# Patient Record
Sex: Female | Born: 1935 | Race: Black or African American | Hispanic: No | Smoking: Former smoker
Health system: Southern US, Community
[De-identification: ages and names within clinical notes are randomized; demographics above are authoritative.]

## PROBLEM LIST (undated history)

## (undated) DIAGNOSIS — I739 Peripheral vascular disease, unspecified: Secondary | ICD-10-CM

## (undated) DIAGNOSIS — E785 Hyperlipidemia, unspecified: Secondary | ICD-10-CM

## (undated) DIAGNOSIS — O288 Other abnormal findings on antenatal screening of mother: Secondary | ICD-10-CM

## (undated) DIAGNOSIS — C50911 Malignant neoplasm of unspecified site of right female breast: Secondary | ICD-10-CM

## (undated) DIAGNOSIS — E119 Type 2 diabetes mellitus without complications: Secondary | ICD-10-CM

## (undated) DIAGNOSIS — K219 Gastro-esophageal reflux disease without esophagitis: Secondary | ICD-10-CM

## (undated) DIAGNOSIS — H9190 Unspecified hearing loss, unspecified ear: Secondary | ICD-10-CM

## (undated) DIAGNOSIS — Z9989 Dependence on other enabling machines and devices: Secondary | ICD-10-CM

## (undated) DIAGNOSIS — M1990 Unspecified osteoarthritis, unspecified site: Secondary | ICD-10-CM

## (undated) DIAGNOSIS — I10 Essential (primary) hypertension: Secondary | ICD-10-CM

## (undated) DIAGNOSIS — Z720 Tobacco use: Secondary | ICD-10-CM

## (undated) DIAGNOSIS — G4733 Obstructive sleep apnea (adult) (pediatric): Secondary | ICD-10-CM

## (undated) DIAGNOSIS — R413 Other amnesia: Secondary | ICD-10-CM

## (undated) HISTORY — DX: Hyperlipidemia, unspecified: E78.5

## (undated) HISTORY — DX: Peripheral vascular disease, unspecified: I73.9

## (undated) HISTORY — DX: Other amnesia: R41.3

## (undated) HISTORY — PX: JOINT REPLACEMENT: SHX530

## (undated) HISTORY — PX: CATARACT EXTRACTION, BILATERAL: SHX1313

## (undated) HISTORY — DX: Tobacco use: Z72.0

## (undated) HISTORY — PX: APPENDECTOMY: SHX54

## (undated) HISTORY — DX: Other abnormal findings on antenatal screening of mother: O28.8

## (undated) HISTORY — DX: Unspecified hearing loss, unspecified ear: H91.90

---

## 1989-03-07 DIAGNOSIS — C50911 Malignant neoplasm of unspecified site of right female breast: Secondary | ICD-10-CM

## 1989-03-07 HISTORY — PX: BREAST BIOPSY: SHX20

## 1989-03-07 HISTORY — PX: MASTECTOMY PARTIAL / LUMPECTOMY W/ AXILLARY LYMPHADENECTOMY: SUR852

## 1989-03-07 HISTORY — DX: Malignant neoplasm of unspecified site of right female breast: C50.911

## 1997-09-10 ENCOUNTER — Encounter: Admission: RE | Admit: 1997-09-10 | Discharge: 1997-12-09 | Payer: Self-pay | Admitting: General Surgery

## 1997-09-22 ENCOUNTER — Emergency Department (HOSPITAL_COMMUNITY): Admission: EM | Admit: 1997-09-22 | Discharge: 1997-09-22 | Payer: Self-pay | Admitting: Emergency Medicine

## 1997-10-24 ENCOUNTER — Ambulatory Visit (HOSPITAL_COMMUNITY): Admission: RE | Admit: 1997-10-24 | Discharge: 1997-10-24 | Payer: Self-pay | Admitting: *Deleted

## 1997-12-15 ENCOUNTER — Ambulatory Visit (HOSPITAL_COMMUNITY): Admission: RE | Admit: 1997-12-15 | Discharge: 1997-12-15 | Payer: Self-pay | Admitting: General Surgery

## 1997-12-17 ENCOUNTER — Encounter: Admission: RE | Admit: 1997-12-17 | Discharge: 1998-03-17 | Payer: Self-pay

## 1998-12-30 ENCOUNTER — Ambulatory Visit (HOSPITAL_COMMUNITY): Admission: RE | Admit: 1998-12-30 | Discharge: 1998-12-30 | Payer: Self-pay | Admitting: *Deleted

## 1998-12-30 ENCOUNTER — Encounter: Payer: Self-pay | Admitting: *Deleted

## 1999-03-04 ENCOUNTER — Encounter: Payer: Self-pay | Admitting: Urology

## 1999-03-04 ENCOUNTER — Encounter: Admission: RE | Admit: 1999-03-04 | Discharge: 1999-03-04 | Payer: Self-pay | Admitting: Urology

## 1999-06-24 ENCOUNTER — Ambulatory Visit (HOSPITAL_COMMUNITY): Admission: RE | Admit: 1999-06-24 | Discharge: 1999-06-24 | Payer: Self-pay | Admitting: *Deleted

## 1999-06-24 ENCOUNTER — Encounter: Payer: Self-pay | Admitting: *Deleted

## 1999-11-15 ENCOUNTER — Encounter: Admission: RE | Admit: 1999-11-15 | Discharge: 2000-02-13 | Payer: Self-pay | Admitting: Internal Medicine

## 2000-04-17 ENCOUNTER — Encounter: Admission: RE | Admit: 2000-04-17 | Discharge: 2000-07-16 | Payer: Self-pay | Admitting: Internal Medicine

## 2000-07-14 ENCOUNTER — Encounter (INDEPENDENT_AMBULATORY_CARE_PROVIDER_SITE_OTHER): Payer: Self-pay | Admitting: *Deleted

## 2000-07-14 ENCOUNTER — Ambulatory Visit (HOSPITAL_COMMUNITY): Admission: RE | Admit: 2000-07-14 | Discharge: 2000-07-14 | Payer: Self-pay | Admitting: Gastroenterology

## 2001-04-02 ENCOUNTER — Encounter: Admission: RE | Admit: 2001-04-02 | Discharge: 2001-04-02 | Payer: Self-pay | Admitting: Internal Medicine

## 2001-04-02 ENCOUNTER — Encounter: Payer: Self-pay | Admitting: Internal Medicine

## 2001-12-13 ENCOUNTER — Ambulatory Visit (HOSPITAL_COMMUNITY): Admission: RE | Admit: 2001-12-13 | Discharge: 2001-12-13 | Payer: Self-pay | Admitting: Internal Medicine

## 2001-12-13 ENCOUNTER — Encounter: Payer: Self-pay | Admitting: Internal Medicine

## 2002-11-15 ENCOUNTER — Encounter: Payer: Self-pay | Admitting: Emergency Medicine

## 2002-11-16 ENCOUNTER — Observation Stay (HOSPITAL_COMMUNITY): Admission: EM | Admit: 2002-11-16 | Discharge: 2002-11-16 | Payer: Self-pay | Admitting: Emergency Medicine

## 2003-05-12 ENCOUNTER — Encounter: Admission: RE | Admit: 2003-05-12 | Discharge: 2003-05-12 | Payer: Self-pay | Admitting: General Surgery

## 2004-05-22 ENCOUNTER — Emergency Department (HOSPITAL_COMMUNITY): Admission: EM | Admit: 2004-05-22 | Discharge: 2004-05-22 | Payer: Self-pay | Admitting: Emergency Medicine

## 2004-05-25 ENCOUNTER — Emergency Department (HOSPITAL_COMMUNITY): Admission: EM | Admit: 2004-05-25 | Discharge: 2004-05-25 | Payer: Self-pay | Admitting: Emergency Medicine

## 2005-03-11 ENCOUNTER — Encounter: Admission: RE | Admit: 2005-03-11 | Discharge: 2005-03-11 | Payer: Self-pay | Admitting: Internal Medicine

## 2005-06-21 ENCOUNTER — Encounter: Admission: RE | Admit: 2005-06-21 | Discharge: 2005-06-21 | Payer: Self-pay | Admitting: General Surgery

## 2005-11-29 ENCOUNTER — Encounter: Admission: RE | Admit: 2005-11-29 | Discharge: 2005-11-29 | Payer: Self-pay

## 2006-07-25 ENCOUNTER — Ambulatory Visit: Payer: Self-pay | Admitting: Vascular Surgery

## 2007-01-02 ENCOUNTER — Encounter: Admission: RE | Admit: 2007-01-02 | Discharge: 2007-01-02 | Payer: Self-pay | Admitting: Internal Medicine

## 2007-02-10 ENCOUNTER — Emergency Department (HOSPITAL_COMMUNITY): Admission: EM | Admit: 2007-02-10 | Discharge: 2007-02-10 | Payer: Self-pay | Admitting: Family Medicine

## 2007-09-01 ENCOUNTER — Emergency Department (HOSPITAL_COMMUNITY): Admission: EM | Admit: 2007-09-01 | Discharge: 2007-09-01 | Payer: Self-pay | Admitting: Emergency Medicine

## 2007-09-15 ENCOUNTER — Emergency Department (HOSPITAL_COMMUNITY): Admission: EM | Admit: 2007-09-15 | Discharge: 2007-09-15 | Payer: Self-pay | Admitting: Family Medicine

## 2007-12-25 ENCOUNTER — Encounter: Admission: RE | Admit: 2007-12-25 | Discharge: 2008-02-21 | Payer: Self-pay | Admitting: Internal Medicine

## 2009-01-28 ENCOUNTER — Ambulatory Visit (HOSPITAL_COMMUNITY): Admission: RE | Admit: 2009-01-28 | Discharge: 2009-01-28 | Payer: Self-pay | Admitting: Internal Medicine

## 2009-01-28 ENCOUNTER — Encounter (INDEPENDENT_AMBULATORY_CARE_PROVIDER_SITE_OTHER): Payer: Self-pay | Admitting: Internal Medicine

## 2009-01-28 ENCOUNTER — Ambulatory Visit: Payer: Self-pay | Admitting: Surgery

## 2009-03-07 HISTORY — PX: TOTAL HIP ARTHROPLASTY: SHX124

## 2009-07-24 ENCOUNTER — Encounter: Admission: RE | Admit: 2009-07-24 | Discharge: 2009-07-24 | Payer: Self-pay | Admitting: Internal Medicine

## 2009-11-02 ENCOUNTER — Inpatient Hospital Stay (HOSPITAL_COMMUNITY): Admission: RE | Admit: 2009-11-02 | Discharge: 2009-11-05 | Payer: Self-pay | Admitting: Orthopedic Surgery

## 2009-11-22 ENCOUNTER — Inpatient Hospital Stay (HOSPITAL_COMMUNITY): Admission: EM | Admit: 2009-11-22 | Discharge: 2009-11-25 | Payer: Self-pay | Admitting: Emergency Medicine

## 2009-11-24 ENCOUNTER — Ambulatory Visit: Payer: Self-pay | Admitting: Infectious Disease

## 2009-12-07 ENCOUNTER — Encounter: Payer: Self-pay | Admitting: Infectious Disease

## 2009-12-14 ENCOUNTER — Encounter: Payer: Self-pay | Admitting: Infectious Disease

## 2009-12-17 ENCOUNTER — Encounter: Payer: Self-pay | Admitting: Infectious Disease

## 2009-12-22 ENCOUNTER — Inpatient Hospital Stay (HOSPITAL_COMMUNITY): Admission: RE | Admit: 2009-12-22 | Discharge: 2009-12-24 | Payer: Self-pay | Admitting: Orthopedic Surgery

## 2009-12-29 ENCOUNTER — Encounter: Payer: Self-pay | Admitting: Infectious Disease

## 2009-12-31 ENCOUNTER — Emergency Department (HOSPITAL_COMMUNITY): Admission: EM | Admit: 2009-12-31 | Discharge: 2009-12-31 | Payer: Self-pay | Admitting: Emergency Medicine

## 2010-01-04 ENCOUNTER — Encounter: Payer: Self-pay | Admitting: Infectious Disease

## 2010-01-13 ENCOUNTER — Ambulatory Visit: Payer: Self-pay | Admitting: Infectious Disease

## 2010-01-13 DIAGNOSIS — IMO0002 Reserved for concepts with insufficient information to code with codable children: Secondary | ICD-10-CM | POA: Insufficient documentation

## 2010-01-13 DIAGNOSIS — M009 Pyogenic arthritis, unspecified: Secondary | ICD-10-CM | POA: Insufficient documentation

## 2010-01-13 DIAGNOSIS — J15211 Pneumonia due to Methicillin susceptible Staphylococcus aureus: Secondary | ICD-10-CM | POA: Insufficient documentation

## 2010-01-13 LAB — CONVERTED CEMR LAB
ALT: 14 units/L (ref 0–35)
AST: 15 units/L (ref 0–37)
Albumin: 4 g/dL (ref 3.5–5.2)
Alkaline Phosphatase: 86 units/L (ref 39–117)
BUN: 14 mg/dL (ref 6–23)
Basophils Absolute: 0 10*3/uL (ref 0.0–0.1)
Basophils Relative: 0 % (ref 0–1)
CO2: 23 meq/L (ref 19–32)
CRP: 1.2 mg/dL — ABNORMAL HIGH (ref <0.6)
Calcium: 9 mg/dL (ref 8.4–10.5)
Chloride: 108 meq/L (ref 96–112)
Creatinine, Ser: 0.73 mg/dL (ref 0.40–1.20)
Eosinophils Absolute: 0.1 10*3/uL (ref 0.0–0.7)
Eosinophils Relative: 2 % (ref 0–5)
Glucose, Bld: 84 mg/dL (ref 70–99)
HCT: 36.9 % (ref 36.0–46.0)
Hemoglobin: 11.4 g/dL — ABNORMAL LOW (ref 12.0–15.0)
Lymphocytes Relative: 35 % (ref 12–46)
Lymphs Abs: 1.4 10*3/uL (ref 0.7–4.0)
MCHC: 30.9 g/dL (ref 30.0–36.0)
MCV: 77.5 fL — ABNORMAL LOW (ref 78.0–100.0)
Monocytes Absolute: 0.3 10*3/uL (ref 0.1–1.0)
Monocytes Relative: 8 % (ref 3–12)
Neutro Abs: 2.3 10*3/uL (ref 1.7–7.7)
Neutrophils Relative %: 56 % (ref 43–77)
Platelets: 208 10*3/uL (ref 150–400)
Potassium: 4.6 meq/L (ref 3.5–5.3)
RBC: 4.76 M/uL (ref 3.87–5.11)
RDW: 18.7 % — ABNORMAL HIGH (ref 11.5–15.5)
Sed Rate: 41 mm/hr — ABNORMAL HIGH (ref 0–22)
Sodium: 141 meq/L (ref 135–145)
Total Bilirubin: 0.3 mg/dL (ref 0.3–1.2)
Total Protein: 7 g/dL (ref 6.0–8.3)
WBC: 4.1 10*3/uL (ref 4.0–10.5)

## 2010-02-16 ENCOUNTER — Ambulatory Visit: Payer: Self-pay | Admitting: Infectious Disease

## 2010-02-16 ENCOUNTER — Encounter: Payer: Self-pay | Admitting: Infectious Disease

## 2010-02-16 DIAGNOSIS — G2581 Restless legs syndrome: Secondary | ICD-10-CM | POA: Insufficient documentation

## 2010-02-16 LAB — CONVERTED CEMR LAB
BUN: 24 mg/dL — ABNORMAL HIGH (ref 6–23)
Basophils Absolute: 0 10*3/uL (ref 0.0–0.1)
Basophils Relative: 1 % (ref 0–1)
CO2: 20 meq/L (ref 19–32)
CRP: 0.1 mg/dL (ref <0.6)
Calcium: 9.5 mg/dL (ref 8.4–10.5)
Chloride: 110 meq/L (ref 96–112)
Creatinine, Ser: 0.97 mg/dL (ref 0.40–1.20)
Eosinophils Absolute: 0.1 10*3/uL (ref 0.0–0.7)
Eosinophils Relative: 2 % (ref 0–5)
Glucose, Bld: 65 mg/dL — ABNORMAL LOW (ref 70–99)
HCT: 38.7 % (ref 36.0–46.0)
Hemoglobin: 12.6 g/dL (ref 12.0–15.0)
Lymphocytes Relative: 49 % — ABNORMAL HIGH (ref 12–46)
Lymphs Abs: 2 10*3/uL (ref 0.7–4.0)
MCHC: 32.6 g/dL (ref 30.0–36.0)
MCV: 72.9 fL — ABNORMAL LOW (ref 78.0–100.0)
Monocytes Absolute: 0.2 10*3/uL (ref 0.1–1.0)
Monocytes Relative: 4 % (ref 3–12)
Neutro Abs: 1.8 10*3/uL (ref 1.7–7.7)
Neutrophils Relative %: 45 % (ref 43–77)
Platelets: 117 10*3/uL — ABNORMAL LOW (ref 150–400)
Potassium: 4.9 meq/L (ref 3.5–5.3)
RBC: 5.31 M/uL — ABNORMAL HIGH (ref 3.87–5.11)
RDW: 19 % — ABNORMAL HIGH (ref 11.5–15.5)
Sed Rate: 17 mm/hr (ref 0–22)
Sodium: 141 meq/L (ref 135–145)
WBC: 4.1 10*3/uL (ref 4.0–10.5)

## 2010-04-06 NOTE — Assessment & Plan Note (Signed)
Summary: [facility] NEED CHART/INF RT HIP   Vital Signs:  Patient profile:   77 year old female Height:      63 inches (160.02 cm) Weight:      162.13 pounds (73.70 kg) BMI:     28.82 Temp:     97.7 degrees F (36.50 degrees C) oral Pulse rate:   79 / minute BP sitting:   144 / 77  (left arm)  Vitals Entered By: Starleen Arms CMA (January 13, 2010 10:04 AM) CC: hsfu hip infection Is Patient Diabetic? Yes Did you bring your meter with you today? No Pain Assessment Patient in pain? no      Nutritional Status BMI of 25 - 29 = overweight Nutritional Status Detail nl  Does patient need assistance? Functional Status Self care Ambulation Normal   Visit Type:  Follow-up Referring Provider:  Lajoyce Corners Primary Provider:  Dorothyann Peng  CC:  hsfu hip infection.  History of Present Illness:  77 year old African  American female with past medical history significant for diabetes,   hypertension, osteoarthritis.  Underwent right total knee hip  replacement by Dr. Barry Dienes on August 30.  She had been doing well and was  discharged on September 1.  However, approximately a week ago, she  developed swelling at the incision site with pain and fever.  She was   having difficulty walking and it became so severe that she came to the  emergency room for evaluation.  She was found in the ER to be febrile  and had swelling of the hip.  She was seen by orthopedic surgery who did  aspiration of the hip which revealed murky fluid with multiple white   blood cells.  She was brought to the operating room and underwent  incision and debridement of abscess where copious murky material was  found as well as necrotic material.  According to Dr. Dub Mikes operative  note this fluid was largely above the fascia, does not appear that   debridement was carried out into the actual hip joint.  Anaerobic  cultures have grown methicillin-sensitive staph aureus.  The patient is  allergic to penicillin which causes welts.   She has not been on  cephalosporins or other  penicillins. She was treated with IV vancomycin and oral rifampin but apparently had GI upset with rifampin and went to vancomycin alone. Her PICC line malfunctioned and my partner Dr. Maurice March had it removed. Dr. Charlann Boxer saw her and promptly put her back on oral antibiotics TMP/SMX on which she continues at present. She is doing well and denies fevers, chills, nausea or malaise. She has no pain at hip site with weight bearing or otherwise.Dr. Charlann Boxer, Dr Dorothyann Peng on [place]  Current Medications (verified): 1)  Bactrim Ds 800-160 Mg Tabs (Sulfamethoxazole-Trimethoprim) .... Take 1 Tablet By Mouth Two Times A Day  Allergies (verified): 1)  ! Penicillin  Past History:  Past Medical History:    1. Osteoarthritis.   2. Hypertension.   3. Diabetes mellitus.  4/ septic prosthetic hip with MSSA    She did not bring meds but DC summary listed the following absent the vanco and rifampin   . Acetaminophen 325 mg 2 every 4 hours as needed.   2. Colace 100 mg twice a day.   3. Ferrous sulfate 325 mg 3 times a day.   4. Hydrocodone 7.5/325 mg 1-2 p.o. q.4-6 hours p.r.n. pain.   5. Robaxin 500 mg every 6 hours as needed.   6. MiraLax  17 g a day as needed.  . Albuterol as needed 4 times day.   9. Alendronate 70 mg weekly.   10.Artificial tears as needed.   11.Aspirin 81 mg will be resumed after her 325 mg for 6 weeks.   12.Crestor 5 mg daily.   13.Diovan 160 mg every morning.   14.Iron 3 times a day for 3 weeks.   15.Metformin XR 500 mg twice a day.  Spiriva 18 mcg every morning.   18.Victoza 18 units every evening.      Past Surgical History:  1. History of hip replacement as described above   2. I and D as described above.   3. History of appendectomy.   4. History of the tubal ligation.   Family History: noncontributory  Social History: smokes 5 cigarettes per day no etoh  Physical Exam  General:  alert and well-hydrated.   Head:   normocephalic and atraumatic.   Eyes:  vision grossly intact, pupils equal, and pupils round.   Ears:  no external deformities and ear piercing(s) noted.   Nose:  no external deformity and no external erythema.   Mouth:  pharynx pink and moist, no erythema, and no exudates.   Neck:  supple and full ROM.   Lungs:  normal respiratory effort, no crackles, and no wheezes.   Heart:  normal rate, regular rhythm, no murmur, no gallop, and no rub.   Abdomen:  soft, non-tender, normal bowel sounds, and no distention.   Msk:  see skin Extremities:  1+ left pedal edema and 1+ right pedal edema.   Neurologic:  alert & oriented X3 and gait normal.   Skin:  pts hip site well healed, warm with some thickening induration of skin but no fluctuance Psych:  Oriented X3 and normally interactive.     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 She did  not tolerate the rifampin well apparently Her updated medication list for this problem includes:    Bactrim Ds 800-160 Mg Tabs (Sulfamethoxazole-trimethoprim) .Marland Kitchen... Take 1 tablet by mouth two times a day  Orders: T-CMP with estimated GFR ([address]) T-CBC w/Diff (40981-19147) T-C-Reactive Protein ([id number]) T-Sed Rate (Automated) (65784-69629) Est. Patient Level IV (52841)  Problem # 2:  METHICILLIN SUSECPTIBLE PNEUMONIA STAPH AUREUS (ICD-482.41)  see above Her updated medication list for this problem includes:    Bactrim Ds 800-160 Mg Tabs (Sulfamethoxazole-trimethoprim) .Marland Kitchen... Take 1 tablet by mouth two times a day  Orders: Est. Patient Level IV (32440)  Problem # 3:  HIP THI LEG&ANK ABRASION/FRICION BURN W/O INF (ICD-916.0)  see above  Orders: Est. Patient Level IV (10272)  Medications Added to Medication List This Visit: 1)  Bactrim Ds 800-160 Mg Tabs (Sulfamethoxazole-trimethoprim) .... Take 1 tablet by mouth two times a  day  Patient Instructions: 1)  rtc to see Dr. Daiva Eves in one month Prescriptions: BACTRIM DS 800-160 MG TABS (SULFAMETHOXAZOLE-TRIMETHOPRIM) Take 1 tablet by mouth two times a day  #60 x 11   Entered and Authorized by:   Acey Lav MD   Signed by:   Paulette Blanch Dam MD on 01/13/2010   Method used:   Electronically to        [facility] (retail)       [address]       [place], [place]  [id number]       Ph: [phone]  Fax: [phone]   RxID:   [id number]    Orders Added: 1)  T-CMP with estimated GFR [[address]] 2)  T-CBC w/Diff [84696-29528] 3)  T-C-Reactive Protein [41324-40102] 4)  T-Sed Rate (Automated) [72536-64403] 5)  Est. Patient Level IV [47425]

## 2010-04-06 NOTE — Miscellaneous (Signed)
Summary: HIPAA Restrictions  HIPAA Restrictions   Imported By: Florinda Marker 01/18/2010 14:46:33  _____________________________________________________________________  External Attachment:    Type:   Image     Comment:   External Document

## 2010-04-08 NOTE — Assessment & Plan Note (Signed)
Summary: 62month f/u [mkj]   Visit Type:  Follow-up Referring Provider:  Lajoyce Corners Primary Provider:  Dorothyann Peng  CC:  f/u .  History of Present Illness:  77 year old African  American female with past medical history significant for diabetes,   hypertension, osteoarthritis.  Underwent right total knee hip  replacement by Dr. Barry Dienes on November 03, 2009 She had been doing well and was  discharged on September 1.  However, approximately a week ago, she  developed swelling at the incision site with pain and fever.  She was   having difficulty walking and it became so severe that she came to the  emergency room for evaluation.  She was found in the ER to be febrile  and had swelling of the hip.  She was seen by orthopedic surgery who did  aspiration of the hip which revealed murky fluid with multiple white   blood cells.  She was brought to the operating room and underwent  incision and debridement of abscess where copious murky material was  found as well as necrotic material.  According to Dr. Dub Mikes operative  note this fluid was largely above the fascia, does not appear that   debridement was carried out into the actual hip joint.  cultures have grown methicillin-sensitive staph aureus.  The patient is  allergic to penicillin which causes welts.  She has not been on  cephalosporins or other  penicillins. She was treated with IV vancomycin and oral rifampin but apparently had GI upset with rifampin and went to vancomycin alone. Her PICC line malfunctioned and my partner Dr. Maurice March had it removed. Dr. Charlann Boxer saw her and promptly put her back on oral antibiotics TMP/SMX on which she continues at present. I saw her last month and she was doing well and esr was 43. She continues to do well and is without hip pain, fevers or malaise. She doe shave bilateral lower leg pain at night.  Problems Prior to Update: 1)  Septic Arthritis  (ICD-711.00) 2)  Hip Thi Leg&ank Abrasion/fricion Burn w/o Inf   (ICD-916.0) 3)  Methicillin Susecptible Pneumonia Staph Aureus  (ICD-482.41)  Medications Prior to Update: 1)  Bactrim Ds 800-160 Mg Tabs (Sulfamethoxazole-Trimethoprim) .... Take 1 Tablet By Mouth Two Times A Day  Current Medications (verified): 1)  Bactrim Ds 800-160 Mg Tabs (Sulfamethoxazole-Trimethoprim) .... Take 1 Tablet By Mouth Two Times A Day  Allergies: 1)  ! Penicillin    Current Allergies (reviewed today): ! PENICILLIN Past History:  Past Medical History: Last updated: 01/13/2010    1. Osteoarthritis.   2. Hypertension.   3. Diabetes mellitus.  4/ septic prosthetic hip with MSSA    She did not bring meds but DC summary listed the following absent the vanco and rifampin   . Acetaminophen 325 mg 2 every 4 hours as needed.   2. Colace 100 mg twice a day.   3. Ferrous sulfate 325 mg 3 times a day.   4. Hydrocodone 7.5/325 mg 1-2 p.o. q.4-6 hours p.r.n. pain.   5. Robaxin 500 mg every 6 hours as needed.   6. MiraLax 17 g a day as needed.  . Albuterol as needed 4 times day.   9. Alendronate 70 mg weekly.   10.Artificial tears as needed.   11.Aspirin 81 mg will be resumed after her 325 mg for 6 weeks.   12.Crestor 5 mg daily.   13.Diovan 160 mg every morning.   14.Iron 3 times a day for 3 weeks.  15.Metformin XR 500 mg twice a day.  Spiriva 18 mcg every morning.   18.Victoza 18 units every evening.      Past Surgical History: Last updated: 01/13/2010  1. History of hip replacement as described above   2. I and D as described above.   3. History of appendectomy.   4. History of the tubal ligation.   Family History: Last updated: 01/13/2010 noncontributory  Social History: Last updated: 01/13/2010 smokes 5 cigarettes per day no etoh  Family History: Reviewed history from 01/13/2010 and no changes required. noncontributory  Social History: Reviewed history from 01/13/2010 and no changes required. smokes 5 cigarettes per day no etoh  Review of  Systems  The patient denies anorexia, fever, weight loss, weight gain, vision loss, decreased hearing, hoarseness, chest pain, syncope, dyspnea on exertion, peripheral edema, prolonged cough, headaches, hemoptysis, abdominal pain, melena, hematochezia, severe indigestion/heartburn, hematuria, incontinence, genital sores, muscle weakness, suspicious skin lesions, transient blindness, difficulty walking, depression, unusual weight change, abnormal bleeding, enlarged lymph nodes, and angioedema.    Vital Signs:  Patient profile:   77 year old female Height:      63 inches (160.02 cm) Weight:      155 pounds (70.45 kg) BMI:     27.56 Temp:     98.1 degrees F (36.72 degrees C) oral Pulse rate:   71 / minute BP sitting:   129 / 60  (left arm)  Vitals Entered By: Starleen Arms CMA (February 16, 2010 9:41 AM) CC: f/u  Is Patient Diabetic? Yes Did you bring your meter with you today? No Pain Assessment Patient in pain? no      Nutritional Status BMI of 25 - 29 = overweight  Does patient need assistance? Functional Status Self care Ambulation Normal   Physical Exam  General:  alert and well-hydrated.   Head:  normocephalic and atraumatic.   Eyes:  vision grossly intact, pupils equal, and pupils round.   Ears:  no external deformities and ear piercing(s) noted.   Nose:  no external deformity and no external erythema.   Mouth:  pharynx pink and moist, no erythema, and no exudates.   Neck:  supple and full ROM.   Lungs:  normal respiratory effort, no crackles, and no wheezes.   Heart:  normal rate, regular rhythm, no murmur, no gallop, and no rub.   Abdomen:  soft, non-tender, normal bowel sounds, and no distention.   Msk:  see skin Extremities:  1+ left pedal edema and 1+ right pedal edema.   Neurologic:  alert & oriented X3 and gait normal.  strength normal in all extremities.     Impression & Recommendations:  Problem # 1:  SEPTIC ARTHRITIS (ICD-711.00) recheck her labs,  continue her tmp/smx Her updated medication list for this problem includes:    Bactrim Ds 800-160 Mg Tabs (Sulfamethoxazole-trimethoprim) .Marland Kitchen... Take 1 tablet by mouth two times a day  Orders: T-Basic Metabolic Panel [phone]) T-CBC w/Diff [phone]) T-C-Reactive Protein [phone]) T-Sed Rate (Automated) [phone]) Est. Patient Level IV (01093)  Problem # 2:  seeMETHICILLIN SUSECPTIBLE PNEUMONIA STAPH AUREUS (ICD-482.41)  see above Her updated medication list for this problem includes:    Bactrim Ds 800-160 Mg Tabs (Sulfamethoxazole-trimethoprim) .Marland Kitchen... Take 1 tablet by mouth two times a day  Orders: Est. Patient Level IV (23557)  Problem # 3:  RESTLESS LEG SYNDROME (ICD-333.94)  coudl she have this, she had abis to look for pVD --I do not have access to them at this time  Orders:  Est. Patient Level IV (16109)  Other Orders: Influenza Vaccine MCR (60454)  Patient Instructions: 1)  we will give you flu shot 2)  continue your bactrim 3)  rtc in 4 months    Immunizations Administered:  Influenza Vaccine # 1:    Vaccine Type: Fluvax MCR    Site: left deltoid    Mfr: novartis    Dose: 0.5 ml    Route: IM    Given by: Starleen Arms CMA    Exp. Date: 06/06/2010    Lot #: [id number]    VIS given: 09/29/09 version given February 16, 2010.  Flu Vaccine Consent Questions:    Do you have a history of severe allergic reactions to this vaccine? no    Any prior history of allergic reactions to egg and/or gelatin? no    Do you have a sensitivity to the preservative Thimersol? no    Do you have a past history of Guillan-Barre Syndrome? no    Do you currently have an acute febrile illness? no    Have you ever had a severe reaction to latex? no    Vaccine information given and explained to patient? yes    Are you currently pregnant? no

## 2010-05-13 ENCOUNTER — Encounter: Payer: Self-pay | Admitting: Licensed Clinical Social Worker

## 2010-05-19 LAB — BASIC METABOLIC PANEL
BUN: 11 mg/dL (ref 6–23)
BUN: 9 mg/dL (ref 6–23)
CO2: 26 mEq/L (ref 19–32)
CO2: 28 mEq/L (ref 19–32)
Calcium: 8 mg/dL — ABNORMAL LOW (ref 8.4–10.5)
Calcium: 8.4 mg/dL (ref 8.4–10.5)
Chloride: 109 mEq/L (ref 96–112)
Chloride: 111 mEq/L (ref 96–112)
Creatinine, Ser: 0.71 mg/dL (ref 0.4–1.2)
Creatinine, Ser: 0.8 mg/dL (ref 0.4–1.2)
GFR calc Af Amer: >60 mL/min (ref >60)
GFR calc Af Amer: >60 mL/min (ref >60)
GFR calc non Af Amer: >60 mL/min (ref >60)
GFR calc non Af Amer: >60 mL/min (ref >60)
Glucose, Bld: 148 mg/dL — ABNORMAL HIGH (ref 70–99)
Glucose, Bld: 81 mg/dL (ref 70–99)
Potassium: 3.3 mEq/L — ABNORMAL LOW (ref 3.5–5.1)
Potassium: 3.4 mEq/L — ABNORMAL LOW (ref 3.5–5.1)
Sodium: 141 mEq/L (ref 135–145)
Sodium: 142 mEq/L (ref 135–145)

## 2010-05-19 LAB — GLUCOSE, CAPILLARY
Glucose-Capillary: 105 mg/dL — ABNORMAL HIGH (ref 70–99)
Glucose-Capillary: 112 mg/dL — ABNORMAL HIGH (ref 70–99)
Glucose-Capillary: 135 mg/dL — ABNORMAL HIGH (ref 70–99)
Glucose-Capillary: 74 mg/dL (ref 70–99)
Glucose-Capillary: 79 mg/dL (ref 70–99)
Glucose-Capillary: 80 mg/dL (ref 70–99)
Glucose-Capillary: 80 mg/dL (ref 70–99)
Glucose-Capillary: 85 mg/dL (ref 70–99)
Glucose-Capillary: 85 mg/dL (ref 70–99)
Glucose-Capillary: 90 mg/dL (ref 70–99)
Glucose-Capillary: 92 mg/dL (ref 70–99)

## 2010-05-19 LAB — CBC
HCT: 27.3 % — ABNORMAL LOW (ref 36.0–46.0)
HCT: 27.7 % — ABNORMAL LOW (ref 36.0–46.0)
Hemoglobin: 9 g/dL — ABNORMAL LOW (ref 12.0–15.0)
Hemoglobin: 9 g/dL — ABNORMAL LOW (ref 12.0–15.0)
MCH: 25.6 pg — ABNORMAL LOW (ref 26.0–34.0)
MCH: 25.7 pg — ABNORMAL LOW (ref 26.0–34.0)
MCHC: 32.6 g/dL (ref 30.0–36.0)
MCHC: 33 g/dL (ref 30.0–36.0)
MCV: 77.8 fL — ABNORMAL LOW (ref 78.0–100.0)
MCV: 78.6 fL (ref 78.0–100.0)
Platelets: 111 10*3/uL — ABNORMAL LOW (ref 150–400)
Platelets: 132 10*3/uL — ABNORMAL LOW (ref 150–400)
RBC: 3.51 MIL/uL — ABNORMAL LOW (ref 3.87–5.11)
RBC: 3.53 MIL/uL — ABNORMAL LOW (ref 3.87–5.11)
RDW: 19.3 % — ABNORMAL HIGH (ref 11.5–15.5)
RDW: 19.8 % — ABNORMAL HIGH (ref 11.5–15.5)
WBC: 5.3 10*3/uL (ref 4.0–10.5)
WBC: 5.9 10*3/uL (ref 4.0–10.5)

## 2010-05-19 LAB — ANAEROBIC CULTURE

## 2010-05-19 LAB — WOUND CULTURE: Culture: NO GROWTH

## 2010-05-19 LAB — GRAM STAIN

## 2010-05-20 LAB — CBC
HCT: 32.3 % — ABNORMAL LOW (ref 36.0–46.0)
HCT: 24.8 % — ABNORMAL LOW (ref 36.0–46.0)
HCT: 25.8 % — ABNORMAL LOW (ref 36.0–46.0)
HCT: 26.7 % — ABNORMAL LOW (ref 36.0–46.0)
HCT: 33 % — ABNORMAL LOW (ref 36.0–46.0)
HCT: 33.2 % — ABNORMAL LOW (ref 36.0–46.0)
Hemoglobin: 10.5 g/dL — ABNORMAL LOW (ref 12.0–15.0)
Hemoglobin: 10.8 g/dL — ABNORMAL LOW (ref 12.0–15.0)
Hemoglobin: 11 g/dL — ABNORMAL LOW (ref 12.0–15.0)
Hemoglobin: 8 g/dL — ABNORMAL LOW (ref 12.0–15.0)
Hemoglobin: 8.2 g/dL — ABNORMAL LOW (ref 12.0–15.0)
Hemoglobin: 8.7 g/dL — ABNORMAL LOW (ref 12.0–15.0)
MCH: 25.5 pg — ABNORMAL LOW (ref 26.0–34.0)
MCH: 24.6 pg — ABNORMAL LOW (ref 26.0–34.0)
MCH: 25 pg — ABNORMAL LOW (ref 26.0–34.0)
MCH: 25.1 pg — ABNORMAL LOW (ref 26.0–34.0)
MCH: 25.3 pg — ABNORMAL LOW (ref 26.0–34.0)
MCH: 25.6 pg — ABNORMAL LOW (ref 26.0–34.0)
MCHC: 32.3 g/dL (ref 30.0–36.0)
MCHC: 31.8 g/dL (ref 30.0–36.0)
MCHC: 32.3 g/dL (ref 30.0–36.0)
MCHC: 32.6 g/dL (ref 30.0–36.0)
MCHC: 32.7 g/dL (ref 30.0–36.0)
MCHC: 33.1 g/dL (ref 30.0–36.0)
MCV: 78.9 fL (ref 78.0–100.0)
MCV: 76.6 fL — ABNORMAL LOW (ref 78.0–100.0)
MCV: 77.2 fL — ABNORMAL LOW (ref 78.0–100.0)
MCV: 77.4 fL — ABNORMAL LOW (ref 78.0–100.0)
MCV: 77.5 fL — ABNORMAL LOW (ref 78.0–100.0)
MCV: 77.6 fL — ABNORMAL LOW (ref 78.0–100.0)
Platelets: 194 10*3/uL (ref 150–400)
Platelets: 175 10*3/uL (ref 150–400)
Platelets: 205 10*3/uL (ref 150–400)
Platelets: 211 10*3/uL (ref 150–400)
Platelets: 215 10*3/uL (ref 150–400)
Platelets: 297 10*3/uL (ref 150–400)
RBC: 4.1 MIL/uL (ref 3.87–5.11)
RBC: 3.2 MIL/uL — ABNORMAL LOW (ref 3.87–5.11)
RBC: 3.34 MIL/uL — ABNORMAL LOW (ref 3.87–5.11)
RBC: 3.44 MIL/uL — ABNORMAL LOW (ref 3.87–5.11)
RBC: 4.29 MIL/uL (ref 3.87–5.11)
RBC: 4.31 MIL/uL (ref 3.87–5.11)
RDW: 19.5 % — ABNORMAL HIGH (ref 11.5–15.5)
RDW: 16.6 % — ABNORMAL HIGH (ref 11.5–15.5)
RDW: 16.8 % — ABNORMAL HIGH (ref 11.5–15.5)
RDW: 16.8 % — ABNORMAL HIGH (ref 11.5–15.5)
RDW: 16.9 % — ABNORMAL HIGH (ref 11.5–15.5)
RDW: 17 % — ABNORMAL HIGH (ref 11.5–15.5)
WBC: 5.4 10*3/uL (ref 4.0–10.5)
WBC: 13.7 10*3/uL — ABNORMAL HIGH (ref 4.0–10.5)
WBC: 15.9 10*3/uL — ABNORMAL HIGH (ref 4.0–10.5)
WBC: 6 10*3/uL (ref 4.0–10.5)
WBC: 6.8 10*3/uL (ref 4.0–10.5)
WBC: 9.6 10*3/uL (ref 4.0–10.5)

## 2010-05-20 LAB — GLUCOSE, CAPILLARY
Glucose-Capillary: 105 mg/dL — ABNORMAL HIGH (ref 70–99)
Glucose-Capillary: 107 mg/dL — ABNORMAL HIGH (ref 70–99)
Glucose-Capillary: 110 mg/dL — ABNORMAL HIGH (ref 70–99)
Glucose-Capillary: 113 mg/dL — ABNORMAL HIGH (ref 70–99)
Glucose-Capillary: 118 mg/dL — ABNORMAL HIGH (ref 70–99)
Glucose-Capillary: 124 mg/dL — ABNORMAL HIGH (ref 70–99)
Glucose-Capillary: 126 mg/dL — ABNORMAL HIGH (ref 70–99)
Glucose-Capillary: 139 mg/dL — ABNORMAL HIGH (ref 70–99)
Glucose-Capillary: 140 mg/dL — ABNORMAL HIGH (ref 70–99)
Glucose-Capillary: 151 mg/dL — ABNORMAL HIGH (ref 70–99)
Glucose-Capillary: 153 mg/dL — ABNORMAL HIGH (ref 70–99)
Glucose-Capillary: 159 mg/dL — ABNORMAL HIGH (ref 70–99)
Glucose-Capillary: 159 mg/dL — ABNORMAL HIGH (ref 70–99)
Glucose-Capillary: 176 mg/dL — ABNORMAL HIGH (ref 70–99)
Glucose-Capillary: 224 mg/dL — ABNORMAL HIGH (ref 70–99)
Glucose-Capillary: 90 mg/dL (ref 70–99)
Glucose-Capillary: 113 mg/dL — ABNORMAL HIGH (ref 70–99)
Glucose-Capillary: 126 mg/dL — ABNORMAL HIGH (ref 70–99)

## 2010-05-20 LAB — PROTIME-INR
INR: 1.07 (ref 0.00–1.49)
Prothrombin Time: 14.1 seconds (ref 11.6–15.2)

## 2010-05-20 LAB — URINALYSIS, ROUTINE W REFLEX MICROSCOPIC
Bilirubin Urine: NEGATIVE
Bilirubin Urine: NEGATIVE
Glucose, UA: NEGATIVE mg/dL
Glucose, UA: NEGATIVE mg/dL
Hgb urine dipstick: NEGATIVE
Ketones, ur: 15 mg/dL — AB
Nitrite: NEGATIVE
Nitrite: NEGATIVE
Protein, ur: NEGATIVE mg/dL
Protein, ur: NEGATIVE mg/dL
Specific Gravity, Urine: 1.028 (ref 1.005–1.030)
Specific Gravity, Urine: 1.019 (ref 1.005–1.030)
Urobilinogen, UA: 0.2 mg/dL (ref 0.0–1.0)
Urobilinogen, UA: 1 mg/dL (ref 0.0–1.0)
pH: 5.5 (ref 5.0–8.0)
pH: 6 (ref 5.0–8.0)

## 2010-05-20 LAB — BASIC METABOLIC PANEL
BUN: 11 mg/dL (ref 6–23)
BUN: 10 mg/dL (ref 6–23)
BUN: 11 mg/dL (ref 6–23)
BUN: 11 mg/dL (ref 6–23)
BUN: 8 mg/dL (ref 6–23)
BUN: 9 mg/dL (ref 6–23)
CO2: 27 mEq/L (ref 19–32)
CO2: 24 mEq/L (ref 19–32)
CO2: 25 mEq/L (ref 19–32)
CO2: 26 mEq/L (ref 19–32)
CO2: 26 mEq/L (ref 19–32)
CO2: 26 mEq/L (ref 19–32)
Calcium: 8.7 mg/dL (ref 8.4–10.5)
Calcium: 7.7 mg/dL — ABNORMAL LOW (ref 8.4–10.5)
Calcium: 8.1 mg/dL — ABNORMAL LOW (ref 8.4–10.5)
Calcium: 8.2 mg/dL — ABNORMAL LOW (ref 8.4–10.5)
Calcium: 8.8 mg/dL (ref 8.4–10.5)
Calcium: 9 mg/dL (ref 8.4–10.5)
Chloride: 110 mEq/L (ref 96–112)
Chloride: 102 mEq/L (ref 96–112)
Chloride: 105 mEq/L (ref 96–112)
Chloride: 107 mEq/L (ref 96–112)
Chloride: 111 mEq/L (ref 96–112)
Chloride: 112 mEq/L (ref 96–112)
Creatinine, Ser: 0.72 mg/dL (ref 0.4–1.2)
Creatinine, Ser: 0.55 mg/dL (ref 0.4–1.2)
Creatinine, Ser: 0.64 mg/dL (ref 0.4–1.2)
Creatinine, Ser: 0.71 mg/dL (ref 0.4–1.2)
Creatinine, Ser: 0.71 mg/dL (ref 0.4–1.2)
Creatinine, Ser: 0.71 mg/dL (ref 0.4–1.2)
GFR calc Af Amer: >60 mL/min (ref >60)
GFR calc Af Amer: >60 mL/min (ref >60)
GFR calc Af Amer: >60 mL/min (ref >60)
GFR calc Af Amer: >60 mL/min (ref >60)
GFR calc Af Amer: >60 mL/min (ref >60)
GFR calc Af Amer: >60 mL/min (ref >60)
GFR calc non Af Amer: >60 mL/min (ref >60)
GFR calc non Af Amer: >60 mL/min (ref >60)
GFR calc non Af Amer: >60 mL/min (ref >60)
GFR calc non Af Amer: >60 mL/min (ref >60)
GFR calc non Af Amer: >60 mL/min (ref >60)
GFR calc non Af Amer: >60 mL/min (ref >60)
Glucose, Bld: 85 mg/dL (ref 70–99)
Glucose, Bld: 102 mg/dL — ABNORMAL HIGH (ref 70–99)
Glucose, Bld: 111 mg/dL — ABNORMAL HIGH (ref 70–99)
Glucose, Bld: 122 mg/dL — ABNORMAL HIGH (ref 70–99)
Glucose, Bld: 146 mg/dL — ABNORMAL HIGH (ref 70–99)
Glucose, Bld: 93 mg/dL (ref 70–99)
Potassium: 3.7 mEq/L (ref 3.5–5.1)
Potassium: 3.6 mEq/L (ref 3.5–5.1)
Potassium: 3.7 mEq/L (ref 3.5–5.1)
Potassium: 3.8 mEq/L (ref 3.5–5.1)
Potassium: 3.9 mEq/L (ref 3.5–5.1)
Potassium: 3.9 mEq/L (ref 3.5–5.1)
Sodium: 144 mEq/L (ref 135–145)
Sodium: 136 mEq/L (ref 135–145)
Sodium: 138 mEq/L (ref 135–145)
Sodium: 141 mEq/L (ref 135–145)
Sodium: 141 mEq/L (ref 135–145)
Sodium: 142 mEq/L (ref 135–145)

## 2010-05-20 LAB — URINE MICROSCOPIC-ADD ON

## 2010-05-20 LAB — DIFFERENTIAL
Basophils Absolute: 0 10*3/uL (ref 0.0–0.1)
Basophils Absolute: 0 10*3/uL (ref 0.0–0.1)
Basophils Relative: 0 % (ref 0–1)
Basophils Relative: 0 % (ref 0–1)
Eosinophils Absolute: 0.2 10*3/uL (ref 0.0–0.7)
Eosinophils Absolute: 0 10*3/uL (ref 0.0–0.7)
Eosinophils Relative: 3 % (ref 0–5)
Eosinophils Relative: 0 % (ref 0–5)
Lymphocytes Relative: 21 % (ref 12–46)
Lymphocytes Relative: 9 % — ABNORMAL LOW (ref 12–46)
Lymphs Abs: 1.1 10*3/uL (ref 0.7–4.0)
Lymphs Abs: 1.2 10*3/uL (ref 0.7–4.0)
Monocytes Absolute: 0.5 10*3/uL (ref 0.1–1.0)
Monocytes Absolute: 0.9 10*3/uL (ref 0.1–1.0)
Monocytes Relative: 9 % (ref 3–12)
Monocytes Relative: 7 % (ref 3–12)
Neutro Abs: 3.5 10*3/uL (ref 1.7–7.7)
Neutro Abs: 11.6 10*3/uL — ABNORMAL HIGH (ref 1.7–7.7)
Neutrophils Relative %: 66 % (ref 43–77)
Neutrophils Relative %: 85 % — ABNORMAL HIGH (ref 43–77)

## 2010-05-20 LAB — GRAM STAIN

## 2010-05-20 LAB — APTT: aPTT: 31 seconds (ref 24–37)

## 2010-05-20 LAB — URINE CULTURE
Colony Count: NO GROWTH
Culture  Setup Time: 201109181843
Culture: NO GROWTH

## 2010-05-20 LAB — ANAEROBIC CULTURE

## 2010-05-20 LAB — HEMOGLOBIN A1C
Hgb A1c MFr Bld: 5.3 % (ref <5.7)
Mean Plasma Glucose: 105 mg/dL (ref <117)

## 2010-05-20 LAB — SURGICAL PCR SCREEN
MRSA, PCR: NEGATIVE
Staphylococcus aureus: POSITIVE — AB

## 2010-05-20 LAB — MAGNESIUM: Magnesium: 2 mg/dL (ref 1.5–2.5)

## 2010-05-20 LAB — CULTURE, ROUTINE-ABSCESS

## 2010-05-20 LAB — LACTIC ACID, PLASMA: Lactic Acid, Venous: 1.5 mmol/L (ref 0.5–2.2)

## 2010-05-20 LAB — SEDIMENTATION RATE: Sed Rate: 66 mm/hr — ABNORMAL HIGH (ref 0–22)

## 2010-05-21 LAB — URINALYSIS, ROUTINE W REFLEX MICROSCOPIC
Bilirubin Urine: NEGATIVE
Glucose, UA: NEGATIVE mg/dL
Ketones, ur: NEGATIVE mg/dL
Leukocytes, UA: NEGATIVE
Nitrite: NEGATIVE
Protein, ur: NEGATIVE mg/dL
Specific Gravity, Urine: 1.02 (ref 1.005–1.030)
Urobilinogen, UA: 0.2 mg/dL (ref 0.0–1.0)
pH: 6 (ref 5.0–8.0)

## 2010-05-21 LAB — CBC
HCT: 28.5 % — ABNORMAL LOW (ref 36.0–46.0)
HCT: 28.5 % — ABNORMAL LOW (ref 36.0–46.0)
HCT: 39.6 % (ref 36.0–46.0)
Hemoglobin: 13.1 g/dL (ref 12.0–15.0)
Hemoglobin: 9.4 g/dL — ABNORMAL LOW (ref 12.0–15.0)
Hemoglobin: 9.5 g/dL — ABNORMAL LOW (ref 12.0–15.0)
MCH: 26.6 pg (ref 26.0–34.0)
MCH: 26.8 pg (ref 26.0–34.0)
MCH: 26.9 pg (ref 26.0–34.0)
MCHC: 32.9 g/dL (ref 30.0–36.0)
MCHC: 33 g/dL (ref 30.0–36.0)
MCHC: 33.3 g/dL (ref 30.0–36.0)
MCV: 80.8 fL (ref 78.0–100.0)
MCV: 80.9 fL (ref 78.0–100.0)
MCV: 81.1 fL (ref 78.0–100.0)
Platelets: 114 10*3/uL — ABNORMAL LOW (ref 150–400)
Platelets: 117 10*3/uL — ABNORMAL LOW (ref 150–400)
Platelets: 151 10*3/uL (ref 150–400)
RBC: 3.52 MIL/uL — ABNORMAL LOW (ref 3.87–5.11)
RBC: 3.52 MIL/uL — ABNORMAL LOW (ref 3.87–5.11)
RBC: 4.89 MIL/uL (ref 3.87–5.11)
RDW: 16.6 % — ABNORMAL HIGH (ref 11.5–15.5)
RDW: 16.9 % — ABNORMAL HIGH (ref 11.5–15.5)
RDW: 17 % — ABNORMAL HIGH (ref 11.5–15.5)
WBC: 5.4 10*3/uL (ref 4.0–10.5)
WBC: 7.3 10*3/uL (ref 4.0–10.5)
WBC: 9.4 10*3/uL (ref 4.0–10.5)

## 2010-05-21 LAB — BASIC METABOLIC PANEL
BUN: 11 mg/dL (ref 6–23)
BUN: 7 mg/dL (ref 6–23)
BUN: 8 mg/dL (ref 6–23)
CO2: 25 mEq/L (ref 19–32)
CO2: 28 mEq/L (ref 19–32)
CO2: 29 mEq/L (ref 19–32)
Calcium: 8 mg/dL — ABNORMAL LOW (ref 8.4–10.5)
Calcium: 8.7 mg/dL (ref 8.4–10.5)
Calcium: 9.2 mg/dL (ref 8.4–10.5)
Chloride: 107 mEq/L (ref 96–112)
Chloride: 108 mEq/L (ref 96–112)
Chloride: 108 mEq/L (ref 96–112)
Creatinine, Ser: 0.66 mg/dL (ref 0.4–1.2)
Creatinine, Ser: 0.7 mg/dL (ref 0.4–1.2)
Creatinine, Ser: 0.74 mg/dL (ref 0.4–1.2)
GFR calc Af Amer: >60 mL/min (ref >60)
GFR calc Af Amer: >60 mL/min (ref >60)
GFR calc Af Amer: >60 mL/min (ref >60)
GFR calc non Af Amer: >60 mL/min (ref >60)
GFR calc non Af Amer: >60 mL/min (ref >60)
GFR calc non Af Amer: >60 mL/min (ref >60)
Glucose, Bld: 111 mg/dL — ABNORMAL HIGH (ref 70–99)
Glucose, Bld: 131 mg/dL — ABNORMAL HIGH (ref 70–99)
Glucose, Bld: 89 mg/dL (ref 70–99)
Potassium: 3.7 mEq/L (ref 3.5–5.1)
Potassium: 3.8 mEq/L (ref 3.5–5.1)
Potassium: 3.9 mEq/L (ref 3.5–5.1)
Sodium: 140 mEq/L (ref 135–145)
Sodium: 141 mEq/L (ref 135–145)
Sodium: 143 mEq/L (ref 135–145)

## 2010-05-21 LAB — DIFFERENTIAL
Basophils Absolute: 0 10*3/uL (ref 0.0–0.1)
Basophils Relative: 0 % (ref 0–1)
Eosinophils Absolute: 0 10*3/uL (ref 0.0–0.7)
Eosinophils Relative: 1 % (ref 0–5)
Lymphocytes Relative: 23 % (ref 12–46)
Lymphs Abs: 1.3 10*3/uL (ref 0.7–4.0)
Monocytes Absolute: 0.4 10*3/uL (ref 0.1–1.0)
Monocytes Relative: 7 % (ref 3–12)
Neutro Abs: 3.7 10*3/uL (ref 1.7–7.7)
Neutrophils Relative %: 69 % (ref 43–77)

## 2010-05-21 LAB — GLUCOSE, CAPILLARY
Glucose-Capillary: 100 mg/dL — ABNORMAL HIGH (ref 70–99)
Glucose-Capillary: 108 mg/dL — ABNORMAL HIGH (ref 70–99)
Glucose-Capillary: 121 mg/dL — ABNORMAL HIGH (ref 70–99)
Glucose-Capillary: 123 mg/dL — ABNORMAL HIGH (ref 70–99)
Glucose-Capillary: 131 mg/dL — ABNORMAL HIGH (ref 70–99)
Glucose-Capillary: 139 mg/dL — ABNORMAL HIGH (ref 70–99)
Glucose-Capillary: 141 mg/dL — ABNORMAL HIGH (ref 70–99)
Glucose-Capillary: 141 mg/dL — ABNORMAL HIGH (ref 70–99)
Glucose-Capillary: 202 mg/dL — ABNORMAL HIGH (ref 70–99)
Glucose-Capillary: 58 mg/dL — ABNORMAL LOW (ref 70–99)
Glucose-Capillary: 74 mg/dL (ref 70–99)
Glucose-Capillary: 90 mg/dL (ref 70–99)
Glucose-Capillary: 91 mg/dL (ref 70–99)

## 2010-05-21 LAB — TYPE AND SCREEN
ABO/RH(D): O POS
Antibody Screen: NEGATIVE

## 2010-05-21 LAB — MRSA CULTURE

## 2010-05-21 LAB — ABO/RH: ABO/RH(D): O POS

## 2010-05-21 LAB — PROTIME-INR
INR: 1.07 (ref 0.00–1.49)
Prothrombin Time: 14.1 seconds (ref 11.6–15.2)

## 2010-05-21 LAB — URINE MICROSCOPIC-ADD ON

## 2010-05-21 LAB — APTT: aPTT: 29 seconds (ref 24–37)

## 2010-05-21 LAB — SURGICAL PCR SCREEN
MRSA, PCR: INVALID — AB
Staphylococcus aureus: INVALID — AB

## 2010-05-26 ENCOUNTER — Telehealth: Payer: Self-pay | Admitting: *Deleted

## 2010-05-26 NOTE — Telephone Encounter (Signed)
She had left a message that she had stopped her pills. I called her back [phone]. No answer. Will keep trying.Leah Wiggins

## 2010-05-26 NOTE — Telephone Encounter (Signed)
She has finished the bactrim. States she feels fine but someone in backround disputed that. Asked her to make a follow up appt. She agreed. Marland KitchenElijah Birk, Esperanza Richters

## 2010-06-28 ENCOUNTER — Ambulatory Visit: Payer: Self-pay | Admitting: Infectious Disease

## 2010-07-07 ENCOUNTER — Encounter: Payer: Self-pay | Admitting: Infectious Disease

## 2010-07-07 ENCOUNTER — Ambulatory Visit (INDEPENDENT_AMBULATORY_CARE_PROVIDER_SITE_OTHER): Payer: No Typology Code available for payment source | Admitting: Infectious Disease

## 2010-07-07 VITALS — BP 138/78 | HR 80 | Temp 98.5°F | Ht 76.0 in | Wt 163.0 lb

## 2010-07-07 DIAGNOSIS — M009 Pyogenic arthritis, unspecified: Secondary | ICD-10-CM

## 2010-07-07 DIAGNOSIS — J15211 Pneumonia due to Methicillin susceptible Staphylococcus aureus: Secondary | ICD-10-CM

## 2010-07-07 LAB — CBC WITH DIFFERENTIAL/PLATELET
Basophils Absolute: 0 10*3/uL (ref 0.0–0.1)
Basophils Relative: 1 % (ref 0–1)
Eosinophils Absolute: 0.2 10*3/uL (ref 0.0–0.7)
Eosinophils Relative: 4 % (ref 0–5)
HCT: 38.8 % (ref 36.0–46.0)
Hemoglobin: 13 g/dL (ref 12.0–15.0)
Lymphocytes Relative: 38 % (ref 12–46)
Lymphs Abs: 1.7 10*3/uL (ref 0.7–4.0)
MCH: 26.5 pg (ref 26.0–34.0)
MCHC: 33.5 g/dL (ref 30.0–36.0)
MCV: 79 fL (ref 78.0–100.0)
Monocytes Absolute: 0.3 10*3/uL (ref 0.1–1.0)
Monocytes Relative: 7 % (ref 3–12)
Neutro Abs: 2.2 10*3/uL (ref 1.7–7.7)
Neutrophils Relative %: 50 % (ref 43–77)
Platelets: 129 10*3/uL — ABNORMAL LOW (ref 150–400)
RBC: 4.91 MIL/uL (ref 3.87–5.11)
RDW: 16.5 % — ABNORMAL HIGH (ref 11.5–15.5)
WBC: 4.4 10*3/uL (ref 4.0–10.5)

## 2010-07-07 LAB — SEDIMENTATION RATE: Sed Rate: 14 mm/hr (ref 0–22)

## 2010-07-07 NOTE — Progress Notes (Signed)
Subjective:    Patient ID: Leah Wiggins, female    DOB: 10-12-1926, 77 y.o.   MRN: [id number]  HPI 77 year old African  American female with past medical history significant for diabetes,   hypertension, osteoarthritis.  Underwent right total knee hip  replacement by Leah Wiggins on November 03, 2009 She had been doing well and was  discharged on September 1.  However, approximately a week ago, she  developed swelling at the incision site with pain and fever.  She was   having difficulty walking and it became so severe that she came to the  emergency room for evaluation.  She was found in the ER to be febrile  and had swelling of the hip.  She was seen by orthopedic surgery who did  aspiration of the hip which revealed murky fluid with multiple white   blood cells.  She was brought to the operating room and underwent  incision and debridement of abscess where copious murky material was  found as well as necrotic material.  According to Leah Wiggins operative  note this fluid was largely above the fascia, does not appear that   debridement was carried out into the actual hip joint.  cultures have grown methicillin-sensitive staph aureus.  The patient is  allergic to penicillin which causes welts.  She has not been on  cephalosporins or other  penicillins. She was treated with IV vancomycin and oral rifampin but apparently had GI upset with rifampin and went to vancomycin alone. Her PICC line malfunctioned and my partner Leah Wiggins had it removed. Leah Wiggins saw her and promptly put her back on oral antibiotics TMP/SMX. Followup sedimentation rate C-reactive protein were encouraging. She took herself off of the Bactrim in Wiggins. Her daughter convinced her to restart this medicine in a few weeks ago but then she then stopped it again. She really does not like taking the medication. She denies any recurrence of hip pain she claims at night but she does feel warm and occasionally has sweats.   Review of  Systems As in history of present illness otherwise 12 point review of systems is negative.    Objective:   Physical Exam  Constitutional: She is oriented to person, place, and time. She appears well-developed and well-nourished. No distress.  HENT:  Head: Normocephalic and atraumatic.  Mouth/Throat: No oropharyngeal exudate.  Eyes: Conjunctivae and EOM are normal. Pupils are equal, round, and reactive to light. No scleral icterus.  Neck: No JVD present.  Cardiovascular: Normal rate, regular rhythm and intact distal pulses.  Exam reveals no gallop and no friction rub.   No murmur heard. Pulmonary/Chest: No respiratory distress. She has no wheezes. She has no rales. She exhibits no tenderness.  Abdominal: She exhibits no distension and no mass. There is no tenderness. There is no rebound and no guarding.  Musculoskeletal: She exhibits edema.  Lymphadenopathy:    She has no cervical adenopathy.  Neurological: She is alert and oriented to person, place, and time.  Skin: Skin is warm and dry. She is not diaphoretic.          No fluctuance, no erythema or warmth about surgical site  Psychiatric: She has a normal mood and affect. Her behavior is normal. Judgment and thought content normal.          Assessment & Plan:  SEPTIC ARTHRITIS The patient has completed several months of antibiotics. She seems to be doing well clinically without antibiotics B. we will check a sedimentation and  C-reactive protein. If these are normal we'll continue to observe her off antibiotics with followup in our clinic.  METHICILLIN SUSECPTIBLE PNEUMONIA STAPH AUREUS Leah Wiggins as above she appears to have done well and continues to do well off of antibiotics. She does have these vague symptoms of sweats at night but I do not know the significance of them again we'll check a sedimentation rate and C-reactive protein.

## 2010-07-07 NOTE — Patient Instructions (Signed)
We will check some labs today I will call you if they are abnormal or we need to do something I will call you Otherwise you may remain off hte antibiotics

## 2010-07-07 NOTE — Assessment & Plan Note (Signed)
Dan as above she appears to have done well and continues to do well off of antibiotics. She does have these vague symptoms of sweats at night but I do not know the significance of them again we'll check a sedimentation rate and C-reactive protein.

## 2010-07-07 NOTE — Assessment & Plan Note (Signed)
The patient has completed several months of antibiotics. She seems to be doing well clinically without antibiotics B. we will check a sedimentation and C-reactive protein. If these are normal we'll continue to observe her off antibiotics with followup in our clinic.

## 2010-07-08 LAB — BASIC METABOLIC PANEL
BUN: 23 mg/dL (ref 6–23)
CO2: 21 mEq/L (ref 19–32)
Calcium: 9.6 mg/dL (ref 8.4–10.5)
Chloride: 104 mEq/L (ref 96–112)
Creat: 0.89 mg/dL (ref 0.40–1.20)
Glucose, Bld: 87 mg/dL (ref 70–99)
Potassium: 4.5 mEq/L (ref 3.5–5.3)
Sodium: 139 mEq/L (ref 135–145)

## 2010-07-08 LAB — C-REACTIVE PROTEIN: CRP: 0.1 mg/dL (ref <0.6)

## 2010-07-23 NOTE — Procedures (Signed)
. [facility]  Patient:    Leah Wiggins, Leah Wiggins                  MRN: [id number] Proc. Date: 07/14/00 Adm. Date:  [id number] Attending:  Charna Elizabeth CC:         [name], M.D.   Procedure Report  DATE OF BIRTH:  1926/05/01.  REFERRING PHYSICIAN:  [name], M.D.  PROCEDURE PERFORMED:  Colonoscopy.  ENDOSCOPIST:  Anselmo Rod, M.D.  INSTRUMENT USED:  Olympus pediatric video colonoscope.  INDICATIONS FOR PROCEDURE:  Rectal bleeding in a 77 year old African-American female, rule out colonic polyps, masses, hemorrhoids, etc.  PREPROCEDURE PREPARATION:  Informed consent was procured from the patient. The patient was fasted for eight hours prior to the procedure and prepped with a bottle of magnesium citrate and a gallon of NuLytely the night prior to the procedure.  PREPROCEDURE PHYSICAL:  The patient had stable vital signs.  Neck supple. Chest clear to auscultation.  S1, S2 regular.  Abdomen soft with normal abdominal bowel sounds.  DESCRIPTION OF PROCEDURE:  The patient was placed in the left lateral decubitus position and sedated with 50 mg of Demerol and 7 mg of Versed intravenously.  Once the patient was adequately sedated and maintained on low-flow oxygen and continuous cardiac monitoring, the Olympus video colonoscope was advanced from the rectum to the cecum and terminal ileum without difficulty.  The patient had an excellent prep.  No masses, polyps, erosions, ulcerations or diverticula were seen.  The terminal ileum appeared normal.  There were large internal hemorrhoids seen on retroflexion.  These were not bleeding at the time of examination.  Two small sessile polyps biopsied from the anal verge.  IMPRESSION:  Healthy-appearing colon up to the terminal ileum except for large internal hemorrhoids and two sessile polyps biopsed from the anal verge.  RECOMMENDATIONS: 1. Surgical evaluation for hemorrhoidectomy  has been planned for the patient. 2. A CBC, PT, and PTT will be checked today. 3. Outpatient follow-up is advised in the next week. DD:  07/14/00 TD:  07/14/00 Job: [id number]

## 2010-07-23 NOTE — Discharge Summary (Signed)
NAME:  Leah Wiggins, Leah Wiggins                     ACCOUNT NO.:  [id number]   MEDICAL RECORD NO.:  [id number]                   PATIENT TYPE:  OBV   LOCATION:  4727                                 FACILITY:  [facility]   PHYSICIAN:  Sherin Quarry, MD                   DATE OF BIRTH:  Jan 27, 1927   DATE OF ADMISSION:  11/15/2002  DATE OF DISCHARGE:                                 DISCHARGE SUMMARY   Leah Wiggins is a 77 year old lady with a past history of diabetes who  presented to the emergency room on November 15, 2002, p.m. with a three-day  history of a sharp discomfort that she describes as being either at the base  of the left chest or possibly in the left upper quadrant portion of the  abdomen.  She says that the pain is exacerbated by lying down and she  describes it as a dull fluttering sensation.  There is no associated  shortness of breath or nausea.  There has been no recent history of lower  extremity edema, cough, orthopnea, or PND.  The patient was initially seen  in the emergency room by Dr. Soyla Dryer.  At that time her blood pressure was  141/68, pulse was 74, respirations 20, pulse oximeter showed an O2 sat of  98% on room air.  HEENT exam was within normal limits.  The chest was clear.  Cardiovascular exam revealed normal S1 and S2, there are no rubs, murmurs or  gallops.  The abdomen was obese and had normal bowel sounds without masses,  tenderness or organomegaly.  Examination of the extremities showed there was  no lower extremity edema.  The patient had multiple troponin levels, a total  of four, all of which were normal.  The CBC was within normal limits.  The  CMET was remarkable only for a glucose of 135.  EKG showed a normal sinus  rhythm without ST changes.  Almost immediately after being hospitalized, the  patient's symptoms seemed to entirely resolve.  She did receive one dose of  Protonix.  On the morning of November 16, 2002, she was feeling entirely  well,  her vital signs were stable, and she denied chest pain, breathing  difficulty, nausea or vomiting.  As the patient had ruled out for heart  attack and as I felt the patient's symptoms were unlikely to be of cardiac  origin, I felt it was reasonable to discharge her at that time with a plan  to follow up with her Primary Doctor, Dr. Dorothyann Peng.  Therefore, on  November 16, 2002, the patient was discharged.   DISCHARGE DIAGNOSES:  1. Pain probably not of cardiac origin, possibly secondary to gastritis.  2. Diabetes.  3. History of breast cancer.  4. Tinea pedis.   DISCHARGE MEDICATIONS:  1. On discharge the patient was advised to continue Actos which she has been     taking for her diabetes.  2. She was also empirically given Protonix 40 mg with instructions to take     one daily.  3. She was also given Lotrisone 1% cream to apply to her feet for her tinea     pedis.   She was encouraged to contact her Primary Doctor, Dr. Dorothyann Peng, on  Monday to arrange for a return appointment to further discuss her symptoms.                                                Sherin Quarry, MD    SY/MEDQ  D:  11/16/2002  T:  11/16/2002  Job:  [id number]   cc:   Candyce Churn. Allyne Gee, M.D.  [address]  [address]  [place]  [place] [address]  Fax: [phone]

## 2010-07-23 NOTE — H&P (Signed)
NAME:  Leah Wiggins, Leah Wiggins                     ACCOUNT NO.:  [id number]   MEDICAL RECORD NO.:  [id number]                   PATIENT TYPE:  OBV   LOCATION:  1829                                 FACILITY:  [facility]   PHYSICIAN:  Hortencia Pilar, M.D.                 DATE OF BIRTH:  04-14-1926   DATE OF ADMISSION:  11/15/2002  DATE OF DISCHARGE:                                HISTORY & PHYSICAL   CHIEF COMPLAINT:  Chest pain.   HISTORY OF PRESENT ILLNESS:  The patient is a 77 year old African American  female, with a past medical history for diabetes, who presents to the  hospital with a two-day history of chest pain which she describes as sharp  for a few seconds and then a dull pressure with no associated shortness of  breath or nausea.  The patient states that two nights ago when she laid down  she started having chest pain that was on her chest with the left side of  her chest that did not radiate.  She states she has never had this before.  It improved when she sat up.  This pain did not come on with any activity.  She denies any lower extremity edema, cough, orthopnea, PND.  She states  that the pain has been going for the last two days with some sharp,  intermittent pain that last a few seconds.   Other than her current symptoms, she has been in her normal state of health.  The rest of the review of systems si negative.   PAST MEDICAL HISTORY:  1. Significant for diabetes.  2. History of breast cancer.   PAST SURGICAL HISTORY:  Mastectomy in 1991.   SOCIAL HISTORY:  Tobacco but no alcohol or drug use.   MEDICATIONS:  Actos.  A medication she takes for Reflux which she has not  been taking.   ALLERGIES:  PENICILLIN.  She gets hives.   FAMILY HISTORY:  Not significant for heart disease.   PHYSICAL EXAMINATION:  VITAL SIGNS:  Her temperature is 97.3, blood pressure  141/68, pulse is 74, respiratory rate of 20.  Pulse oximeter is 98% on room  air.  GENERAL:  She is in no  apparent distress.  Alert and oriented x 3 with no  affect.  HEENT:  Extraocular movements intact.  Pupils are equal, round, and reactive  to light.  Oropharynx is clear.  LUNGS:  Clear to auscultation bilaterally.  HEART:  Normal S1 and S2.  Regular rate and rhythm without murmurs, rubs, or  gallops.  NECK:  She has no JVD.  ABDOMEN:  Soft and nontender.  Positive bowel sounds.  EXTREMITIES:  She has no lower extremity edema.  She has no cyanosis.  She  has some white scaly plaque on the plantar surfaces of both feet.   LABORATORY DATA:  CK-MB of 3.3, troponin of less than 0.05 x 3.  White cell  count  of 6.8, hemoglobin of 13.0, hematocrit is 39.3, platelet count of  118,000.  Sodium 144, potassium 4.0, chloride 109, CO2 29, glucose 135, BUN  13, creatinine 0.7, calcium 9.1.  Albumin 3.5, AST 23, ALT 20, alkaline  phosphate 103.  Total bilirubin 0.2.   EKG which is normal sinus rhythm.  She has no ST changes and she has a  normal axis and normal QT interval.  Her chest x-ray shows right lower lobe  atelectasis.   ASSESSMENT/PLAN:  This is a 77 year old African American female, with a  history of diabetes, presenting with atypical chest pain.   PROBLEM LIST:  1. Chest pain:  The patient has multiple risk factors for heart disease     including diabetes.  She may have a history of hypertension, smoking     history, and age.  She states that she has seen her primary care     physician and went through a stress test one year ago which was negative.     She does not remember the cardiologist who did the stress test at that     time.  Presently, her chest pain is atypical.  It is not brought on by     any exertion.  It worsens with lying down.  Seems more to be     gastrointestinal in origin.  Her electrocardiogram is normal.  She has     cardiac enzyme changes.  She is currently pain-free.  The plan is to get     another set of enzymes in the morning and if those are negative her      primary care physician can do a cardiac workup as an outpatient unless     she has persistent symptoms during her hospital stay.  2. Tinea pedis:  The patient with white plaque on the plantar surface of her     feet.  We will initiate fungal cream.                                                Hortencia Pilar, M.D.    SL/MEDQ  D:  11/16/2002  T:  11/16/2002  Job:  [id number]

## 2010-10-20 ENCOUNTER — Ambulatory Visit: Payer: No Typology Code available for payment source | Admitting: Infectious Disease

## 2010-11-09 ENCOUNTER — Ambulatory Visit (INDEPENDENT_AMBULATORY_CARE_PROVIDER_SITE_OTHER): Payer: No Typology Code available for payment source | Admitting: Infectious Disease

## 2010-11-09 ENCOUNTER — Encounter: Payer: Self-pay | Admitting: Infectious Disease

## 2010-11-09 DIAGNOSIS — M009 Pyogenic arthritis, unspecified: Secondary | ICD-10-CM

## 2010-11-09 DIAGNOSIS — J15211 Pneumonia due to Methicillin susceptible Staphylococcus aureus: Secondary | ICD-10-CM

## 2010-11-09 NOTE — Assessment & Plan Note (Signed)
See above discussion

## 2010-11-09 NOTE — Assessment & Plan Note (Signed)
She has not had evidence of clinical recurrence off of antibiotics we'll continue to observe her off of antibiotics.

## 2010-11-09 NOTE — Progress Notes (Signed)
Subjective:    Patient ID: Leah Wiggins, female    DOB: 1926-10-13, 77 y.o.   MRN: [id number]  HPI 77 year old African American female with past medical history significant for diabetes,  hypertension, osteoarthritis. Underwent right total knee hip replacement by Dr. Charlann Boxer on November 03, 2009 She had been doing well and was discharged on September 1. However, approximately a week ago, she developed swelling at the incision site with pain and fever. She was  having difficulty walking and it became so severe that she came to the emergency room for evaluation. She was found in the ER to be febrile and had swelling of the hip. She was seen by orthopedic surgery who did aspiration of the hip which revealed murky fluid with multiple white  blood cells. She was brought to the operating room and underwent incision and debridement of abscess where copious murky material was found as well as necrotic material. According to Dr. Dub Mikes operative note this fluid was largely above the fascia, does not appear that  debridement was carried out into the actual hip joint. cultures have grown methicillin-sensitive staph aureus. The patient is allergic to penicillin which causes welts. She has not been on cephalosporins or other penicillins. She was treated with IV vancomycin and oral rifampin but apparently had GI upset with rifampin and went to vancomycin alone. Her PICC line malfunctioned and my partner Dr. Maurice March had it removed. Dr. Charlann Boxer saw her and promptly put her back on oral antibiotics TMP/SMX. Followup sedimentation rate C-reactive protein were encouraging. She took herself off of the Bactrim in March 2012 Her daughter convinced her to restart this medicine in a few weeks afterwards but then she then stopped it again. She really does not like taking the medication. Saw her in May and at that time her sedimentation rate and C-reactive protein were completely normal. He has remained off of the Bactrim since then  without recurrence of pain or discomfort in the hip. She only rarely has bilateral pain radiating from her buttocks down to her her behind her knees on both sides with standing but this has not worsened and is stable. She has no fevers chills malaise nausea or other systemic symptoms.     Review of Systems  Constitutional: Negative for fever, chills, diaphoresis, activity change, appetite change, fatigue and unexpected weight change.  HENT: Negative for congestion, sore throat, rhinorrhea, sneezing, trouble swallowing and sinus pressure.   Eyes: Negative for photophobia and visual disturbance.  Respiratory: Negative for cough, chest tightness, shortness of breath, wheezing and stridor.   Cardiovascular: Negative for chest pain, palpitations and leg swelling.  Gastrointestinal: Negative for nausea, vomiting, abdominal pain, diarrhea, constipation, blood in stool, abdominal distention and anal bleeding.  Genitourinary: Negative for dysuria, hematuria, flank pain and difficulty urinating.  Musculoskeletal: Positive for myalgias and arthralgias. Negative for back pain, joint swelling and gait problem.  Skin: Negative for color change, pallor, rash and wound.  Neurological: Negative for dizziness, tremors, weakness and light-headedness.  Hematological: Negative for adenopathy. Does not bruise/bleed easily.  Psychiatric/Behavioral: Negative for behavioral problems, confusion, sleep disturbance, dysphoric mood, decreased concentration and agitation.       Objective:   Physical Exam  Constitutional: She is oriented to person, place, and time. She appears well-developed and well-nourished. No distress.  HENT:  Head: Normocephalic and atraumatic.  Mouth/Throat: Oropharynx is clear and moist. No oropharyngeal exudate.  Eyes: Conjunctivae and EOM are normal. Pupils are equal, round, and reactive to light. No scleral icterus.  Neck: Normal range of motion. Neck supple. No JVD present.    Cardiovascular: Normal rate, regular rhythm and normal heart sounds.  Exam reveals no gallop and no friction rub.   No murmur heard. Pulmonary/Chest: Effort normal and breath sounds normal. No respiratory distress. She has no wheezes. She has no rales. She exhibits no tenderness.  Abdominal: She exhibits no distension and no mass. There is no tenderness. There is no rebound and no guarding.  Musculoskeletal: She exhibits no edema and no tenderness.  Lymphadenopathy:    She has no cervical adenopathy.  Neurological: She is alert and oriented to person, place, and time. She has normal reflexes. She exhibits normal muscle tone. Coordination normal.  Skin: Skin is warm and dry. She is not diaphoretic. No erythema. No pallor.          Hip replacement site is clean dry and intact without tear edema fluctuance or pain.  Psychiatric: She has a normal mood and affect. Her behavior is normal. Judgment and thought content normal.          Assessment & Plan:

## 2010-12-13 LAB — CBC
HCT: 39.7
Hemoglobin: 12.9
MCHC: 32.4
MCV: 82.3
Platelets: 141 — ABNORMAL LOW
RBC: 4.82
RDW: 16.5 — ABNORMAL HIGH
WBC: 6.6

## 2010-12-13 LAB — URINE MICROSCOPIC-ADD ON

## 2010-12-13 LAB — DIFFERENTIAL
Basophils Absolute: 0
Basophils Relative: 0
Eosinophils Absolute: 0.1 — ABNORMAL LOW
Eosinophils Relative: 1
Lymphocytes Relative: 22
Lymphs Abs: 1.5
Monocytes Absolute: 0.6
Monocytes Relative: 9
Neutro Abs: 4.5
Neutrophils Relative %: 68

## 2010-12-13 LAB — URINE CULTURE
Colony Count: NO GROWTH
Culture: NO GROWTH

## 2010-12-13 LAB — URINALYSIS, ROUTINE W REFLEX MICROSCOPIC
Bilirubin Urine: NEGATIVE
Glucose, UA: NEGATIVE
Ketones, ur: NEGATIVE
Leukocytes, UA: NEGATIVE
Nitrite: NEGATIVE
Protein, ur: NEGATIVE
Specific Gravity, Urine: 1.014
Urobilinogen, UA: 0.2
pH: 6.5

## 2010-12-13 LAB — COMPREHENSIVE METABOLIC PANEL
ALT: 28
AST: 26
Albumin: 3.5
Alkaline Phosphatase: 55
BUN: 9
CO2: 28
Calcium: 8.7
Chloride: 106
Creatinine, Ser: 0.68
GFR calc Af Amer: >60
GFR calc non Af Amer: >60
Glucose, Bld: 104 — ABNORMAL HIGH
Potassium: 4.3
Sodium: 140
Total Bilirubin: 0.6
Total Protein: 6.6

## 2010-12-13 LAB — POCT CARDIAC MARKERS
CKMB, poc: 1.1
CKMB, poc: 1.6
Myoglobin, poc: 107
Myoglobin, poc: 88.8
Operator id: 270111
Operator id: 285491
Troponin i, poc: <0.05
Troponin i, poc: <0.05

## 2010-12-13 LAB — LIPASE, BLOOD: Lipase: 23

## 2010-12-22 ENCOUNTER — Other Ambulatory Visit: Payer: Self-pay | Admitting: Internal Medicine

## 2010-12-22 ENCOUNTER — Ambulatory Visit
Admission: RE | Admit: 2010-12-22 | Discharge: 2010-12-22 | Disposition: A | Discharge status: Home / Self Care | Payer: No Typology Code available for payment source | Source: Ambulatory Visit | Attending: Internal Medicine | Admitting: Internal Medicine

## 2010-12-22 DIAGNOSIS — R05 Cough: Secondary | ICD-10-CM

## 2010-12-22 DIAGNOSIS — R059 Cough, unspecified: Secondary | ICD-10-CM

## 2011-08-06 ENCOUNTER — Encounter (HOSPITAL_COMMUNITY): Payer: Self-pay | Admitting: *Deleted

## 2011-08-06 ENCOUNTER — Emergency Department (HOSPITAL_COMMUNITY)
Admission: EM | Admit: 2011-08-06 | Discharge: 2011-08-06 | Disposition: A | Discharge status: Home / Self Care | Payer: No Typology Code available for payment source | Attending: Emergency Medicine | Admitting: Emergency Medicine

## 2011-08-06 ENCOUNTER — Emergency Department (HOSPITAL_COMMUNITY): Payer: No Typology Code available for payment source

## 2011-08-06 DIAGNOSIS — R110 Nausea: Secondary | ICD-10-CM | POA: Insufficient documentation

## 2011-08-06 DIAGNOSIS — E119 Type 2 diabetes mellitus without complications: Secondary | ICD-10-CM | POA: Insufficient documentation

## 2011-08-06 DIAGNOSIS — G2581 Restless legs syndrome: Secondary | ICD-10-CM

## 2011-08-06 DIAGNOSIS — R21 Rash and other nonspecific skin eruption: Secondary | ICD-10-CM | POA: Insufficient documentation

## 2011-08-06 DIAGNOSIS — R61 Generalized hyperhidrosis: Secondary | ICD-10-CM | POA: Insufficient documentation

## 2011-08-06 DIAGNOSIS — R609 Edema, unspecified: Secondary | ICD-10-CM | POA: Insufficient documentation

## 2011-08-06 DIAGNOSIS — B351 Tinea unguium: Secondary | ICD-10-CM | POA: Insufficient documentation

## 2011-08-06 DIAGNOSIS — R079 Chest pain, unspecified: Secondary | ICD-10-CM | POA: Insufficient documentation

## 2011-08-06 LAB — URINALYSIS, ROUTINE W REFLEX MICROSCOPIC
Bilirubin Urine: NEGATIVE
Glucose, UA: NEGATIVE mg/dL
Ketones, ur: NEGATIVE mg/dL
Nitrite: NEGATIVE
Protein, ur: NEGATIVE mg/dL
Specific Gravity, Urine: 1.017 (ref 1.005–1.030)
Urobilinogen, UA: 0.2 mg/dL (ref 0.0–1.0)
pH: 6 (ref 5.0–8.0)

## 2011-08-06 LAB — TROPONIN I: Troponin I: <0.3 ng/mL (ref <0.30)

## 2011-08-06 LAB — URINE MICROSCOPIC-ADD ON

## 2011-08-06 LAB — BASIC METABOLIC PANEL
BUN: 16 mg/dL (ref 6–23)
CO2: 27 mEq/L (ref 19–32)
Calcium: 9.2 mg/dL (ref 8.4–10.5)
Chloride: 102 mEq/L (ref 96–112)
Creatinine, Ser: 0.74 mg/dL (ref 0.50–1.10)
GFR calc Af Amer: 89 mL/min — ABNORMAL LOW (ref >90)
GFR calc non Af Amer: 77 mL/min — ABNORMAL LOW (ref >90)
Glucose, Bld: 107 mg/dL — ABNORMAL HIGH (ref 70–99)
Potassium: 4.1 mEq/L (ref 3.5–5.1)
Sodium: 141 mEq/L (ref 135–145)

## 2011-08-06 LAB — GLUCOSE, CAPILLARY: Glucose-Capillary: 125 mg/dL — ABNORMAL HIGH (ref 70–99)

## 2011-08-06 MED ORDER — ALPRAZOLAM 0.25 MG PO TABS: 0.2500 mg | ORAL_TABLET | Freq: Every evening | ORAL | Status: DC | PRN

## 2011-08-06 MED ORDER — ALPRAZOLAM 0.25 MG PO TABS: 0.2500 mg | ORAL_TABLET | Freq: Every evening | ORAL | Status: AC | PRN

## 2011-08-06 MED ORDER — OMEPRAZOLE 20 MG PO CPDR: 20.0000 mg | DELAYED_RELEASE_CAPSULE | Freq: Every day | ORAL | Status: DC

## 2011-08-06 NOTE — ED Notes (Signed)
Pt came to the ED because she has been having generalized edema since yesterday.She also states that she has been having vertigo x few days when she stands. She is also having weakness in her legs since her right hip placement. Pt stated that she has not been having any SOB or CP. No nausea or vomiting. Will continue to monitor.

## 2011-08-06 NOTE — ED Notes (Signed)
Pt c/o bil LE, abd and "chest" swelling as well as pain underneath her breasts.  Presently denies pain.

## 2011-08-06 NOTE — Discharge Instructions (Signed)
Restless Legs Syndrome Restless legs syndrome is a movement disorder. It may also be called a sensori-motor disorder.  CAUSES  No one knows what specifically causes restless legs syndrome, but it tends to run in families. It is also more common in people with low iron, in pregnancy, in people who need dialysis, and those with nerve damage (neuropathy).Some medications may make restless legs syndrome worse.Those medications include drugs to treat high blood pressure, some heart conditions, nausea, colds, allergies, and depression. SYMPTOMS Symptoms include uncomfortable sensations in the legs. These leg sensations are worse during periods of inactivity or rest. They are also worse while sitting or lying down. Individuals that have the disorder describe sensations in the legs that feel like:  Pulling.   Drawing.   Crawling.   Worming.   Boring.   Tingling.   Pins and needles.   Prickling.   Pain.  The sensations are usually accompanied by an overwhelming urge to move the legs. Sudden muscle jerks may also occur. Movement provides temporary relief from the discomfort. In rare cases, the arms may also be affected. Symptoms may interfere with going to sleep (sleep onset insomnia). Restless legs syndrome may also be related to periodic limb movement disorder (PLMD). PLMD is another more common motor disorder. It also causes interrupted sleep. The symptoms from PLMD usually occur most often when you are awake. TREATMENT  Treatment for restless legs syndrome is symptomatic. This means that the symptoms are treated.   Massage and cold compresses may provide temporary relief.   Walk, stretch, or take a cold or hot bath.   Get regular exercise and a good night's sleep.   Avoid caffeine, alcohol, nicotine, and medications that can make it worse.   Do activities that provide mental stimulation like discussions, needlework, and video games. These may be helpful if you are not able to walk or  stretch.  Some medications are effective in relieving the symptoms. However, many of these medications have side effects. Ask your caregiver about medications that may help your symptoms. Correcting iron deficiency may improve symptoms for some patients. Document Released: 02/11/2002 Document Revised: 02/10/2011 Document Reviewed: 05/20/2010 [facility] Patient Information 2012 [place], [place].  Fungus Infection of the Skin An infection of your skin caused by a fungus is a very common problem. Treatment depends on which part of the body is affected. Types of fungal skin infection include:  Athlete's Foot(Tinea pedis). This infection starts between the toes and may involve the entire sole and sides of foot. It is the most common fungal disease. It is made worse by heat, moisture, and friction. To treat, wash your feet 2 to 3 times daily. Dry thoroughly between the toes. Use medicated foot powder or cream as directed on the package. Plain talc, cornstarch, or rice powder may be dusted into socks and shoes to keep the feet dry. Wearing footwear that allows ventilation is also helpful.   Ringworm (Tinea corporis and tinea capitis). This infection causes scaly red rings to form on the skin or scalp. For skin sores, apply medicated lotion or cream as directed on the package. For the scalp, medicated shampoo may be used with with other therapies. Ringworm of the scalp or fingernails usually requires using oral medicine for 2 to 4 months.   Tinea versicolor. This infection appears as painless, scaly, patchy areas of discolored skin (whitish to light brown). It is more common in the summer and favors oily areas of the skin such as those found at the chest,  abdomen, back, pubis, neck, and body folds. It can be treated with medicated shampoo or with medicated topical cream. Oral antifungals may be needed for more active infections. The light and/or dark spots may take time to get better and is not a sign of treatment  failure.  Fungal infections may need to be treated for several weeks to be cured. It is important not to treat fungal infections with steroids or combination medicine that contains an antifungal and steroid as these will make the fungal infection worse. SEEK MEDICAL CARE IF:   You have persistent itching or rawness.   You have an oral temperature above 102 F (38.9 C).  Document Released: 03/31/2004 Document Revised: 02/10/2011 Document Reviewed: 06/16/2009 [facility] Patient Information 2012 [place], [place].

## 2011-08-06 NOTE — ED Provider Notes (Signed)
History     CSN: [id number]  Arrival date & time 08/06/11  7829   First MD Initiated Contact with Patient 08/06/11 [phone]      Chief Complaint  Patient presents with  . Edema    abdominal and LE  . Chest Pain     HPI Patient called her daughter this morning with complaints of tightness in her upper abdomen chest area associated with some diaphoresis.  Patient denies shortness of breath.  The tightness seems to be worse when she sits down and does not bother her when she is up walking around.  She's also had some rash on her toenails and the soles of her feet.  She feels that her lower extremities may have some swelling.  Patient denies headache, fever, abdominal pain, but does complain of some occasional nausea. Past Medical History  Diagnosis Date  . Breast cancer 1991  . Diabetes mellitus     Past Surgical History  Procedure Date  . Mastectomy partial / lumpectomy w/ axillary lymphadenectomy 1991     Right  . Total hip arthroplasty 2011    R    No family history on file.  History  Substance Use Topics  . Smoking status: Current Everyday Smoker -- 5 years    Types: Cigarettes  . Smokeless tobacco: Not on file  . Alcohol Use: No    OB History    Grav Para Term Preterm Abortions TAB SAB Ect Mult Living                  Review of Systems  All other systems reviewed and are negative.    Allergies  Penicillins  Home Medications   Current Outpatient Rx  Name Route Sig Dispense Refill  . ASPIRIN EC 81 MG PO TBEC Oral Take 81 mg by mouth daily.    Marland Kitchen METFORMIN HCL 500 MG PO TABS Oral Take 500 mg by mouth 2 (two) times daily with a meal.    . VALSARTAN 160 MG PO TABS Oral Take 160 mg by mouth daily.      Marland Kitchen ALPRAZOLAM 0.25 MG PO TABS Oral Take 1 tablet (0.25 mg total) by mouth at bedtime as needed for sleep. 30 tablet 0  . BD PEN NEEDLE SHORT U/F 31G X 8 MM MISC        BP 141/57  Pulse 67  Temp(Src) 98.1 F (36.7 C) (Oral)  Resp 19  SpO2 96%  Physical Exam   Nursing note and vitals reviewed. Constitutional: She is oriented to person, place, and time. She appears well-developed and well-nourished. No distress.  HENT:  Head: Normocephalic and atraumatic.  Eyes: Pupils are equal, round, and reactive to light.  Neck: Normal range of motion.  Cardiovascular: Normal rate and intact distal pulses.        Sinus rhythm Rate = 82 QTC = 439 QRS = 80 Borderline T. abnormalities Impression no significant change from previous tracing dated September 2011  Pulmonary/Chest: No respiratory distress.  Abdominal: Normal appearance. She exhibits no distension.  Musculoskeletal: Normal range of motion.  Neurological: She is alert and oriented to person, place, and time. No cranial nerve deficit.  Skin: Skin is warm and dry. Rash noted.     Psychiatric: She has a normal mood and affect. Her behavior is normal.    ED Course  Procedures (including critical care time)  Labs Reviewed  GLUCOSE, CAPILLARY - Abnormal; Notable for the following:    Glucose-Capillary 125 (*)    All  other components within normal limits  BASIC METABOLIC PANEL - Abnormal; Notable for the following:    Glucose, Bld 107 (*)    GFR calc non Af Amer 77 (*)    GFR calc Af Amer 89 (*)    All other components within normal limits  URINALYSIS, ROUTINE W REFLEX MICROSCOPIC - Abnormal; Notable for the following:    Hgb urine dipstick TRACE (*)    Leukocytes, UA TRACE (*)    All other components within normal limits  TROPONIN I  URINE MICROSCOPIC-ADD ON   Dg Chest 2 View  08/06/2011  *RADIOLOGY REPORT*  Clinical Data: Short of breath, chest pain  CHEST - 2 VIEW  Comparison: Chest radiograph 10/24/2009  Findings: Normal mediastinum and heart silhouette.  No effusion, infiltrate, pneumothorax.  Chronic degenerative changes of the left shoulder.  IMPRESSION: No acute cardiopulmonary process.  Original Report Authenticated By: Genevive Bi, M.D.     1. RESTLESS LEG SYNDROME   2.  Tinea unguium       MDM         Nelia Shi, MD 08/06/11 [phone]

## 2011-10-06 ENCOUNTER — Ambulatory Visit (INDEPENDENT_AMBULATORY_CARE_PROVIDER_SITE_OTHER): Payer: No Typology Code available for payment source | Admitting: Internal Medicine

## 2011-10-06 ENCOUNTER — Encounter: Payer: Self-pay | Admitting: Internal Medicine

## 2011-10-06 VITALS — BP 118/68 | HR 74 | Temp 98.6°F | Wt 172.0 lb

## 2011-10-06 DIAGNOSIS — R5383 Other fatigue: Secondary | ICD-10-CM

## 2011-10-06 DIAGNOSIS — R5381 Other malaise: Secondary | ICD-10-CM

## 2011-10-07 LAB — CBC WITH DIFFERENTIAL/PLATELET
Basophils Absolute: 0 10*3/uL (ref 0.0–0.1)
Basophils Relative: 1 % (ref 0–1)
Eosinophils Absolute: 0.1 10*3/uL (ref 0.0–0.7)
Eosinophils Relative: 2 % (ref 0–5)
HCT: 38.8 % (ref 36.0–46.0)
Hemoglobin: 12.8 g/dL (ref 12.0–15.0)
Lymphocytes Relative: 37 % (ref 12–46)
Lymphs Abs: 2 10*3/uL (ref 0.7–4.0)
MCH: 25 pg — ABNORMAL LOW (ref 26.0–34.0)
MCHC: 33 g/dL (ref 30.0–36.0)
MCV: 75.6 fL — ABNORMAL LOW (ref 78.0–100.0)
Monocytes Absolute: 0.4 10*3/uL (ref 0.1–1.0)
Monocytes Relative: 7 % (ref 3–12)
Neutro Abs: 2.9 10*3/uL (ref 1.7–7.7)
Neutrophils Relative %: 53 % (ref 43–77)
Platelets: 115 10*3/uL — ABNORMAL LOW (ref 150–400)
RBC: 5.13 MIL/uL — ABNORMAL HIGH (ref 3.87–5.11)
RDW: 17.6 % — ABNORMAL HIGH (ref 11.5–15.5)
WBC: 5.4 10*3/uL (ref 4.0–10.5)

## 2011-10-07 LAB — C-REACTIVE PROTEIN: CRP: <0.5 mg/dL (ref <0.60)

## 2011-10-07 LAB — SEDIMENTATION RATE: Sed Rate: 12 mm/hr (ref 0–22)

## 2011-10-12 ENCOUNTER — Encounter: Payer: Self-pay | Admitting: *Deleted

## 2011-10-12 DIAGNOSIS — M009 Pyogenic arthritis, unspecified: Secondary | ICD-10-CM

## 2011-10-12 NOTE — Progress Notes (Signed)
Patient ID: Leah Wiggins, female   DOB: Sep 28, 1926, 78 y.o.   MRN: [id number] Pt walked-in to Center to find out lab work results.  RN spoke with Dr. Drue Second.  Results within normal limits.  Pt does not need to f/u at [facility].  Pt to f/u with PCP as scheduled.  Dr. Drue Second has communicated with PCP.  Pt verbalized understanding.

## 2011-10-12 NOTE — Patient Instructions (Signed)
Pt verbalized understanding to f/u with her PCP as scheduled.

## 2011-12-04 NOTE — Progress Notes (Signed)
INFECTIOUS DISEASES CLINIC NOTE   RFV: annual follow up to septic arthritis Subjective:    Patient ID: Leah Wiggins, female    DOB: Jul 23, 1926, 78 y.o.   MRN: [id number]  HPI Leah Wiggins is an 78 year old African American female with PMHX of diabetes,  hypertension, osteoarthritis s/p right total knee hip replacement in 2011 and MSSA left hip septic arthritis IV vancomycin and chronic suppression with oral antibiotics TMP/SMX which stopped when her inflammatory markers normalized. She denies any recurrence of hip pain she claims at night but she does feel warm and occasionally has sweats. Due to her symptoms of nightsweats and feeling warm, she was concerned that maybe her hip is infected again. She states that she has no pain on ambulation. No swelling of hip or legs, no rash in the area.   Current Outpatient Prescriptions on File Prior to Visit  Medication Sig Dispense Refill  . alendronate (FOSAMAX) 70 MG tablet Take 70 mg by mouth every 7 (seven) days. Take with a full glass of water on an empty stomach.      Marland Kitchen aspirin EC 81 MG tablet Take 81 mg by mouth daily.      . metFORMIN (GLUCOPHAGE-XR) 500 MG 24 hr tablet Take 500 mg by mouth 2 (two) times daily.      . valsartan (DIOVAN) 160 MG tablet Take 160 mg by mouth daily.        Marland Kitchen omeprazole (PRILOSEC) 20 MG capsule Take 1 capsule (20 mg total) by mouth daily.  30 capsule  0   Active Ambulatory Problems    Diagnosis Date Noted  . METHICILLIN SUSECPTIBLE PNEUMONIA STAPH AUREUS 01/13/2010  . SEPTIC ARTHRITIS 01/13/2010  . HIP THI LEG&ANK ABRASION/FRICION BURN W/O INF 01/13/2010  . RESTLESS LEG SYNDROME 02/16/2010   Resolved Ambulatory Problems    Diagnosis Date Noted  . No Resolved Ambulatory Problems   Past Medical History  Diagnosis Date  . Breast cancer 1991  . Diabetes mellitus     Review of Systems  Constitutional: occ. Nightsweats. Negative for fever, chills, diaphoresis, activity change, appetite change, fatigue and  unexpected weight change.  HENT: Negative for congestion, sore throat, rhinorrhea, sneezing, trouble swallowing and sinus pressure.  Eyes: Negative for photophobia and visual disturbance.  Respiratory: Negative for cough, chest tightness, shortness of breath, wheezing and stridor.  Cardiovascular: Negative for chest pain, palpitations and leg swelling.  Gastrointestinal: Negative for nausea, vomiting, abdominal pain, diarrhea, constipation, blood in stool, abdominal distention and anal bleeding.  Genitourinary: Negative for dysuria, hematuria, flank pain and difficulty urinating.  Musculoskeletal: Negative for myalgias, back pain, joint swelling, arthralgias and gait problem.  Skin: Negative for color change, pallor, rash and wound.  Neurological: Negative for dizziness, tremors, weakness and light-headedness.  Hematological: Negative for adenopathy. Does not bruise/bleed easily.  Psychiatric/Behavioral: Negative for behavioral problems, confusion, sleep disturbance, dysphoric mood, decreased concentration and agitation.      Objective:   Physical Exam BP 118/68  Pulse 74  Temp 98.6 F (37 C) (Oral)  Wt 172 lb (78.019 kg) Physical Exam  Constitutional:  oriented to person, place, and time. appears well-developed and well-nourished. No distress.  HENT:  Mouth/Throat: Oropharynx is clear and moist. No oropharyngeal exudate.  Cardiovascular: Normal rate, regular rhythm and normal heart sounds. Exam reveals no gallop and no friction rub. No murmur heard.  Pulmonary/Chest: Effort normal and breath sounds normal. No respiratory distress. He has no wheezes.  Abdominal: Soft. Bowel sounds are normal. exhibits no distension. There is  no tenderness.  Lymphadenopathy: no cervical adenopathy.  Ext: no warmth noted on hips bilaterally Skin: Skin is warm and dry. No rash noted. No erythema.    Labs: Lab Results  Component Value Date   ESRSEDRATE 12 10/06/2011   Lab Results  Component Value  Date   CRP <0.5 10/06/2011      Assessment & Plan:   - will check inflammatory markers to see if any signs of inflammation that could explain her occ. Nightsweats. Physical exam does not suggests any acute problem with hip joint. If pain arises, or fevers, or persistent nightsweats,or abnormal inflammatory markers, will do further work-up.  rtc prn

## 2012-01-04 ENCOUNTER — Telehealth: Payer: Self-pay | Admitting: Hematology and Oncology

## 2012-01-04 NOTE — Telephone Encounter (Signed)
C/D 01/04/12 for appt. 01/17/12 °

## 2012-01-04 NOTE — Telephone Encounter (Signed)
S/W pt in re NP appt 11/12 @ 9:30 w/Dr. Chilton Greathouse, NP Dx-Abn Blood Ct Welcome packet maild.

## 2012-01-17 ENCOUNTER — Ambulatory Visit (HOSPITAL_BASED_OUTPATIENT_CLINIC_OR_DEPARTMENT_OTHER): Payer: No Typology Code available for payment source | Admitting: Lab

## 2012-01-17 ENCOUNTER — Encounter: Payer: Self-pay | Admitting: Hematology and Oncology

## 2012-01-17 ENCOUNTER — Ambulatory Visit: Payer: No Typology Code available for payment source

## 2012-01-17 ENCOUNTER — Ambulatory Visit (HOSPITAL_BASED_OUTPATIENT_CLINIC_OR_DEPARTMENT_OTHER): Payer: No Typology Code available for payment source | Admitting: Hematology and Oncology

## 2012-01-17 ENCOUNTER — Telehealth: Payer: Self-pay | Admitting: Hematology and Oncology

## 2012-01-17 VITALS — BP 141/57 | HR 68 | Temp 98.6°F | Resp 20 | Ht 64.0 in | Wt 171.4 lb

## 2012-01-17 DIAGNOSIS — D696 Thrombocytopenia, unspecified: Secondary | ICD-10-CM

## 2012-01-17 DIAGNOSIS — M810 Age-related osteoporosis without current pathological fracture: Secondary | ICD-10-CM

## 2012-01-17 DIAGNOSIS — E119 Type 2 diabetes mellitus without complications: Secondary | ICD-10-CM

## 2012-01-17 LAB — MANUAL DIFFERENTIAL
ALC: 2 10*3/uL (ref 0.9–3.3)
ANC (CHCC manual diff): 3 10*3/uL (ref 1.5–6.5)
Band Neutrophils: 0 % (ref 0–10)
Basophil: 1 % (ref 0–2)
Blasts: 0 % (ref 0–0)
EOS: 0 % (ref 0–7)
LYMPH: 34 % (ref 14–49)
MONO: 13 % (ref 0–14)
Metamyelocytes: 0 % (ref 0–0)
Myelocytes: 0 % (ref 0–0)
Other Cell: 0 % (ref 0–0)
PLT EST: DECREASED
PROMYELO: 0 % (ref 0–0)
SEG: 52 % (ref 38–77)
Variant Lymph: 0 % (ref 0–0)
White Cell Comments: NONE SEEN
nRBC: 0 % (ref 0–0)

## 2012-01-17 LAB — CBC WITH DIFFERENTIAL/PLATELET
HCT: 39.4 % (ref 34.8–46.6)
HGB: 13 g/dL (ref 11.6–15.9)
MCH: 25.3 pg (ref 25.1–34.0)
MCHC: 33 g/dL (ref 31.5–36.0)
MCV: 76.8 fL — ABNORMAL LOW (ref 79.5–101.0)
Platelets: 137 10*3/uL — ABNORMAL LOW (ref 145–400)
RBC: 5.13 10*6/uL (ref 3.70–5.45)
RDW: 17.1 % — ABNORMAL HIGH (ref 11.2–14.5)
WBC: 5.8 10*3/uL (ref 3.9–10.3)

## 2012-01-17 NOTE — Patient Instructions (Addendum)
Leah Wiggins  [id number]   [facility] - AFTER VISIT SUMMARY   **RECOMMENDATIONS MADE BY THE CONSULTANT AND ANY TEST    RESULTS WILL BE SENT TO YOUR REFERRING DOCTORS.   YOUR EXAM FINDINGS, LABS AND RESULTS WERE DISCUSSED BY YOUR MD TODAY.  YOU CAN GO TO THE [facility] WEB SITE FOR INSTRUCTIONS ON HOW TO ASSESS MY CHART FOR ADDITIONAL INFORMATION AS NEEDED.  Your Updated drug allergies are: Allergies as of 01/17/2012 - Review Complete 10/06/2011  Allergen Reaction Noted  . Penicillins Other (See Comments) 01/13/2010    Your current list of medications are: Current Outpatient Prescriptions  Medication Sig Dispense Refill  . alendronate (FOSAMAX) 70 MG tablet Take 70 mg by mouth every 7 (seven) days. Take with a full glass of water on an empty stomach.      Marland Kitchen aspirin EC 81 MG tablet Take 81 mg by mouth daily.      . metFORMIN (GLUCOPHAGE-XR) 500 MG 24 hr tablet Take 500 mg by mouth 2 (two) times daily.      Marland Kitchen omeprazole (PRILOSEC) 20 MG capsule Take 1 capsule (20 mg total) by mouth daily.  30 capsule  0  . valsartan (DIOVAN) 160 MG tablet Take 160 mg by mouth daily.           INSTRUCTIONS GIVEN AND DISCUSSED:  See attached schedule   SPECIAL INSTRUCTIONS/FOLLOW-UP:  See above.  I acknowledge that I have been informed and understand all the instructions given to me and received a copy.I know to contact the clinic, my physician, or go to the emergency Department if any problems should occur.   I do not have any more questions at this time, but understand that I may call the [facility] at [phone] during business hours should I have any further questions or need assistance in obtaining follow-up care.

## 2012-01-17 NOTE — Telephone Encounter (Signed)
gv and printed appt schedule for pt for NOV 19th for est..the patient aware

## 2012-01-17 NOTE — Progress Notes (Signed)
This office note has been dictated.

## 2012-01-17 NOTE — Progress Notes (Signed)
Checked in new patient. No financial issues. °

## 2012-01-18 ENCOUNTER — Ambulatory Visit (HOSPITAL_COMMUNITY)
Admission: RE | Admit: 2012-01-18 | Discharge: 2012-01-18 | Disposition: A | Discharge status: Home / Self Care | Payer: No Typology Code available for payment source | Source: Ambulatory Visit | Attending: Hematology and Oncology | Admitting: Hematology and Oncology

## 2012-01-18 DIAGNOSIS — D696 Thrombocytopenia, unspecified: Secondary | ICD-10-CM | POA: Insufficient documentation

## 2012-01-18 DIAGNOSIS — K7689 Other specified diseases of liver: Secondary | ICD-10-CM | POA: Insufficient documentation

## 2012-01-18 NOTE — Progress Notes (Signed)
CC:   Robyn N. Allyne Gee, M.D. Bebe Liter, FNP Nanetta Batty, M.D.  IDENTIFYING STATEMENT:  The patient is an 78 year old woman seen at the request of Dr. Allyne Gee with thrombocytopenia.  HISTORY OF PRESENT ILLNESS:  The patient is here with her daughter.  She reports no current concerns.  I had reviewed both office and hospital blood work dating back a few years.  It appears that she has had low platelet counts ranging in the low 100s.  The patient reports no history of bruising or bleeding.  She states she eats a well-balanced diet.  She has no fever or chills.  She does not take any herbal remedies.  She is on no medications.  She has not had any recent infections.  Denies a family history for thrombocytopenia.  She has a remote history of breast cancer, diagnosed in 1991 for which she underwent a mastectomy.  She did not require adjuvant treatment.  Most recent blood data notes the following:  On 09/27/2011, white cell count 5.6, hemoglobin 13.3, hematocrit 41, platelets 127; 10/06/2011, white cell count 5.4, hemoglobin 12.8, hematocrit 38.8, platelets 115; on 02/06/2010, white cell count 4.1, hemoglobin 12.6, hematocrit 38.7, platelets 117.  PAST MEDICAL HISTORY: 1. Diabetes. 2. Hyperlipidemia. 3. Hypertension. 4. Asthma. 5. History of septic arthritis. 6. Restless legs syndrome. 7. Status post right hip replacement. 8. Methicillin-susceptible staphylococcus aureus. 9. Osteoporosis. 10.Status post right mastectomy for breast cancer in 1991.  ALLERGIES:  Penicillin.  MEDICATIONS:  Fosamax 70 mg weekly, aspirin 81 mg daily, vitamin D daily, Vicodin q.6h. as needed, Glucophage XR 500 mg 2 times daily, Spiriva 18 mcg daily, and Diovan 160 mg daily.  SOCIAL HISTORY:  The patient is widowed with 8 children.  She is here with her youngest daughter, Harrel Lemon.  She is a chronic smoker, smoking half a pack per day for 60 years.  Denies alcohol use.  She is retired  as a Advertising copywriter.  FAMILY HISTORY:  The patient's sister had stomach cancer.  Negative for hematological disorders.  REVIEW OF SYSTEMS:  A 12-point review of systems negative. Specifically, no history for bruising or bleeding.  PHYSICAL EXAMINATION:  Patient is alert and oriented x3.  Vitals:  Pulse 68, blood pressure 141/62, temperature 98.6, respirations 20.  Weight 131.4 pounds.  HEENT:  Head is atraumatic, normocephalic.  Sclerae anicteric.  Pupils are equal, round, and reactive to light.  Mouth moist without ulcerations, thrush, or lesions.  Neck:  Supple without adenopathy.  Chest:  Demonstrates good entry except for scattered wheeze bilaterally.  CVS:  First and second heart sounds present.  No added sounds or murmurs.  Notes a right chest wall mastectomy scar with no chest wall lesions.  Left breast:  No dominant masses or nipple discharge.  Abdomen:  Soft.  No hepatomegaly.  Bowel sounds are present. Extremities:  No calf tenderness.  Pulses present and symmetrical. Lymph nodes:  No palpable cervical, axillary, or inguinal adenopathy. CNS:  Nonfocal.  IMPRESSION/PLAN:  Ms. Huntoon is a pleasant 78 year old woman who appears to have chronic thrombocytopenia.  Her platelets have not fallen below 100,000.  They seem to fluctuate between 115 and 300,000 over the last several years.  She is asymptomatic and appears to have a normal white blood cell and hemoglobin at this time.  However with this said, I will have her return to the lab to repeat a CBC with a differential.  We will review morphology.  We need to rule out plasma cell dyscrasia with a  serum protein electrophoresis with reflex IFE.  We will also obtain a vitamin B12 level.  I spent more than half the time in discussion about the common causes for thrombocytopenia and likely treatment.  Depending on results, the patient was told that a bone marrow biopsy with aspiration may be performed if labs indicate so.  She does  return next week to discuss results.  All questions were answered.    ______________________________ Laurice Record, M.D. LIO/MEDQ  D:  01/17/2012  T:  01/17/2012  Job:  [id number]

## 2012-01-20 LAB — SPEP & IFE WITH QIG
Albumin ELP: 55.5 % — ABNORMAL LOW (ref 55.8–66.1)
Alpha-1-Globulin: 5.1 % — ABNORMAL HIGH (ref 2.9–4.9)
Alpha-2-Globulin: 14.9 % — ABNORMAL HIGH (ref 7.1–11.8)
Beta 2: 6.1 % (ref 3.2–6.5)
Beta Globulin: 5.9 % (ref 4.7–7.2)
Gamma Globulin: 12.5 % (ref 11.1–18.8)
IgA: 261 mg/dL (ref 69–380)
IgG (Immunoglobin G), Serum: 926 mg/dL (ref 690–1700)
IgM, Serum: 26 mg/dL — ABNORMAL LOW (ref 52–322)
Total Protein, Serum Electrophoresis: 7 g/dL (ref 6.0–8.3)

## 2012-01-20 LAB — VITAMIN B12: Vitamin B-12: 860 pg/mL (ref 211–911)

## 2012-01-24 ENCOUNTER — Ambulatory Visit (HOSPITAL_BASED_OUTPATIENT_CLINIC_OR_DEPARTMENT_OTHER): Payer: No Typology Code available for payment source | Admitting: Hematology and Oncology

## 2012-01-24 ENCOUNTER — Other Ambulatory Visit: Payer: No Typology Code available for payment source | Admitting: Lab

## 2012-01-24 ENCOUNTER — Encounter: Payer: Self-pay | Admitting: Hematology and Oncology

## 2012-01-24 ENCOUNTER — Telehealth: Payer: Self-pay | Admitting: Hematology and Oncology

## 2012-01-24 VITALS — BP 146/59 | HR 74 | Temp 98.2°F | Resp 20 | Ht 64.0 in | Wt 174.3 lb

## 2012-01-24 DIAGNOSIS — D696 Thrombocytopenia, unspecified: Secondary | ICD-10-CM | POA: Insufficient documentation

## 2012-01-24 NOTE — Progress Notes (Signed)
CC:   Leah Wiggins, M.D. Leah Liter, FNP Leah Wiggins, M.D.  IDENTIFYING STATEMENT:  The patient is an 78 year old woman who presents to discuss results.  INTERVAL HISTORY:  To summarize, the patient is here to evaluate thrombocytopenia.  At her last visit she reported no current concerns. She was here with her daughter.  I had reviewed her platelet counts going back in time which showed a range in the low 100s to as high as 300.  Presently has no issues with bruising or bleeding.  Results note the following:  On 01/17/2012 white cell count 5.8, hemoglobin 13, hematocrit 39.4, platelets 137; peripheral smear, a few giant platelets otherwise unremarkable; B12 860; serum protein electrophoresis did not reveal a monoclonal gammopathy; an ultrasound of the abdomen on 01/18/2012 showed fatty infiltrate of liver with a normal sized spleen.  MEDICATIONS:  Reviewed and updated.  ALLERGIES:  Penicillin.  PAST MEDICAL HISTORY/FAMILY HISTORY/SOCIAL HISTORY:  Unchanged.  REVIEW OF SYSTEMS:  Ten point review of systems negative.  PHYSICAL EXAM:  General:  The patient is a well-appearing, well- nourished woman in no distress.  Vitals:  Pulse 74, blood pressure 146/59, temperature 98.2, respirations 20, weight 134 pounds.  HEENT: Head is atraumatic, normocephalic.  Sclerae anicteric.  Mouth moist. Chest:  Clear.  CVS:  First and second heart sounds present.  No added sounds or murmurs.  Abdomen:  Soft, nontender.  No masses.  Bowel sounds present.  Extremities:  No edema.  Skin:  No bruising or petechia. Lymph nodes:  No adenopathy.  LABORATORY DATA:  As above.  In addition, IgG 926, IgA 261, IgM 26.  IMPRESSION AND PLAN:  Ms. Gallaher is an 78 year old woman with a mild thrombocytopenia which does not require a bone marrow biopsy at this time.  I recommend observation.  She is asymptomatic.  Blood work indicates her platelets have been fluctuating in the high 100s to  normal over several years.  She returns in 6 months' time with a CBC.    ______________________________ Laurice Record, M.D. LIO/MEDQ  D:  01/24/2012  T:  01/24/2012  Job:  [id number]

## 2012-01-24 NOTE — Patient Instructions (Signed)
Leah Wiggins  [id number]   [facility] - AFTER VISIT SUMMARY   **RECOMMENDATIONS MADE BY THE CONSULTANT AND ANY TEST    RESULTS WILL BE SENT TO YOUR REFERRING DOCTORS.   YOUR EXAM FINDINGS, LABS AND RESULTS WERE DISCUSSED BY YOUR MD TODAY.  YOU CAN GO TO THE [facility] WEB SITE FOR INSTRUCTIONS ON HOW TO ASSESS MY CHART FOR ADDITIONAL INFORMATION AS NEEDED.  Your Updated drug allergies are: Allergies as of 01/24/2012 - Review Complete 01/24/2012  Allergen Reaction Noted  . Penicillins Other (See Comments) 01/13/2010    Your current list of medications are: Current Outpatient Prescriptions  Medication Sig Dispense Refill  . ADVOCATE REDI-CODE test strip       . alendronate (FOSAMAX) 70 MG tablet Take 70 mg by mouth every 7 (seven) days. Take with a full glass of water on an empty stomach.      Marland Kitchen aspirin EC 81 MG tablet Take 81 mg by mouth daily.      . Ergocalciferol (VITAMIN D2 PO) Take 123 mg by mouth daily.      Marland Kitchen HYDROcodone-homatropine (HYCODAN) 5-1.5 MG/5ML syrup Take 2.5 mLs by mouth every 6 (six) hours as needed.      . Lite Touch Lancets MISC       . metFORMIN (GLUCOPHAGE-XR) 500 MG 24 hr tablet Take 500 mg by mouth 2 (two) times daily.      Marland Kitchen SPIRIVA HANDIHALER 18 MCG inhalation capsule       . valsartan (DIOVAN) 160 MG tablet Take 160 mg by mouth daily.           INSTRUCTIONS GIVEN AND DISCUSSED:  See attached schedule   SPECIAL INSTRUCTIONS/FOLLOW-UP:  See above.  I acknowledge that I have been informed and understand all the instructions given to me and received a copy.I know to contact the clinic, my physician, or go to the emergency Department if any problems should occur.   I do not have any more questions at this time, but understand that I may call the [facility] at [phone] during business hours should I have any further questions or need assistance in obtaining follow-up care.

## 2012-01-24 NOTE — Progress Notes (Signed)
This office note has been dictated.

## 2012-01-24 NOTE — Telephone Encounter (Signed)
Gave pt appt for lab and MD for May 2014 °

## 2012-02-03 ENCOUNTER — Ambulatory Visit: Payer: No Typology Code available for payment source | Admitting: Hematology and Oncology

## 2012-02-03 ENCOUNTER — Other Ambulatory Visit: Payer: No Typology Code available for payment source | Admitting: Lab

## 2012-04-15 IMAGING — CR DG HIP COMPLETE 2+V*R*
3 series · 3 of 3 positions shown · non-contrast
Comparison: Portable AP pelvis x-ray 11/02/2009.

CLINICAL DATA: Right hip pain with decreased mobility, fever, and
erythema.

RIGHT HIP - COMPLETE 2+ VIEW 11/21/2009:

[t pelvis a.p.]
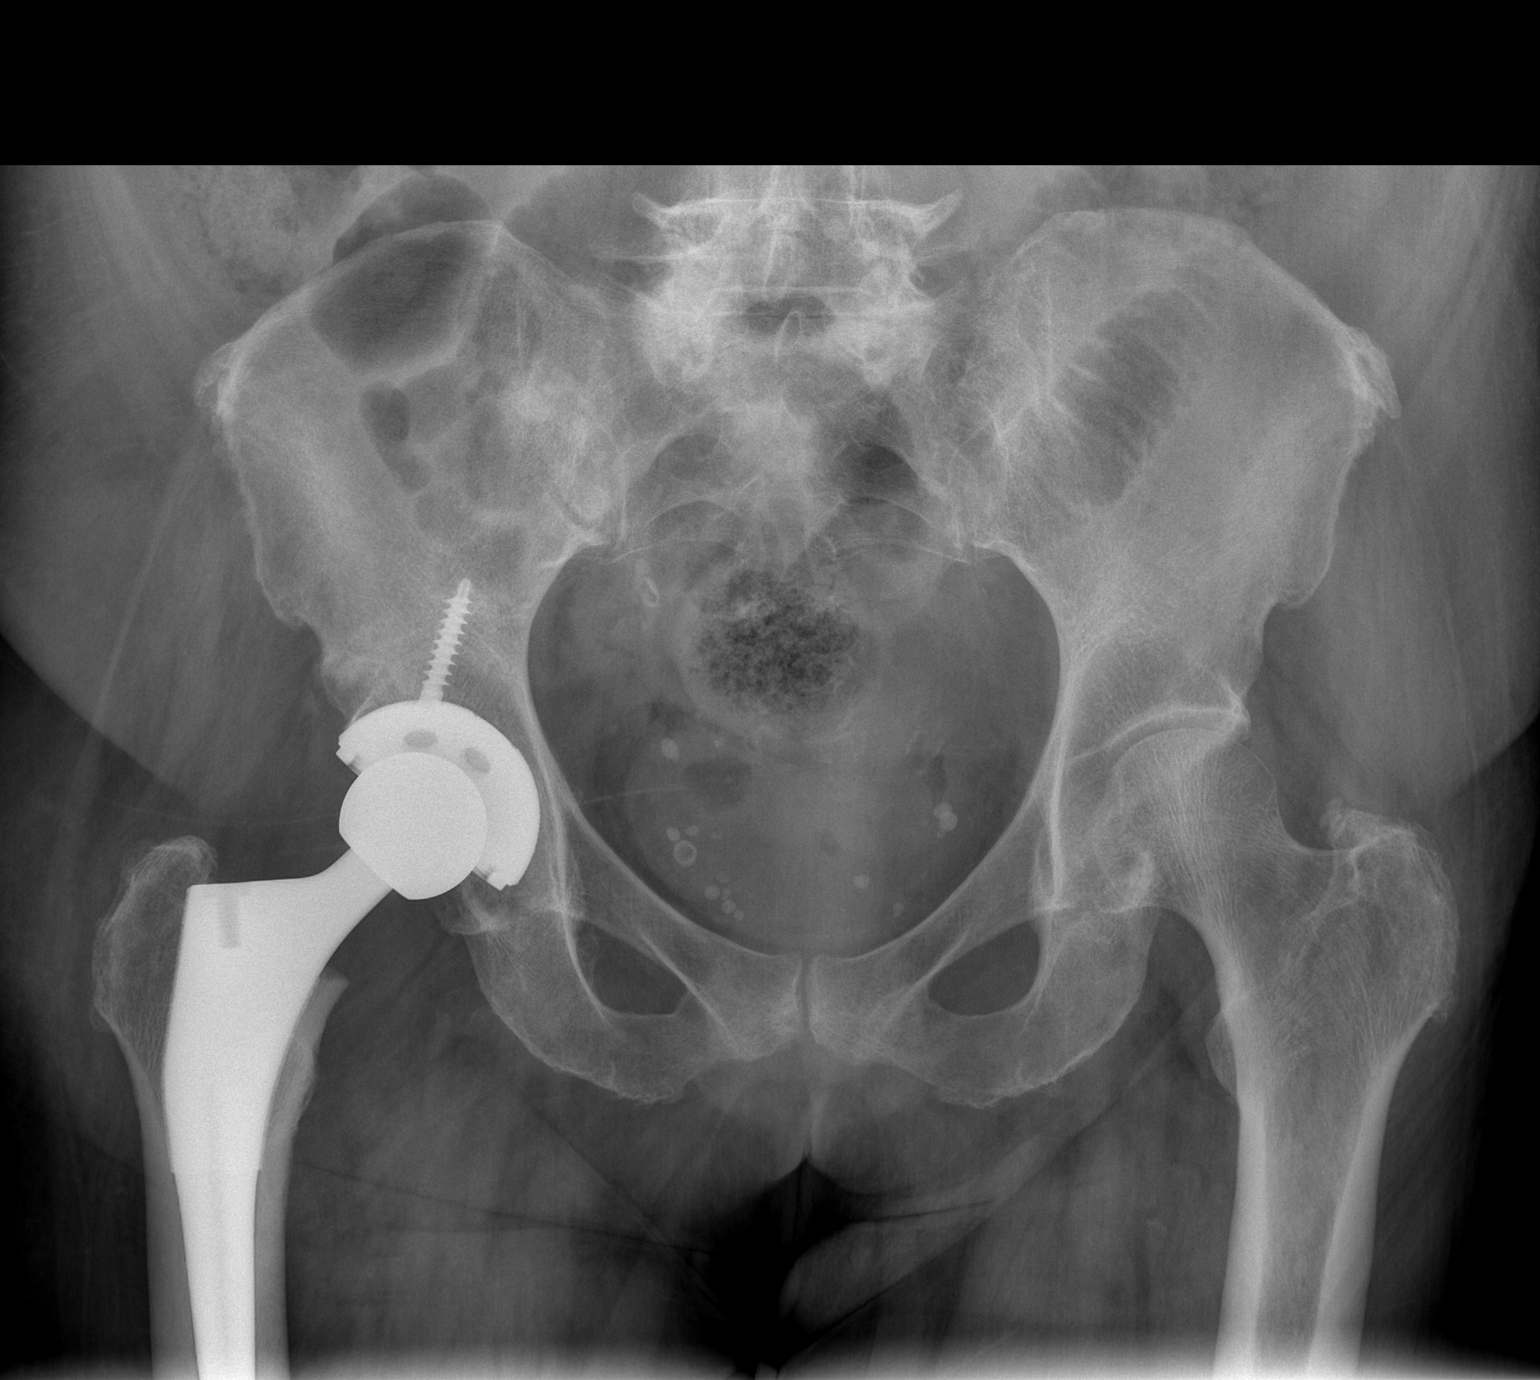

[t hip ap right]
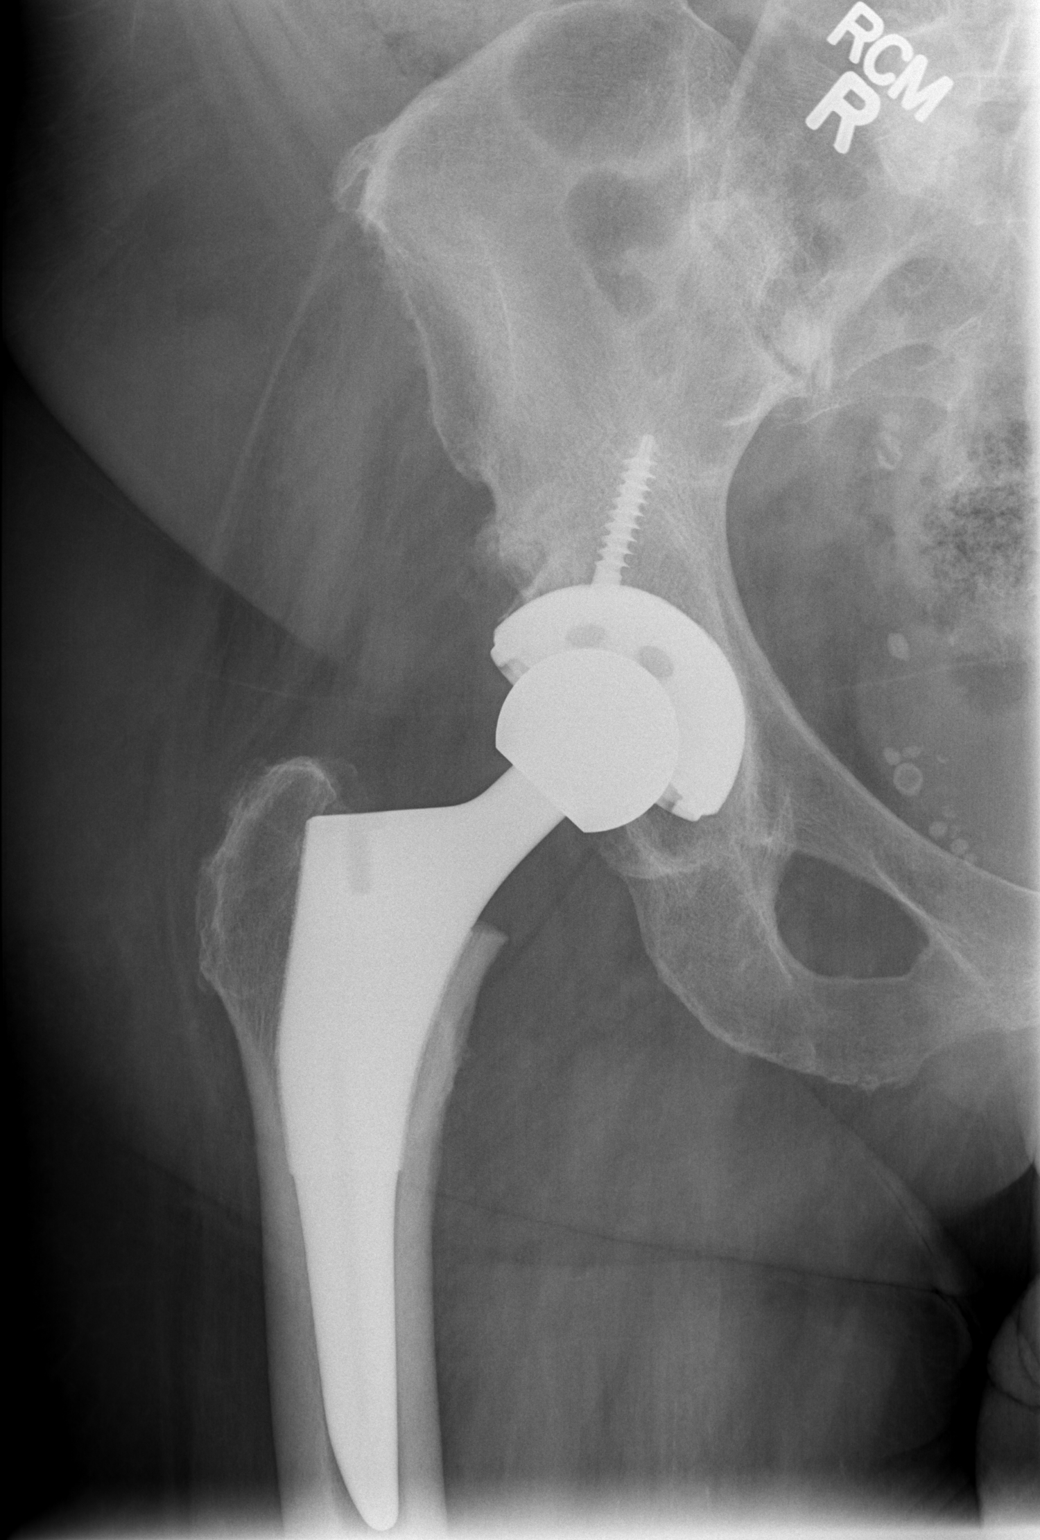

[t hip frog leg right]
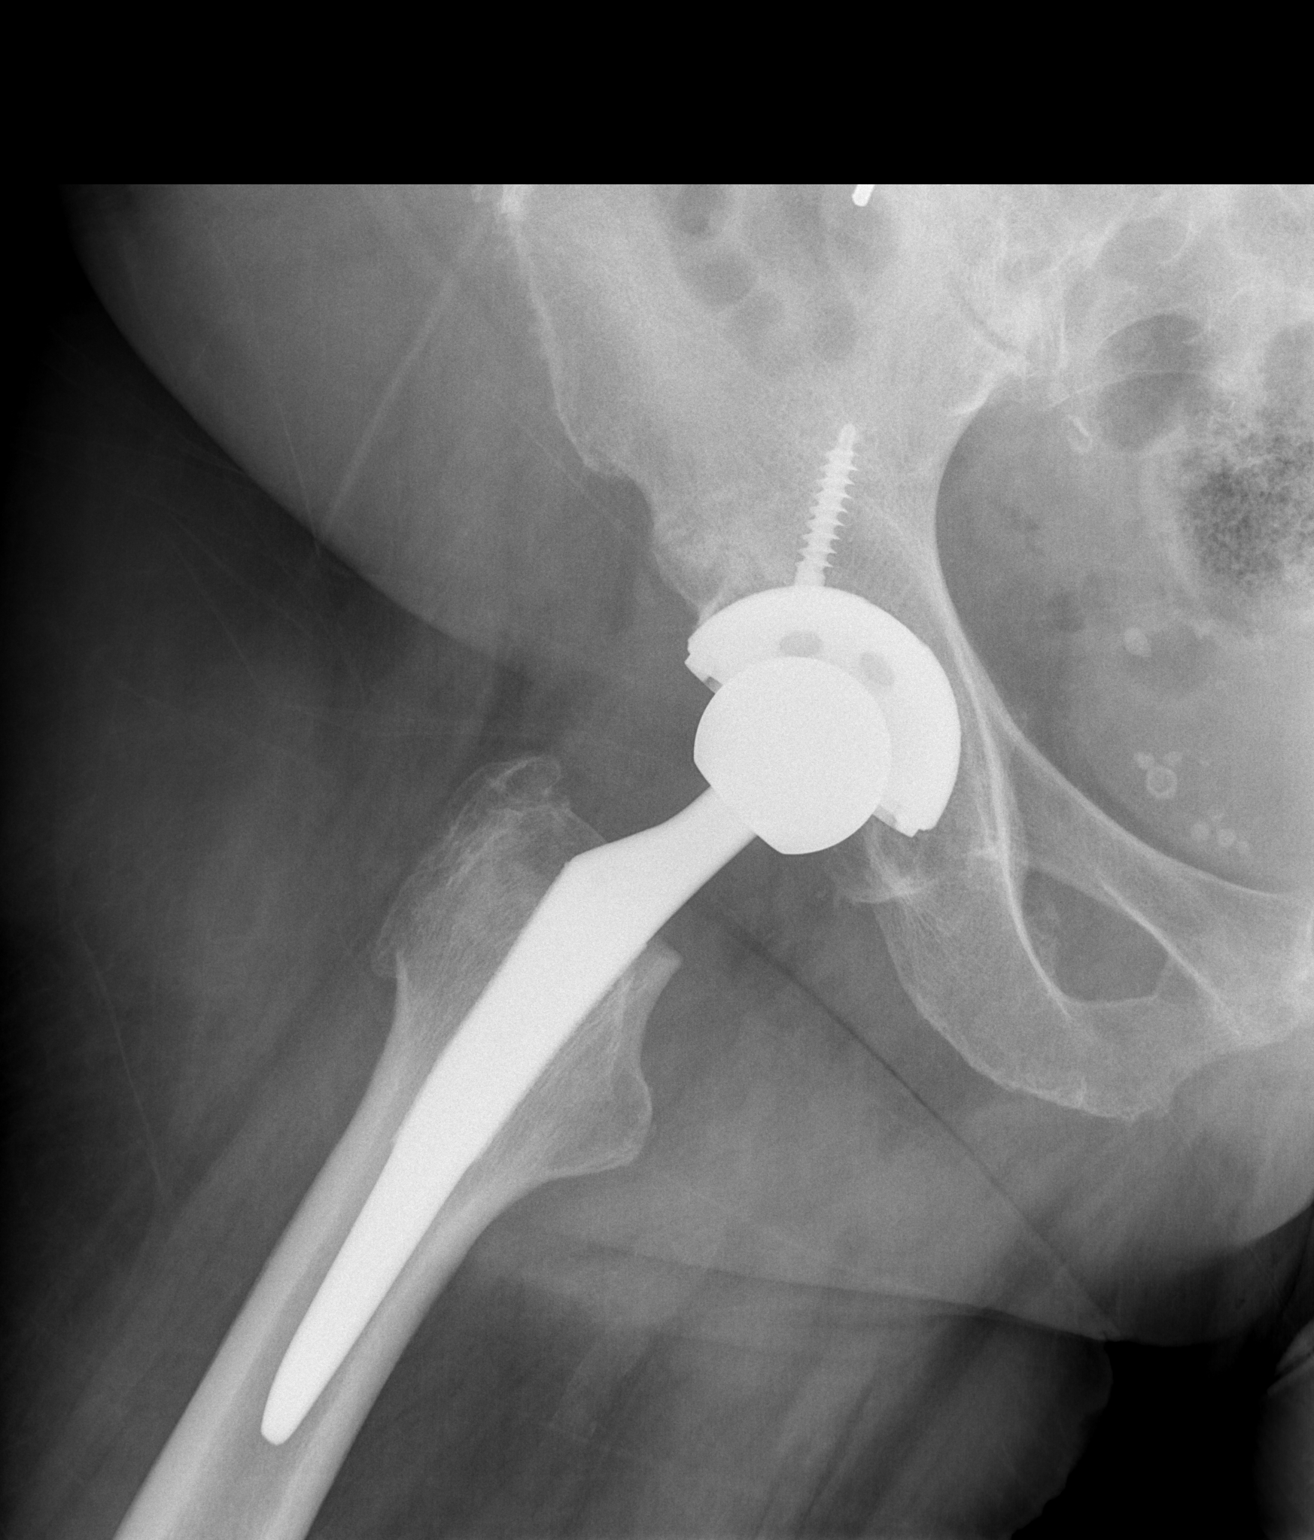

[3 of 3 positions shown; findings below may reference images not displayed]

FINDINGS: Right total hip arthroplasty with anatomic alignment.  No
periprosthetic lucency to suggest loosening.  No acute fractures.
No evidence of prosthesis dislocation.

Included AP pelvis shows an intact left hip joint with well-
preserved joint space.  Sacroiliac joints symphysis pubis intact.
Degenerative changes involving the visualized lower lumbar spine.
IMPRESSION: Right total hip arthroplasty with anatomic alignment and no
complicating features.  No acute osseous abnormalities.

## 2012-04-16 IMAGING — US US EXTREM LOW*R* COMPLETE
1 series · 14 of 16 positions shown · non-contrast
Comparison: None.

CLINICAL DATA: Recent right total hip arthroplasty.  Postoperative
fever.  Aspiration of the right hip by Dr. perhaps revealed
purulent fluid.  Ultrasound requested to evaluate for abscess.

ULTRASOUND RIGHT LOWER EXTREMITY LIMITED 11/22/2009:
TECHNIQUE: Ultrasound examination of the region of interest in the
right lower extremity was performed.

[Series 1: us extrem low*right* complete · 0.22mm/px · 14 of 16 slices shown]
[im 1/16]
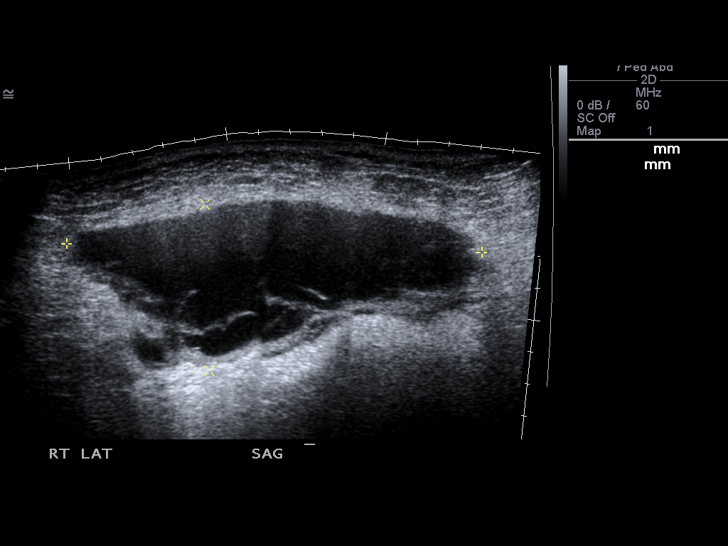
[im 2/16]
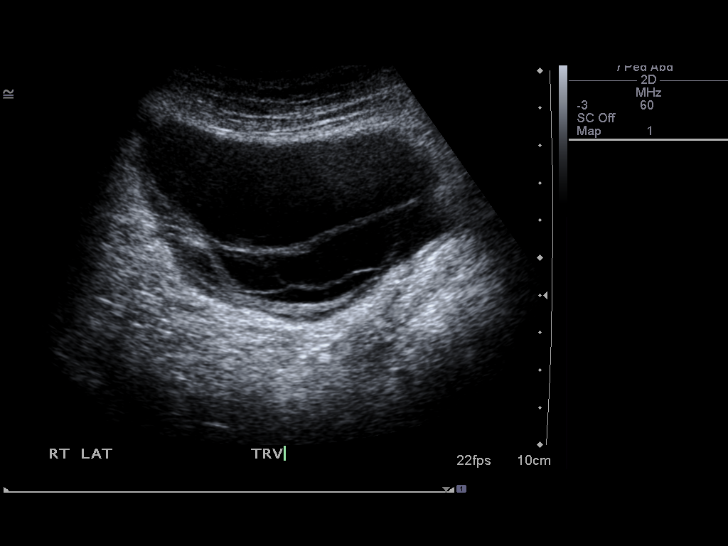
[im 3/16]
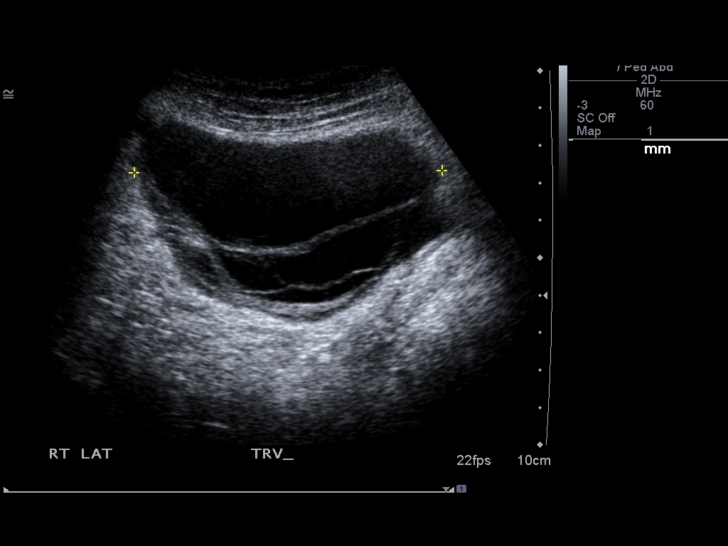
[im 5/16]
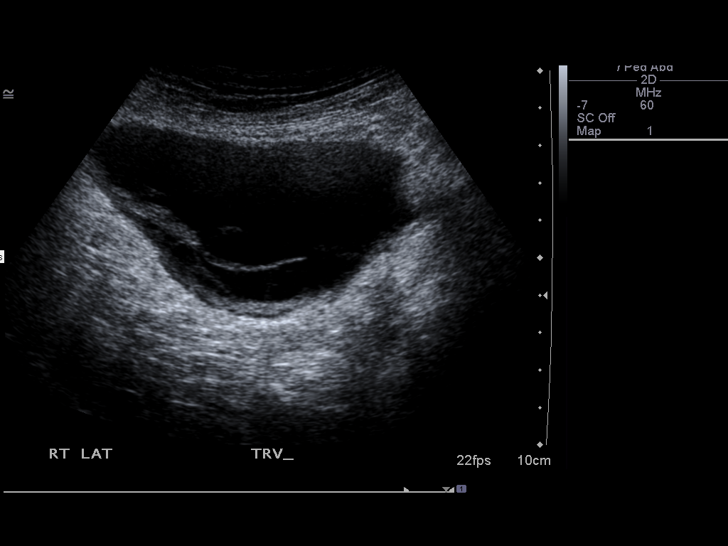
[im 6/16]
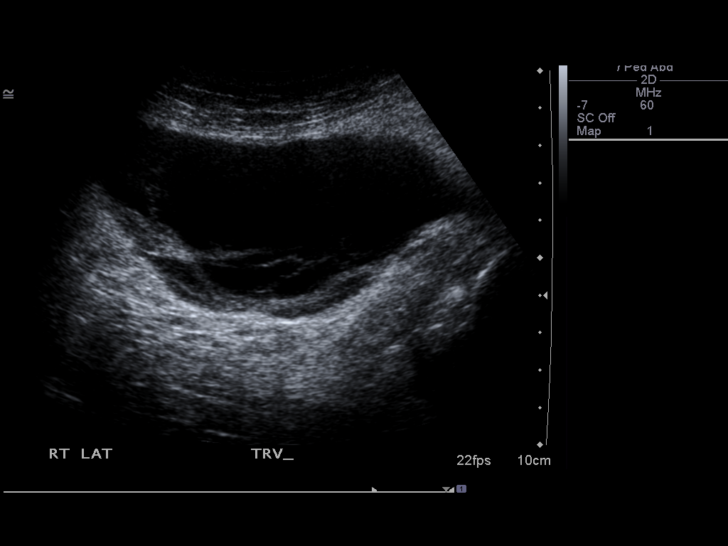
[im 7/16]
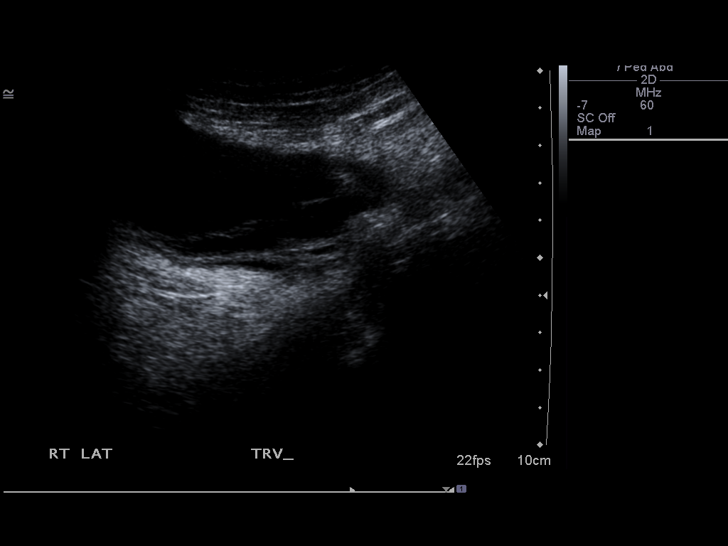
[im 8/16]
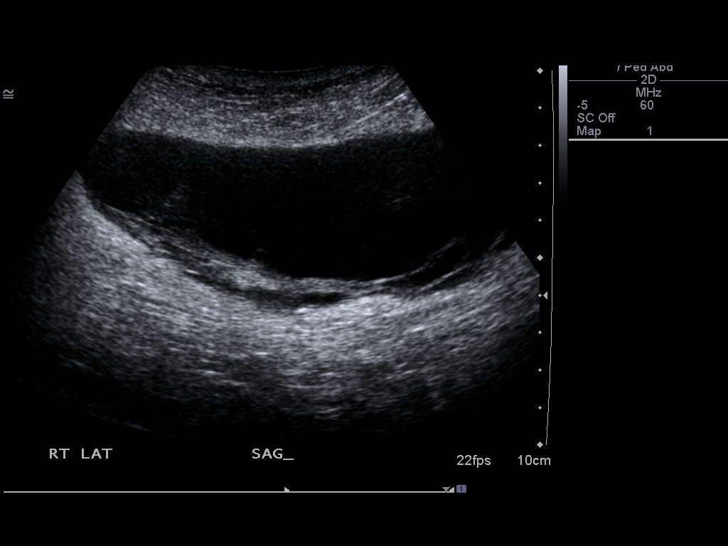
[im 9/16]
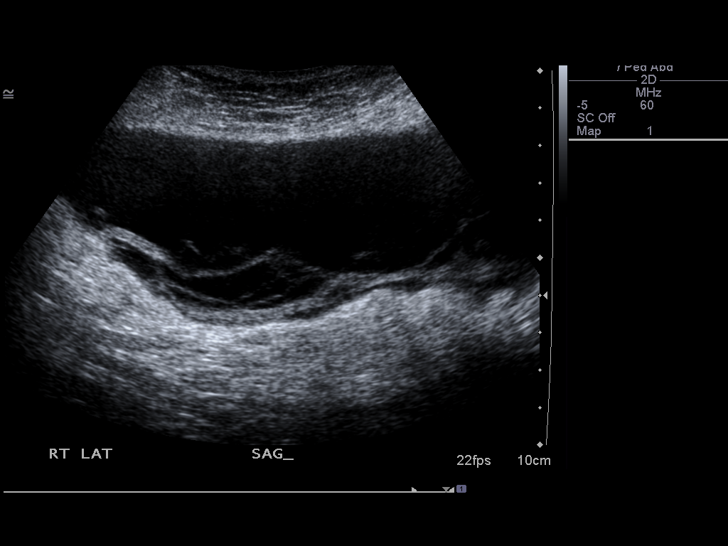
[im 10/16]
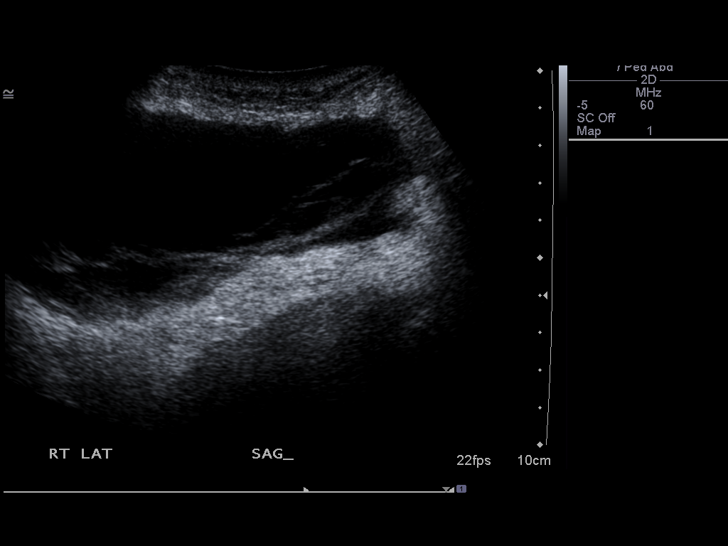
[im 11/16]
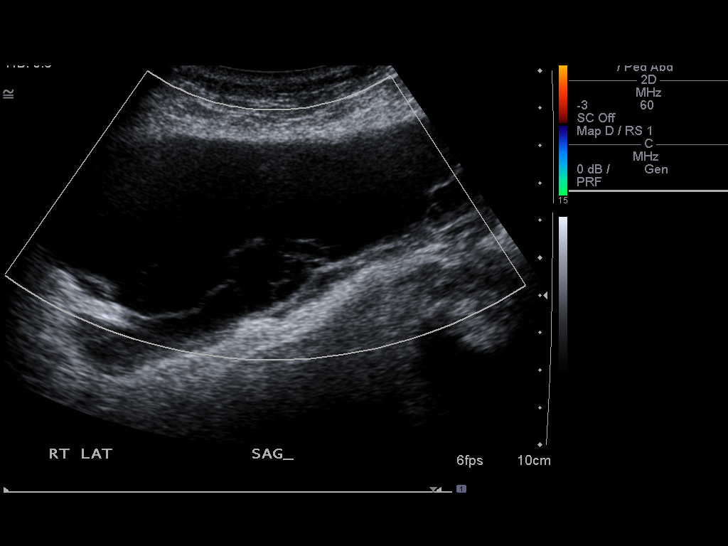
[im 13/16]
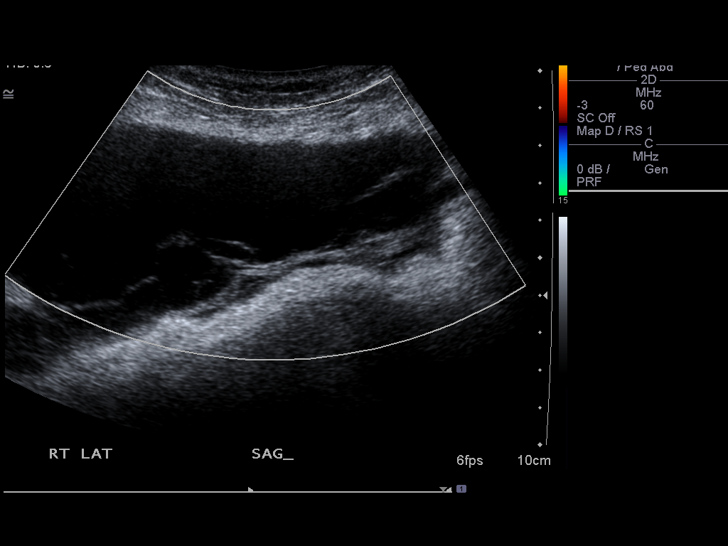
[im 14/16]
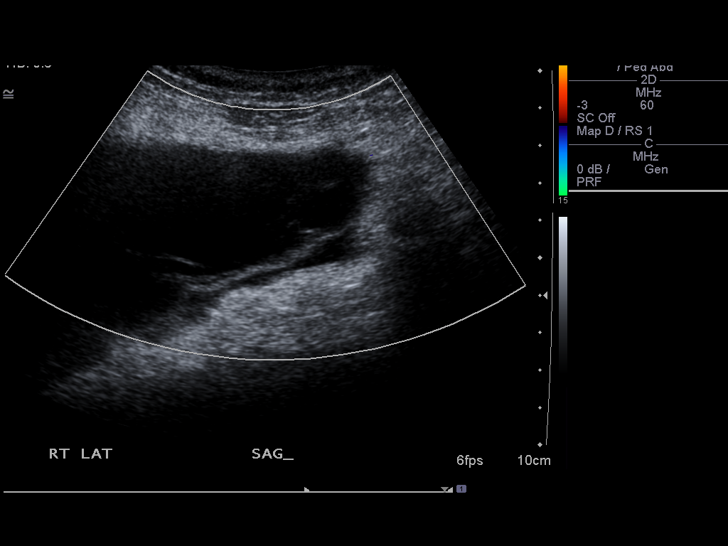
[im 15/16]
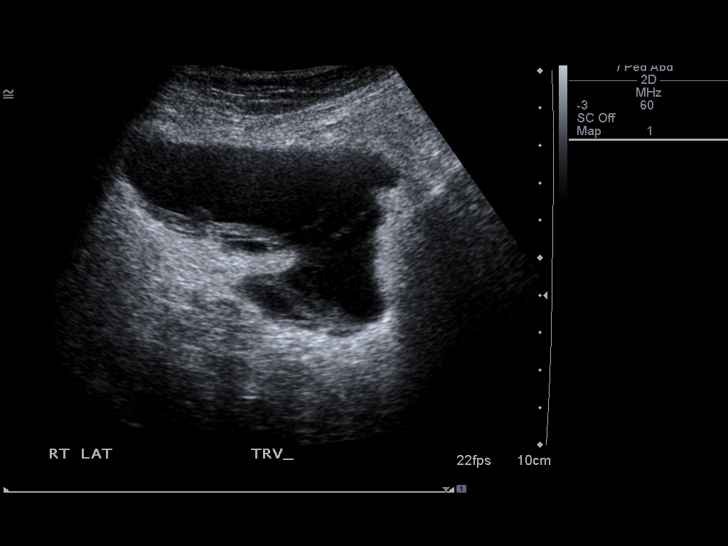
[im 16/16]
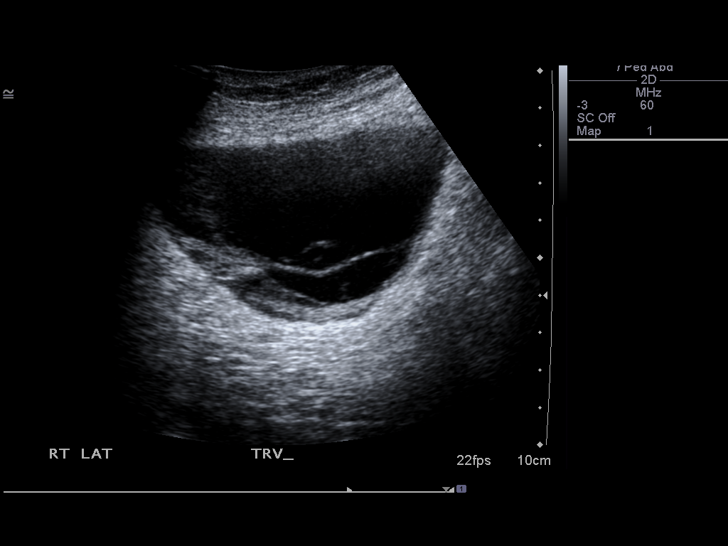

[14 of 16 positions shown; findings below may reference images not displayed]

FINDINGS: Imaging in the lateral aspect of the proximal right thigh
demonstrates a large complex fluid collection without internal
color Doppler signal.  The collection measures approximately 13.0 x
5.2 x 8.2 cm.
IMPRESSION: Large abscess versus resolving hematoma involving the lateral
aspect of the proximal right thigh, superficial to the right hip.

## 2012-04-17 ENCOUNTER — Other Ambulatory Visit (HOSPITAL_COMMUNITY): Payer: Self-pay | Admitting: Cardiology

## 2012-04-17 DIAGNOSIS — R079 Chest pain, unspecified: Secondary | ICD-10-CM

## 2012-04-17 DIAGNOSIS — I739 Peripheral vascular disease, unspecified: Secondary | ICD-10-CM

## 2012-04-24 ENCOUNTER — Ambulatory Visit (HOSPITAL_COMMUNITY)
Admission: RE | Admit: 2012-04-24 | Discharge: 2012-04-24 | Disposition: A | Discharge status: Home / Self Care | Payer: No Typology Code available for payment source | Source: Ambulatory Visit | Attending: Cardiovascular Disease | Admitting: Cardiovascular Disease

## 2012-04-24 DIAGNOSIS — R0789 Other chest pain: Secondary | ICD-10-CM | POA: Insufficient documentation

## 2012-04-24 DIAGNOSIS — R42 Dizziness and giddiness: Secondary | ICD-10-CM | POA: Insufficient documentation

## 2012-04-24 DIAGNOSIS — I10 Essential (primary) hypertension: Secondary | ICD-10-CM | POA: Insufficient documentation

## 2012-04-24 DIAGNOSIS — R0602 Shortness of breath: Secondary | ICD-10-CM | POA: Insufficient documentation

## 2012-04-24 DIAGNOSIS — I739 Peripheral vascular disease, unspecified: Secondary | ICD-10-CM | POA: Insufficient documentation

## 2012-04-24 DIAGNOSIS — F17200 Nicotine dependence, unspecified, uncomplicated: Secondary | ICD-10-CM | POA: Insufficient documentation

## 2012-04-24 DIAGNOSIS — E119 Type 2 diabetes mellitus without complications: Secondary | ICD-10-CM | POA: Insufficient documentation

## 2012-04-24 DIAGNOSIS — R079 Chest pain, unspecified: Secondary | ICD-10-CM

## 2012-04-24 HISTORY — PX: CARDIOVASCULAR STRESS TEST: SHX262

## 2012-04-24 MED ORDER — REGADENOSON 0.4 MG/5ML IV SOLN
0.4000 mg | Freq: Once | INTRAVENOUS | Status: AC
  Administered 2012-04-24: 0.4 mg via INTRAVENOUS

## 2012-04-24 MED ORDER — TECHNETIUM TC 99M SESTAMIBI GENERIC - CARDIOLITE
31.0000 | Freq: Once | INTRAVENOUS | Status: AC | PRN
  Administered 2012-04-24: 31 via INTRAVENOUS

## 2012-04-24 MED ORDER — TECHNETIUM TC 99M SESTAMIBI GENERIC - CARDIOLITE
11.0000 | Freq: Once | INTRAVENOUS | Status: AC | PRN
  Administered 2012-04-24: 11 via INTRAVENOUS

## 2012-04-24 MED ORDER — AMINOPHYLLINE 25 MG/ML IV SOLN
75.0000 mg | Freq: Once | INTRAVENOUS | Status: AC
  Administered 2012-04-24: 75 mg via INTRAVENOUS

## 2012-04-24 NOTE — Procedures (Addendum)
[place]  [facility] [address] [place] [address] [phone]  Cardiology Nuclear Med Study  Leah Wiggins is a 79 y.o. female     MRN : [id number]     DOB: [birth year]  Procedure Date: 04/24/2012  Nuclear Med Background Indication for Stress Test:  Evaluation for Ischemia History:  NO PRIOR CARDIAC HISTORY Cardiac Risk Factors: Family History - CAD, Hypertension, Lipids, NIDDM, Overweight, PVD and Smoker  Symptoms:  Light-Headedness, SOB and Chest Tightness   Nuclear Pre-Procedure Caffeine/Decaff Intake:  8:00pm NPO After: 6:00am   IV Site: L Antecubital  IV 0.9% NS with Angio Cath:  22g  Chest Size (in):  n/a IV Started by: Emmit Pomfret, RN  Height: 5\' 3"  (1.6 m)  Cup Size: b  BMI:  Body mass index is 30.83 kg/(m^2). Weight:  174 lb (78.926 kg)   Tech Comments:  n/a    Nuclear Med Study 1 or 2 day study: 1 day  Stress Test Type:  Lexiscan  Order Authorizing Provider:  Zoila Shutter, MD   Resting Radionuclide: Technetium 34m Sestamibi  Resting Radionuclide Dose: 11.0 mCi   Stress Radionuclide:  Technetium 58m Sestamibi  Stress Radionuclide Dose: 31.0 mCi           Stress Protocol Rest HR: 62 Stress HR: 93  Rest BP: 168/83 Stress BP: 191/80  Exercise Time (min): n/a METS: n/a   Predicted Max HR: 137 bpm % Max HR: 67.88 bpm Rate Pressure Product: 46962  Dose of Adenosine (mg):  n/a Dose of Lexiscan: 0.4 mg  Dose of Atropine (mg): n/a Dose of Dobutamine: n/a mcg/kg/min (at max HR)  Stress Test Technologist: Esperanza Sheets, CCT Nuclear Technologist: Gonzella Lex, CNMT   Rest Procedure:  Myocardial perfusion imaging was performed at rest 45 minutes following the intravenous administration of Technetium 27m Sestamibi. Stress Procedure:  The patient received IV Lexiscan 0.4 mg over 15-seconds.  Technetium 93m Sestamibi injected at 30-seconds.  The patient experienced SOB, Headache and Stomach Pains and was  given 75mg  IV Aminophylline with resolution of all symptoms.  There were no significant changes with Lexiscan.  Quantitative spect images were obtained after a 45 minute delay.  Transient Ischemic Dilatation (Normal <1.22):  1.05 Lung/Heart Ratio (Normal <0.45):  0.30 QGS EDV:  50 ml QGS ESV:  11 ml LV Ejection Fraction: 78%  Signed by .  Rest ECG: NSR with non-specific ST-T wave changes  Stress ECG: No significant change from baseline ECG  QPS Raw Data Images:  Normal; no motion artifact; normal heart/lung ratio. Stress Images:  Normal homogeneous uptake in all areas of the myocardium. Rest Images:  Normal homogeneous uptake in all areas of the myocardium. Subtraction (SDS):  Normal  Impression Exercise Capacity:  Lexiscan with no exercise. BP Response:  Normal blood pressure response. Clinical Symptoms:  No significant symptoms noted. ECG Impression:  No significant ST segment change suggestive of ischemia. Comparison with Prior Nuclear Study: No significant change from previous study in 2011  Overall Impression:  Normal stress nuclear study. No reversible ischemia.  LV Wall Motion:  NL LV Function; NL Wall Motion; LVEF 78%.  Chrystie Nose, MD, [facility] Attending Cardiologist [facility]   Chrystie Nose, MD  04/24/2012 12:50 PM

## 2012-04-28 ENCOUNTER — Telehealth: Payer: Self-pay | Admitting: Oncology

## 2012-04-28 ENCOUNTER — Encounter: Payer: Self-pay | Admitting: Oncology

## 2012-05-07 ENCOUNTER — Ambulatory Visit (HOSPITAL_COMMUNITY)
Admission: RE | Admit: 2012-05-07 | Discharge: 2012-05-07 | Disposition: A | Discharge status: Home / Self Care | Payer: No Typology Code available for payment source | Source: Ambulatory Visit | Attending: Cardiovascular Disease | Admitting: Cardiovascular Disease

## 2012-05-07 DIAGNOSIS — I739 Peripheral vascular disease, unspecified: Secondary | ICD-10-CM | POA: Insufficient documentation

## 2012-05-07 HISTORY — PX: OTHER SURGICAL HISTORY: SHX169

## 2012-05-07 NOTE — Progress Notes (Signed)
BLE Arterial Duplex Completed. Leah Wiggins D  

## 2012-07-25 ENCOUNTER — Ambulatory Visit: Payer: No Typology Code available for payment source | Admitting: Hematology and Oncology

## 2012-07-25 ENCOUNTER — Encounter: Payer: Self-pay | Admitting: Oncology

## 2012-07-25 ENCOUNTER — Other Ambulatory Visit (HOSPITAL_BASED_OUTPATIENT_CLINIC_OR_DEPARTMENT_OTHER): Payer: No Typology Code available for payment source | Admitting: Lab

## 2012-07-25 ENCOUNTER — Ambulatory Visit (HOSPITAL_BASED_OUTPATIENT_CLINIC_OR_DEPARTMENT_OTHER): Payer: No Typology Code available for payment source | Admitting: Oncology

## 2012-07-25 ENCOUNTER — Other Ambulatory Visit: Payer: No Typology Code available for payment source

## 2012-07-25 ENCOUNTER — Telehealth: Payer: Self-pay | Admitting: Oncology

## 2012-07-25 VITALS — BP 139/47 | HR 70 | Temp 98.4°F | Resp 18 | Ht 63.0 in | Wt 171.6 lb

## 2012-07-25 DIAGNOSIS — F17200 Nicotine dependence, unspecified, uncomplicated: Secondary | ICD-10-CM

## 2012-07-25 DIAGNOSIS — D696 Thrombocytopenia, unspecified: Secondary | ICD-10-CM

## 2012-07-25 DIAGNOSIS — Z853 Personal history of malignant neoplasm of breast: Secondary | ICD-10-CM

## 2012-07-25 LAB — CBC WITH DIFFERENTIAL/PLATELET
BASO%: 0.6 % (ref 0.0–2.0)
Basophils Absolute: 0 10*3/uL (ref 0.0–0.1)
EOS%: 1.5 % (ref 0.0–7.0)
Eosinophils Absolute: 0.1 10*3/uL (ref 0.0–0.5)
HCT: 38.4 % (ref 34.8–46.6)
HGB: 12.6 g/dL (ref 11.6–15.9)
LYMPH%: 26.5 % (ref 14.0–49.7)
MCH: 25.6 pg (ref 25.1–34.0)
MCHC: 32.8 g/dL (ref 31.5–36.0)
MCV: 78.1 fL — ABNORMAL LOW (ref 79.5–101.0)
MONO#: 0.5 10*3/uL (ref 0.1–0.9)
MONO%: 8 % (ref 0.0–14.0)
NEUT#: 3.6 10*3/uL (ref 1.5–6.5)
NEUT%: 63.4 % (ref 38.4–76.8)
Platelets: 129 10*3/uL — ABNORMAL LOW (ref 145–400)
RBC: 4.91 10*6/uL (ref 3.70–5.45)
RDW: 17.1 % — ABNORMAL HIGH (ref 11.2–14.5)
WBC: 5.7 10*3/uL (ref 3.9–10.3)
lymph#: 1.5 10*3/uL (ref 0.9–3.3)

## 2012-07-25 LAB — BASIC METABOLIC PANEL (CC13)
BUN: 14.2 mg/dL (ref 7.0–26.0)
CO2: 24 mEq/L (ref 22–29)
Calcium: 9 mg/dL (ref 8.4–10.4)
Chloride: 110 mEq/L — ABNORMAL HIGH (ref 98–107)
Creatinine: 1 mg/dL (ref 0.6–1.1)
Glucose: 99 mg/dl (ref 70–99)
Potassium: 4 mEq/L (ref 3.5–5.1)
Sodium: 143 mEq/L (ref 136–145)

## 2012-07-25 NOTE — Patient Instructions (Signed)
Mammogram at [facility] in Sept (last 11-29-11)  Stop smoking!  Call if bleeding or significant bruising before next appointment

## 2012-07-25 NOTE — Telephone Encounter (Signed)
gv and printed appt sched and avs for pt  °

## 2012-07-25 NOTE — Progress Notes (Signed)
OFFICE PROGRESS NOTE   07/25/2012   Physicians: Dorothyann Peng, Nanetta Batty, C.vanDam  INTERVAL HISTORY:   Patient is a very pleasant 79 yo lady whom I am seeing for the first time today, together with daughter, previously followed by Dr Dalene Carrow since 2013 for mild thrombocytopenia. Primary care is by Dr Allyne Gee, whom she sees every 3-6 months.  She also has history of right breast cancer in 1991, post mastectomy with axillary node evaluation by Dr Leonie Man after which she apparently was on tamoxifen x 5 years.  Patient was referred to Dr Dalene Carrow by Dr Allyne Gee with thrombocytopenia, platelet count in low 100s for at least several years and no bleeding problems. WBC and Hgb were not remarkable. She had normal size spleen by [place] 01-2012, SPEP normal and B12 normal.She did have a few giant platelets on smear then.   Review of Systems She has felt well overall since she was here last, with no bleeding or unusual bruising, no fever or symptoms of infection, good appetite and energy, no new or different pain. She checks blood sugars 2-3x daily, 70 but asymptomatic this am. She has fungal skin infection on feet and may want to see podiatrist. She has not noticed any change in left breast. Swelling in RUE since right mastectomy is stable and not bothersome to her. No problems with bowels. No SOB or cough and no chest pain. Good vision. Puritic rash upper chest, better with topical from Dr Allyne Gee yesterday. Remainder of 10 point Review of Systems negative.  PMH Allergy PCN Right breast cancer 1610 R6E4 (two sets of twins) Diabetes x years HTN, elevated lipids   Family History "leukemia" in sibling "stomach cancer" in sibling Children and grandchildren healthy  Social History Originally from [place]. Lives alone in [place] with 8 children and 25-30 grands/ great grands locally. 1/2 ppd smoker since age 63, quit once but resumed. No ETOH.   Objective:  Vital signs in last 24  hours:  BP 139/47  Pulse 70  Temp(Src) 98.4 F (36.9 C) (Oral)  Resp 18  Ht 5\' 3"  (1.6 m)  Wt 171 lb 9.6 oz (77.837 kg)  BMI 30.41 kg/m2 Alert, somewhat deaf, easily ambulatory, daughter very supportive.   HEENT:PERRLA, sclera clear, anicteric and oropharynx clear, no lesions Normal hair pattern. LymphaticsCervical, supraclavicular, and axillary nodes normal. No inguinal adenopathy Resp: somewhat diminished breath sounds bilaterally, clear to percussion Cardio: regular rate and rhythm GI: soft, non-tender; bowel sounds normal; no masses,  no organomegaly Extremities: extremities normal, atraumatic, no cyanosis or edema Neuro:mild deafness Breast: left with no mass and no skin or nipple findings of concern, left axilla benign and no swelling LUE. Right mastectomy scar well healed without evidence of local recurrence. Right axilla not remarkable. 1-2+ swelling RUE without erythema or cords. Skin without ecchymoses or petechiae. Scattered slightly erythematous rash anterior chest. Skin peeling on heels consistent with fungal infection. Patchy whitish changes right instep.  Lab Results:  Results for orders placed in visit on 07/25/12  CBC WITH DIFFERENTIAL      Result Value Range   WBC 5.7  3.9 - 10.3 10e3/uL   NEUT# 3.6  1.5 - 6.5 10e3/uL   HGB 12.6  11.6 - 15.9 g/dL   HCT 54.0  98.1 - 19.1 %   Platelets 129 (*) 145 - 400 10e3/uL   MCV 78.1 (*) 79.5 - 101.0 fL   MCH 25.6  25.1 - 34.0 pg   MCHC 32.8  31.5 - 36.0 g/dL  RBC 4.91  3.70 - 5.45 10e6/uL   RDW 17.1 (*) 11.2 - 14.5 %   lymph# 1.5  0.9 - 3.3 10e3/uL   MONO# 0.5  0.1 - 0.9 10e3/uL   Eosinophils Absolute 0.1  0.0 - 0.5 10e3/uL   Basophils Absolute 0.0  0.0 - 0.1 10e3/uL   NEUT% 63.4  38.4 - 76.8 %   LYMPH% 26.5  14.0 - 49.7 %   MONO% 8.0  0.0 - 14.0 %   EOS% 1.5  0.0 - 7.0 %   BASO% 0.6  0.0 - 2.0 %  BASIC METABOLIC PANEL (CC13)      Result Value Range   Sodium 143  136 - 145 mEq/L   Potassium 4.0  3.5 - 5.1 mEq/L    Chloride 110 (*) 98 - 107 mEq/L   CO2 24  22 - 29 mEq/L   Glucose 99  70 - 99 mg/dl   BUN 98.1  7.0 - 19.1 mg/dL   Creatinine 1.0  0.6 - 1.1 mg/dL   Calcium 9.0  8.4 - 47.8 mg/dL     Studies/Results: I have [facility], and learned that mammograms are at [facility]. Report of most recent mammogram 11-29-11 requested and will be scanned into this EMR when received.  Medications: I have reviewed the patient's current medications.  Assessment/Plan:  1. Mild thrombocytopenia: stable and no other findings of concern. I agree with Dr Dalene Carrow that bone marrow exam does not seem necessary now, which patient also does not want. We will ask Dr Allyne Gee to repeat CBC in ~ 6 months and will see her back at this office in a year, or sooner if needed 2.remote right breast cancer: no known active disease now out 23 years from diagnosis. Up to date on mammograms at [facility] as above 3.ongoing cigarette smoking: we have discussed cessation strategies, daughter very supportive. 4.Diabetes, seems well controlled 5.left hip septic arthritis with MRSA ~ 2011, followed by infectious disease   Patient and daughter are comfortable with discussion and plan.   LIVESAY,LENNIS P, MD   07/25/2012, 11:21 AM

## 2012-07-27 ENCOUNTER — Encounter: Payer: Self-pay | Admitting: Oncology

## 2012-07-27 NOTE — Progress Notes (Signed)
Medical Oncology  Report received from [facility] of left mammogram done 11-29-11, stable with no mammographic findings of concern and follow up recommended in one year. Report to be scanned into this EMR.  Ila Mcgill, MD

## 2012-09-19 ENCOUNTER — Encounter (HOSPITAL_COMMUNITY): Payer: Self-pay | Admitting: Emergency Medicine

## 2012-09-19 ENCOUNTER — Emergency Department (HOSPITAL_COMMUNITY)
Admission: EM | Admit: 2012-09-19 | Discharge: 2012-09-19 | Disposition: A | Discharge status: Home / Self Care | Payer: No Typology Code available for payment source | Attending: Emergency Medicine | Admitting: Emergency Medicine

## 2012-09-19 DIAGNOSIS — R109 Unspecified abdominal pain: Secondary | ICD-10-CM | POA: Insufficient documentation

## 2012-09-19 DIAGNOSIS — R300 Dysuria: Secondary | ICD-10-CM | POA: Insufficient documentation

## 2012-09-19 DIAGNOSIS — L299 Pruritus, unspecified: Secondary | ICD-10-CM

## 2012-09-19 DIAGNOSIS — Z79899 Other long term (current) drug therapy: Secondary | ICD-10-CM | POA: Insufficient documentation

## 2012-09-19 DIAGNOSIS — F17200 Nicotine dependence, unspecified, uncomplicated: Secondary | ICD-10-CM | POA: Insufficient documentation

## 2012-09-19 DIAGNOSIS — E119 Type 2 diabetes mellitus without complications: Secondary | ICD-10-CM | POA: Insufficient documentation

## 2012-09-19 DIAGNOSIS — Z7982 Long term (current) use of aspirin: Secondary | ICD-10-CM | POA: Insufficient documentation

## 2012-09-19 DIAGNOSIS — Z87440 Personal history of urinary (tract) infections: Secondary | ICD-10-CM | POA: Insufficient documentation

## 2012-09-19 DIAGNOSIS — Z853 Personal history of malignant neoplasm of breast: Secondary | ICD-10-CM | POA: Insufficient documentation

## 2012-09-19 DIAGNOSIS — Z880 Allergy status to penicillin: Secondary | ICD-10-CM | POA: Insufficient documentation

## 2012-09-19 LAB — URINALYSIS, ROUTINE W REFLEX MICROSCOPIC
Bilirubin Urine: NEGATIVE
Glucose, UA: NEGATIVE mg/dL
Ketones, ur: NEGATIVE mg/dL
Nitrite: NEGATIVE
Protein, ur: NEGATIVE mg/dL
Specific Gravity, Urine: 1.022 (ref 1.005–1.030)
Urobilinogen, UA: 0.2 mg/dL (ref 0.0–1.0)
pH: 5.5 (ref 5.0–8.0)

## 2012-09-19 LAB — URINE MICROSCOPIC-ADD ON

## 2012-09-19 MED ORDER — PHENAZOPYRIDINE HCL 200 MG PO TABS: 200.0000 mg | ORAL_TABLET | Freq: Three times a day (TID) | ORAL | Status: DC | PRN

## 2012-09-19 NOTE — ED Notes (Signed)
Family at bedside. 

## 2012-09-19 NOTE — ED Notes (Signed)
To ED via private vehicle with granddaughter-- with multiple complaints-- "itching with lumps under my skin-- you can't see them, but you can feel them-- none are here right now" states happens every day-- also c/o burning on urination for a week.

## 2012-09-19 NOTE — ED Provider Notes (Signed)
History    CSN: [id number] Arrival date & time 09/19/12  1000  First MD Initiated Contact with Patient 09/19/12 1016     Chief Complaint  Patient presents with  . Dysuria  . Pruritis   (Consider location/radiation/quality/duration/timing/severity/associated sxs/prior Treatment) HPI Pt is an 79yo female presenting today with intermittent bilateral arm pruritis without rash that started 1 week ago.  Pt also c/o intermittent burning with urination and mild lower abdominal pain.  Nothing seems to make symptoms better or worse.  Pt does report being tx for UTI 2-3 weeks ago.  Does not recall antibiotic but states bactrim sounds familiar.  Pt has no known allergies and has not started any new medication besides antibiotics 2-3 weeks ago.  Pt states she itches in her arms and back of her neck, worse at night but never sees a rash.  Denies any new bedding or clothing.  Denies recent travel or hotel stay.     Past Medical History  Diagnosis Date  . Breast cancer 1991  . Diabetes mellitus    Past Surgical History  Procedure Laterality Date  . Mastectomy partial / lumpectomy w/ axillary lymphadenectomy  1991     Right  . Total hip arthroplasty  2011    R   History reviewed. No pertinent family history. History  Substance Use Topics  . Smoking status: Current Every Day Smoker -- 0.50 packs/day for 60 years    Types: Cigarettes    Start date: 03/08/1947  . Smokeless tobacco: Not on file  . Alcohol Use: No   OB History   Grav Para Term Preterm Abortions TAB SAB Ect Mult Living                 Review of Systems  Constitutional: Negative for fever and chills.  Gastrointestinal: Positive for abdominal pain ( lower abdomen). Negative for nausea, vomiting and diarrhea.  Genitourinary: Positive for dysuria. Negative for urgency, frequency, hematuria and flank pain.  Skin: Negative for rash.  All other systems reviewed and are negative.    Allergies  Penicillins  Home Medications    Current Outpatient Rx  Name  Route  Sig  Dispense  Refill  . alendronate (FOSAMAX) 70 MG tablet   Oral   Take 70 mg by mouth every 7 (seven) days. Take with a full glass of water on an empty stomach. Wednesdays         . aspirin EC 81 MG tablet   Oral   Take 81 mg by mouth daily.         . metFORMIN (GLUCOPHAGE-XR) 500 MG 24 hr tablet   Oral   Take 500 mg by mouth 2 (two) times daily.         Marland Kitchen SPIRIVA HANDIHALER 18 MCG inhalation capsule   Inhalation   Place 18 mcg into inhaler and inhale daily as needed (shortness of breath).          . valsartan (DIOVAN) 160 MG tablet   Oral   Take 160 mg by mouth daily.           Marland Kitchen ADVOCATE REDI-CODE test strip      For Glucose test.         . Lite Touch Lancets MISC               . phenazopyridine (PYRIDIUM) 200 MG tablet   Oral   Take 1 tablet (200 mg total) by mouth 3 (three) times daily as needed for pain.  6 tablet   0    BP 133/55  Pulse 67  Temp(Src) 98.5 F (36.9 C) (Oral)  Resp 16  SpO2 97% Physical Exam  Nursing note and vitals reviewed. Constitutional: She appears well-developed and well-nourished.  Pt sitting comfortably in exam bed, NAD.     HENT:  Head: Normocephalic and atraumatic.  Eyes: Conjunctivae are normal. No scleral icterus.  Neck: Normal range of motion. Neck supple.  Cardiovascular: Normal rate, regular rhythm and normal heart sounds.   Pulmonary/Chest: Effort normal and breath sounds normal.  Abdominal: Soft. Bowel sounds are normal. She exhibits no distension and no mass. There is tenderness ( mild, suprpubic region). There is no rebound and no guarding.  Musculoskeletal: Normal range of motion.  Neurological: She is alert.  Skin: Skin is warm and dry. No rash noted. No erythema.  No erythema or rash seen on arms or neck     ED Course  Procedures (including critical care time) Labs Reviewed  URINALYSIS, ROUTINE W REFLEX MICROSCOPIC - Abnormal; Notable for the following:     Hgb urine dipstick SMALL (*)    Leukocytes, UA SMALL (*)    All other components within normal limits  URINE MICROSCOPIC-ADD ON - Abnormal; Notable for the following:    Squamous Epithelial / LPF FEW (*)    All other components within normal limits   No results found. 1. Dysuria   2. Itching     MDM  Pt likely having fx to antibiotics for UTI 2-3 weeks ago.  Pt may have recurrent UTI.  Will recheck urine.  UA: nl.  Due to pt still having symptoms of UTI will tx symptomatically.  Also advised pt to use OTC benadryl when pruritis recurs.   Rx: pyridium. Will discharge pt home and have her f/u with Dr. Allyne Gee. Pt verbalized understanding and agreement with tx plan. Vitals: unremarkable. Discharged in stable condition.    Discussed pt with attending during ED encounter.    Junius Finner, PA-C 09/19/12 1235

## 2012-09-24 NOTE — ED Provider Notes (Signed)
Medical screening examination/treatment/procedure(s) were conducted as a shared visit with non-physician practitioner(s) and myself.  I personally evaluated the patient during the encounter Pt with mild pruritus bil arm/forearm.  Also notes recent tx uti. Not currently taking any new meds. Ua.   Suzi Roots, MD 09/24/12 [phone]

## 2012-12-10 ENCOUNTER — Encounter: Payer: Self-pay | Admitting: Cardiology

## 2012-12-10 ENCOUNTER — Ambulatory Visit (INDEPENDENT_AMBULATORY_CARE_PROVIDER_SITE_OTHER): Payer: No Typology Code available for payment source | Admitting: Cardiology

## 2012-12-10 VITALS — BP 130/70 | HR 65 | Ht 64.0 in | Wt 173.0 lb

## 2012-12-10 DIAGNOSIS — E785 Hyperlipidemia, unspecified: Secondary | ICD-10-CM | POA: Insufficient documentation

## 2012-12-10 DIAGNOSIS — F17200 Nicotine dependence, unspecified, uncomplicated: Secondary | ICD-10-CM

## 2012-12-10 DIAGNOSIS — O288 Other abnormal findings on antenatal screening of mother: Secondary | ICD-10-CM | POA: Insufficient documentation

## 2012-12-10 DIAGNOSIS — Z720 Tobacco use: Secondary | ICD-10-CM

## 2012-12-10 DIAGNOSIS — I739 Peripheral vascular disease, unspecified: Secondary | ICD-10-CM

## 2012-12-10 HISTORY — DX: Other abnormal findings on antenatal screening of mother: O28.8

## 2012-12-10 HISTORY — DX: Tobacco use: Z72.0

## 2012-12-10 NOTE — Progress Notes (Signed)
12/10/2012   PCP: Gwynneth Aliment, MD   Chief Complaint  Patient presents with  . Follow-up    6 months return visit, states cp with exertion, last time was thursday    Primary Cardiologist: Dr. Allyson Sabal  HPI: 79 year old F with PAD followed medically by Dr. Allyson Sabal.  She continues to smoke, we discussed decreasing to 8 cigarettes a day.  She has no specific leg pain, occ episode of rt hip pain.  She complains of general aches, occ rt. Lateral chest pain that shoots around to her rt back.  Her last lipids were done by Dr Lelon Perla who follows them.  No chest pain, no SOB.  Allergies  Allergen Reactions  . Penicillins Other (See Comments)    unknown    Current Outpatient Prescriptions  Medication Sig Dispense Refill  . ADVOCATE REDI-CODE test strip For Glucose test.      . alendronate (FOSAMAX) 70 MG tablet Take 70 mg by mouth every 7 (seven) days. Take with a full glass of water on an empty stomach. Wednesdays      . aspirin EC 81 MG tablet Take 81 mg by mouth daily.      . Lite Touch Lancets MISC       . metFORMIN (GLUCOPHAGE-XR) 500 MG 24 hr tablet Take 500 mg by mouth 2 (two) times daily.      Marland Kitchen SPIRIVA HANDIHALER 18 MCG inhalation capsule Place 18 mcg into inhaler and inhale daily as needed (shortness of breath).       . valsartan (DIOVAN) 160 MG tablet Take 160 mg by mouth daily.        . phenazopyridine (PYRIDIUM) 200 MG tablet Take 1 tablet (200 mg total) by mouth 3 (three) times daily as needed for pain.  6 tablet  0   No current facility-administered medications for this visit.    Past Medical History  Diagnosis Date  . Breast cancer 1991  . Diabetes mellitus   . Non-stress test nonreactive, 04/24/12, normal 12/10/2012  . Tobacco use 12/10/2012    Past Surgical History  Procedure Laterality Date  . Mastectomy partial / lumpectomy w/ axillary lymphadenectomy  1991     Right  . Total hip arthroplasty  2011    R    ZOX:[id number]:UJ colds or fevers, no  weight changes Skin:no rashes or ulcers HEENT:no blurred vision, no congestion CV:see HPI PUL:see HPI GI:no diarrhea constipation or melena, no indigestion GU:no hematuria, no dysuria MS:no joint pain, no claudication Neuro:no syncope, no lightheadedness Endo:no diabetes, no thyroid disease  PHYSICAL EXAM BP 130/70  Pulse 65  Ht 5\' 4"  (1.626 m)  Wt 173 lb (78.472 kg)  BMI 29.68 kg/m2 General:Pleasant affect, NAD Skin:Warm and dry, brisk capillary refill HEENT:normocephalic, sclera clear, mucus membranes moist Neck:supple, no JVD, no bruits  Heart:S1S2 RRR without murmur, gallup, rub or click Lungs:clear without rales, rhonchi, or wheezes WJX:BJYNW, soft, non tender, + BS, do not palpate liver spleen or masses Ext:no lower ext edema,  Neuro:alert and oriented, MAE, follows commands, + facial symmetry  EKG:SR no acute changes.  ASSESSMENT AND PLAN PAD (peripheral artery disease), abnormal ABIs Minimal claudication.  No significant leg pain.   Dyslipidemia Last lipids 05/16/12, T. Chol 176, HDL 60, LDL 106, TG 49  Non-stress test nonreactive, 04/24/12, normal No further chest pain.  Tobacco use We discussed that she should cut back to best 8 cigarettes a day.  She will try.  Follow up with Dr. Berry in 6 months.  

## 2012-12-10 NOTE — Assessment & Plan Note (Signed)
No further chest pain

## 2012-12-10 NOTE — Assessment & Plan Note (Signed)
We discussed that she should cut back to best 8 cigarettes a day.  She will try.

## 2012-12-10 NOTE — Assessment & Plan Note (Signed)
Last lipids 05/16/12, T. Chol 176, HDL 60, LDL 106, TG 49

## 2012-12-10 NOTE — Assessment & Plan Note (Signed)
Minimal claudication.  No significant leg pain.

## 2012-12-10 NOTE — Patient Instructions (Signed)
Cut back to 8 cigarettes a day.  Try to stop if you can. Follow up with Dr. Allyson Sabal in 6 months.  Call if chest pain or increased leg pain.  Follow up with primary care as instructed.  We will obtain your cholesterol blood work from them.

## 2012-12-11 ENCOUNTER — Encounter: Payer: Self-pay | Admitting: Internal Medicine

## 2013-01-10 ENCOUNTER — Telehealth: Payer: Self-pay | Admitting: *Deleted

## 2013-01-10 ENCOUNTER — Encounter: Payer: Self-pay | Admitting: Cardiology

## 2013-01-10 NOTE — Telephone Encounter (Signed)
Message forwarded to L. Annie Paras, NP/JC Boaz, LPN who saw pt that day.

## 2013-01-10 NOTE — Telephone Encounter (Signed)
Pt's daughter called asking if she can have a note stating that she was here with her mother for her appointment. She forgot to get one when she was here.

## 2013-01-10 NOTE — Telephone Encounter (Signed)
Wrote note and gave to [facility].

## 2013-01-10 NOTE — Telephone Encounter (Signed)
Message forwarded to [facility]. Berlinda Last, LPN and note given to [facility].

## 2013-01-28 ENCOUNTER — Telehealth: Payer: Self-pay | Admitting: Cardiovascular Disease

## 2013-01-28 NOTE — Telephone Encounter (Signed)
Note was printed from the media tab and handed to the family member

## 2013-01-28 NOTE — Telephone Encounter (Signed)
Daughter is here to check on the status of a doctor's note. She stated that she called around November 8th, and no one did not return her call. Please advise daughter. She is here in the lobby. Thanks.

## 2013-04-15 ENCOUNTER — Ambulatory Visit (INDEPENDENT_AMBULATORY_CARE_PROVIDER_SITE_OTHER): Payer: Medicare Other | Admitting: Internal Medicine

## 2013-04-15 VITALS — BP 171/66 | HR 75 | Temp 98.3°F | Ht 63.0 in | Wt 177.0 lb

## 2013-04-15 DIAGNOSIS — M009 Pyogenic arthritis, unspecified: Secondary | ICD-10-CM

## 2013-04-16 NOTE — Progress Notes (Signed)
   Subjective:    Patient ID: Leah Wiggins, female    DOB: February 25, 1927, 80 y.o.   MRN: [id number]  HPI Here for a work in visit.  Initially complained of hip pain to her daughter but describes it now as bilateral thigh discomfort.  Has a history of left hip septic arthritis treated about 2 years ago and resolved.  No hip pain.  No fever, no chills.    Review of Systems  Constitutional: Negative for fever and chills.       Feels "warm"  Gastrointestinal: Negative for diarrhea.  Musculoskeletal: Negative for joint swelling.       Difficulty walking due to pain       Objective:   Physical Exam  Musculoskeletal:  No hip tenderness Slow gait           Assessment & Plan:

## 2013-04-16 NOTE — Assessment & Plan Note (Signed)
History of septic arthritis but no signs of infection now.  To discuss with orthopedics.  Return PRN.

## 2013-04-18 DIAGNOSIS — N182 Chronic kidney disease, stage 2 (mild): Secondary | ICD-10-CM | POA: Diagnosis not present

## 2013-04-18 DIAGNOSIS — E860 Dehydration: Secondary | ICD-10-CM | POA: Diagnosis not present

## 2013-04-18 DIAGNOSIS — R42 Dizziness and giddiness: Secondary | ICD-10-CM | POA: Diagnosis not present

## 2013-04-18 DIAGNOSIS — I129 Hypertensive chronic kidney disease with stage 1 through stage 4 chronic kidney disease, or unspecified chronic kidney disease: Secondary | ICD-10-CM | POA: Diagnosis not present

## 2013-04-22 DIAGNOSIS — Z96649 Presence of unspecified artificial hip joint: Secondary | ICD-10-CM | POA: Diagnosis not present

## 2013-04-22 DIAGNOSIS — M5137 Other intervertebral disc degeneration, lumbosacral region: Secondary | ICD-10-CM | POA: Diagnosis not present

## 2013-04-22 DIAGNOSIS — IMO0001 Reserved for inherently not codable concepts without codable children: Secondary | ICD-10-CM | POA: Diagnosis not present

## 2013-04-22 DIAGNOSIS — M19019 Primary osteoarthritis, unspecified shoulder: Secondary | ICD-10-CM | POA: Diagnosis not present

## 2013-05-06 DIAGNOSIS — M5137 Other intervertebral disc degeneration, lumbosacral region: Secondary | ICD-10-CM | POA: Diagnosis not present

## 2013-05-06 DIAGNOSIS — M25519 Pain in unspecified shoulder: Secondary | ICD-10-CM | POA: Diagnosis not present

## 2013-05-09 DIAGNOSIS — I129 Hypertensive chronic kidney disease with stage 1 through stage 4 chronic kidney disease, or unspecified chronic kidney disease: Secondary | ICD-10-CM | POA: Diagnosis not present

## 2013-05-09 DIAGNOSIS — Z23 Encounter for immunization: Secondary | ICD-10-CM | POA: Diagnosis not present

## 2013-05-09 DIAGNOSIS — N182 Chronic kidney disease, stage 2 (mild): Secondary | ICD-10-CM | POA: Diagnosis not present

## 2013-05-09 DIAGNOSIS — E669 Obesity, unspecified: Secondary | ICD-10-CM | POA: Diagnosis not present

## 2013-05-09 DIAGNOSIS — Z6833 Body mass index (BMI) 33.0-33.9, adult: Secondary | ICD-10-CM | POA: Diagnosis not present

## 2013-05-10 DIAGNOSIS — M5137 Other intervertebral disc degeneration, lumbosacral region: Secondary | ICD-10-CM | POA: Diagnosis not present

## 2013-05-10 DIAGNOSIS — M25519 Pain in unspecified shoulder: Secondary | ICD-10-CM | POA: Diagnosis not present

## 2013-05-13 DIAGNOSIS — M25519 Pain in unspecified shoulder: Secondary | ICD-10-CM | POA: Diagnosis not present

## 2013-05-14 ENCOUNTER — Encounter (HOSPITAL_COMMUNITY): Payer: Self-pay | Admitting: *Deleted

## 2013-05-14 ENCOUNTER — Other Ambulatory Visit (HOSPITAL_COMMUNITY): Payer: Self-pay | Admitting: Cardiovascular Disease

## 2013-05-14 DIAGNOSIS — I739 Peripheral vascular disease, unspecified: Secondary | ICD-10-CM

## 2013-05-17 DIAGNOSIS — M5137 Other intervertebral disc degeneration, lumbosacral region: Secondary | ICD-10-CM | POA: Diagnosis not present

## 2013-05-17 DIAGNOSIS — M25519 Pain in unspecified shoulder: Secondary | ICD-10-CM | POA: Diagnosis not present

## 2013-05-27 DIAGNOSIS — M5137 Other intervertebral disc degeneration, lumbosacral region: Secondary | ICD-10-CM | POA: Diagnosis not present

## 2013-05-27 DIAGNOSIS — M25519 Pain in unspecified shoulder: Secondary | ICD-10-CM | POA: Diagnosis not present

## 2013-05-31 DIAGNOSIS — M5137 Other intervertebral disc degeneration, lumbosacral region: Secondary | ICD-10-CM | POA: Diagnosis not present

## 2013-05-31 DIAGNOSIS — M25519 Pain in unspecified shoulder: Secondary | ICD-10-CM | POA: Diagnosis not present

## 2013-06-17 ENCOUNTER — Ambulatory Visit (HOSPITAL_COMMUNITY)
Admission: RE | Admit: 2013-06-17 | Discharge: 2013-06-17 | Disposition: A | Discharge status: Home / Self Care | Payer: Medicare Other | Source: Ambulatory Visit | Attending: Cardiology | Admitting: Cardiology

## 2013-06-17 DIAGNOSIS — I739 Peripheral vascular disease, unspecified: Secondary | ICD-10-CM | POA: Insufficient documentation

## 2013-06-17 DIAGNOSIS — I70219 Atherosclerosis of native arteries of extremities with intermittent claudication, unspecified extremity: Secondary | ICD-10-CM

## 2013-06-17 NOTE — Progress Notes (Signed)
Lower Ext. Arterial Duplex Completed. Hinda Lindor, BS, RDMS, RVT  

## 2013-06-24 ENCOUNTER — Ambulatory Visit (INDEPENDENT_AMBULATORY_CARE_PROVIDER_SITE_OTHER): Payer: Medicare Other | Admitting: Cardiovascular Disease

## 2013-06-24 ENCOUNTER — Encounter: Payer: Self-pay | Admitting: Cardiovascular Disease

## 2013-06-24 VITALS — BP 120/82 | HR 72 | Ht 63.0 in | Wt 174.2 lb

## 2013-06-24 DIAGNOSIS — E785 Hyperlipidemia, unspecified: Secondary | ICD-10-CM | POA: Diagnosis not present

## 2013-06-24 DIAGNOSIS — I10 Essential (primary) hypertension: Secondary | ICD-10-CM | POA: Diagnosis not present

## 2013-06-24 DIAGNOSIS — I739 Peripheral vascular disease, unspecified: Secondary | ICD-10-CM | POA: Diagnosis not present

## 2013-06-24 NOTE — Assessment & Plan Note (Signed)
Dopplers have shown moderate right iliac and left SFA disease with ABIs in the 0.9 range bilaterally however she really does not have significant claudication. We will continue to follow this.

## 2013-06-24 NOTE — Progress Notes (Signed)
06/24/2013 [name]   April 10, 1926  [id number]  Primary Physician Maximino Greenland, MD Primary Cardiologist: Lorretta Harp MD Renae Gloss   HPI:  The patient returns today for followup. She is an 80 year old mildly overweight widowed [place] American female, mother of 102, grandmother of greater than 44 grandchildren, who I saw 6 months ago. Her risk factors include continued tobacco use at 1-1/2 packs per day, despite counseling of greater than 3 minutes today to the contrary, hypertension, mild hyperlipidemia, and non-insulin-requiring diabetes. She also has peripheral vascular occlusive disease with Dopplers that show a right ABI of 1 with a high-frequency signal in the origin of the right common iliac artery, left ABI of 0.86 with a high-frequency signal in the origin of the left SFA, though she really denies claudication. When she was seen here last 2 months ago she was complaining of chest pain, and a Myoview stress test performed February 18 , 2014 was completely normal. She saw Cecilie Kicks nurse practitioner back 6 months ago and was doing well at that time. She has had no further symptoms.    Current Outpatient Prescriptions  Medication Sig Dispense Refill  . alendronate (FOSAMAX) 70 MG tablet Take 70 mg by mouth every 7 (seven) days. Take with a full glass of water on an empty stomach. Wednesdays      . aspirin EC 81 MG tablet Take 81 mg by mouth daily.      . metFORMIN (GLUCOPHAGE-XR) 500 MG 24 hr tablet Take 500 mg by mouth 2 (two) times daily.      Marland Kitchen SPIRIVA HANDIHALER 18 MCG inhalation capsule Place 18 mcg into inhaler and inhale daily as needed (shortness of breath).       . valsartan (DIOVAN) 160 MG tablet Take 160 mg by mouth daily.         No current facility-administered medications for this visit.    Allergies  Allergen Reactions  . Penicillins Other (See Comments)    unknown    History   Social History  . Marital Status: Widowed   Spouse Name: N/A    Number of Children: N/A  . Years of Education: N/A   Occupational History  . Not on file.   Social History Main Topics  . Smoking status: Current Every Day Smoker -- 0.50 packs/day for 60 years    Types: Cigarettes    Start date: 03/08/1947  . Smokeless tobacco: Not on file  . Alcohol Use: No  . Drug Use: No  . Sexual Activity: Yes   Other Topics Concern  . Not on file   Social History Narrative  . No narrative on file     Review of Systems: General: negative for chills, fever, night sweats or weight changes.  Cardiovascular: negative for chest pain, dyspnea on exertion, edema, orthopnea, palpitations, paroxysmal nocturnal dyspnea or shortness of breath Dermatological: negative for rash Respiratory: negative for cough or wheezing Urologic: negative for hematuria Abdominal: negative for nausea, vomiting, diarrhea, bright red blood per rectum, melena, or hematemesis Neurologic: negative for visual changes, syncope, or dizziness All other systems reviewed and are otherwise negative except as noted above.    Blood pressure 120/82, pulse 72, height 5\' 3"  (1.6 m), weight 174 lb 3.2 oz (79.017 kg).  General appearance: alert and no distress Neck: no adenopathy, no carotid bruit, no JVD, supple, symmetrical, trachea midline and thyroid not enlarged, symmetric, no tenderness/mass/nodules Lungs: clear to auscultation bilaterally Heart: regular rate and rhythm, S1, S2 normal,  no murmur, click, rub or gallop Extremities: extremities normal, atraumatic, no cyanosis or edema  EKG normal sinus rhythm at 72 without ST or T wave changes  ASSESSMENT AND PLAN:   Dyslipidemia Not on statin therapy, followed by her PCP  PAD (peripheral artery disease), abnormal ABIs Dopplers have shown moderate right iliac and left SFA disease with ABIs in the 0.9 range bilaterally however she really does not have significant claudication. We will continue to follow this.  Essential  hypertension Controlled on current medications      Lorretta Harp MD [facility] 06/24/2013 11:30 AM

## 2013-06-24 NOTE — Patient Instructions (Signed)
Your physician recommends that you schedule a follow-up appointment in: ONE YEAR with Dr.BERRY

## 2013-06-24 NOTE — Assessment & Plan Note (Signed)
Not on statin therapy, followed by her PCP

## 2013-06-24 NOTE — Assessment & Plan Note (Signed)
Controlled on current medications 

## 2013-06-25 ENCOUNTER — Other Ambulatory Visit: Payer: Self-pay | Admitting: *Deleted

## 2013-06-25 DIAGNOSIS — I739 Peripheral vascular disease, unspecified: Secondary | ICD-10-CM

## 2013-07-16 DIAGNOSIS — Z1211 Encounter for screening for malignant neoplasm of colon: Secondary | ICD-10-CM | POA: Diagnosis not present

## 2013-07-16 DIAGNOSIS — K625 Hemorrhage of anus and rectum: Secondary | ICD-10-CM | POA: Diagnosis not present

## 2013-07-16 DIAGNOSIS — K648 Other hemorrhoids: Secondary | ICD-10-CM | POA: Diagnosis not present

## 2013-07-16 DIAGNOSIS — N058 Unspecified nephritic syndrome with other morphologic changes: Secondary | ICD-10-CM | POA: Diagnosis not present

## 2013-07-16 DIAGNOSIS — E1165 Type 2 diabetes mellitus with hyperglycemia: Secondary | ICD-10-CM | POA: Diagnosis not present

## 2013-07-16 DIAGNOSIS — E1129 Type 2 diabetes mellitus with other diabetic kidney complication: Secondary | ICD-10-CM | POA: Diagnosis not present

## 2013-07-16 DIAGNOSIS — E559 Vitamin D deficiency, unspecified: Secondary | ICD-10-CM | POA: Diagnosis not present

## 2013-07-16 DIAGNOSIS — Z0000 Encounter for general adult medical examination without abnormal findings: Secondary | ICD-10-CM | POA: Diagnosis not present

## 2013-07-16 DIAGNOSIS — I129 Hypertensive chronic kidney disease with stage 1 through stage 4 chronic kidney disease, or unspecified chronic kidney disease: Secondary | ICD-10-CM | POA: Diagnosis not present

## 2013-07-16 DIAGNOSIS — N182 Chronic kidney disease, stage 2 (mild): Secondary | ICD-10-CM | POA: Diagnosis not present

## 2013-07-16 DIAGNOSIS — F17200 Nicotine dependence, unspecified, uncomplicated: Secondary | ICD-10-CM | POA: Diagnosis not present

## 2013-07-22 ENCOUNTER — Telehealth: Payer: Self-pay | Admitting: Internal Medicine

## 2013-07-22 ENCOUNTER — Ambulatory Visit (HOSPITAL_BASED_OUTPATIENT_CLINIC_OR_DEPARTMENT_OTHER): Payer: Medicare Other | Admitting: Internal Medicine

## 2013-07-22 ENCOUNTER — Other Ambulatory Visit (HOSPITAL_BASED_OUTPATIENT_CLINIC_OR_DEPARTMENT_OTHER): Payer: Medicare Other

## 2013-07-22 VITALS — BP 115/80 | HR 74 | Temp 98.9°F | Resp 20 | Ht 63.0 in | Wt 174.3 lb

## 2013-07-22 DIAGNOSIS — Z853 Personal history of malignant neoplasm of breast: Secondary | ICD-10-CM | POA: Diagnosis not present

## 2013-07-22 DIAGNOSIS — F17200 Nicotine dependence, unspecified, uncomplicated: Secondary | ICD-10-CM

## 2013-07-22 DIAGNOSIS — D696 Thrombocytopenia, unspecified: Secondary | ICD-10-CM | POA: Diagnosis not present

## 2013-07-22 LAB — CBC WITH DIFFERENTIAL/PLATELET
BASO%: 1.1 % (ref 0.0–2.0)
Basophils Absolute: 0.1 10*3/uL (ref 0.0–0.1)
EOS%: 2.3 % (ref 0.0–7.0)
Eosinophils Absolute: 0.2 10*3/uL (ref 0.0–0.5)
HCT: 37.9 % (ref 34.8–46.6)
HGB: 12.1 g/dL (ref 11.6–15.9)
LYMPH%: 29.7 % (ref 14.0–49.7)
MCH: 25 pg — ABNORMAL LOW (ref 25.1–34.0)
MCHC: 31.9 g/dL (ref 31.5–36.0)
MCV: 78.3 fL — ABNORMAL LOW (ref 79.5–101.0)
MONO#: 0.6 10*3/uL (ref 0.1–0.9)
MONO%: 8.3 % (ref 0.0–14.0)
NEUT#: 3.9 10*3/uL (ref 1.5–6.5)
NEUT%: 58.6 % (ref 38.4–76.8)
Platelets: 129 10*3/uL — ABNORMAL LOW (ref 145–400)
RBC: 4.85 10*6/uL (ref 3.70–5.45)
RDW: 17.1 % — ABNORMAL HIGH (ref 11.2–14.5)
WBC: 6.6 10*3/uL (ref 3.9–10.3)
lymph#: 2 10*3/uL (ref 0.9–3.3)

## 2013-07-22 LAB — MORPHOLOGY: PLT EST: DECREASED

## 2013-07-22 NOTE — Telephone Encounter (Signed)
gv and printed appt sched and avs for pt for May 2016

## 2013-07-22 NOTE — Progress Notes (Signed)
[facility] OFFICE PROGRESS NOTE  Maximino Greenland, MD [address] [place] [address]  DIAGNOSIS: Thrombocytopenia  Chief Complaint  Patient presents with  . Thrombocytopenia    CURRENT TREATMENT: Observation.  INTERVAL HISTORY: Leah Wiggins 80 y.o. female with a history of moderate thrombocytopenia is here for follow up.  She was last seen by Dr. Marko Plume on 07/25/2012. Her primary care is by Dr Baird Cancer, whom she sees every 3-6 months. She also has history of right breast cancer in 1991, post mastectomy with axillary node evaluation by Dr Kathrin Penner after which she apparently was on tamoxifen x 5 years. Initially, she  was referred to Dr Jamse Arn by Dr Baird Cancer with thrombocytopenia, platelet count in low 100s for at least several years and no bleeding problems. WBC and Hgb were not remarkable. She had normal size spleen by [place] 01-2012, SPEP normal and B12 normal.She did have a few giant platelets on smear then.  Her last mammogram was 11/2012.   Today, she is accompanied by her daughter Leah Wiggins.  She saw Dr. Baird Cancer last Tuesday without any changes in her medications.  She denies any recent emergency room visits or hospitalizations.  She denies any pains presently.     MEDICAL HISTORY: Past Medical History  Diagnosis Date  . Breast cancer 1991  . Diabetes mellitus   . Non-stress test nonreactive, 04/24/12, normal 12/10/2012  . Tobacco use 12/10/2012  . PVD (peripheral vascular disease)     INTERIM HISTORY: has METHICILLIN SUSECPTIBLE PNEUMONIA STAPH AUREUS; SEPTIC ARTHRITIS; HIP THI LEG&ANK ABRASION/FRICION BURN W/O INF; RESTLESS LEG SYNDROME; Thrombocytopenia; PAD (peripheral artery disease), abnormal ABIs; Non-stress test nonreactive, 04/24/12, normal; Dyslipidemia; Tobacco use; and Essential hypertension on her problem list.    ALLERGIES:  is allergic to penicillins.  MEDICATIONS: has a current medication list which includes the following  prescription(s): alendronate, aspirin ec, metformin, spiriva handihaler, and valsartan.  SURGICAL HISTORY:  Past Surgical History  Procedure Laterality Date  . Mastectomy partial / lumpectomy w/ axillary lymphadenectomy Right 1991   . Total hip arthroplasty Right 2011  . Lower extremity arterial doppler Bilateral 05/07/2012    Left ABI-demonstrated mild arterial insufficiency. Right CIA 50-69% diameter reduction. Right SFA less than 50% diameter reduction. Left SFA 70-99% diameter reduction. Bilateral Runoff-Posterior tibial arteries appeared occluded.  . Cardiovascular stress test  04/24/2012    No significant ST segment change suggestive of ischemia.    REVIEW OF SYSTEMS:   Constitutional: Denies fevers, chills or abnormal weight loss Eyes: Denies blurriness of vision Ears, nose, mouth, throat, and face: Denies mucositis or sore throat Respiratory: Denies cough, dyspnea or wheezes Cardiovascular: Denies palpitation, chest discomfort or lower extremity swelling Gastrointestinal:  Denies nausea, heartburn or change in bowel habits Skin: Denies abnormal skin rashes Lymphatics: Denies new lymphadenopathy or easy bruising Neurological:Denies numbness, tingling or new weaknesses Behavioral/Psych: Mood is stable, no new changes  All other systems were reviewed with the patient and are negative.  PHYSICAL EXAMINATION: ECOG PERFORMANCE STATUS: 0 - Asymptomatic  Blood pressure 115/80, pulse 74, temperature 98.9 F (37.2 C), temperature source Oral, resp. rate 20, height 5\' 3"  (1.6 m), weight 174 lb 4.8 oz (79.062 kg).  GENERAL:alert, no distress and comfortable; HOH; Easily ambulatory.  SKIN: skin color, texture, turgor are normal, no rashes or significant lesions EYES: normal, Conjunctiva are pink and non-injected, sclera clear OROPHARYNX:no exudate, no erythema and lips, buccal mucosa, and tongue normal  NECK: supple, thyroid normal size, non-tender, without nodularity LYMPH:  no palpable  lymphadenopathy in the cervical, axillary or supraclavicular BREASTS: left with no mass and no skin or nipple findings of concern, left axilla benign and no swelling LUE. Right mastectomy scar well healed without evidence of local recurrence. Right axilla not remarkable. 1-2+ swelling RUE without erythema or cords. LUNGS: clear to auscultation with normal breathing effort, no wheezes or rhonchi HEART: regular rate & rhythm and no murmurs and no lower extremity edema ABDOMEN:abdomen soft, non-tender and normal bowel sounds Musculoskeletal:no cyanosis of digits and no clubbing  NEURO: alert & oriented x 3 with fluent speech, no focal motor/sensory deficits  Labs:  Lab Results  Component Value Date   WBC 6.6 07/22/2013   HGB 12.1 07/22/2013   HCT 37.9 07/22/2013   MCV 78.3* 07/22/2013   PLT 129* 07/22/2013   NEUTROABS 3.9 07/22/2013      Chemistry      Component Value Date/Time   NA 143 07/25/2012 0849   NA 141 08/06/2011 0818   K 4.0 07/25/2012 0849   K 4.1 08/06/2011 0818   CL 110* 07/25/2012 0849   CL 102 08/06/2011 0818   CO2 24 07/25/2012 0849   CO2 27 08/06/2011 0818   BUN 14.2 07/25/2012 0849   BUN 16 08/06/2011 0818   CREATININE 1.0 07/25/2012 0849   CREATININE 0.74 08/06/2011 0818   CREATININE 0.89 07/07/2010 1107      Component Value Date/Time   CALCIUM 9.0 07/25/2012 0849   CALCIUM 9.2 08/06/2011 0818   ALKPHOS 86 01/13/2010 2022   AST 15 01/13/2010 2022   ALT 14 01/13/2010 2022   BILITOT 0.3 01/13/2010 2022     CBC:  Recent Labs Lab 07/22/13 0852  WBC 6.6  NEUTROABS 3.9  HGB 12.1  HCT 37.9  MCV 78.3*  PLT 129*    Anemia work up No results found for this basename: VITAMINB12, FOLATE, FERRITIN, TIBC, IRON, RETICCTPCT,  in the last 72 hours  Studies:  No results found.   RADIOGRAPHIC STUDIES: No results found.  ASSESSMENT: Leah Wiggins 80 y.o. female with a history of Thrombocytopenia   PLAN:   1. Mild thrombocytopenia: stable and no other findings of concern. We  will ask Dr Baird Cancer to repeat CBC in ~ 6 months and will see her back at this office in a year, or sooner if needed  2.remote right breast cancer: no known active disease now out 24 years from diagnosis. Up to date on mammograms at [facility] (last report on 11/2011).   3.Ongoing cigarette smoking: we have discussed cessation strategies, daughter very supportive. She smokes about 1/2 pack a day.  4.Diabetes, seems well controlled  5.left hip septic arthritis with MRSA ~ 2011, followed by infectious disease  All questions were answered. The patient knows to call the clinic with any problems, questions or concerns. We can certainly see the patient much sooner if necessary.  I spent 15 minutes counseling the patient face to face. The total time spent in the appointment was 25 minutes.    Concha Norway, MD 07/22/2013 9:28 AM

## 2013-08-01 DIAGNOSIS — M48061 Spinal stenosis, lumbar region without neurogenic claudication: Secondary | ICD-10-CM | POA: Diagnosis not present

## 2013-08-01 DIAGNOSIS — M5137 Other intervertebral disc degeneration, lumbosacral region: Secondary | ICD-10-CM | POA: Diagnosis not present

## 2013-08-08 DIAGNOSIS — Z79899 Other long term (current) drug therapy: Secondary | ICD-10-CM | POA: Diagnosis not present

## 2013-08-08 DIAGNOSIS — Z9119 Patient's noncompliance with other medical treatment and regimen: Secondary | ICD-10-CM | POA: Diagnosis not present

## 2013-08-08 DIAGNOSIS — Z91199 Patient's noncompliance with other medical treatment and regimen due to unspecified reason: Secondary | ICD-10-CM | POA: Diagnosis not present

## 2013-08-08 DIAGNOSIS — E7800 Pure hypercholesterolemia, unspecified: Secondary | ICD-10-CM | POA: Diagnosis not present

## 2013-08-08 DIAGNOSIS — F17200 Nicotine dependence, unspecified, uncomplicated: Secondary | ICD-10-CM | POA: Diagnosis not present

## 2013-08-12 DIAGNOSIS — H903 Sensorineural hearing loss, bilateral: Secondary | ICD-10-CM | POA: Diagnosis not present

## 2013-08-12 DIAGNOSIS — H6120 Impacted cerumen, unspecified ear: Secondary | ICD-10-CM | POA: Diagnosis not present

## 2013-08-12 DIAGNOSIS — H608X9 Other otitis externa, unspecified ear: Secondary | ICD-10-CM | POA: Diagnosis not present

## 2013-08-20 DIAGNOSIS — I10 Essential (primary) hypertension: Secondary | ICD-10-CM | POA: Diagnosis not present

## 2013-08-20 DIAGNOSIS — M79609 Pain in unspecified limb: Secondary | ICD-10-CM | POA: Diagnosis not present

## 2013-09-23 DIAGNOSIS — H903 Sensorineural hearing loss, bilateral: Secondary | ICD-10-CM | POA: Diagnosis not present

## 2013-09-23 DIAGNOSIS — H608X9 Other otitis externa, unspecified ear: Secondary | ICD-10-CM | POA: Diagnosis not present

## 2013-10-07 ENCOUNTER — Encounter (HOSPITAL_COMMUNITY): Payer: Self-pay | Admitting: Emergency Medicine

## 2013-10-07 DIAGNOSIS — Z853 Personal history of malignant neoplasm of breast: Secondary | ICD-10-CM | POA: Insufficient documentation

## 2013-10-07 DIAGNOSIS — K5900 Constipation, unspecified: Secondary | ICD-10-CM | POA: Insufficient documentation

## 2013-10-07 DIAGNOSIS — M25559 Pain in unspecified hip: Secondary | ICD-10-CM | POA: Insufficient documentation

## 2013-10-07 DIAGNOSIS — Z8679 Personal history of other diseases of the circulatory system: Secondary | ICD-10-CM | POA: Insufficient documentation

## 2013-10-07 DIAGNOSIS — Z7982 Long term (current) use of aspirin: Secondary | ICD-10-CM | POA: Diagnosis not present

## 2013-10-07 DIAGNOSIS — Z880 Allergy status to penicillin: Secondary | ICD-10-CM | POA: Insufficient documentation

## 2013-10-07 DIAGNOSIS — E119 Type 2 diabetes mellitus without complications: Secondary | ICD-10-CM | POA: Insufficient documentation

## 2013-10-07 DIAGNOSIS — Z96649 Presence of unspecified artificial hip joint: Secondary | ICD-10-CM | POA: Insufficient documentation

## 2013-10-07 DIAGNOSIS — R109 Unspecified abdominal pain: Secondary | ICD-10-CM | POA: Diagnosis not present

## 2013-10-07 DIAGNOSIS — F17200 Nicotine dependence, unspecified, uncomplicated: Secondary | ICD-10-CM | POA: Insufficient documentation

## 2013-10-07 DIAGNOSIS — N390 Urinary tract infection, site not specified: Secondary | ICD-10-CM | POA: Insufficient documentation

## 2013-10-07 DIAGNOSIS — Z79899 Other long term (current) drug therapy: Secondary | ICD-10-CM | POA: Insufficient documentation

## 2013-10-07 NOTE — ED Notes (Signed)
Pt states that she has been having pain in her left hip for a couple of days. Pt states that when she moves the pain gets worse and she is unable to ambulated due to pain. Pt states that she a blood clot in her groin in one of her legs but unsure which, pt believes it to the the left where pain is located.

## 2013-10-08 ENCOUNTER — Emergency Department (HOSPITAL_COMMUNITY)
Admission: EM | Admit: 2013-10-08 | Discharge: 2013-10-08 | Disposition: A | Discharge status: Home / Self Care | Payer: Medicare Other | Attending: Emergency Medicine | Admitting: Emergency Medicine

## 2013-10-08 ENCOUNTER — Emergency Department (HOSPITAL_COMMUNITY): Payer: Medicare Other

## 2013-10-08 DIAGNOSIS — R109 Unspecified abdominal pain: Secondary | ICD-10-CM | POA: Diagnosis not present

## 2013-10-08 DIAGNOSIS — N390 Urinary tract infection, site not specified: Secondary | ICD-10-CM

## 2013-10-08 LAB — URINALYSIS, ROUTINE W REFLEX MICROSCOPIC
Bilirubin Urine: NEGATIVE
Glucose, UA: NEGATIVE mg/dL
Ketones, ur: NEGATIVE mg/dL
Nitrite: NEGATIVE
Protein, ur: NEGATIVE mg/dL
Specific Gravity, Urine: 1.019 (ref 1.005–1.030)
Urobilinogen, UA: 0.2 mg/dL (ref 0.0–1.0)
pH: 5 (ref 5.0–8.0)

## 2013-10-08 LAB — COMPREHENSIVE METABOLIC PANEL
ALT: 16 U/L (ref 0–35)
AST: 20 U/L (ref 0–37)
Albumin: 3.6 g/dL (ref 3.5–5.2)
Alkaline Phosphatase: 75 U/L (ref 39–117)
Anion gap: 11 (ref 5–15)
BUN: 21 mg/dL (ref 6–23)
CO2: 25 mEq/L (ref 19–32)
Calcium: 9.6 mg/dL (ref 8.4–10.5)
Chloride: 112 mEq/L (ref 96–112)
Creatinine, Ser: 0.81 mg/dL (ref 0.50–1.10)
GFR calc Af Amer: 75 mL/min — ABNORMAL LOW (ref >90)
GFR calc non Af Amer: 65 mL/min — ABNORMAL LOW (ref >90)
Glucose, Bld: 119 mg/dL — ABNORMAL HIGH (ref 70–99)
Potassium: 4.5 mEq/L (ref 3.7–5.3)
Sodium: 148 mEq/L — ABNORMAL HIGH (ref 137–147)
Total Bilirubin: <0.2 mg/dL — ABNORMAL LOW (ref 0.3–1.2)
Total Protein: 7.3 g/dL (ref 6.0–8.3)

## 2013-10-08 LAB — CBC WITH DIFFERENTIAL/PLATELET
Basophils Absolute: 0 10*3/uL (ref 0.0–0.1)
Basophils Relative: 0 % (ref 0–1)
Eosinophils Absolute: 0.1 10*3/uL (ref 0.0–0.7)
Eosinophils Relative: 2 % (ref 0–5)
HCT: 37.5 % (ref 36.0–46.0)
Hemoglobin: 12.4 g/dL (ref 12.0–15.0)
Lymphocytes Relative: 29 % (ref 12–46)
Lymphs Abs: 2.3 10*3/uL (ref 0.7–4.0)
MCH: 25.6 pg — ABNORMAL LOW (ref 26.0–34.0)
MCHC: 33.1 g/dL (ref 30.0–36.0)
MCV: 77.3 fL — ABNORMAL LOW (ref 78.0–100.0)
Monocytes Absolute: 0.7 10*3/uL (ref 0.1–1.0)
Monocytes Relative: 8 % (ref 3–12)
Neutro Abs: 5 10*3/uL (ref 1.7–7.7)
Neutrophils Relative %: 62 % (ref 43–77)
Platelets: 141 10*3/uL — ABNORMAL LOW (ref 150–400)
RBC: 4.85 MIL/uL (ref 3.87–5.11)
RDW: 17 % — ABNORMAL HIGH (ref 11.5–15.5)
WBC: 8.1 10*3/uL (ref 4.0–10.5)

## 2013-10-08 LAB — URINE MICROSCOPIC-ADD ON

## 2013-10-08 MED ORDER — CIPROFLOXACIN HCL 250 MG PO TABS: 250.0000 mg | ORAL_TABLET | Freq: Two times a day (BID) | ORAL | Status: DC

## 2013-10-08 NOTE — ED Notes (Signed)
Dr. Lita Mains in to see pt/family, updated.

## 2013-10-08 NOTE — Discharge Instructions (Signed)

## 2013-10-08 NOTE — ED Notes (Signed)
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

## 2013-10-08 NOTE — ED Notes (Signed)
Pt and family sleeping, arousable to entering room, NAD, calm, interactive, denies pain, states, "feel good".

## 2013-10-08 NOTE — ED Notes (Signed)
Pt not in room.

## 2013-10-08 NOTE — ED Notes (Signed)
Pt alert, NAD, calm, interactive, resps e/u, speaking clearly, no dyspnea noted.  c/o L hip pain, constant, but worse with movement (denies: pain, sob or dizziness), admits to mild nausea. 06/24/13 vasc.cardiology note reviewed. Family at [facility]. BLE equally warm. Pedal pulses faint. Doppler used to find DPs & PTs pulses. Present weak. Equal bilaterally. Dr. Lita Mains in to see pt.

## 2013-10-08 NOTE — ED Notes (Signed)
Pt ambulatory with cane up to b/r. Declines w/c. Slow steady cautious gate with cane. Denies hip pain.

## 2013-10-08 NOTE — ED Provider Notes (Signed)
CSN: [id number]     Arrival date & time 10/07/13  2154 History   First MD Initiated Contact with Patient 10/08/13 0141     Chief Complaint  Patient presents with  . Hip Pain     (Consider location/radiation/quality/duration/timing/severity/associated sxs/prior Treatment) HPI Patient presents with lower abdominal pain for the past few days. She also has mild pain in the left pelvic brim. She denies any dysuria. She denies any nausea or vomiting. Denies any numbness or tingling. No weakness. No fever chills. She has a history of constipation though his head recent bowel movements. Past Medical History  Diagnosis Date  . Breast cancer 1991  . Diabetes mellitus   . Non-stress test nonreactive, 04/24/12, normal 12/10/2012  . Tobacco use 12/10/2012  . PVD (peripheral vascular disease)    Past Surgical History  Procedure Laterality Date  . Mastectomy partial / lumpectomy w/ axillary lymphadenectomy Right 1991   . Total hip arthroplasty Right 2011  . Lower extremity arterial doppler Bilateral 05/07/2012    Left ABI-demonstrated mild arterial insufficiency. Right CIA 50-69% diameter reduction. Right SFA less than 50% diameter reduction. Left SFA 70-99% diameter reduction. Bilateral Runoff-Posterior tibial arteries appeared occluded.  . Cardiovascular stress test  04/24/2012    No significant ST segment change suggestive of ischemia.   Family History  Problem Relation Age of Onset  . Heart disease Mother   . Stroke Mother   . Stroke Father   . Cancer Sister   . Cancer Brother    History  Substance Use Topics  . Smoking status: Current Every Day Smoker -- 0.50 packs/day for 60 years    Types: Cigarettes    Start date: [birth year]/08/1947  . Smokeless tobacco: Not on file  . Alcohol Use: No   OB History   Grav Para Term Preterm Abortions TAB SAB Ect Mult Living                 Review of Systems  Constitutional: Negative for fever and chills.  Respiratory: Negative for cough and shortness of  breath.   Cardiovascular: Negative for chest pain, palpitations and leg swelling.  Gastrointestinal: Positive for abdominal pain and constipation. Negative for nausea, vomiting, diarrhea and blood in stool.  Genitourinary: Positive for frequency. Negative for dysuria, flank pain and difficulty urinating.  Musculoskeletal: Negative for back pain, myalgias, neck pain and neck stiffness.  Skin: Negative for rash and wound.  Neurological: Negative for dizziness, weakness, light-headedness, numbness and headaches.  All other systems reviewed and are negative.     Allergies  Penicillins  Home Medications   Prior to Admission medications   Medication Sig Start Date End Date Taking? Authorizing Provider  alendronate (FOSAMAX) 70 MG tablet Take 70 mg by mouth every 7 (seven) days. Take with a full glass of water on an empty stomach. Wednesdays   Yes Historical Provider, MD  aspirin EC 81 MG tablet Take 81 mg by mouth daily.   Yes Historical Provider, MD  metFORMIN (GLUCOPHAGE-XR) 500 MG 24 hr tablet Take 500 mg by mouth 2 (two) times daily.   Yes Historical Provider, MD  SPIRIVA HANDIHALER 18 MCG inhalation capsule Place 18 mcg into inhaler and inhale daily as needed (shortness of breath).  11/05/11  Yes Historical Provider, MD  valsartan (DIOVAN) 160 MG tablet Take 160 mg by mouth daily.     Yes Historical Provider, MD  ciprofloxacin (CIPRO) 250 MG tablet Take 1 tablet (250 mg total) by mouth every 12 (twelve) hours. 10/08/13  Julianne Rice, MD   BP 143/63  Pulse 59  Temp(Src) 98.9 F (37.2 C) (Oral)  Resp 16  Ht 5\' 3"  (1.6 m)  Wt 176 lb (79.833 kg)  BMI 31.18 kg/m2  SpO2 99% Physical Exam  Nursing note and vitals reviewed. Constitutional: She is oriented to person, place, and time. She appears well-developed and well-nourished. No distress.  HENT:  Head: Normocephalic and atraumatic.  Mouth/Throat: Oropharynx is clear and moist.  Eyes: EOM are normal. Pupils are equal, round, and  reactive to light.  Neck: Normal range of motion. Neck supple.  Cardiovascular: Normal rate and regular rhythm.   Pulmonary/Chest: Effort normal and breath sounds normal. No respiratory distress. She has no wheezes. She has no rales.  Abdominal: Soft. Bowel sounds are normal. She exhibits no distension and no mass. There is tenderness (mild tenderness to palpation over the left iliac crest and suprapubic region. There is no rebound or guarding.). There is no rebound and no guarding.  Musculoskeletal: Normal range of motion. She exhibits no edema and no tenderness.  Full range of motion of the left hip without any pain. Distal pulses intact. No calf swelling or tenderness. No flank pain bilaterally.  Neurological: She is alert and oriented to person, place, and time.  Patient is alert and oriented x3 with clear, goal oriented speech. Patient has 5/5 motor in all extremities. Sensation is intact to light touch. Bilateral finger-to-nose is normal with no signs of dysmetria.   Skin: Skin is warm and dry. No rash noted. No erythema.  Psychiatric: She has a normal mood and affect. Her behavior is normal.    ED Course  Procedures (including critical care time) Labs Review Labs Reviewed  CBC WITH DIFFERENTIAL - Abnormal; Notable for the following:    MCV 77.3 (*)    MCH 25.6 (*)    RDW 17.0 (*)    Platelets 141 (*)    All other components within normal limits  COMPREHENSIVE METABOLIC PANEL - Abnormal; Notable for the following:    Sodium 148 (*)    Glucose, Bld 119 (*)    Total Bilirubin <0.2 (*)    GFR calc non Af Amer 65 (*)    GFR calc Af Amer 75 (*)    All other components within normal limits  URINALYSIS, ROUTINE W REFLEX MICROSCOPIC - Abnormal; Notable for the following:    Hgb urine dipstick TRACE (*)    Leukocytes, UA SMALL (*)    All other components within normal limits  URINE MICROSCOPIC-ADD ON - Abnormal; Notable for the following:    Squamous Epithelial / LPF FEW (*)     Bacteria, UA FEW (*)    Casts HYALINE CASTS (*)    All other components within normal limits    Imaging Review Dg Abd Acute W/chest  10/08/2013   CLINICAL DATA:  Left-sided abdominal and hip pain.  EXAM: ACUTE ABDOMEN SERIES (ABDOMEN 2 VIEW & CHEST 1 VIEW)  COMPARISON:  08/06/2011 chest x-ray  FINDINGS: Mild pulmonary hyperinflation. There is no edema, consolidation, effusion, or pneumothorax. Normal heart size. Calcified lymph nodes in the left mediastinum. Status post right axillary dissection. Advanced left glenohumeral osteoarthritis with probable loose bodies.  No evidence of bowel obstruction or perforation. No concerning intra-abdominal mass effect or calcification. Diffuse spondylotic endplate spurring. Total right hip arthroplasty.  IMPRESSION: No evidence of bowel obstruction or acute cardiopulmonary disease.   Electronically Signed   By: Jorje Guild M.D.   On: 10/08/2013 03:09  EKG Interpretation None      MDM   Final diagnoses:  UTI (lower urinary tract infection)    Benign exam. Patient has many bacteria in her urine and some white blood cells. We'll treat for urinary tract infection. Vital signs remained stable. Patient is encouraged to followup with her primary physician. Return precautions have been given.    Julianne Rice, MD 10/08/13 [phone]

## 2013-10-08 NOTE — ED Notes (Signed)
Pt back to stretcher, steady gait with cane, resting in stretcher, family at [facility]

## 2013-11-25 DIAGNOSIS — N182 Chronic kidney disease, stage 2 (mild): Secondary | ICD-10-CM | POA: Diagnosis not present

## 2013-11-25 DIAGNOSIS — E1165 Type 2 diabetes mellitus with hyperglycemia: Secondary | ICD-10-CM | POA: Diagnosis not present

## 2013-11-25 DIAGNOSIS — E1129 Type 2 diabetes mellitus with other diabetic kidney complication: Secondary | ICD-10-CM | POA: Diagnosis not present

## 2013-11-25 DIAGNOSIS — I129 Hypertensive chronic kidney disease with stage 1 through stage 4 chronic kidney disease, or unspecified chronic kidney disease: Secondary | ICD-10-CM | POA: Diagnosis not present

## 2013-11-25 DIAGNOSIS — N058 Unspecified nephritic syndrome with other morphologic changes: Secondary | ICD-10-CM | POA: Diagnosis not present

## 2013-12-02 DIAGNOSIS — N644 Mastodynia: Secondary | ICD-10-CM | POA: Diagnosis not present

## 2013-12-19 ENCOUNTER — Emergency Department (INDEPENDENT_AMBULATORY_CARE_PROVIDER_SITE_OTHER)
Admission: EM | Admit: 2013-12-19 | Discharge: 2013-12-19 | Disposition: A | Discharge status: Home / Self Care | Payer: Medicare Other | Source: Home / Self Care | Attending: Emergency Medicine | Admitting: Emergency Medicine

## 2013-12-19 ENCOUNTER — Encounter (HOSPITAL_COMMUNITY): Payer: Self-pay | Admitting: Emergency Medicine

## 2013-12-19 DIAGNOSIS — M25551 Pain in right hip: Secondary | ICD-10-CM

## 2013-12-19 HISTORY — DX: Essential (primary) hypertension: I10

## 2013-12-19 MED ORDER — HYDROCODONE-ACETAMINOPHEN 5-325 MG PO TABS: 1.0000 | ORAL_TABLET | ORAL | Status: DC | PRN

## 2013-12-19 MED ORDER — HYDROCODONE-ACETAMINOPHEN 5-325 MG PO TABS
1.0000 | ORAL_TABLET | Freq: Once | ORAL | Status: AC
  Administered 2013-12-19: 1 via ORAL

## 2013-12-19 MED ORDER — HYDROCODONE-ACETAMINOPHEN 5-325 MG PO TABS
ORAL_TABLET | ORAL | Status: AC
  Filled 2013-12-19: qty 1

## 2013-12-19 NOTE — Discharge Instructions (Signed)
Hip Pain Your hip is the joint between your upper legs and your lower pelvis. The bones, cartilage, tendons, and muscles of your hip joint perform a lot of work each day supporting your body weight and allowing you to move around. Hip pain can range from a minor ache to severe pain in one or both of your hips. Pain may be felt on the inside of the hip joint near the groin, or the outside near the buttocks and upper thigh. You may have swelling or stiffness as well.  HOME CARE INSTRUCTIONS   Take medicines only as directed by your health care provider.  Apply ice to the injured area:  Put ice in a plastic bag.  Place a towel between your skin and the bag.  Leave the ice on for 15-20 minutes at a time, 3-4 times a day.  Keep your leg raised (elevated) when possible to lessen swelling.  Avoid activities that cause pain.  Follow specific exercises as directed by your health care provider.  Sleep with a pillow between your legs on your most comfortable side.  Record how often you have hip pain, the location of the pain, and what it feels like. SEEK MEDICAL CARE IF:   You are unable to put weight on your leg.  Your hip is red or swollen or very tender to touch.  Your pain or swelling continues or worsens after 1 week.  You have increasing difficulty walking.  You have a fever. SEEK IMMEDIATE MEDICAL CARE IF:   You have fallen.  You have a sudden increase in pain and swelling in your hip. MAKE SURE YOU:   Understand these instructions.  Will watch your condition.  Will get help right away if you are not doing well or get worse. Document Released: 08/11/2009 Document Revised: 07/08/2013 Document Reviewed: 10/18/2012 ExitCare Patient Information 2015 [employer]. This information is not intended to replace advice given to you by your health care provider. Make sure you discuss any questions you have with your health care provider.  

## 2013-12-19 NOTE — ED Notes (Signed)
C/o R hip pain onset tues. and prog. worse.  No fall or injury.  Hurts to sit, and lie down.  Walks on it but had difficulty getting in and out of car.

## 2013-12-19 NOTE — ED Provider Notes (Signed)
CSN: [id number]     Arrival date & time 12/19/13  1825 History   First MD Initiated Contact with Patient 12/19/13 1905     Chief Complaint  Patient presents with  . Hip Pain   (Consider location/radiation/quality/duration/timing/severity/associated sxs/prior Treatment) HPI    80 year old female presents complaining of right hip pain. For the past she days she has had progressively worsening pain in the right hip. The pain is in the lateral right hip and radiates into the right groin. She denies any injury, but she does have a history of a hip replacement in that hip. She also has a history of septic arthritis in the contralateral hip following TKA on the left knee. The pain is constant and seems to be worse with sitting. She is able to walk and bear weight without much difficulty. She is not at all similar to when she had septic arthritis. No fever, chills, NVD. No numbness or weakness. No injury  Past Medical History  Diagnosis Date  . Breast cancer 1991  . Diabetes mellitus   . Non-stress test nonreactive, 04/24/12, normal 12/10/2012  . Tobacco use 12/10/2012  . PVD (peripheral vascular disease)   . Hypertension    Past Surgical History  Procedure Laterality Date  . Mastectomy partial / lumpectomy w/ axillary lymphadenectomy Right 1991   . Total hip arthroplasty Right 2011  . Lower extremity arterial doppler Bilateral 05/07/2012    Left ABI-demonstrated mild arterial insufficiency. Right CIA 50-69% diameter reduction. Right SFA less than 50% diameter reduction. Left SFA 70-99% diameter reduction. Bilateral Runoff-Posterior tibial arteries appeared occluded.  . Cardiovascular stress test  04/24/2012    No significant ST segment change suggestive of ischemia.   Family History  Problem Relation Age of Onset  . Heart disease Mother   . Stroke Mother   . Stroke Father   . Cancer Sister   . Cancer Brother    History  Substance Use Topics  . Smoking status: Current Every Day Smoker --  0.25 packs/day for 60 years    Types: Cigarettes    Start date: [birth year]/1947  . Smokeless tobacco: Not on file  . Alcohol Use: No   OB History   Grav Para Term Preterm Abortions TAB SAB Ect Mult Living                 Review of Systems  Musculoskeletal: Positive for arthralgias (see history of present illness).  All other systems reviewed and are negative.   Allergies  Penicillins  Home Medications   Prior to Admission medications   Medication Sig Start Date End Date Taking? Authorizing Provider  aspirin EC 81 MG tablet Take 81 mg by mouth daily.   Yes Historical Provider, MD  Cholecalciferol (VITAMIN D3) 50000 UNITS CAPS Take 1 capsule by mouth once a week.   Yes Historical Provider, MD  metFORMIN (GLUCOPHAGE-XR) 500 MG 24 hr tablet Take 500 mg by mouth 2 (two) times daily.   Yes Historical Provider, MD  rosuvastatin (CRESTOR) 40 MG tablet Take 40 mg by mouth daily.   Yes Historical Provider, MD  SPIRIVA HANDIHALER 18 MCG inhalation capsule Place 18 mcg into inhaler and inhale daily as needed (shortness of breath).  11/05/11  Yes Historical Provider, MD  valsartan (DIOVAN) 160 MG tablet Take 160 mg by mouth daily.     Yes Historical Provider, MD  alendronate (FOSAMAX) 70 MG tablet Take 70 mg by mouth every 7 (seven) days. Take with a full glass of water on an  empty stomach. Wednesdays    Historical Provider, MD  ciprofloxacin (CIPRO) 250 MG tablet Take 1 tablet (250 mg total) by mouth every 12 (twelve) hours. 10/08/13   Julianne Rice, MD  HYDROcodone-acetaminophen (NORCO) 5-325 MG per tablet Take 1 tablet by mouth every 4 (four) hours as needed for moderate pain. 12/19/13   Freeman Caldron Floyed Masoud, PA-C   BP 147/71  Pulse 72  Temp(Src) 99 F (37.2 C) (Oral)  Resp 20  SpO2 98% Physical Exam  Nursing note and vitals reviewed. Constitutional: She is oriented to person, place, and time. Vital signs are normal. She appears well-developed and well-nourished. No distress.  HENT:  Head:  Normocephalic and atraumatic.  Cardiovascular:  Pulses:      Dorsalis pedis pulses are 2+ on the right side, and 2+ on the left side.  Pulmonary/Chest: Effort normal. No respiratory distress.  Musculoskeletal:       Right hip: Normal. She exhibits normal range of motion, normal strength, no tenderness, no bony tenderness, no swelling, no crepitus and no deformity.       Left hip: Normal.       Lumbar back: Normal.  Neurological: She is alert and oriented to person, place, and time. She has normal strength and normal reflexes. No sensory deficit. She exhibits normal muscle tone. Gait (antalgic, favor rt ) abnormal. Coordination normal.  Skin: Skin is warm and dry. No rash noted. She is not diaphoretic.  Psychiatric: She has a normal mood and affect. Judgment normal.    ED Course  Procedures (including critical care time) Labs Review Labs Reviewed - No data to display  Imaging Review No results found.   MDM   1. Hip pain, right    Good range of motion, no red flags. Treat with hydrocodone and she will followup with her orthopedic surgeon ASAP.  Discharge Medication List as of 12/19/2013  7:31 PM    START taking these medications   Details  HYDROcodone-acetaminophen (NORCO) 5-325 MG per tablet Take 1 tablet by mouth every 4 (four) hours as needed for moderate pain., Starting 12/19/2013, Until Discontinued, Print           Liam Graham, PA-C 12/20/13 [phone]

## 2013-12-22 NOTE — ED Provider Notes (Signed)
Medical screening examination/treatment/procedure(s) were performed by resident physician or non-physician practitioner and as supervising physician I was immediately available for consultation/collaboration.  Maryruth Eve, MD     Melony Overly, MD 12/22/13 [phone]

## 2013-12-23 DIAGNOSIS — N08 Glomerular disorders in diseases classified elsewhere: Secondary | ICD-10-CM | POA: Diagnosis not present

## 2014-01-08 DIAGNOSIS — Z96641 Presence of right artificial hip joint: Secondary | ICD-10-CM | POA: Diagnosis not present

## 2014-01-08 DIAGNOSIS — M25551 Pain in right hip: Secondary | ICD-10-CM | POA: Diagnosis not present

## 2014-01-08 DIAGNOSIS — Z471 Aftercare following joint replacement surgery: Secondary | ICD-10-CM | POA: Diagnosis not present

## 2014-01-08 DIAGNOSIS — M7062 Trochanteric bursitis, left hip: Secondary | ICD-10-CM | POA: Diagnosis not present

## 2014-02-10 ENCOUNTER — Encounter: Payer: Self-pay | Admitting: Cardiovascular Disease

## 2014-02-17 DIAGNOSIS — Z13820 Encounter for screening for osteoporosis: Secondary | ICD-10-CM | POA: Diagnosis not present

## 2014-02-17 DIAGNOSIS — I129 Hypertensive chronic kidney disease with stage 1 through stage 4 chronic kidney disease, or unspecified chronic kidney disease: Secondary | ICD-10-CM | POA: Diagnosis not present

## 2014-02-17 DIAGNOSIS — M85859 Other specified disorders of bone density and structure, unspecified thigh: Secondary | ICD-10-CM | POA: Diagnosis not present

## 2014-02-17 DIAGNOSIS — E1121 Type 2 diabetes mellitus with diabetic nephropathy: Secondary | ICD-10-CM | POA: Diagnosis not present

## 2014-02-17 DIAGNOSIS — N182 Chronic kidney disease, stage 2 (mild): Secondary | ICD-10-CM | POA: Diagnosis not present

## 2014-02-17 DIAGNOSIS — M25511 Pain in right shoulder: Secondary | ICD-10-CM | POA: Diagnosis not present

## 2014-03-03 ENCOUNTER — Emergency Department (INDEPENDENT_AMBULATORY_CARE_PROVIDER_SITE_OTHER)
Admission: EM | Admit: 2014-03-03 | Discharge: 2014-03-03 | Disposition: A | Discharge status: Home / Self Care | Payer: Medicare Other | Source: Home / Self Care | Attending: Family Medicine | Admitting: Family Medicine

## 2014-03-03 ENCOUNTER — Inpatient Hospital Stay (HOSPITAL_COMMUNITY)
Admission: EM | Admit: 2014-03-03 | Discharge: 2014-03-04 | DRG: 313 | Disposition: A | Discharge status: Home / Self Care | Payer: Medicare Other | Attending: Internal Medicine | Admitting: Internal Medicine

## 2014-03-03 ENCOUNTER — Encounter (HOSPITAL_COMMUNITY): Payer: Self-pay | Admitting: Emergency Medicine

## 2014-03-03 ENCOUNTER — Encounter (HOSPITAL_COMMUNITY): Payer: Self-pay | Admitting: *Deleted

## 2014-03-03 ENCOUNTER — Emergency Department (HOSPITAL_COMMUNITY): Payer: Medicare Other

## 2014-03-03 DIAGNOSIS — R0602 Shortness of breath: Secondary | ICD-10-CM | POA: Diagnosis not present

## 2014-03-03 DIAGNOSIS — J984 Other disorders of lung: Secondary | ICD-10-CM | POA: Diagnosis not present

## 2014-03-03 DIAGNOSIS — D696 Thrombocytopenia, unspecified: Secondary | ICD-10-CM | POA: Diagnosis not present

## 2014-03-03 DIAGNOSIS — K7689 Other specified diseases of liver: Secondary | ICD-10-CM | POA: Diagnosis not present

## 2014-03-03 DIAGNOSIS — Z96649 Presence of unspecified artificial hip joint: Secondary | ICD-10-CM | POA: Diagnosis present

## 2014-03-03 DIAGNOSIS — R1011 Right upper quadrant pain: Secondary | ICD-10-CM | POA: Diagnosis present

## 2014-03-03 DIAGNOSIS — I739 Peripheral vascular disease, unspecified: Secondary | ICD-10-CM | POA: Diagnosis present

## 2014-03-03 DIAGNOSIS — M12812 Other specific arthropathies, not elsewhere classified, left shoulder: Secondary | ICD-10-CM | POA: Diagnosis present

## 2014-03-03 DIAGNOSIS — Z880 Allergy status to penicillin: Secondary | ICD-10-CM | POA: Diagnosis not present

## 2014-03-03 DIAGNOSIS — Z720 Tobacco use: Secondary | ICD-10-CM | POA: Diagnosis present

## 2014-03-03 DIAGNOSIS — F17210 Nicotine dependence, cigarettes, uncomplicated: Secondary | ICD-10-CM | POA: Diagnosis present

## 2014-03-03 DIAGNOSIS — R109 Unspecified abdominal pain: Secondary | ICD-10-CM

## 2014-03-03 DIAGNOSIS — E119 Type 2 diabetes mellitus without complications: Secondary | ICD-10-CM

## 2014-03-03 DIAGNOSIS — Z7982 Long term (current) use of aspirin: Secondary | ICD-10-CM

## 2014-03-03 DIAGNOSIS — R0789 Other chest pain: Principal | ICD-10-CM | POA: Diagnosis present

## 2014-03-03 DIAGNOSIS — Z853 Personal history of malignant neoplasm of breast: Secondary | ICD-10-CM

## 2014-03-03 DIAGNOSIS — I10 Essential (primary) hypertension: Secondary | ICD-10-CM | POA: Diagnosis not present

## 2014-03-03 DIAGNOSIS — R079 Chest pain, unspecified: Secondary | ICD-10-CM | POA: Diagnosis present

## 2014-03-03 DIAGNOSIS — R110 Nausea: Secondary | ICD-10-CM | POA: Diagnosis not present

## 2014-03-03 DIAGNOSIS — K760 Fatty (change of) liver, not elsewhere classified: Secondary | ICD-10-CM | POA: Diagnosis present

## 2014-03-03 DIAGNOSIS — E785 Hyperlipidemia, unspecified: Secondary | ICD-10-CM | POA: Diagnosis not present

## 2014-03-03 DIAGNOSIS — I369 Nonrheumatic tricuspid valve disorder, unspecified: Secondary | ICD-10-CM | POA: Diagnosis not present

## 2014-03-03 DIAGNOSIS — O288 Other abnormal findings on antenatal screening of mother: Secondary | ICD-10-CM | POA: Diagnosis present

## 2014-03-03 LAB — CBC WITH DIFFERENTIAL/PLATELET
Basophils Absolute: 0 10*3/uL (ref 0.0–0.1)
Basophils Relative: 1 % (ref 0–1)
Eosinophils Absolute: 0.1 10*3/uL (ref 0.0–0.7)
Eosinophils Relative: 1 % (ref 0–5)
HCT: 36 % (ref 36.0–46.0)
Hemoglobin: 12 g/dL (ref 12.0–15.0)
Lymphocytes Relative: 28 % (ref 12–46)
Lymphs Abs: 1.9 10*3/uL (ref 0.7–4.0)
MCH: 25.5 pg — ABNORMAL LOW (ref 26.0–34.0)
MCHC: 33.3 g/dL (ref 30.0–36.0)
MCV: 76.6 fL — ABNORMAL LOW (ref 78.0–100.0)
Monocytes Absolute: 0.5 10*3/uL (ref 0.1–1.0)
Monocytes Relative: 7 % (ref 3–12)
Neutro Abs: 4.2 10*3/uL (ref 1.7–7.7)
Neutrophils Relative %: 63 % (ref 43–77)
Platelets: 103 10*3/uL — ABNORMAL LOW (ref 150–400)
RBC: 4.7 MIL/uL (ref 3.87–5.11)
RDW: 16.9 % — ABNORMAL HIGH (ref 11.5–15.5)
WBC: 6.6 10*3/uL (ref 4.0–10.5)

## 2014-03-03 LAB — URINALYSIS, ROUTINE W REFLEX MICROSCOPIC
Bilirubin Urine: NEGATIVE
Glucose, UA: NEGATIVE mg/dL
Ketones, ur: NEGATIVE mg/dL
Nitrite: NEGATIVE
Protein, ur: NEGATIVE mg/dL
Specific Gravity, Urine: 1.014 (ref 1.005–1.030)
Urobilinogen, UA: 0.2 mg/dL (ref 0.0–1.0)
pH: 5 (ref 5.0–8.0)

## 2014-03-03 LAB — URINE MICROSCOPIC-ADD ON

## 2014-03-03 LAB — COMPREHENSIVE METABOLIC PANEL
ALT: 14 U/L (ref 0–35)
AST: 19 U/L (ref 0–37)
Albumin: 3.3 g/dL — ABNORMAL LOW (ref 3.5–5.2)
Alkaline Phosphatase: 60 U/L (ref 39–117)
Anion gap: 7 (ref 5–15)
BUN: 15 mg/dL (ref 6–23)
CO2: 23 mmol/L (ref 19–32)
Calcium: 8.9 mg/dL (ref 8.4–10.5)
Chloride: 112 mEq/L (ref 96–112)
Creatinine, Ser: 0.8 mg/dL (ref 0.50–1.10)
GFR calc Af Amer: 76 mL/min — ABNORMAL LOW (ref >90)
GFR calc non Af Amer: 65 mL/min — ABNORMAL LOW (ref >90)
Glucose, Bld: 82 mg/dL (ref 70–99)
Potassium: 3.9 mmol/L (ref 3.5–5.1)
Sodium: 142 mmol/L (ref 135–145)
Total Bilirubin: 0.5 mg/dL (ref 0.3–1.2)
Total Protein: 6.4 g/dL (ref 6.0–8.3)

## 2014-03-03 LAB — LIPASE, BLOOD: Lipase: 29 U/L (ref 11–59)

## 2014-03-03 LAB — TROPONIN I: Troponin I: <0.03 ng/mL (ref <0.031)

## 2014-03-03 MED ORDER — IRBESARTAN 150 MG PO TABS
150.0000 mg | ORAL_TABLET | Freq: Every day | ORAL | Status: DC
  Administered 2014-03-04: 150 mg via ORAL
  Filled 2014-03-03: qty 1

## 2014-03-03 MED ORDER — TIOTROPIUM BROMIDE MONOHYDRATE 18 MCG IN CAPS
18.0000 ug | ORAL_CAPSULE | Freq: Every day | RESPIRATORY_TRACT | Status: DC | PRN
  Filled 2014-03-03: qty 5

## 2014-03-03 MED ORDER — HYDROCODONE-ACETAMINOPHEN 5-325 MG PO TABS: 1.0000 | ORAL_TABLET | ORAL | Status: DC | PRN

## 2014-03-03 MED ORDER — ACETAMINOPHEN 650 MG RE SUPP: 650.0000 mg | Freq: Four times a day (QID) | RECTAL | Status: DC | PRN

## 2014-03-03 MED ORDER — SODIUM CHLORIDE 0.9 % IV SOLN
INTRAVENOUS | Status: DC
  Administered 2014-03-03: via INTRAVENOUS

## 2014-03-03 MED ORDER — ASPIRIN EC 81 MG PO TBEC
81.0000 mg | DELAYED_RELEASE_TABLET | Freq: Every day | ORAL | Status: DC
  Administered 2014-03-04: 81 mg via ORAL
  Filled 2014-03-03: qty 1

## 2014-03-03 MED ORDER — SODIUM CHLORIDE 0.9 % IJ SOLN
3.0000 mL | Freq: Two times a day (BID) | INTRAMUSCULAR | Status: DC
  Administered 2014-03-03: 3 mL via INTRAVENOUS

## 2014-03-03 MED ORDER — ALUM & MAG HYDROXIDE-SIMETH 200-200-20 MG/5ML PO SUSP: 30.0000 mL | Freq: Four times a day (QID) | ORAL | Status: DC | PRN

## 2014-03-03 MED ORDER — PANTOPRAZOLE SODIUM 40 MG PO TBEC: 40.0000 mg | DELAYED_RELEASE_TABLET | Freq: Every day | ORAL | Status: DC

## 2014-03-03 MED ORDER — HEPARIN SODIUM (PORCINE) 5000 UNIT/ML IJ SOLN
5000.0000 [IU] | Freq: Three times a day (TID) | INTRAMUSCULAR | Status: DC
  Administered 2014-03-03 – 2014-03-04 (×2): 5000 [IU] via SUBCUTANEOUS
  Filled 2014-03-03 (×2): qty 1

## 2014-03-03 MED ORDER — NICOTINE 21 MG/24HR TD PT24
21.0000 mg | MEDICATED_PATCH | Freq: Every day | TRANSDERMAL | Status: DC
  Administered 2014-03-04: 21 mg via TRANSDERMAL
  Filled 2014-03-03: qty 1

## 2014-03-03 MED ORDER — ACETAMINOPHEN 325 MG PO TABS: 650.0000 mg | ORAL_TABLET | Freq: Four times a day (QID) | ORAL | Status: DC | PRN

## 2014-03-03 MED ORDER — ROSUVASTATIN CALCIUM 10 MG PO TABS: 40.0000 mg | ORAL_TABLET | Freq: Every day | ORAL | Status: DC

## 2014-03-03 MED ORDER — NITROGLYCERIN 0.4 MG SL SUBL: 0.4000 mg | SUBLINGUAL_TABLET | SUBLINGUAL | Status: DC | PRN

## 2014-03-03 NOTE — H&P (Addendum)
[facility] History and Physical  Leah Wiggins HEN:[id number] DOB: November 01, 1926 DOA: 03/03/2014  Referring physician: ED physician PCP: Maximino Greenland, MD  Specialists:   Chief Complaint: chest pain and R quadrant abdominal pain.  HPI: Leah Wiggins is a 80 y.o. female with the past medical history of diabetes mellitus, smoking, PVD, hypertension, hyperlipidemia, diabetes mellitus, history of remote R breast cancer 1991 (s/p of surgery, no radiation or chemotherapy), who presents with chest pain.  patient reports that at about 4:30 AM, she started having pain over her right lower chest and right upper quadrant abdomen. The pain was intermittent, happened 3 times during the day. Each time lasted for about 1-2 minutes. Patient has a chronic mild cough which is at her baseline. She does not have fever, chills, shortness of breath, cough.  Patient denies fever, chills,headaches, cough, SOB, diarrhea, constipation, dysuria, urgency, frequency, hematuria, skin rashes, or leg swelling.  Work up in the ED demonstrates negative troponin, negative urinalysis for UTI. Chest x-ray is negative for acute abnormalities. No leukocytosis. He is admitted to inpatient for further evaluation and treatment.  Review of Systems: As presented in the history of presenting illness, rest negative.  Where does patient live?  At home Can patient participate in ADLs? some  Allergy:  Allergies  Allergen Reactions  . Penicillins Other (See Comments)    unknown    Past Medical History  Diagnosis Date  . Breast cancer 1991  . Diabetes mellitus   . Non-stress test nonreactive, 04/24/12, normal 12/10/2012  . Tobacco use 12/10/2012  . PVD (peripheral vascular disease)   . Hypertension     Past Surgical History  Procedure Laterality Date  . Mastectomy partial / lumpectomy w/ axillary lymphadenectomy Right 1991   . Total hip arthroplasty Right 2011  . Lower extremity arterial doppler Bilateral  05/07/2012    Left ABI-demonstrated mild arterial insufficiency. Right CIA 50-69% diameter reduction. Right SFA less than 50% diameter reduction. Left SFA 70-99% diameter reduction. Bilateral Runoff-Posterior tibial arteries appeared occluded.  . Cardiovascular stress test  04/24/2012    No significant ST segment change suggestive of ischemia.    Social History:  reports that she has been smoking Cigarettes.  She started smoking about 67 years ago. She has a 15 pack-year smoking history. She does not have any smokeless tobacco history on file. She reports that she does not drink alcohol or use illicit drugs.  Family History:  Family History  Problem Relation Age of Onset  . Heart disease Mother   . Stroke Mother   . Stroke Father   . Cancer Sister   . Cancer Brother      Prior to Admission medications   Medication Sig Start Date End Date Taking? Authorizing Provider  alendronate (FOSAMAX) 70 MG tablet Take 70 mg by mouth every 7 (seven) days. Take with a full glass of water on an empty stomach. Wednesdays   Yes Historical Provider, MD  aspirin EC 81 MG tablet Take 81 mg by mouth daily.   Yes Historical Provider, MD  HYDROcodone-acetaminophen (NORCO) 5-325 MG per tablet Take 1 tablet by mouth every 4 (four) hours as needed for moderate pain. 12/19/13  Yes Liam Graham, PA-C  metFORMIN (GLUCOPHAGE-XR) 500 MG 24 hr tablet Take 500 mg by mouth 2 (two) times daily.   Yes Historical Provider, MD  rosuvastatin (CRESTOR) 40 MG tablet Take 40 mg by mouth daily.   Yes Historical Provider, MD  SPIRIVA HANDIHALER 18 MCG inhalation capsule Place 18  mcg into inhaler and inhale daily as needed (shortness of breath).  11/05/11  Yes Historical Provider, MD  valsartan (DIOVAN) 160 MG tablet Take 160 mg by mouth daily.     Yes Historical Provider, MD  Cholecalciferol (VITAMIN D3) 50000 UNITS CAPS Take 1 capsule by mouth once a week.    Historical Provider, MD  ciprofloxacin (CIPRO) 250 MG tablet Take 1  tablet (250 mg total) by mouth every 12 (twelve) hours. 10/08/13   Julianne Rice, MD    Physical Exam: Filed Vitals:   03/03/14 1745 03/03/14 1830 03/03/14 1855 03/03/14 1930  BP: 148/59 138/56 135/77 131/114  Pulse: 63 61 70 64  Temp:      TempSrc:      Resp: 22 16 15 16   SpO2: 100% 100% 100% 97%   General: Not in acute distress HEENT:       Eyes: PERRL, EOMI, no scleral icterus       ENT: No discharge from the ears and nose, no pharynx injection, no tonsillar enlargement.        Neck: No JVD, no bruit, no mass felt. Cardiac: S1/S2, RRR, No murmurs, No gallops or rubs Pulm: Good air movement bilaterally. Clear to auscultation bilaterally. No rales, wheezing, rhonchi or rubs. Abd: Soft, nondistended, mild tenderness over RUQ, no rebound pain, no organomegaly, BS present Ext: No edema bilaterally. 2+DP/PT pulse bilaterally Musculoskeletal: No joint deformities, erythema, or stiffness, ROM full Skin: No rashes.  Neuro: Alert and oriented X3, cranial nerves II-XII grossly intact, muscle strength 5/5 in all extremeties, sensation to light touch intact. Brachial reflex 2+ bilaterally. Knee reflex 1+ bilaterally.  Psych: Patient is not psychotic, no suicidal or hemocidal ideation.  Labs on Admission:  Basic Metabolic Panel:  Recent Labs Lab 03/03/14 1755  NA 142  K 3.9  CL 112  CO2 23  GLUCOSE 82  BUN 15  CREATININE 0.80  CALCIUM 8.9   Liver Function Tests:  Recent Labs Lab 03/03/14 1755  AST 19  ALT 14  ALKPHOS 60  BILITOT 0.5  PROT 6.4  ALBUMIN 3.3*    Recent Labs Lab 03/03/14 1755  LIPASE 29   No results for input(s): AMMONIA in the last 168 hours. CBC:  Recent Labs Lab 03/03/14 1755  WBC 6.6  NEUTROABS 4.2  HGB 12.0  HCT 36.0  MCV 76.6*  PLT 103*   Cardiac Enzymes:  Recent Labs Lab 03/03/14 1755  TROPONINI <0.03    BNP (last 3 results) No results for input(s): PROBNP in the last 8760 hours. CBG: No results for input(s): GLUCAP in the  last 168 hours.  Radiological Exams on Admission: Dg Chest 2 View  03/03/2014   CLINICAL DATA:  Chest pain for 1 day  EXAM: CHEST  2 VIEW  COMPARISON:  October 08, 2013  FINDINGS: There is mild scarring in the right base. There is no edema or consolidation. The heart size and pulmonary vascular normal. There is atherosclerotic change in aorta. There is advanced arthropathy in the left shoulder.  IMPRESSION: No edema or consolidation.  Mild scarring right base.   Electronically Signed   By: Lowella Grip M.D.   On: 03/03/2014 18:47    EKG: Independently reviewed. No significant ischemic change.  Assessment/Plan Principal Problem:   Chest pain Active Problems:   PAD (peripheral artery disease), abnormal ABIs   Non-stress test nonreactive, 04/24/12, normal   Dyslipidemia   Tobacco use   Essential hypertension   Diabetes mellitus without complication  Chest pain: Patient  has atypical chest pain. Chest x-ray is negative for infiltration. Pulmonary embolism is unlikely given that patient's chest pain is nonpleuritic, no shortness of breath, no tachycardia, no signs of a DVT). Another possibility is that her abdominal pain is radiating to the R lower chest.  -will admit to tele bed - Aspirin, nitroglycerin when necessary, Crestor -Troponin 3 -2D echo  - UDS -EKG in AM   RUQ abdominal pain: Initially patient presented to the ED with right lower chest pain. After carefully questioning, it seems that she has right upper quadrant abdominal pain. The potential differential diagnosis is gallstone stone. Patient's liver functions is normal. Lipase negative. Hemodynamically stable, no signs of infection. -will check US-abdomen -Protonix  DM-II: Last A1c 5.3 on 11/22/09. Patient is taking Metformin at home. - sliding scale insulin. -check A1c  Tobacco abuse: Patient smokes half pack a day for more than 60 years. -Continue spirava inhaler -Nicotine patch  HTN: On Diovan at home -switched  to Irbesartan  DVT ppx: SQ Heparin     Code Status: Full code Family Communication: None at bed side.      Disposition Plan: Admit to inpatient   Date of Service 03/03/2014    Ivor Costa [facility] Pager [phone]  If 7PM-7AM, please contact night-coverage [email or web address] Password [email or web address] 03/03/2014, 8:11 PM

## 2014-03-03 NOTE — ED Notes (Signed)
Attempted report x1. 

## 2014-03-03 NOTE — ED Provider Notes (Signed)
CSN: [id number]     Arrival date & time 03/03/14  1521 History   First MD Initiated Contact with Patient 03/03/14 1541     No chief complaint on file.  (Consider location/radiation/quality/duration/timing/severity/associated sxs/prior Treatment) Patient is a 80 y.o. female presenting with chest pain. The history is provided by the patient.  Chest Pain Pain location:  R chest Pain quality: aching   Pain radiates to:  R shoulder and L shoulder Pain radiates to the back: no   Pain severity:  Mild Onset quality:  Sudden Duration: awoke at 4am , unable to sleep after onset, felt different than prior pain episodes. Progression:  Resolved Chronicity:  Recurrent Context: at rest   Relieved by:  None tried Worsened by:  Nothing tried Ineffective treatments:  None tried Associated symptoms: diaphoresis and shortness of breath   Associated symptoms: no lower extremity edema, no palpitations and no PND   Associated symptoms comment:  Was diaphoretic most of day yest.   Past Medical History  Diagnosis Date  . Breast cancer 1991  . Diabetes mellitus   . Non-stress test nonreactive, 04/24/12, normal 12/10/2012  . Tobacco use 12/10/2012  . PVD (peripheral vascular disease)   . Hypertension    Past Surgical History  Procedure Laterality Date  . Mastectomy partial / lumpectomy w/ axillary lymphadenectomy Right 1991   . Total hip arthroplasty Right 2011  . Lower extremity arterial doppler Bilateral 05/07/2012    Left ABI-demonstrated mild arterial insufficiency. Right CIA 50-69% diameter reduction. Right SFA less than 50% diameter reduction. Left SFA 70-99% diameter reduction. Bilateral Runoff-Posterior tibial arteries appeared occluded.  . Cardiovascular stress test  04/24/2012    No significant ST segment change suggestive of ischemia.   Family History  Problem Relation Age of Onset  . Heart disease Mother   . Stroke Mother   . Stroke Father   . Cancer Sister   . Cancer Brother     History  Substance Use Topics  . Smoking status: Current Every Day Smoker -- 0.25 packs/day for 60 years    Types: Cigarettes    Start date: 03/08/1947  . Smokeless tobacco: Not on file  . Alcohol Use: No   OB History    No data available     Review of Systems  Constitutional: Positive for diaphoresis.  Respiratory: Positive for shortness of breath. Negative for wheezing.   Cardiovascular: Positive for chest pain. Negative for palpitations, leg swelling and PND.  Gastrointestinal: Negative.     Allergies  Penicillins  Home Medications   Prior to Admission medications   Medication Sig Start Date End Date Taking? Authorizing Provider  alendronate (FOSAMAX) 70 MG tablet Take 70 mg by mouth every 7 (seven) days. Take with a full glass of water on an empty stomach. Wednesdays    Historical Provider, MD  aspirin EC 81 MG tablet Take 81 mg by mouth daily.    Historical Provider, MD  Cholecalciferol (VITAMIN D3) 50000 UNITS CAPS Take 1 capsule by mouth once a week.    Historical Provider, MD  ciprofloxacin (CIPRO) 250 MG tablet Take 1 tablet (250 mg total) by mouth every 12 (twelve) hours. 10/08/13   Julianne Rice, MD  HYDROcodone-acetaminophen (NORCO) 5-325 MG per tablet Take 1 tablet by mouth every 4 (four) hours as needed for moderate pain. 12/19/13   Liam Graham, PA-C  metFORMIN (GLUCOPHAGE-XR) 500 MG 24 hr tablet Take 500 mg by mouth 2 (two) times daily.    Historical Provider, MD  rosuvastatin (  CRESTOR) 40 MG tablet Take 40 mg by mouth daily.    Historical Provider, MD  SPIRIVA HANDIHALER 18 MCG inhalation capsule Place 18 mcg into inhaler and inhale daily as needed (shortness of breath).  11/05/11   Historical Provider, MD  valsartan (DIOVAN) 160 MG tablet Take 160 mg by mouth daily.      Historical Provider, MD   BP 123/63 mmHg  Pulse 88  Temp(Src) 98 F (36.7 C) (Oral)  Resp 22  SpO2 96% Physical Exam  Constitutional: She is oriented to person, place, and time. She  appears well-developed and well-nourished. No distress.  HENT:  Head: Normocephalic.  Mouth/Throat: Oropharynx is clear and moist.  Eyes: Pupils are equal, round, and reactive to light.  Neck: Normal range of motion. Neck supple.  Cardiovascular: Normal rate, regular rhythm, normal heart sounds and intact distal pulses.   Pulmonary/Chest: Effort normal and breath sounds normal.  Abdominal: Soft. Bowel sounds are normal.  Lymphadenopathy:    She has no cervical adenopathy.  Neurological: She is alert and oriented to person, place, and time.  Skin: Skin is warm and dry.  Nursing note and vitals reviewed.   ED Course  Procedures (including critical care time) Labs Review Labs Reviewed - No data to display fcg wnl , unchanged from ecg 4/20 /2015 Imaging Review No results found.   MDM   1. Chest pain, atypical        Billy Fischer, MD 03/03/14 [phone]

## 2014-03-03 NOTE — ED Provider Notes (Signed)
CSN: [id number]     Arrival date & time 03/03/14  1648 History   First MD Initiated Contact with Patient 03/03/14 1658     Chief Complaint  Patient presents with  . Chest Pain     (Consider location/radiation/quality/duration/timing/severity/associated sxs/prior Treatment) HPI Comments: Patient with episode of chest pain onset last night when she woke from sleep. It was under her right breast and lasted for "a good minute" and is now resolved. It was associated with diaphoresis and shortness of breath. It resolved and recurred later around 11 AM. That pain lasted for a few minutes as well and is now resolved. Denies any shortness of breath or nausea.  she did have diaphoresis again. no fever, cough, shortness of breath, abdominal pain, nausea or vomiting. No focal weakness, numbness or tingling. No cardiac history. She is a diabetic with remote history of breast cancer.   The history is provided by the patient.    Past Medical History  Diagnosis Date  . Breast cancer 1991  . Diabetes mellitus   . Non-stress test nonreactive, 04/24/12, normal 12/10/2012  . Tobacco use 12/10/2012  . PVD (peripheral vascular disease)   . Hypertension    Past Surgical History  Procedure Laterality Date  . Mastectomy partial / lumpectomy w/ axillary lymphadenectomy Right 1991   . Total hip arthroplasty Right 2011  . Lower extremity arterial doppler Bilateral 05/07/2012    Left ABI-demonstrated mild arterial insufficiency. Right CIA 50-69% diameter reduction. Right SFA less than 50% diameter reduction. Left SFA 70-99% diameter reduction. Bilateral Runoff-Posterior tibial arteries appeared occluded.  . Cardiovascular stress test  04/24/2012    No significant ST segment change suggestive of ischemia.   Family History  Problem Relation Age of Onset  . Heart disease Mother   . Stroke Mother   . Stroke Father   . Cancer Sister   . Cancer Brother    History  Substance Use Topics  . Smoking status: Current  Every Day Smoker -- 0.25 packs/day for 60 years    Types: Cigarettes    Start date: 03/08/1947  . Smokeless tobacco: Not on file  . Alcohol Use: No   OB History    No data available     Review of Systems  Constitutional: Positive for diaphoresis. Negative for fever, activity change and appetite change.  HENT: Negative for congestion and rhinorrhea.   Respiratory: Positive for chest tightness and shortness of breath. Negative for cough.   Cardiovascular: Positive for chest pain.  Gastrointestinal: Negative for nausea, vomiting and abdominal pain.  Genitourinary: Negative for dysuria, hematuria, vaginal bleeding and vaginal discharge.  Musculoskeletal: Negative for myalgias and arthralgias.  Skin: Negative for rash.  A complete 10 system review of systems was obtained and all systems are negative except as noted in the HPI and PMH.      Allergies  Penicillins  Home Medications   Prior to Admission medications   Medication Sig Start Date End Date Taking? Authorizing Provider  alendronate (FOSAMAX) 70 MG tablet Take 70 mg by mouth every 7 (seven) days. Take with a full glass of water on an empty stomach. Wednesdays   Yes Historical Provider, MD  aspirin EC 81 MG tablet Take 81 mg by mouth daily.   Yes Historical Provider, MD  HYDROcodone-acetaminophen (NORCO) 5-325 MG per tablet Take 1 tablet by mouth every 4 (four) hours as needed for moderate pain. 12/19/13  Yes Freeman Caldron Baker, PA-C  metFORMIN (GLUCOPHAGE-XR) 500 MG 24 hr tablet Take 500 mg  by mouth 2 (two) times daily.   Yes Historical Provider, MD  rosuvastatin (CRESTOR) 40 MG tablet Take 40 mg by mouth daily.   Yes Historical Provider, MD  SPIRIVA HANDIHALER 18 MCG inhalation capsule Place 18 mcg into inhaler and inhale daily as needed (shortness of breath).  11/05/11  Yes Historical Provider, MD  valsartan (DIOVAN) 160 MG tablet Take 160 mg by mouth daily.     Yes Historical Provider, MD  Cholecalciferol (VITAMIN D3) 50000  UNITS CAPS Take 1 capsule by mouth once a week.    Historical Provider, MD  ciprofloxacin (CIPRO) 250 MG tablet Take 1 tablet (250 mg total) by mouth every 12 (twelve) hours. 10/08/13   Julianne Rice, MD   BP 118/44 mmHg  Pulse 74  Temp(Src) 97.8 F (36.6 C) (Oral)  Resp 18  Ht 5\' 1"  (1.549 m)  Wt 171 lb 9.6 oz (77.837 kg)  BMI 32.44 kg/m2  SpO2 99% Physical Exam  Constitutional: She is oriented to person, place, and time. She appears well-developed and well-nourished. No distress.  HENT:  Head: Normocephalic and atraumatic.  Mouth/Throat: Oropharynx is clear and moist. No oropharyngeal exudate.  Eyes: Conjunctivae and EOM are normal. Pupils are equal, round, and reactive to light.  Neck: Normal range of motion. Neck supple.  No meningismus.  Cardiovascular: Normal rate, regular rhythm, normal heart sounds and intact distal pulses.   No murmur heard. Pulmonary/Chest: Effort normal and breath sounds normal. No respiratory distress. She exhibits no tenderness.  Abdominal: Soft. There is no tenderness. There is no rebound and no guarding.  No epigastric or RUQ tenderness  Musculoskeletal: Normal range of motion. She exhibits no edema or tenderness.  Neurological: She is alert and oriented to person, place, and time. No cranial nerve deficit. She exhibits normal muscle tone. Coordination normal.  No ataxia on finger to nose bilaterally. No pronator drift. 5/5 strength throughout. CN 2-12 intact. Negative Romberg. Equal grip strength. Sensation intact. Gait is normal.   Skin: Skin is warm. No rash noted.  Psychiatric: She has a normal mood and affect. Her behavior is normal.  Nursing note and vitals reviewed.   ED Course  Procedures (including critical care time) Labs Review Labs Reviewed  CBC WITH DIFFERENTIAL - Abnormal; Notable for the following:    MCV 76.6 (*)    MCH 25.5 (*)    RDW 16.9 (*)    Platelets 103 (*)    All other components within normal limits  COMPREHENSIVE  METABOLIC PANEL - Abnormal; Notable for the following:    Albumin 3.3 (*)    GFR calc non Af Amer 65 (*)    GFR calc Af Amer 76 (*)    All other components within normal limits  URINALYSIS, ROUTINE W REFLEX MICROSCOPIC - Abnormal; Notable for the following:    Hgb urine dipstick TRACE (*)    Leukocytes, UA TRACE (*)    All other components within normal limits  URINE MICROSCOPIC-ADD ON - Abnormal; Notable for the following:    Squamous Epithelial / LPF FEW (*)    All other components within normal limits  LIPASE, BLOOD  TROPONIN I  TROPONIN I  PROTIME-INR  TROPONIN I  TROPONIN I  URINE RAPID DRUG SCREEN (HOSP PERFORMED)  COMPREHENSIVE METABOLIC PANEL  CBC    Imaging Review Dg Chest 2 View  03/03/2014   CLINICAL DATA:  Chest pain for 1 day  EXAM: CHEST  2 VIEW  COMPARISON:  October 08, 2013  FINDINGS: There is mild  scarring in the right base. There is no edema or consolidation. The heart size and pulmonary vascular normal. There is atherosclerotic change in aorta. There is advanced arthropathy in the left shoulder.  IMPRESSION: No edema or consolidation.  Mild scarring right base.   Electronically Signed   By: Lowella Grip M.D.   On: 03/03/2014 18:47     EKG Interpretation None      MDM   Final diagnoses:  Chest pain   Patient is difficult historian. Atypical chest pain 2 episodes earlier today. She was chest pain-free currently. EKG without acute ST changes. Aspirin given by EMS. Record review shows negative stress test February 2014.  Troponin negative.LFTs and lipase normal.  CXR negative.  No further chest pain in the ED>  Patient's description of chest pain has changed for multiple providers.  It seems atypical for ACS but her history is unreliable. In setting of multiple cardiac risk factors, will admit for rule out. D/w Dr. Blaine Hamper.     Ezequiel Essex, MD 03/04/14 [phone]

## 2014-03-03 NOTE — ED Notes (Signed)
To ED via [facility] for eval of cp from [facility]. Pt had 2 episodes of CP today. No pain now. Last episode at 11am today. [facility] gave pt 362mg  ASA. IV established at [facility]

## 2014-03-03 NOTE — ED Notes (Signed)
Pt is being transferred to the emergency room via EMS for chest pain. Gave report to charge nurse ED at [facility]

## 2014-03-03 NOTE — ED Notes (Signed)
Pt has been having chest pain since last night pt was told by PCP to go to emergency room. Pt is in no acute distress at this time

## 2014-03-04 ENCOUNTER — Inpatient Hospital Stay (HOSPITAL_COMMUNITY): Payer: Medicare Other

## 2014-03-04 DIAGNOSIS — I369 Nonrheumatic tricuspid valve disorder, unspecified: Secondary | ICD-10-CM

## 2014-03-04 LAB — COMPREHENSIVE METABOLIC PANEL
ALT: 12 U/L (ref 0–35)
AST: 18 U/L (ref 0–37)
Albumin: 3.2 g/dL — ABNORMAL LOW (ref 3.5–5.2)
Alkaline Phosphatase: 56 U/L (ref 39–117)
Anion gap: 7 (ref 5–15)
BUN: 14 mg/dL (ref 6–23)
CO2: 21 mmol/L (ref 19–32)
Calcium: 8.8 mg/dL (ref 8.4–10.5)
Chloride: 114 mEq/L — ABNORMAL HIGH (ref 96–112)
Creatinine, Ser: 0.9 mg/dL (ref 0.50–1.10)
GFR calc Af Amer: 66 mL/min — ABNORMAL LOW (ref >90)
GFR calc non Af Amer: 57 mL/min — ABNORMAL LOW (ref >90)
Glucose, Bld: 86 mg/dL (ref 70–99)
Potassium: 4 mmol/L (ref 3.5–5.1)
Sodium: 142 mmol/L (ref 135–145)
Total Bilirubin: 0.2 mg/dL — ABNORMAL LOW (ref 0.3–1.2)
Total Protein: 5.9 g/dL — ABNORMAL LOW (ref 6.0–8.3)

## 2014-03-04 LAB — HEMOGLOBIN A1C
Hgb A1c MFr Bld: 6.8 % — ABNORMAL HIGH (ref <5.7)
Mean Plasma Glucose: 148 mg/dL — ABNORMAL HIGH (ref <117)

## 2014-03-04 LAB — TROPONIN I
Troponin I: <0.03 ng/mL (ref <0.031)
Troponin I: <0.03 ng/mL (ref <0.031)

## 2014-03-04 LAB — RAPID URINE DRUG SCREEN, HOSP PERFORMED
Amphetamines: NOT DETECTED
Barbiturates: NOT DETECTED
Benzodiazepines: NOT DETECTED
Cocaine: NOT DETECTED
Opiates: NOT DETECTED
Tetrahydrocannabinol: NOT DETECTED

## 2014-03-04 LAB — GLUCOSE, CAPILLARY: Glucose-Capillary: 115 mg/dL — ABNORMAL HIGH (ref 70–99)

## 2014-03-04 LAB — CBC
HCT: 35.6 % — ABNORMAL LOW (ref 36.0–46.0)
Hemoglobin: 11.8 g/dL — ABNORMAL LOW (ref 12.0–15.0)
MCH: 26 pg (ref 26.0–34.0)
MCHC: 33.1 g/dL (ref 30.0–36.0)
MCV: 78.6 fL (ref 78.0–100.0)
Platelets: 101 10*3/uL — ABNORMAL LOW (ref 150–400)
RBC: 4.53 MIL/uL (ref 3.87–5.11)
RDW: 17.3 % — ABNORMAL HIGH (ref 11.5–15.5)
WBC: 6 10*3/uL (ref 4.0–10.5)

## 2014-03-04 LAB — PROTIME-INR
INR: 1.1 (ref 0.00–1.49)
Prothrombin Time: 14.3 seconds (ref 11.6–15.2)

## 2014-03-04 MED ORDER — PNEUMOCOCCAL VAC POLYVALENT 25 MCG/0.5ML IJ INJ: 0.5000 mL | INJECTION | INTRAMUSCULAR | Status: DC

## 2014-03-04 MED ORDER — PANTOPRAZOLE SODIUM 40 MG PO TBEC: 40.0000 mg | DELAYED_RELEASE_TABLET | Freq: Every day | ORAL | Status: DC

## 2014-03-04 MED ORDER — NICOTINE 21 MG/24HR TD PT24: 21.0000 mg | MEDICATED_PATCH | Freq: Every day | TRANSDERMAL | Status: DC

## 2014-03-04 NOTE — Discharge Instructions (Signed)
Follow with Primary MD Maximino Greenland, MD in 7 days   Get CBC, CMP, 2 view Chest X ray checked  by Primary MD next visit.    Activity: As tolerated with Full fall precautions use walker/cane & assistance as needed   Disposition Home    Diet: Heart Healthy  , with feeding assistance and aspiration precautions as needed.  For Heart failure patients - Check your Weight same time everyday, if you gain over 2 pounds, or you develop in leg swelling, experience more shortness of breath or chest pain, call your Primary MD immediately. Follow Cardiac Low Salt Diet and 1.8 lit/day fluid restriction.   On your next visit with your primary care physician please Get Medicines reviewed and adjusted.   Please request your Prim.MD to go over all Hospital Tests and Procedure/Radiological results at the follow up, please get all Hospital records sent to your Prim MD by signing hospital release before you go home.   If you experience worsening of your admission symptoms, develop shortness of breath, life threatening emergency, suicidal or homicidal thoughts you must seek medical attention immediately by calling 911 or calling your MD immediately  if symptoms less severe.  You Must read complete instructions/literature along with all the possible adverse reactions/side effects for all the Medicines you take and that have been prescribed to you. Take any new Medicines after you have completely understood and accpet all the possible adverse reactions/side effects.   Do not drive, operating heavy machinery, perform activities at heights, swimming or participation in water activities or provide baby sitting services if your were admitted for syncope or siezures until you have seen by Primary MD or a Neurologist and advised to do so again.  Do not drive when taking Pain medications.    Do not take more than prescribed Pain, Sleep and Anxiety Medications  Special Instructions: If you have smoked or chewed  Tobacco  in the last 2 yrs please stop smoking, stop any regular Alcohol  and or any Recreational drug use.  Wear Seat belts while driving.   Please note  You were cared for by a hospitalist during your hospital stay. If you have any questions about your discharge medications or the care you received while you were in the hospital after you are discharged, you can call the unit and asked to speak with the hospitalist on call if the hospitalist that took care of you is not available. Once you are discharged, your primary care physician will handle any further medical issues. Please note that NO REFILLS for any discharge medications will be authorized once you are discharged, as it is imperative that you return to your primary care physician (or establish a relationship with a primary care physician if you do not have one) for your aftercare needs so that they can reassess your need for medications and monitor your lab values.   Cardiac Diet A cardiac diet can help stop heart disease or a stroke from happening. It involves eating less unhealthy fats and eating more healthy fats.  FOODS TO AVOID OR LIMIT  Limit saturated fats. This type of fat is found in oils and dairy products, such as:  Coconut oil.  Palm oil.  Cocoa butter.  Butter.  Avoid trans-fat or hydrogenated oils. These are found in fried or pre-made baked goods, such as:  Margarine.  Pre-made cookies, cakes, and crackers.  Limit processed meats (hot dogs, deli meats, sausage) to 3 ounces a week.  Limit high-fat meats (marbled  meats, fried chicken, or chicken with skin) to 3 ounces a week.  Limit salt (sodium) to 1500 milligrams a day.   Limit sweets and drinks with added sugar to no more than 5 servings a week. One serving is:  1 tablespoon of sugar.  1 tablespoon of jelly or jam.   cup sorbet.  1 cup lemonade.   cup regular soda. EAT MORE OF THE FOLLOWING FOODS Fruit  Eat 4to 5 servings a day. One  serving of fruit is:  1 medium whole fruit.   cup dried fruit.   cup of fresh, frozen, or canned fruit.   cup 100% fruit juice. Vegetables  Eat 4 to 5 servings a day. One serving is:  1 cup raw leafy vegetables.   cup raw or cooked, cut-up vegetables.   cup vegetable juice. Whole Grains  Eat 3 servings a day (1 ounce equals 1 serving). Legumes (such as beans, peas, and lentils)   Eat at least 4 servings a week ( cup equals 1 serving). Nuts and Seeds   Eat at least 4 servings a week ( cup equals 1 serving). Dietary Fiber  Eat 20 to 30 grams a day. Some foods high in dietary fiber include:  Dried beans.  Citrus fruits.  Apples, bananas.  Broccoli, Brussels sprouts, and eggplant.  Oats. Omega-3 Fats  Eat food with omega-3 fats. You can also take a dietary pill (supplement) that has 1 gram of DHA and EPA. Have 3.5 ounces of fatty fish a week, such as:  Salmon.  Mackerel.  [place] tuna.  Sardines.  Lake trout.  Herring. PREPARING YOUR FOOD  Broil, bake, steam, or roast foods. Do not fry food. Do not cook food in butter (fat).  Use non-stick cooking sprays.  Remove skin from poultry, such as chicken and [place].  Remove fat from meat.  Take the fat off the top of stews, soups, and gravy.  Use lemon or herbs to flavor food instead of using butter or margarine.  Use nonfat yogurt, salsa, or low-fat dressings for salads. Document Released: 08/23/2011 Document Reviewed: 08/23/2011 [facility] Patient Information 2015 [place]. This information is not intended to replace advice given to you by your health care provider. Make sure you discuss any questions you have with your health care provider.

## 2014-03-04 NOTE — Progress Notes (Signed)
  Echocardiogram 2D Echocardiogram has been performed.  Diamond Nickel 03/04/2014, 9:54 AM

## 2014-03-04 NOTE — Discharge Summary (Signed)
Leah Wiggins, 80 y.o., DOB 1927-01-24, MRN [id number]. Admission date: 03/03/2014 Discharge Date 03/04/2014 Primary MD Gwynneth Aliment, MD Admitting Physician Lorretta Harp, MD  Admission Diagnosis  Chest pain [R07.9]  Discharge Diagnosis   Principal Problem:   Chest pain Active Problems:   Thrombocytopenia   PAD (peripheral artery disease), abnormal ABIs   Non-stress test nonreactive, 04/24/12, normal   Dyslipidemia   Tobacco use   Essential hypertension   Diabetes mellitus without complication      Past Medical History  Diagnosis Date  . Breast cancer 1991  . Diabetes mellitus   . Non-stress test nonreactive, 04/24/12, normal 12/10/2012  . Tobacco use 12/10/2012  . PVD (peripheral vascular disease)   . Hypertension     Past Surgical History  Procedure Laterality Date  . Mastectomy partial / lumpectomy w/ axillary lymphadenectomy Right 1991   . Total hip arthroplasty Right 2011  . Lower extremity arterial doppler Bilateral 05/07/2012    Left ABI-demonstrated mild arterial insufficiency. Right CIA 50-69% diameter reduction. Right SFA less than 50% diameter reduction. Left SFA 70-99% diameter reduction. Bilateral Runoff-Posterior tibial arteries appeared occluded.  . Cardiovascular stress test  04/24/2012    No significant ST segment change suggestive of ischemia.     Hospital Course See H&P, Labs, Consult and Test reports for all details in brief, patient was admitted for  Principal Problem:   Chest pain Active Problems:   Thrombocytopenia   PAD (peripheral artery disease), abnormal ABIs   Non-stress test nonreactive, 04/24/12, normal   Dyslipidemia   Tobacco use   Essential hypertension   Diabetes mellitus without complication  This 80 year old woman was admitted with abdominal and chest discomfort.  It began in the right lower quadrant of the abdomen and spread up to the right upper quadrant and then right into the right chest. The pain was intermittent. There was  no left-sided chest discomfort. The patient does not have any history of prior coronary artery disease. In 04/24/12 she underwent a Lexiscan Myoview stress test by Dr. Rennis Golden as an outpatient and there was no ischemia and her ejection fraction was 78%. It was a normal test. The patient does not have any history of exertional chest pain or angina pectoris. The patient has a history of diabetes mellitus and ongoing smoking. She's had a history of hypertension and hyperlipidemia. She had right mastectomy for breast cancer in 1991 with no evidence of recurrence. She was seen by cardiology regarding her chest pain, which was felt to be a typical most likely right upper quadrant abdominal pain, cardiac enzymes were negative, EKG showing no acute ischemic changes,  Regarding right upper quadrant abdominal pain, patient had an ultrasound done which did not show any cholelithiasis or evidence of acute cholecystitis, no evidence of nephrolithiasis or hydronephrosis, LFTs were within normal limit, lipase within normal limits as well, patient was started on diet and tolerated very well, she will be started on Protonix on discharge. - Patient was counseled to stop smoking, she would be given prescription for nicotine patch.  Consults   Cardiology  Significant Tests:  See full reports for all details    Dg Chest 2 View  03/03/2014   CLINICAL DATA:  Chest pain for 1 day  EXAM: CHEST  2 VIEW  COMPARISON:  October 08, 2013  FINDINGS: There is mild scarring in the right base. There is no edema or consolidation. The heart size and pulmonary vascular normal. There is atherosclerotic change in aorta. There is advanced arthropathy in  the left shoulder.  IMPRESSION: No edema or consolidation.  Mild scarring right base.   Electronically Signed   By: Bretta Bang M.D.   On: 03/03/2014 18:47   US Abdomen Complete  03/04/2014   CLINICAL DATA:  80 year old female with nausea, abdominal pain and bilateral flank pain  times 1-2 weeks  EXAM: ULTRASOUND ABDOMEN COMPLETE  COMPARISON:  01/18/2012  FINDINGS: Gallbladder: No gallstones or wall thickening visualized. No sonographic Murphy sign noted.  Common bile duct: Diameter: 4.0 mm, within normal range.  Liver: No focal lesion identified. The liver is mildly diffusely echogenic.  IVC: No abnormality visualized.  Pancreas: Visualized portion unremarkable.  Spleen: The spleen measures approximately 3.6 cm.  Right Kidney: Length: 10.4 cm. Echogenicity within normal limits. No mass or hydronephrosis visualized.  Left Kidney: Length: 10.1 cm. Echogenicity within normal limits. No mass or hydronephrosis visualized. A prominent column of Bertin is incidentally noted.  Abdominal aorta: The visualized aorta measures up to 2.4 cm. The distal aorta is obscured by bowel gas.  Other findings: None.  IMPRESSION: 1. No cholelithiasis or evidence of acute cholecystitis. 2. No evidence of nephrolithiasis or hydronephrosis. 3. Mild hepatic steatosis.   Electronically Signed   By: Fannie Knee   On: 03/04/2014 09:48     Today   Subjective:   Leah Wiggins today has no headache,no chest abdominal pain,no new weakness tingling or numbness, feels much better wants to go home today.  Objective:   Blood pressure 107/72, pulse 62, temperature 97.7 F (36.5 C), temperature source Oral, resp. rate 18, height 5\' 1"  (1.549 m), weight 77.701 kg (171 lb 4.8 oz), SpO2 99 %.  Intake/Output Summary (Last 24 hours) at 03/04/14 1236 Last data filed at 03/04/14 0800  Gross per 24 hour  Intake 724.25 ml  Output    100 ml  Net 624.25 ml    Exam Awake Alert, Oriented *3, No new F.N deficits, Normal affect [place].AT,PERRAL Supple Neck,No JVD, No cervical lymphadenopathy appriciated.  Symmetrical Chest wall movement, Good air movement bilaterally, CTAB RRR,No Gallops,Rubs or new Murmurs, No Parasternal Heave +ve B.Sounds, Abd Soft, Non tender, No organomegaly appriciated, No rebound -guarding  or rigidity. No Cyanosis, Clubbing or edema, No new Rash or bruise  Data Review   Cultures -   CBC w Diff:  Lab Results  Component Value Date   WBC 6.0 03/04/2014   WBC 6.6 07/22/2013   HGB 11.8* 03/04/2014   HGB 12.1 07/22/2013   HCT 35.6* 03/04/2014   HCT 37.9 07/22/2013   PLT 101* 03/04/2014   PLT 129* 07/22/2013   LYMPHOPCT 28 03/03/2014   LYMPHOPCT 29.7 07/22/2013   MONOPCT 7 03/03/2014   MONOPCT 8.3 07/22/2013   EOSPCT 1 03/03/2014   EOSPCT 2.3 07/22/2013   EOSPCT 0 01/17/2012   BASOPCT 1 03/03/2014   BASOPCT 1.1 07/22/2013   CMP:  Lab Results  Component Value Date   NA 142 03/04/2014   NA 143 07/25/2012   K 4.0 03/04/2014   K 4.0 07/25/2012   CL 114* 03/04/2014   CL 110* 07/25/2012   CO2 21 03/04/2014   CO2 24 07/25/2012   BUN 14 03/04/2014   BUN 14.2 07/25/2012   CREATININE 0.90 03/04/2014   CREATININE 1.0 07/25/2012   CREATININE 0.89 07/07/2010   PROT 5.9* 03/04/2014   ALBUMIN 3.2* 03/04/2014   BILITOT 0.2* 03/04/2014   ALKPHOS 56 03/04/2014   AST 18 03/04/2014   ALT 12 03/04/2014  .  Micro Results No results found  for this or any previous visit (from the past 240 hour(s)).   Discharge Instructions          Follow-up Information    Follow up with SANDERS,ROBYN N, MD. Schedule an appointment as soon as possible for a visit in 2 weeks.   Specialty:  Internal Medicine   Contact information:   [address] [place] [address] [phone]       Discharge Medications     Medication List    STOP taking these medications        ciprofloxacin 250 MG tablet  Commonly known as:  CIPRO      TAKE these medications        alendronate 70 MG tablet  Commonly known as:  FOSAMAX  Take 70 mg by mouth every 7 (seven) days. Take with a full glass of water on an empty stomach. Wednesdays     aspirin EC 81 MG tablet  Take 81 mg by mouth daily.     HYDROcodone-acetaminophen 5-325 MG per tablet  Commonly known as:  NORCO   Take 1 tablet by mouth every 4 (four) hours as needed for moderate pain.     metFORMIN 500 MG 24 hr tablet  Commonly known as:  GLUCOPHAGE-XR  Take 500 mg by mouth 2 (two) times daily.     nicotine 21 mg/24hr patch  Commonly known as:  NICODERM CQ - dosed in mg/24 hours  Place 1 patch (21 mg total) onto the skin daily.     pantoprazole 40 MG tablet  Commonly known as:  PROTONIX  Take 1 tablet (40 mg total) by mouth daily at 12 noon.     rosuvastatin 40 MG tablet  Commonly known as:  CRESTOR  Take 40 mg by mouth daily.     SPIRIVA HANDIHALER 18 MCG inhalation capsule  Generic drug:  tiotropium  Place 18 mcg into inhaler and inhale daily as needed (shortness of breath).     valsartan 160 MG tablet  Commonly known as:  DIOVAN  Take 160 mg by mouth daily.     Vitamin D3 50000 UNITS Caps  Take 1 capsule by mouth once a week.         Total Time in preparing paper work, data evaluation and todays exam - 35 minutes  Elanore Talcott M.D on 03/04/2014 at 12:36 PM  [facility] Office  [phone]

## 2014-03-04 NOTE — Consult Note (Signed)
CARDIOLOGY CONSULT NOTE   Patient ID: Leah Wiggins MRN: [id number], DOB/AGE: 1926/09/13   Admit date: 03/03/2014 Date of Consult: 03/04/2014   Primary Physician: Maximino Greenland, MD Primary Cardiologist: None  Pt. Profile  80 year old Wiggins admitted with atypical right-sided chest pain and right abdominal pain.  Problem List  Past Medical History  Diagnosis Date  . Breast cancer 1991  . Diabetes mellitus   . Non-stress test nonreactive, 04/24/12, normal 12/10/2012  . Tobacco use 12/10/2012  . PVD (peripheral vascular disease)   . Hypertension     Past Surgical History  Procedure Laterality Date  . Mastectomy partial / lumpectomy w/ axillary lymphadenectomy Right 1991   . Total hip arthroplasty Right 2011  . Lower extremity arterial doppler Bilateral 05/07/2012    Left ABI-demonstrated mild arterial insufficiency. Right CIA 50-69% diameter reduction. Right SFA less than 50% diameter reduction. Left SFA 70-99% diameter reduction. Bilateral Runoff-Posterior tibial arteries appeared occluded.  . Cardiovascular stress test  04/24/2012    No significant ST segment change suggestive of ischemia.     Allergies  Allergies  Allergen Reactions  . Penicillins Other (See Comments)    unknown    HPI   This 80 year old Wiggins was admitted with abdominal and chest discomfort.  The discomfort started yesterday.  It began in the right lower quadrant of the abdomen and spread up to the right upper quadrant and then right into the right chest.  The pain was intermittent.  There was no left-sided chest discomfort.  The patient does not have any history of prior coronary artery disease.  In 04/24/12 she underwent a Lexiscan Myoview stress test by Dr. Debara Pickett as an outpatient and there was no ischemia and her ejection fraction was 78%.  It was a normal test.  The patient does not have any history of exertional chest pain or angina pectoris.  The patient has a history of diabetes mellitus and  ongoing smoking.  She's had a history of hypertension and hyperlipidemia.  She had right mastectomy for breast cancer in 1991 with no evidence of recurrence.  Inpatient Medications  . aspirin EC  81 mg Oral Daily  . heparin  5,000 Units Subcutaneous 3 times per day  . irbesartan  150 mg Oral Daily  . nicotine  21 mg Transdermal Daily  . pantoprazole  40 mg Oral Q1200  . [START ON 03/05/2014] pneumococcal 23 valent vaccine  0.5 mL Intramuscular Tomorrow-1000  . rosuvastatin  40 mg Oral q1800  . sodium chloride  3 mL Intravenous Q12H    Family History Family History  Problem Relation Age of Onset  . Heart disease Mother   . Stroke Mother   . Stroke Father   . Cancer Sister   . Cancer Brother      Social History History   Social History  . Marital Status: Widowed    Spouse Name: N/A    Number of Children: N/A  . Years of Education: N/A   Occupational History  . Not on file.   Social History Main Topics  . Smoking status: Current Every Day Smoker -- 0.25 packs/day for 60 years    Types: Cigarettes    Start date: 03/08/1947  . Smokeless tobacco: Not on file  . Alcohol Use: No  . Drug Use: No  . Sexual Activity: Yes   Other Topics Concern  . Not on file   Social History Narrative     Review of Systems  General:  No chills, fever, night  sweats or weight changes.  Cardiovascular:  No chest pain, dyspnea on exertion, edema, orthopnea, palpitations, paroxysmal nocturnal dyspnea. Dermatological: No rash, lesions/masses Respiratory: No cough, dyspnea Urologic: No hematuria, dysuria Abdominal:   No nausea, vomiting, diarrhea, bright red blood per rectum, melena, or hematemesis Neurologic:  No visual changes, wkns, changes in mental status. All other systems reviewed and are otherwise negative except as noted above.  Physical Exam  Blood pressure 107/72, pulse 62, temperature 97.7 F (36.5 C), temperature source Oral, resp. rate 18, height 5\' 1"  (1.549 m), weight  171 lb 4.8 oz (77.701 kg), SpO2 99 %.  General: Pleasant, NAD Psych: Normal affect. Neuro: Alert and oriented X 3. Moves all extremities spontaneously. HEENT: Normal  Neck: Supple without bruits or JVD. Lungs:  Resp regular and unlabored, CTA.  There has been a prior right mastectomy. Heart: RRR no s3, s4, or murmurs. Abdomen: Soft, mild discomfort in right lower quadrant on deep palpation.  Bowel sounds are present  Extremities: No clubbing, cyanosis or edema.  Weak pedal pulses.  Labs   Recent Labs  03/03/14 1755 03/03/14 2340 03/04/14 0519  TROPONINI <0.03 <0.03 <0.03   Lab Results  Component Value Date   WBC 6.0 03/04/2014   HGB 11.8* 03/04/2014   HCT 35.6* 03/04/2014   MCV 78.6 03/04/2014   PLT 101* 03/04/2014     Recent Labs Lab 03/04/14 0519  NA 142  K 4.0  CL 114*  CO2 21  BUN 14  CREATININE 0.90  CALCIUM 8.8  PROT 5.9*  BILITOT 0.2*  ALKPHOS 56  ALT 12  AST 18  GLUCOSE 86   No results found for: CHOL, HDL, LDLCALC, TRIG No results found for: DDIMER  Radiology/Studies  Dg Chest 2 View  03/03/2014   CLINICAL DATA:  Chest pain for 1 day  EXAM: CHEST  2 VIEW  COMPARISON:  October 08, 2013  FINDINGS: There is mild scarring in the right base. There is no edema or consolidation. The heart size and pulmonary vascular normal. There is atherosclerotic change in aorta. There is advanced arthropathy in the left shoulder.  IMPRESSION: No edema or consolidation.  Mild scarring right base.   Electronically Signed   By: Lowella Grip M.D.   On: 03/03/2014 18:47    ECG  Normal sinus rhythm.  Occasional PVC.  No acute ischemic changes.  ASSESSMENT AND PLAN  1.  Atypical right-sided chest discomfort.  Cardiac enzymes are negative.  EKG shows no acute ischemic changes. 2.  Ongoing tobacco abuse 3.  Right-sided abdominal pain uncertain etiology  Recommendation: An echocardiogram has already been ordered and is pending.  No further cardiology tests indicated at  this time.  From cardiac standpoint could probably be discharged later today if echo is unremarkable   Signed, Darlin Coco, MD  03/04/2014, 8:24 AM

## 2014-03-10 DIAGNOSIS — Z79899 Other long term (current) drug therapy: Secondary | ICD-10-CM | POA: Diagnosis not present

## 2014-03-24 DIAGNOSIS — E119 Type 2 diabetes mellitus without complications: Secondary | ICD-10-CM | POA: Diagnosis not present

## 2014-03-24 DIAGNOSIS — H04123 Dry eye syndrome of bilateral lacrimal glands: Secondary | ICD-10-CM | POA: Diagnosis not present

## 2014-03-24 DIAGNOSIS — Z961 Presence of intraocular lens: Secondary | ICD-10-CM | POA: Diagnosis not present

## 2014-04-02 DIAGNOSIS — I129 Hypertensive chronic kidney disease with stage 1 through stage 4 chronic kidney disease, or unspecified chronic kidney disease: Secondary | ICD-10-CM | POA: Diagnosis not present

## 2014-04-02 DIAGNOSIS — N182 Chronic kidney disease, stage 2 (mild): Secondary | ICD-10-CM | POA: Diagnosis not present

## 2014-04-02 DIAGNOSIS — E1121 Type 2 diabetes mellitus with diabetic nephropathy: Secondary | ICD-10-CM | POA: Diagnosis not present

## 2014-04-02 DIAGNOSIS — R079 Chest pain, unspecified: Secondary | ICD-10-CM | POA: Diagnosis not present

## 2014-04-10 ENCOUNTER — Telehealth: Payer: Self-pay | Admitting: Cardiovascular Disease

## 2014-04-10 NOTE — Telephone Encounter (Signed)
Close encounter 

## 2014-04-14 DIAGNOSIS — H608X3 Other otitis externa, bilateral: Secondary | ICD-10-CM | POA: Diagnosis not present

## 2014-04-14 DIAGNOSIS — H903 Sensorineural hearing loss, bilateral: Secondary | ICD-10-CM | POA: Diagnosis not present

## 2014-04-14 DIAGNOSIS — D696 Thrombocytopenia, unspecified: Secondary | ICD-10-CM | POA: Diagnosis not present

## 2014-04-14 DIAGNOSIS — H6123 Impacted cerumen, bilateral: Secondary | ICD-10-CM | POA: Diagnosis not present

## 2014-04-15 ENCOUNTER — Other Ambulatory Visit: Payer: Self-pay | Admitting: Otolaryngology

## 2014-04-15 DIAGNOSIS — H903 Sensorineural hearing loss, bilateral: Secondary | ICD-10-CM

## 2014-04-28 ENCOUNTER — Emergency Department (HOSPITAL_COMMUNITY): Payer: Medicare Other

## 2014-04-28 ENCOUNTER — Emergency Department (INDEPENDENT_AMBULATORY_CARE_PROVIDER_SITE_OTHER)
Admission: EM | Admit: 2014-04-28 | Discharge: 2014-04-28 | Disposition: A | Discharge status: Home / Self Care | Payer: Medicare Other | Source: Home / Self Care | Attending: Emergency Medicine | Admitting: Emergency Medicine

## 2014-04-28 ENCOUNTER — Encounter (HOSPITAL_COMMUNITY): Payer: Self-pay | Admitting: Emergency Medicine

## 2014-04-28 ENCOUNTER — Inpatient Hospital Stay (HOSPITAL_COMMUNITY)
Admission: EM | Admit: 2014-04-28 | Discharge: 2014-04-29 | DRG: 313 | Disposition: A | Discharge status: Home / Self Care | Payer: Medicare Other | Attending: Internal Medicine | Admitting: Internal Medicine

## 2014-04-28 DIAGNOSIS — Z7982 Long term (current) use of aspirin: Secondary | ICD-10-CM

## 2014-04-28 DIAGNOSIS — R079 Chest pain, unspecified: Secondary | ICD-10-CM | POA: Diagnosis not present

## 2014-04-28 DIAGNOSIS — Z853 Personal history of malignant neoplasm of breast: Secondary | ICD-10-CM | POA: Diagnosis not present

## 2014-04-28 DIAGNOSIS — R0602 Shortness of breath: Secondary | ICD-10-CM | POA: Diagnosis not present

## 2014-04-28 DIAGNOSIS — R0789 Other chest pain: Secondary | ICD-10-CM | POA: Diagnosis not present

## 2014-04-28 DIAGNOSIS — E876 Hypokalemia: Secondary | ICD-10-CM | POA: Diagnosis not present

## 2014-04-28 DIAGNOSIS — Z79899 Other long term (current) drug therapy: Secondary | ICD-10-CM

## 2014-04-28 DIAGNOSIS — F17210 Nicotine dependence, cigarettes, uncomplicated: Secondary | ICD-10-CM | POA: Diagnosis present

## 2014-04-28 DIAGNOSIS — I10 Essential (primary) hypertension: Secondary | ICD-10-CM | POA: Diagnosis not present

## 2014-04-28 DIAGNOSIS — N951 Menopausal and female climacteric states: Secondary | ICD-10-CM | POA: Diagnosis present

## 2014-04-28 DIAGNOSIS — J449 Chronic obstructive pulmonary disease, unspecified: Secondary | ICD-10-CM | POA: Diagnosis present

## 2014-04-28 DIAGNOSIS — Z96641 Presence of right artificial hip joint: Secondary | ICD-10-CM | POA: Diagnosis present

## 2014-04-28 DIAGNOSIS — E785 Hyperlipidemia, unspecified: Secondary | ICD-10-CM | POA: Diagnosis present

## 2014-04-28 DIAGNOSIS — I739 Peripheral vascular disease, unspecified: Secondary | ICD-10-CM | POA: Diagnosis not present

## 2014-04-28 DIAGNOSIS — E119 Type 2 diabetes mellitus without complications: Secondary | ICD-10-CM | POA: Diagnosis present

## 2014-04-28 DIAGNOSIS — I972 Postmastectomy lymphedema syndrome: Secondary | ICD-10-CM | POA: Diagnosis present

## 2014-04-28 DIAGNOSIS — I2510 Atherosclerotic heart disease of native coronary artery without angina pectoris: Secondary | ICD-10-CM | POA: Diagnosis not present

## 2014-04-28 DIAGNOSIS — J9809 Other diseases of bronchus, not elsewhere classified: Secondary | ICD-10-CM | POA: Diagnosis not present

## 2014-04-28 DIAGNOSIS — I700 Atherosclerosis of aorta: Secondary | ICD-10-CM | POA: Diagnosis not present

## 2014-04-28 LAB — CBC WITH DIFFERENTIAL/PLATELET
Basophils Absolute: 0 10*3/uL (ref 0.0–0.1)
Basophils Relative: 1 % (ref 0–1)
Eosinophils Absolute: 0.1 10*3/uL (ref 0.0–0.7)
Eosinophils Relative: 1 % (ref 0–5)
HCT: 36.9 % (ref 36.0–46.0)
Hemoglobin: 12.1 g/dL (ref 12.0–15.0)
Lymphocytes Relative: 34 % (ref 12–46)
Lymphs Abs: 2.1 10*3/uL (ref 0.7–4.0)
MCH: 25.4 pg — ABNORMAL LOW (ref 26.0–34.0)
MCHC: 32.8 g/dL (ref 30.0–36.0)
MCV: 77.4 fL — ABNORMAL LOW (ref 78.0–100.0)
Monocytes Absolute: 0.5 10*3/uL (ref 0.1–1.0)
Monocytes Relative: 8 % (ref 3–12)
Neutro Abs: 3.3 10*3/uL (ref 1.7–7.7)
Neutrophils Relative %: 56 % (ref 43–77)
Platelets: 97 10*3/uL — ABNORMAL LOW (ref 150–400)
RBC: 4.77 MIL/uL (ref 3.87–5.11)
RDW: 16.7 % — ABNORMAL HIGH (ref 11.5–15.5)
WBC: 6 10*3/uL (ref 4.0–10.5)

## 2014-04-28 LAB — CBG MONITORING, ED: Glucose-Capillary: 134 mg/dL — ABNORMAL HIGH (ref 70–99)

## 2014-04-28 LAB — TSH: TSH: 1.169 u[IU]/mL (ref 0.350–4.500)

## 2014-04-28 LAB — COMPREHENSIVE METABOLIC PANEL
ALT: 11 U/L (ref 0–35)
AST: 12 U/L (ref 0–37)
Albumin: 2 g/dL — ABNORMAL LOW (ref 3.5–5.2)
Alkaline Phosphatase: 39 U/L (ref 39–117)
Anion gap: 3 — ABNORMAL LOW (ref 5–15)
BUN: 12 mg/dL (ref 6–23)
CO2: 19 mmol/L (ref 19–32)
Calcium: 6.3 mg/dL — CL (ref 8.4–10.5)
Chloride: 123 mmol/L — ABNORMAL HIGH (ref 96–112)
Creatinine, Ser: 0.46 mg/dL — ABNORMAL LOW (ref 0.50–1.10)
GFR calc Af Amer: >90 mL/min (ref >90)
GFR calc non Af Amer: 88 mL/min — ABNORMAL LOW (ref >90)
Glucose, Bld: 71 mg/dL (ref 70–99)
Potassium: 2.7 mmol/L — CL (ref 3.5–5.1)
Sodium: 145 mmol/L (ref 135–145)
Total Bilirubin: 0.3 mg/dL (ref 0.3–1.2)
Total Protein: 3.8 g/dL — ABNORMAL LOW (ref 6.0–8.3)

## 2014-04-28 LAB — BRAIN NATRIURETIC PEPTIDE: B Natriuretic Peptide: 21.3 pg/mL (ref 0.0–100.0)

## 2014-04-28 LAB — TROPONIN I
Troponin I: <0.03 ng/mL (ref <0.031)
Troponin I: <0.03 ng/mL (ref <0.031)
Troponin I: <0.03 ng/mL (ref <0.031)

## 2014-04-28 LAB — MAGNESIUM: Magnesium: 1.7 mg/dL (ref 1.5–2.5)

## 2014-04-28 LAB — GLUCOSE, CAPILLARY: Glucose-Capillary: 130 mg/dL — ABNORMAL HIGH (ref 70–99)

## 2014-04-28 LAB — D-DIMER, QUANTITATIVE: D-Dimer, Quant: 2.96 ug/mL-FEU — ABNORMAL HIGH (ref 0.00–0.48)

## 2014-04-28 MED ORDER — SODIUM CHLORIDE 0.9 % IJ SOLN
3.0000 mL | Freq: Two times a day (BID) | INTRAMUSCULAR | Status: DC
  Administered 2014-04-29: 3 mL via INTRAVENOUS

## 2014-04-28 MED ORDER — SODIUM CHLORIDE 0.9 % IV SOLN
INTRAVENOUS | Status: DC
  Administered 2014-04-28 (×2): via INTRAVENOUS

## 2014-04-28 MED ORDER — IOHEXOL 350 MG/ML SOLN
100.0000 mL | Freq: Once | INTRAVENOUS | Status: AC | PRN
  Administered 2014-04-28: 100 mL via INTRAVENOUS

## 2014-04-28 MED ORDER — POTASSIUM CHLORIDE CRYS ER 20 MEQ PO TBCR
80.0000 meq | EXTENDED_RELEASE_TABLET | Freq: Once | ORAL | Status: AC
  Administered 2014-04-28: 80 meq via ORAL
  Filled 2014-04-28: qty 4

## 2014-04-28 MED ORDER — INSULIN ASPART 100 UNIT/ML ~~LOC~~ SOLN
0.0000 [IU] | Freq: Three times a day (TID) | SUBCUTANEOUS | Status: DC
  Administered 2014-04-29: 2 [IU] via SUBCUTANEOUS

## 2014-04-28 MED ORDER — LEVALBUTEROL HCL 0.63 MG/3ML IN NEBU
0.6300 mg | INHALATION_SOLUTION | Freq: Four times a day (QID) | RESPIRATORY_TRACT | Status: DC
  Administered 2014-04-28 – 2014-04-29 (×2): 0.63 mg via RESPIRATORY_TRACT
  Filled 2014-04-28 (×5): qty 3

## 2014-04-28 MED ORDER — TIOTROPIUM BROMIDE MONOHYDRATE 18 MCG IN CAPS
18.0000 ug | ORAL_CAPSULE | Freq: Every day | RESPIRATORY_TRACT | Status: DC | PRN
  Filled 2014-04-28: qty 5

## 2014-04-28 MED ORDER — IRBESARTAN 75 MG PO TABS
37.5000 mg | ORAL_TABLET | Freq: Every day | ORAL | Status: DC
  Administered 2014-04-29: 37.5 mg via ORAL
  Filled 2014-04-28: qty 1

## 2014-04-28 MED ORDER — ONDANSETRON HCL 4 MG PO TABS: 4.0000 mg | ORAL_TABLET | Freq: Four times a day (QID) | ORAL | Status: DC | PRN

## 2014-04-28 MED ORDER — POTASSIUM CHLORIDE CRYS ER 20 MEQ PO TBCR
30.0000 meq | EXTENDED_RELEASE_TABLET | Freq: Three times a day (TID) | ORAL | Status: DC
  Filled 2014-04-28 (×2): qty 1

## 2014-04-28 MED ORDER — SODIUM CHLORIDE 0.9 % IV SOLN
1.0000 g | Freq: Once | INTRAVENOUS | Status: AC
  Administered 2014-04-28: 1 g via INTRAVENOUS
  Filled 2014-04-28: qty 10

## 2014-04-28 MED ORDER — ACETAMINOPHEN 650 MG RE SUPP: 650.0000 mg | Freq: Four times a day (QID) | RECTAL | Status: DC | PRN

## 2014-04-28 MED ORDER — ACETAMINOPHEN 325 MG PO TABS: 650.0000 mg | ORAL_TABLET | Freq: Four times a day (QID) | ORAL | Status: DC | PRN

## 2014-04-28 MED ORDER — ONDANSETRON HCL 4 MG/2ML IJ SOLN
4.0000 mg | Freq: Four times a day (QID) | INTRAMUSCULAR | Status: DC | PRN
Start: 2014-04-28 — End: 2014-04-29

## 2014-04-28 MED ORDER — IPRATROPIUM BROMIDE 0.02 % IN SOLN
0.5000 mg | Freq: Four times a day (QID) | RESPIRATORY_TRACT | Status: DC
  Administered 2014-04-28 – 2014-04-29 (×2): 0.5 mg via RESPIRATORY_TRACT
  Filled 2014-04-28 (×2): qty 2.5

## 2014-04-28 MED ORDER — ROSUVASTATIN CALCIUM 10 MG PO TABS
40.0000 mg | ORAL_TABLET | Freq: Every day | ORAL | Status: DC
  Administered 2014-04-29: 40 mg via ORAL
  Filled 2014-04-28: qty 4

## 2014-04-28 MED ORDER — NICOTINE 21 MG/24HR TD PT24
21.0000 mg | MEDICATED_PATCH | Freq: Every day | TRANSDERMAL | Status: DC
  Administered 2014-04-29: 21 mg via TRANSDERMAL
  Filled 2014-04-28: qty 1

## 2014-04-28 MED ORDER — PANTOPRAZOLE SODIUM 40 MG PO TBEC
40.0000 mg | DELAYED_RELEASE_TABLET | Freq: Every day | ORAL | Status: DC
  Administered 2014-04-29: 40 mg via ORAL
  Filled 2014-04-28: qty 1

## 2014-04-28 MED ORDER — SODIUM CHLORIDE 0.9 % IV SOLN
INTRAVENOUS | Status: DC
  Administered 2014-04-28: 18:00:00 via INTRAVENOUS

## 2014-04-28 MED ORDER — REGADENOSON 0.4 MG/5ML IV SOLN
0.4000 mg | Freq: Once | INTRAVENOUS | Status: AC
  Administered 2014-04-29: 0.4 mg via INTRAVENOUS
  Filled 2014-04-28: qty 5

## 2014-04-28 MED ORDER — POTASSIUM CHLORIDE CRYS ER 20 MEQ PO TBCR: 30.0000 meq | EXTENDED_RELEASE_TABLET | Freq: Three times a day (TID) | ORAL | Status: DC

## 2014-04-28 MED ORDER — ASPIRIN EC 81 MG PO TBEC
81.0000 mg | DELAYED_RELEASE_TABLET | Freq: Every day | ORAL | Status: DC
  Administered 2014-04-29: 81 mg via ORAL
  Filled 2014-04-28: qty 1

## 2014-04-28 MED ORDER — ENOXAPARIN SODIUM 40 MG/0.4ML ~~LOC~~ SOLN: 40.0000 mg | SUBCUTANEOUS | Status: DC

## 2014-04-28 MED ORDER — POTASSIUM CHLORIDE 10 MEQ/100ML IV SOLN
10.0000 meq | Freq: Once | INTRAVENOUS | Status: AC
  Administered 2014-04-28: 10 meq via INTRAVENOUS
  Filled 2014-04-28 (×2): qty 100

## 2014-04-28 MED ORDER — ENOXAPARIN SODIUM 40 MG/0.4ML ~~LOC~~ SOLN
40.0000 mg | SUBCUTANEOUS | Status: DC
  Administered 2014-04-28: 40 mg via SUBCUTANEOUS
  Filled 2014-04-28 (×2): qty 0.4

## 2014-04-28 NOTE — ED Notes (Signed)
CRITICAL VALUE ALERT  Critical value received:  Calcium  Date of notification:  04/28/14  Time of notification:  1600  Critical value read back:Yes.    Nurse who received alert:  Wilson Singer   MD notified (1st page):  Plunkett   Time of first page:  1600

## 2014-04-28 NOTE — ED Notes (Signed)
Pt in X ray

## 2014-04-28 NOTE — ED Notes (Signed)
CRITICAL VALUE ALERT  Critical value received:  Potassium  Date of notification:  04/28/14  Time of notification:  4431  Critical value read back:Yes.    Nurse who received alert:  Wilson Singer   MD notified (1st page):  Plunkett   Time of first page:  1600  Responding MD:  Maryan Rued

## 2014-04-28 NOTE — ED Notes (Addendum)
Pt presented to [facility] for RUQ abd pain that began last night, was seen here a month ago for the same. States she had some intermittent chest pain. Denies n/v/diarrhea. Pt alert and oriented x4. Ambulatory. Pain free per [facility]. No meds given. VSS.

## 2014-04-28 NOTE — ED Provider Notes (Signed)
Chief Complaint   Excessive Sweating and Hot Flashes   History of Present Illness    Leah Wiggins is a 81 year old female with a history of diabetes, hypercholesterolemia, hypertension, and COPD who presents with a history since last night of intermittent episodes of hot flashes, diaphoresis, burping, left submammary chest pain, and lightheadedness. She has not had any nausea or vomiting. She has some shortness of breath but states this is chronic and due to her COPD. Shortness of breath is not any different from usual. The pain is not pleuritic and is not exertional. Unrelated to position, activity, or meals. She denies any fever or chills. 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 She denies any hip pain right now. She has swelling of both of her ankles and her right arm which is secondary to lymphedema from breast cancer surgery. She denies any extremity pain. There is no coughing or wheezing. She's had some lower abdominal pain off-and-on for the past 2 months. She denies any chronic kidney disease, lupus, or systemic inflammatory state. She was seen in the emergency room the same or 28 for a similar episode.  Review of Systems    Other than noted above, the patient denies any of the following symptoms. Systemic:  No fever or chills. Pulmonary:  No cough, wheezing, shortness of breath, sputum production, hemoptysis. Cardiac:  No palpitations, rapid heartbeat, dizziness, presyncope or syncope. GI:  No abdominal pain, heartburn, nausea, or vomiting. Ext:  No leg pain or swelling.  [place]    Past medical history, family history, social history, meds, and allergies were reviewed. Current meds include Fosamax, aspirin, vitamin D3, metformin, Protonix, Crestor, Spiriva, and Diovan.  Physical Exam     Vital signs:  BP 138/71 mmHg  Pulse 75  Temp(Src) 99 F (37.2 C) (Oral)  Resp 16  SpO2  96% Gen:  Alert, oriented, in no distress, skin warm and dry. ENT:  Mucous membranes moist, pharynx clear. Neck:  Supple, no adenopathy or tenderness.  No JVD. Lungs:  Clear to auscultation, no wheezes, rales or rhonchi.  No respiratory distress. Heart:  Regular rhythm.  No gallops, murmers, clicks or rubs. Chest:  No chest wall tenderness. Abdomen:  Soft, nontender, no organomegaly or mass.  Bowel sounds normal.  No pulsatile abdominal mass or bruit. Ext:  No edema.  No calf tenderness and Homann's sign negative.  Pulses full and equal. Skin:  Warm and dry.  No rash.   EKG Results:  Date: 04/28/2014  Rate: 67  Rhythm: normal sinus rhythm  QRS Axis: normal--48  Intervals: normal--QTc interval 416 ms  ST/T Wave abnormalities: normal  Conduction Disutrbances:none  Narrative Interpretation: Normal sinus rhythm, normal EKG  Old EKG Reviewed: unchanged    Course in Urgent [place]   The following medications were given:  Medications  0.9 %  sodium chloride infusion (not administered)  Assessment     The encounter diagnosis was Chest pain, unspecified chest pain type.  Differential diagnosis is acute coronary syndrome, pulmonary embolism, ruptured aneurysm, pneumothorax, Boerhaave syndrome, pericarditis, musculoskeletal pain, or reflux esophagitis.   Plan     The patient was transferred to the ED via [place] in stable condition.  Medical Decision Making:  The patient is an 81 year old female with diabetes, hypercholesterolemia, hypertension, and COPD who presents with a history since last night of intermittent episodes of diaphoresis, has felt hot, has had burping, feels lightheaded, and has had some left submammary chest pain and she denies any nausea and her shortness of breath is about the same as always. She was seen at the emergency room for  something similar December 28 last year. She denies any fever or chills. She's had no coughing or sputum production. She has had some lower abdominal pain for the past 2 months. Both of her ankles are swollen, she denies any leg pain. She denies any pleuritic chest pain. She has no known history of cardiac disease. Her exam was unremarkable. EKG was within normal limits. We are going to start an IV and transfer her by [facility] due to suspicion of acute coronary syndrome. She's not having chest pain right now, so she was not given nitroglycerin. She took some aspirin this morning, so she was not given any additional aspirin.      Harden Mo, MD 04/28/14 1249

## 2014-04-28 NOTE — ED Provider Notes (Signed)
CSN: [id number]     Arrival date & time 04/28/14  1321 History   First MD Initiated Contact with Patient 04/28/14 1323     Chief Complaint  Patient presents with  . Abdominal Pain     (Consider location/radiation/quality/duration/timing/severity/associated sxs/prior Treatment) HPI Comments: Pt states over the last 24 hours she has had hot flashes and near syncope and dizziness when she stands up and tries to do any exertional activity.  Also intermittent sharp chest pain that is not reproducible since that time.  CP is intermittent and can occur at rest or with exertion.  Never had episode like this before.  Admitted at the end of December for right sided chest pain but only r/o by enzymes.  Denies cough, fever but does state worsening SOB than normal and mild leg swelling.  Patient is a 81 y.o. female presenting with chest pain. The history is provided by the patient.  Chest Pain Pain location:  L chest Pain quality: sharp and shooting   Pain radiates to:  Does not radiate Pain radiates to the back: no   Pain severity:  Moderate Onset quality:  Gradual Duration:  1 day Timing:  Intermittent Progression:  Resolved Chronicity:  New Relieved by:  None tried Worsened by:  Nothing tried Ineffective treatments:  None tried Associated symptoms: diaphoresis, dizziness, lower extremity edema, near-syncope and shortness of breath   Associated symptoms: no anorexia, no back pain and no cough   Risk factors: diabetes mellitus, high cholesterol, hypertension and smoking   Risk factors: no coronary artery disease, no immobilization and no prior DVT/PE     Past Medical History  Diagnosis Date  . Breast cancer 1991  . Diabetes mellitus   . Non-stress test nonreactive, 04/24/12, normal 12/10/2012  . Tobacco use 12/10/2012  . PVD (peripheral vascular disease)   . Hypertension    Past Surgical History  Procedure Laterality Date  . Mastectomy partial / lumpectomy w/ axillary lymphadenectomy  Right 1991   . Total hip arthroplasty Right 2011  . Lower extremity arterial doppler Bilateral 05/07/2012    Left ABI-demonstrated mild arterial insufficiency. Right CIA 50-69% diameter reduction. Right SFA less than 50% diameter reduction. Left SFA 70-99% diameter reduction. Bilateral Runoff-Posterior tibial arteries appeared occluded.  . Cardiovascular stress test  04/24/2012    No significant ST segment change suggestive of ischemia.   Family History  Problem Relation Age of Onset  . Heart disease Mother   . Stroke Mother   . Stroke Father   . Cancer Sister   . Cancer Brother    History  Substance Use Topics  . Smoking status: Current Every Day Smoker -- 0.25 packs/day for 60 years    Types: Cigarettes    Start date: 03/08/1947  . Smokeless tobacco: Not on file  . Alcohol Use: No   OB History    No data available     Review of Systems  Constitutional: Positive for diaphoresis.  Respiratory: Positive for shortness of breath. Negative for cough.   Cardiovascular: Positive for chest pain and near-syncope.  Gastrointestinal: Negative for anorexia.  Musculoskeletal: Negative for back pain.  Neurological: Positive for dizziness.  All other systems reviewed and are negative.     Allergies  Penicillins  Home Medications   Prior to Admission medications   Medication Sig Start Date End Date Taking? Authorizing Provider  alendronate (FOSAMAX) 70 MG tablet Take 70 mg by mouth every 7 (seven) days. Take with a full glass of water on an empty  stomach. Wednesdays    Historical Provider, MD  aspirin EC 81 MG tablet Take 81 mg by mouth daily.    Historical Provider, MD  Cholecalciferol (VITAMIN D3) 50000 UNITS CAPS Take 1 capsule by mouth once a week.    Historical Provider, MD  HYDROcodone-acetaminophen (NORCO) 5-325 MG per tablet Take 1 tablet by mouth every 4 (four) hours as needed for moderate pain. 12/19/13   Liam Graham, PA-C  metFORMIN (GLUCOPHAGE-XR) 500 MG 24 hr tablet  Take 500 mg by mouth 2 (two) times daily.    Historical Provider, MD  nicotine (NICODERM CQ - DOSED IN MG/24 HOURS) 21 mg/24hr patch Place 1 patch (21 mg total) onto the skin daily. 03/04/14   Dawood Elgergawy, MD  pantoprazole (PROTONIX) 40 MG tablet Take 1 tablet (40 mg total) by mouth daily at 12 noon. 03/04/14   Dawood Elgergawy, MD  rosuvastatin (CRESTOR) 40 MG tablet Take 40 mg by mouth daily.    Historical Provider, MD  SPIRIVA HANDIHALER 18 MCG inhalation capsule Place 18 mcg into inhaler and inhale daily as needed (shortness of breath).  11/05/11   Historical Provider, MD  valsartan (DIOVAN) 160 MG tablet Take 160 mg by mouth daily.      Historical Provider, MD   SpO2 100% Physical Exam  Constitutional: She is oriented to person, place, and time. She appears well-developed and well-nourished. No distress.  HENT:  Head: Normocephalic and atraumatic.  Mouth/Throat: Oropharynx is clear and moist.  Eyes: Conjunctivae and EOM are normal. Pupils are equal, round, and reactive to light.  Neck: Normal range of motion. Neck supple.  Cardiovascular: Normal rate, regular rhythm and intact distal pulses.   No murmur heard. Pulmonary/Chest: Effort normal and breath sounds normal. No respiratory distress. She has no wheezes. She has no rales. She exhibits no tenderness.  Abdominal: Soft. She exhibits no distension. There is no tenderness. There is no rebound and no guarding.  Musculoskeletal: Normal range of motion. She exhibits edema. She exhibits no tenderness.  Trace lower leg edema bilaterally.  Right arm non-pitting edema.  LUE wnl.  Neurological: She is alert and oriented to person, place, and time.  Skin: Skin is warm and dry. No rash noted. No erythema.  Psychiatric: She has a normal mood and affect. Her behavior is normal.  Nursing note and vitals reviewed.   ED Course  Procedures (including critical care time) Labs Review Labs Reviewed  COMPREHENSIVE METABOLIC PANEL - Abnormal;  Notable for the following:    Potassium 2.7 (*)    Chloride 123 (*)    Creatinine, Ser 0.46 (*)    Calcium 6.3 (*)    Total Protein 3.8 (*)    Albumin 2.0 (*)    GFR calc non Af Amer 88 (*)    Anion gap 3 (*)    All other components within normal limits  D-DIMER, QUANTITATIVE - Abnormal; Notable for the following:    D-Dimer, Quant 2.96 (*)    All other components within normal limits  CBC WITH DIFFERENTIAL/PLATELET - Abnormal; Notable for the following:    MCV 77.4 (*)    MCH 25.4 (*)    RDW 16.7 (*)    Platelets 97 (*)    All other components within normal limits  GLUCOSE, CAPILLARY - Abnormal; Notable for the following:    Glucose-Capillary 130 (*)    All other components within normal limits  CBG MONITORING, ED - Abnormal; Notable for the following:    Glucose-Capillary 134 (*)  All other components within normal limits  BRAIN NATRIURETIC PEPTIDE  TROPONIN I  MAGNESIUM  TSH  TROPONIN I  CBC WITH DIFFERENTIAL/PLATELET  TROPONIN I  TROPONIN I  HEMOGLOBIN A1C  URINALYSIS, ROUTINE W REFLEX MICROSCOPIC  CBC  COMPREHENSIVE METABOLIC PANEL  PTH, INTACT AND CALCIUM  VITAMIN D 25 HYDROXY    Imaging Review Dg Chest 2 View  04/28/2014   CLINICAL DATA:  81 year old female with history of syncope and few for the past 2 days.  EXAM: CHEST  2 VIEW  COMPARISON:  Chest x-ray 03/03/2014.  FINDINGS: Mild diffuse peribronchial cuffing. No acute consolidative airspace disease. No pleural effusions. No evidence of pulmonary edema. Heart size appears borderline enlarged, accentuated by portable AP technique. Upper mediastinal contours are within normal limits. Atherosclerosis in the thoracic aorta. Extensive degenerative changes in the left glenohumeral joint again noted.  IMPRESSION: 1. Diffuse peribronchial cuffing, concerning for acute bronchitis. 2. Atherosclerosis.   Electronically Signed   By: Vinnie Langton M.D.   On: 04/28/2014 15:49   Ct Angio Chest Pe W/cm &/or Wo  Cm  04/28/2014   CLINICAL DATA:  Dull pain in left chest. History of breast cancer with right mastectomy.  EXAM: CT ANGIOGRAPHY CHEST WITH CONTRAST  TECHNIQUE: Multidetector CT imaging of the chest was performed using the standard protocol during bolus administration of intravenous contrast. Multiplanar CT image reconstructions and MIPs were obtained to evaluate the vascular anatomy.  CONTRAST:  128mL OMNIPAQUE IOHEXOL 350 MG/ML SOLN  COMPARISON:  Chest x-ray earlier today.  FINDINGS: Patient is status post right mastectomy. No filling defects in the pulmonary arteries to suggest pulmonary emboli. Coronary artery calcifications within the left anterior descending, circumflex and right coronary arteries. Heart is normal size. Aortic arch and descending aortic calcifications. No aneurysm.  No mediastinal, hilar, or axillary adenopathy. Calcified granuloma in the lingula with calcified left hilar and mediastinal lymph nodes. No confluent airspace opacities. No effusions.  Imaging into the upper abdomen demonstrates calcifications within the visualized pancreatic body and tail compatible with chronic pancreatitis.  No acute bony abnormality. Degenerative changes throughout the thoracic spine.  Review of the MIP images confirms the above findings.  IMPRESSION: No evidence of pulmonary embolus.  Scattered Coronary artery disease throughout the 3 major coronary vessels.  Old granulomatous disease.  Chronic pancreatitis changes.   Electronically Signed   By: Rolm Baptise M.D.   On: 04/28/2014 17:20     EKG Interpretation   Date/Time:  Monday April 28 2014 14:53:47 EST Ventricular Rate:  56 PR Interval:    QRS Duration: 97 QT Interval:  425 QTC Calculation: 410 R Axis:   59 Text Interpretation:  Sinus bradycardia Low voltage, precordial leads No  significant change since last tracing Confirmed by Maryan Rued  MD, Loree Fee  [phone]) on 04/28/2014 3:07:13 PM      MDM   Final diagnoses:  Chest pain     Patient with a history of diabetes, high cholesterol, hypertension, COPD who presents today with findings concerning for cardiac etiology versus PE. She states in the last 24 hours that she has had feelings of hot flashes, diaphoresis, dizziness and near-syncope with any type of standing or exertion. She has had intermittent left sided chest pain as well as the last 24 hours. She denies any nausea, vomiting. She does say she is chronically short of breath because she smokes but feels that in the last 24 hours has been worse.  Patient was recently hospitalized at the end of December for right-sided  chest pain at that time she was ruled out by enzymes and was felt to be more of an abdominal pathology. She states she's never had symptoms like this before.  EKG done at urgent care and EKG here is without acute findings. Chest x-ray, CBC, CMP, BNP, troponin, d-dimer pending.  Currently patient is symptom-free and vital signs are within normal limits.  D-dimer is elevated at almost 3 and hypokalemia of 2.7 and new hypoalbuminemia and hypocalcemia.  CXR without acute findings and BNP, trop wnl.  CT pending.  Blanchie Dessert, MD 04/28/14 2134

## 2014-04-28 NOTE — ED Notes (Signed)
PT ambulated to bathroom. Steady gait. Family in bathroom with pt.

## 2014-04-28 NOTE — ED Notes (Signed)
[facility] called to transer pt to ED.  ED Charge, Melissa given report.

## 2014-04-28 NOTE — Discharge Instructions (Signed)
We have determined that your problem requires further evaluation in the emergency department.  We will take care of your transport there.  Once at the emergency department, you will be evaluated by a provider and they will order whatever treatment or tests they deem necessary.  We cannot guarantee that they will do any specific test or do any specific treatment.  ° °

## 2014-04-28 NOTE — ED Notes (Signed)
Attempted to call report

## 2014-04-28 NOTE — ED Notes (Signed)
EDP gave verbal eating order. Pt ate a [place] sandwich and had water. Pt now has NPO order for nuclear med study.

## 2014-04-28 NOTE — ED Notes (Signed)
Pt states she has felt dizzy since last night and felt like she was going to pass out when standing up. Pt denies pain at this time.

## 2014-04-28 NOTE — ED Notes (Signed)
Pt has been suffering from hot flashes with sweating since Sunday.  She reports a dull pain below her left breast over the weekend and has been burping a lot.  Pt denies pain at this time.

## 2014-04-28 NOTE — ED Provider Notes (Signed)
Patient initially seen and evaluated by Dr. Maryan Rued and had been sent for CT angiogram to look for possible pulmonary embolism as cause for chest pain and exertional dizziness. CT angiogram is come back unremarkable. Patient needs further evaluation for possible cardiac cause of her symptoms. Case is discussed with Dr. Allyson Sabal of [facility] who agrees to admit the patient.  Results for orders placed or performed during the hospital encounter of 04/28/14  Comprehensive metabolic panel  Result Value Ref Range   Sodium 145 135 - 145 mmol/L   Potassium 2.7 (LL) 3.5 - 5.1 mmol/L   Chloride 123 (H) 96 - 112 mmol/L   CO2 19 19 - 32 mmol/L   Glucose, Bld 71 70 - 99 mg/dL   BUN 12 6 - 23 mg/dL   Creatinine, Ser 0.46 (L) 0.50 - 1.10 mg/dL   Calcium 6.3 (LL) 8.4 - 10.5 mg/dL   Total Protein 3.8 (L) 6.0 - 8.3 g/dL   Albumin 2.0 (L) 3.5 - 5.2 g/dL   AST 12 0 - 37 U/L   ALT 11 0 - 35 U/L   Alkaline Phosphatase 39 39 - 117 U/L   Total Bilirubin 0.3 0.3 - 1.2 mg/dL   GFR calc non Af Amer 88 (L) >90 mL/min   GFR calc Af Amer >90 >90 mL/min   Anion gap 3 (L) 5 - 15  Brain natriuretic peptide  Result Value Ref Range   B Natriuretic Peptide 21.3 0.0 - 100.0 pg/mL  Troponin I  Result Value Ref Range   Troponin I <0.03 <0.031 ng/mL  D-dimer, quantitative  Result Value Ref Range   D-Dimer, Quant 2.96 (H) 0.00 - 0.48 ug/mL-FEU  CBC with Differential/Platelet  Result Value Ref Range   WBC 6.0 4.0 - 10.5 K/uL   RBC 4.77 3.87 - 5.11 MIL/uL   Hemoglobin 12.1 12.0 - 15.0 g/dL   HCT 36.9 36.0 - 46.0 %   MCV 77.4 (L) 78.0 - 100.0 fL   MCH 25.4 (L) 26.0 - 34.0 pg   MCHC 32.8 30.0 - 36.0 g/dL   RDW 16.7 (H) 11.5 - 15.5 %   Platelets PENDING 150 - 400 K/uL   Neutrophils Relative % 56 43 - 77 %   Neutro Abs 3.3 1.7 - 7.7 K/uL   Lymphocytes Relative 34 12 - 46 %   Lymphs Abs 2.1 0.7 - 4.0 K/uL   Monocytes Relative 8 3 - 12 %   Monocytes Absolute 0.5 0.1 - 1.0 K/uL   Eosinophils Relative 1 0 - 5 %    Eosinophils Absolute 0.1 0.0 - 0.7 K/uL   Basophils Relative 1 0 - 1 %   Basophils Absolute 0.0 0.0 - 0.1 K/uL   Dg Chest 2 View  04/28/2014   CLINICAL DATA:  81 year old female with history of syncope and few for the past 2 days.  EXAM: CHEST  2 VIEW  COMPARISON:  Chest x-ray 03/03/2014.  FINDINGS: Mild diffuse peribronchial cuffing. No acute consolidative airspace disease. No pleural effusions. No evidence of pulmonary edema. Heart size appears borderline enlarged, accentuated by portable AP technique. Upper mediastinal contours are within normal limits. Atherosclerosis in the thoracic aorta. Extensive degenerative changes in the left glenohumeral joint again noted.  IMPRESSION: 1. Diffuse peribronchial cuffing, concerning for acute bronchitis. 2. Atherosclerosis.   Electronically Signed   By: Vinnie Langton M.D.   On: 04/28/2014 15:49   Ct Angio Chest Pe W/cm &/or Wo Cm  04/28/2014   CLINICAL DATA:  Dull pain in  left chest. History of breast cancer with right mastectomy.  EXAM: CT ANGIOGRAPHY CHEST WITH CONTRAST  TECHNIQUE: Multidetector CT imaging of the chest was performed using the standard protocol during bolus administration of intravenous contrast. Multiplanar CT image reconstructions and MIPs were obtained to evaluate the vascular anatomy.  CONTRAST:  117mL OMNIPAQUE IOHEXOL 350 MG/ML SOLN  COMPARISON:  Chest x-ray earlier today.  FINDINGS: Patient is status post right mastectomy. No filling defects in the pulmonary arteries to suggest pulmonary emboli. Coronary artery calcifications within the left anterior descending, circumflex and right coronary arteries. Heart is normal size. Aortic arch and descending aortic calcifications. No aneurysm.  No mediastinal, hilar, or axillary adenopathy. Calcified granuloma in the lingula with calcified left hilar and mediastinal lymph nodes. No confluent airspace opacities. No effusions.  Imaging into the upper abdomen demonstrates calcifications within the  visualized pancreatic body and tail compatible with chronic pancreatitis.  No acute bony abnormality. Degenerative changes throughout the thoracic spine.  Review of the MIP images confirms the above findings.  IMPRESSION: No evidence of pulmonary embolus.  Scattered Coronary artery disease throughout the 3 major coronary vessels.  Old granulomatous disease.  Chronic pancreatitis changes.   Electronically Signed   By: Rolm Baptise M.D.   On: 04/28/2014 31:49      Delora Fuel, MD 70/26/37 [id number]

## 2014-04-28 NOTE — H&P (Addendum)
[facility] History and Physical  Leah Wiggins MKL:[id number] DOB: 06/03/1926 DOA: 04/28/2014  Referring physician:  PCP: Maximino Greenland, MD   Chief Complaint: Hot flashes  HPI:  81 year old female with a history of diabetes, hypercholesterolemia, hypertension, and COPD who presents with a history since last night of intermittent episodes of hot flashes, diaphoresis, burping, left submammary chest pain, and lightheadedness, that come on with exertion as well as rest. These flashes travel throughout her whole body. She has some shortness of breath but states this is chronic and due to her COPD she continues to smoke half a pack a day since age 42.  The pain is not pleuritic and is not exertional. Unrelated to position, activity, or meals. She denies any subjective fever or chills. She denies any cough, denies urinary urgency frequency or dysuria. 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 She denies any hip pain right now. She has swelling of both of her ankles and her right arm which is secondary to lymphedema from breast cancer surgery. She denies any extremity pain. There is no coughing or wheezing. She's had some lower abdominal pain off-and-on for the past 2 months. She denies any chronic kidney disease, lupus, or systemic inflammatory state. ER workup showed sinus bradycardia, CTA with coronary calcifications, troponin negative 1    Review of Systems: negative for the following  Constitutional: Denies fever, chills, diaphoresis, appetite change and fatigue.  HEENT: Denies photophobia, eye pain, redness, hearing loss, ear pain, congestion, sore throat, rhinorrhea, sneezing, mouth sores, trouble swallowing, neck pain, neck stiffness and tinnitus.  Respiratory: positive SOB, DOE, cough, chest tightness, and wheezing.  Cardiovascular: positive  chest pain, palpitations and leg swelling.   Gastrointestinal: Denies nausea, vomiting, abdominal pain, diarrhea, constipation, blood in stool and abdominal distention.  Genitourinary: Denies dysuria, urgency, frequency, hematuria, flank pain and difficulty urinating.  Musculoskeletal: Denies myalgias, back pain, swelling right upper extremity, j , arthralgias and gait problem.  Skin: Denies pallor, rash and wound.  Neurological: Denies dizziness, seizures, syncope, weakness, light-headedness, numbness and headaches.  Hematological: Denies adenopathy. Easy bruising, personal or family bleeding history  Psychiatric/Behavioral: Denies suicidal ideation, mood changes, confusion, nervousness, sleep disturbance and agitation       Past Medical History  Diagnosis Date  . Breast cancer 1991  . Diabetes mellitus   . Non-stress test nonreactive, 04/24/12, normal 12/10/2012  . Tobacco use 12/10/2012  . PVD (peripheral vascular disease)   . Hypertension      Past Surgical History  Procedure Laterality Date  . Mastectomy partial / lumpectomy w/ axillary lymphadenectomy Right 1991   . Total hip arthroplasty Right 2011  . Lower extremity arterial doppler Bilateral 05/07/2012    Left ABI-demonstrated mild arterial insufficiency. Right CIA 50-69% diameter reduction. Right SFA less than 50% diameter reduction. Left SFA 70-99% diameter reduction. Bilateral Runoff-Posterior tibial arteries appeared occluded.  . Cardiovascular stress test  04/24/2012    No significant ST segment change suggestive of ischemia.      Social History:  reports that she has been smoking Cigarettes.  She started smoking about 67 years ago. She has a 15 pack-year smoking history. She does not have any smokeless tobacco history on file. She reports that she does not drink alcohol or use illicit drugs.    Allergies  Allergen Reactions  . Penicillins Other (See Comments)    unknown    Family History  Problem Relation Age of Onset  . Heart disease Mother   . Stroke  Mother   . Stroke Father   . Cancer Sister   . Cancer Brother           Prior to Admission medications   Medication Sig Start Date End Date Taking? Authorizing Provider  alendronate (FOSAMAX) 70 MG tablet Take 70 mg by mouth every 7 (seven) days. Take with a full glass of water on an empty stomach. Wednesdays   Yes Historical Provider, MD  aspirin EC 81 MG tablet Take 81 mg by mouth daily.   Yes Historical Provider, MD  metFORMIN (GLUCOPHAGE-XR) 500 MG 24 hr tablet Take 500 mg by mouth 2 (two) times daily.   Yes Historical Provider, MD  pravastatin (PRAVACHOL) 40 MG tablet Take 40 mg by mouth daily.   Yes Historical Provider, MD  SPIRIVA HANDIHALER 18 MCG inhalation capsule Place 18 mcg into inhaler and inhale daily as needed (shortness of breath).  11/05/11  Yes Historical Provider, MD  valsartan (DIOVAN) 160 MG tablet Take 160 mg by mouth daily.     Yes Historical Provider, MD  Cholecalciferol (VITAMIN D3) 50000 UNITS CAPS Take 1 capsule by mouth once a week.    Historical Provider, MD  HYDROcodone-acetaminophen (NORCO) 5-325 MG per tablet Take 1 tablet by mouth every 4 (four) hours as needed for moderate pain. 12/19/13   Liam Graham, PA-C  nicotine (NICODERM CQ - DOSED IN MG/24 HOURS) 21 mg/24hr patch Place 1 patch (21 mg total) onto the skin daily. 03/04/14   Dawood Elgergawy, MD  pantoprazole (PROTONIX) 40 MG tablet Take 1 tablet (40 mg total) by mouth daily at 12 noon. 03/04/14   Dawood Elgergawy, MD  rosuvastatin (CRESTOR) 40 MG tablet Take 40 mg by mouth daily.    Historical Provider, MD     Physical Exam: Filed Vitals:   04/28/14 1536 04/28/14 1545 04/28/14 1600 04/28/14 1718  BP: 81/60 112/34 109/26 144/63  Pulse: 57 61 61 73  Resp:  20 16 21   SpO2: 100% 97% 99% 98%     Constitutional: Vital signs reviewed. Patient is a well-developed and well-nourished in no acute distress and cooperative with exam. Alert and oriented x3.  Head: Normocephalic and atraumatic  Ear: TM  normal bilaterally  Mouth: no erythema or exudates, MMM  Eyes: PERRL, EOMI, conjunctivae normal, No scleral icterus.  Neck: Supple, Trachea midline normal ROM, No JVD, mass, thyromegaly, or carotid bruit present.  Cardiovascular: RRR, S1 normal, S2 normal, no MRG, pulses symmetric and intact bilaterally  Pulmonary/Chest: CTAB, no wheezes, rales, or rhonchi  Abd: Soft, nondistended, mild tenderness over RUQ, no rebound pain, no organomegaly, BS present.  GU: no CVA tenderness Musculoskeletal: No joint deformities, erythema, or stiffness, ROM full and no nontender Ext: no edema and no cyanosis, pulses palpable bilaterally (DP and PT)  Hematology: no cervical, inginal, or axillary adenopathy.  Neurological: A&O x3, Strenght is normal and symmetric bilaterally, cranial nerve II-XII are grossly intact, no focal motor deficit, sensory intact to light touch bilaterally.  Skin: Warm, dry and intact. No rash, cyanosis, or clubbing.  Psychiatric: Normal mood and affect. speech and behavior is normal. Judgment and thought content normal. Cognition and memory are normal.       Labs on Admission:    Basic Metabolic Panel:  Recent Labs Lab 04/28/14 1449  NA 145  K 2.7*  CL 123*  CO2 19  GLUCOSE 71  BUN 12  CREATININE 0.46*  CALCIUM  6.3*   Liver Function Tests:  Recent Labs Lab 04/28/14 1449  AST 12  ALT 11  ALKPHOS 39  BILITOT 0.3  PROT 3.8*  ALBUMIN 2.0*   No results for input(s): LIPASE, AMYLASE in the last 168 hours. No results for input(s): AMMONIA in the last 168 hours. CBC:  Recent Labs Lab 04/28/14 1640  WBC 6.0  NEUTROABS 3.3  HGB 12.1  HCT 36.9  MCV 77.4*  PLT PENDING   Cardiac Enzymes:  Recent Labs Lab 04/28/14 1449  TROPONINI <0.03    BNP (last 3 results)  Recent Labs  04/28/14 1449  BNP 21.3    ProBNP (last 3 results) No results for input(s): PROBNP in the last 8760 hours.     CBG: No results for input(s): GLUCAP in the last 168  hours.  Radiological Exams on Admission: Dg Chest 2 View  04/28/2014   CLINICAL DATA:  81 year old female with history of syncope and few for the past 2 days.  EXAM: CHEST  2 VIEW  COMPARISON:  Chest x-ray 03/03/2014.  FINDINGS: Mild diffuse peribronchial cuffing. No acute consolidative airspace disease. No pleural effusions. No evidence of pulmonary edema. Heart size appears borderline enlarged, accentuated by portable AP technique. Upper mediastinal contours are within normal limits. Atherosclerosis in the thoracic aorta. Extensive degenerative changes in the left glenohumeral joint again noted.  IMPRESSION: 1. Diffuse peribronchial cuffing, concerning for acute bronchitis. 2. Atherosclerosis.   Electronically Signed   By: Vinnie Langton M.D.   On: 04/28/2014 15:49   Ct Angio Chest Pe W/cm &/or Wo Cm  04/28/2014   CLINICAL DATA:  Dull pain in left chest. History of breast cancer with right mastectomy.  EXAM: CT ANGIOGRAPHY CHEST WITH CONTRAST  TECHNIQUE: Multidetector CT imaging of the chest was performed using the standard protocol during bolus administration of intravenous contrast. Multiplanar CT image reconstructions and MIPs were obtained to evaluate the vascular anatomy.  CONTRAST:  158mL OMNIPAQUE IOHEXOL 350 MG/ML SOLN  COMPARISON:  Chest x-ray earlier today.  FINDINGS: Patient is status post right mastectomy. No filling defects in the pulmonary arteries to suggest pulmonary emboli. Coronary artery calcifications within the left anterior descending, circumflex and right coronary arteries. Heart is normal size. Aortic arch and descending aortic calcifications. No aneurysm.  No mediastinal, hilar, or axillary adenopathy. Calcified granuloma in the lingula with calcified left hilar and mediastinal lymph nodes. No confluent airspace opacities. No effusions.  Imaging into the upper abdomen demonstrates calcifications within the visualized pancreatic body and tail compatible with chronic pancreatitis.   No acute bony abnormality. Degenerative changes throughout the thoracic spine.  Review of the MIP images confirms the above findings.  IMPRESSION: No evidence of pulmonary embolus.  Scattered Coronary artery disease throughout the 3 major coronary vessels.  Old granulomatous disease.  Chronic pancreatitis changes.   Electronically Signed   By: Rolm Baptise M.D.   On: 04/28/2014 17:20    EKG: Independently reviewed.   Assessment/Plan Active Problems:   PAD (peripheral artery disease), abnormal ABIs   Essential hypertension   Chest pain   Pain in the chest   Chest pain: Patient has atypical chest pain. Chest x-ray is negative for infiltration. CTA negative for pulmonary embolism but shows scattered coronary artery calcifications Given multiple risk factors, will keep nothing by mouth after midnight Discussed with Dr. Ellyn Hack The patient will have a stress  Myoview in the morning -will admit to tele bed - Aspirin, nitroglycerin when necessary, Crestor -Troponin 3 -2D echo done during  last admission reviewed, EF of 50-60%, wall motion was normal EKG shows sinus bradycardia  Abnormal d-dimer History of breast cancer, right upper extremity swelling Will obtain venous Doppler of bilateral upper and lower extremities    Hypokalemia/hypocalcemia Replete potassium Replete calcium Check PTH, vitamin D levels, TSH  DM-II:  Hemoglobin A1c 6.8 in December 2015 We'll repeat Hold metformin - sliding scale insulin. -check A1c  Tobacco abuse: Patient smokes half pack a day for more than 60 years. -Continue spirava inhaler -Nicotine patch  HTN: Continue Irbesartan  DVT ppx: SQ Heparin  Code Status: Full code Disposition Plan: admit   Time spent: 70 mins   [facility] Pager [phone]  If 7PM-7AM, please contact night-coverage [email or web address] Password [facility] 04/28/2014, 5:59 PM

## 2014-04-29 ENCOUNTER — Observation Stay (HOSPITAL_COMMUNITY): Payer: Medicare Other

## 2014-04-29 ENCOUNTER — Encounter (HOSPITAL_COMMUNITY): Payer: Medicare Other

## 2014-04-29 DIAGNOSIS — I739 Peripheral vascular disease, unspecified: Secondary | ICD-10-CM

## 2014-04-29 DIAGNOSIS — R079 Chest pain, unspecified: Secondary | ICD-10-CM

## 2014-04-29 DIAGNOSIS — I10 Essential (primary) hypertension: Secondary | ICD-10-CM | POA: Diagnosis not present

## 2014-04-29 DIAGNOSIS — E119 Type 2 diabetes mellitus without complications: Secondary | ICD-10-CM | POA: Diagnosis not present

## 2014-04-29 DIAGNOSIS — N951 Menopausal and female climacteric states: Secondary | ICD-10-CM | POA: Diagnosis not present

## 2014-04-29 DIAGNOSIS — J449 Chronic obstructive pulmonary disease, unspecified: Secondary | ICD-10-CM | POA: Diagnosis not present

## 2014-04-29 DIAGNOSIS — R0789 Other chest pain: Secondary | ICD-10-CM | POA: Diagnosis not present

## 2014-04-29 DIAGNOSIS — R0602 Shortness of breath: Secondary | ICD-10-CM | POA: Diagnosis not present

## 2014-04-29 LAB — COMPREHENSIVE METABOLIC PANEL
ALT: 13 U/L (ref 0–35)
AST: 17 U/L (ref 0–37)
Albumin: 3.3 g/dL — ABNORMAL LOW (ref 3.5–5.2)
Alkaline Phosphatase: 58 U/L (ref 39–117)
Anion gap: 4 — ABNORMAL LOW (ref 5–15)
BUN: 16 mg/dL (ref 6–23)
CO2: 24 mmol/L (ref 19–32)
Calcium: 9.4 mg/dL (ref 8.4–10.5)
Chloride: 112 mmol/L (ref 96–112)
Creatinine, Ser: 0.88 mg/dL (ref 0.50–1.10)
GFR calc Af Amer: 68 mL/min — ABNORMAL LOW (ref >90)
GFR calc non Af Amer: 58 mL/min — ABNORMAL LOW (ref >90)
Glucose, Bld: 102 mg/dL — ABNORMAL HIGH (ref 70–99)
Potassium: 4.8 mmol/L (ref 3.5–5.1)
Sodium: 140 mmol/L (ref 135–145)
Total Bilirubin: 0.5 mg/dL (ref 0.3–1.2)
Total Protein: 5.9 g/dL — ABNORMAL LOW (ref 6.0–8.3)

## 2014-04-29 LAB — CBC
HCT: 33.8 % — ABNORMAL LOW (ref 36.0–46.0)
Hemoglobin: 11 g/dL — ABNORMAL LOW (ref 12.0–15.0)
MCH: 25.3 pg — ABNORMAL LOW (ref 26.0–34.0)
MCHC: 32.5 g/dL (ref 30.0–36.0)
MCV: 77.7 fL — ABNORMAL LOW (ref 78.0–100.0)
Platelets: 103 10*3/uL — ABNORMAL LOW (ref 150–400)
RBC: 4.35 MIL/uL (ref 3.87–5.11)
RDW: 16.9 % — ABNORMAL HIGH (ref 11.5–15.5)
WBC: 5.2 10*3/uL (ref 4.0–10.5)

## 2014-04-29 LAB — GLUCOSE, CAPILLARY
Glucose-Capillary: 111 mg/dL — ABNORMAL HIGH (ref 70–99)
Glucose-Capillary: 155 mg/dL — ABNORMAL HIGH (ref 70–99)
Glucose-Capillary: 80 mg/dL (ref 70–99)

## 2014-04-29 LAB — TROPONIN I: Troponin I: <0.03 ng/mL (ref <0.031)

## 2014-04-29 MED ORDER — LEVALBUTEROL HCL 0.63 MG/3ML IN NEBU
0.6300 mg | INHALATION_SOLUTION | Freq: Two times a day (BID) | RESPIRATORY_TRACT | Status: DC
  Filled 2014-04-29: qty 3

## 2014-04-29 MED ORDER — REGADENOSON 0.4 MG/5ML IV SOLN
INTRAVENOUS | Status: AC
  Administered 2014-04-29: 0.4 mg via INTRAVENOUS
  Filled 2014-04-29: qty 5

## 2014-04-29 MED ORDER — TECHNETIUM TC 99M SESTAMIBI GENERIC - CARDIOLITE
30.0000 | Freq: Once | INTRAVENOUS | Status: AC | PRN
  Administered 2014-04-29: 30 via INTRAVENOUS

## 2014-04-29 MED ORDER — IPRATROPIUM BROMIDE 0.02 % IN SOLN
0.5000 mg | Freq: Two times a day (BID) | RESPIRATORY_TRACT | Status: DC
  Filled 2014-04-29: qty 2.5

## 2014-04-29 MED ORDER — TECHNETIUM TC 99M SESTAMIBI GENERIC - CARDIOLITE
10.0000 | Freq: Once | INTRAVENOUS | Status: AC | PRN
  Administered 2014-04-29: 10 via INTRAVENOUS

## 2014-04-29 MED ORDER — LEVALBUTEROL HCL 0.63 MG/3ML IN NEBU: 0.6300 mg | INHALATION_SOLUTION | Freq: Four times a day (QID) | RESPIRATORY_TRACT | Status: DC | PRN

## 2014-04-29 MED ORDER — LEVALBUTEROL HCL 0.63 MG/3ML IN NEBU: 0.6300 mg | INHALATION_SOLUTION | Freq: Two times a day (BID) | RESPIRATORY_TRACT | Status: DC

## 2014-04-29 MED ORDER — IPRATROPIUM BROMIDE 0.02 % IN SOLN: 0.5000 mg | Freq: Two times a day (BID) | RESPIRATORY_TRACT | Status: DC

## 2014-04-29 NOTE — Progress Notes (Signed)
Cardiology PA  Kerin Ransom, [place] contacted to clear pt for discharge per instructions of  Dr Erlinda Hong.  PA states stress test negative and pt okay to d/c home.

## 2014-04-29 NOTE — Progress Notes (Signed)
Pt d/c instructions reviewed with pt and family member at her side. Copy of instructions given to pt. Pt d/c'd via wheelchair with belongings with family, escorted by unit NT.

## 2014-04-29 NOTE — Progress Notes (Signed)
Dr Erlinda Hong wants doppler studies done as an outpatient. She instructs to contact the department and request they change her vascular dopplers for upper and lower extremities as an outpatient. Department is after hours, a message will be left on the departments machine stating what Dr Erlinda Hong has instructed to be done.

## 2014-04-29 NOTE — Consult Note (Signed)
CONSULT NOTE  Date: 04/29/2014               Patient Name:  Leah Wiggins MRN: [id number]  DOB: 1926/07/11 Age / Sex: 81 y.o., female        PCP: Maximino Greenland Primary Cardiologist: Gwenlyn Found            Referring Physician: Erlinda Hong              Reason for Consult: Chest pain            History of Present Illness: Patient is a 81 y.o. female with a PMHx of hypertension, hyperlipidemia, diabetes mellitus, peripheral vascular disease, COPD, ongoing cigarette smoking, who was admitted to [facility] on 04/28/2014 for evaluation of chest discomfort.  The pain is not pleuritic. She has pain with rest or with activity. It does not seem to be related to exertion. It's also not related to eating or drinking. She denies any fevers or chills.   The pain is described as an ache or a tightness in her chest. It is center and slightly to the left of center. She's been having these pains for about 2 months. The pains last for about 3-5 minutes each time. She denies any increased shortness of breath but she does have chronic lung disease and so she's chronically short of breath.    Medications: Outpatient medications: Prescriptions prior to admission  Medication Sig Dispense Refill Last Dose  . alendronate (FOSAMAX) 70 MG tablet Take 70 mg by mouth every 7 (seven) days. Take with a full glass of water on an empty stomach. Wednesdays   Past Week at Unknown time  . aspirin EC 81 MG tablet Take 81 mg by mouth daily.   04/28/2014 at Unknown time  . metFORMIN (GLUCOPHAGE-XR) 500 MG 24 hr tablet Take 500 mg by mouth 2 (two) times daily.   04/27/2014 at Unknown time  . pravastatin (PRAVACHOL) 40 MG tablet Take 40 mg by mouth daily.   04/28/2014 at Unknown time  . SPIRIVA HANDIHALER 18 MCG inhalation capsule Place 18 mcg into inhaler and inhale daily as needed (shortness of breath).    04/27/2014 at Unknown time  . valsartan (DIOVAN) 160 MG tablet Take 160 mg by mouth daily.     04/27/2014 at Unknown time  .  Cholecalciferol (VITAMIN D3) 50000 UNITS CAPS Take 1 capsule by mouth once a week.   Not Taking at Unknown time  . HYDROcodone-acetaminophen (NORCO) 5-325 MG per tablet Take 1 tablet by mouth every 4 (four) hours as needed for moderate pain. 20 tablet 0 Past Month at Unknown time  . nicotine (NICODERM CQ - DOSED IN MG/24 HOURS) 21 mg/24hr patch Place 1 patch (21 mg total) onto the skin daily. 28 patch 0   . pantoprazole (PROTONIX) 40 MG tablet Take 1 tablet (40 mg total) by mouth daily at 12 noon. 30 tablet 0   . rosuvastatin (CRESTOR) 40 MG tablet Take 40 mg by mouth daily.   Past Week at Unknown time    Current medications: Current Facility-Administered Medications  Medication Dose Route Frequency Provider Last Rate Last Dose  . 0.9 %  sodium chloride infusion   Intravenous Continuous Reyne Dumas, MD 50 mL/hr at 04/28/14 1813    . acetaminophen (TYLENOL) tablet 650 mg  650 mg Oral Q6H PRN Reyne Dumas, MD       Or  . acetaminophen (TYLENOL) suppository 650 mg  650 mg Rectal Q6H PRN Reyne Dumas, MD      .  aspirin EC tablet 81 mg  81 mg Oral Daily Reyne Dumas, MD      . enoxaparin (LOVENOX) injection 40 mg  40 mg Subcutaneous Q24H Reyne Dumas, MD   40 mg at 04/28/14 1812  . insulin aspart (novoLOG) injection 0-9 Units  0-9 Units Subcutaneous TID WC Nayana Abrol, MD      . ipratropium (ATROVENT) nebulizer solution 0.5 mg  0.5 mg Nebulization BID Reyne Dumas, MD      . irbesartan (AVAPRO) tablet 37.5 mg  37.5 mg Oral Daily Reyne Dumas, MD      . levalbuterol (XOPENEX) nebulizer solution 0.63 mg  0.63 mg Nebulization Q6H PRN Reyne Dumas, MD      . levalbuterol (XOPENEX) nebulizer solution 0.63 mg  0.63 mg Nebulization BID Reyne Dumas, MD      . nicotine (NICODERM CQ - dosed in mg/24 hours) patch 21 mg  21 mg Transdermal Daily Reyne Dumas, MD   21 mg at 04/28/14 2305  . ondansetron (ZOFRAN) tablet 4 mg  4 mg Oral Q6H PRN Reyne Dumas, MD       Or  . ondansetron (ZOFRAN) injection 4 mg  4  mg Intravenous Q6H PRN Reyne Dumas, MD      . pantoprazole (PROTONIX) EC tablet 40 mg  40 mg Oral Q1200 Reyne Dumas, MD      . potassium chloride (K-DUR,KLOR-CON) CR tablet 30 mEq  30 mEq Oral TID WC Rhetta Mura Schorr, NP      . regadenoson (LEXISCAN) injection SOLN 0.4 mg  0.4 mg Intravenous Once Reyne Dumas, MD      . rosuvastatin (CRESTOR) tablet 40 mg  40 mg Oral Daily Nayana Abrol, MD      . sodium chloride 0.9 % injection 3 mL  3 mL Intravenous Q12H Reyne Dumas, MD   3 mL at 04/28/14 2305  . tiotropium (SPIRIVA) inhalation capsule 18 mcg  18 mcg Inhalation Daily PRN Reyne Dumas, MD         Allergies  Allergen Reactions  . Penicillins Other (See Comments)    unknown     Past Medical History  Diagnosis Date  . Breast cancer 1991  . Diabetes mellitus   . Non-stress test nonreactive, 04/24/12, normal 12/10/2012  . Tobacco use 12/10/2012  . PVD (peripheral vascular disease)   . Hypertension     Past Surgical History  Procedure Laterality Date  . Mastectomy partial / lumpectomy w/ axillary lymphadenectomy Right 1991   . Total hip arthroplasty Right 2011  . Lower extremity arterial doppler Bilateral 05/07/2012    Left ABI-demonstrated mild arterial insufficiency. Right CIA 50-69% diameter reduction. Right SFA less than 50% diameter reduction. Left SFA 70-99% diameter reduction. Bilateral Runoff-Posterior tibial arteries appeared occluded.  . Cardiovascular stress test  04/24/2012    No significant ST segment change suggestive of ischemia.    Family History  Problem Relation Age of Onset  . Heart disease Mother   . Stroke Mother   . Stroke Father   . Cancer Sister   . Cancer Brother     Social History:  reports that she has been smoking Cigarettes.  She started smoking about 67 years ago. She has a 15 pack-year smoking history. She does not have any smokeless tobacco history on file. She reports that she does not drink alcohol or use illicit drugs.   Review of  Systems: Constitutional:  Denies  fever, chills, diaphoresis, appetite change and fatigue.  HEENT: Denies  photophobia, eye pain, redness, hearing  loss, ear pain, congestion, sore throat, rhinorrhea, sneezing, neck pain, neck stiffness and tinnitus.  Respiratory: Is chronically SOB, DOE,  Has occasional cough, chest tightness, and wheezing.  Cardiovascular: Admits to having chest pain, denies  palpitations and leg swelling.  Gastrointestinal: Denies  nausea, vomiting, abdominal pain, diarrhea, constipation, blood in stool.  Genitourinary: Denies  dysuria, urgency, frequency, hematuria, flank pain and difficulty urinating.  Musculoskeletal: Denies    myalgias, back pain, joint swelling, arthralgias and gait problem.   Skin: Denies pallor, rash and wound.  Neurological: Denies  dizziness, seizures, syncope, weakness, light-headedness, numbness and headaches.   Hematological: Denies  adenopathy, easy bruising, personal or family bleeding history.  Psychiatric/ Behavioral: Denies  suicidal ideation, mood changes, confusion, nervousness, sleep disturbance and agitation.    Physical Exam: BP 148/55 mmHg  Pulse 78  Temp(Src) 97.7 F (36.5 C) (Oral)  Resp 16  Ht 5\' 3"  (1.6 m)  Wt 161 lb 8 oz (73.256 kg)  BMI 28.62 kg/m2  SpO2 98%  LMP   Wt Readings from Last 3 Encounters:  04/29/14 161 lb 8 oz (73.256 kg)  03/04/14 171 lb 4.8 oz (77.701 kg)  10/07/13 176 lb (79.833 kg)    General: Vital signs reviewed and noted. Well-developed, well-nourished, in no acute distress; alert,   Head: Normocephalic, atraumatic, sclera anicteric,   Neck: Supple. Negative for carotid bruits. No JVD   Lungs:  Clear bilaterally, no  wheezes, rales, or rhonchi. Breathing is normal   Heart: RRR with S1 S2. No murmurs, rubs, or gallops , right mastectomy  Abdomen/ GI :  Soft, non-tender, non-distended with normoactive bowel sounds. No hepatomegaly. No rebound/guarding. No obvious abdominal masses   MSK: Strength  and the appear normal for age.   Extremities: No clubbing or cyanosis. No edema.  Distal pedal pulses are 2+ and equal   Neurologic:  CN are grossly intact,  No obvious motor or sensory defect.  Alert and oriented X 3. Moves all extremities spontaneously.  Psych: Responds to questions appropriately with a normal affect.   Skin:  No rashes.   Lab results: Basic Metabolic Panel:  Recent Labs Lab 04/28/14 1449 04/28/14 1925  NA 145  --   K 2.7*  --   CL 123*  --   CO2 19  --   GLUCOSE 71  --   BUN 12  --   CREATININE 0.46*  --   CALCIUM 6.3*  --   MG  --  1.7    Liver Function Tests:  Recent Labs Lab 04/28/14 1449  AST 12  ALT 11  ALKPHOS 39  BILITOT 0.3  PROT 3.8*  ALBUMIN 2.0*   No results for input(s): LIPASE, AMYLASE in the last 168 hours. No results for input(s): AMMONIA in the last 168 hours.  CBC:  Recent Labs Lab 04/28/14 1640 04/29/14 0700  WBC 6.0 5.2  NEUTROABS 3.3  --   HGB 12.1 11.0*  HCT 36.9 33.8*  MCV 77.4* 77.7*  PLT 97* 103*    Cardiac Enzymes:  Recent Labs Lab 04/28/14 1449 04/28/14 1925 04/28/14 2112  TROPONINI <0.03 <0.03 <0.03    BNP: Invalid input(s): POCBNP  CBG:  Recent Labs Lab 04/28/14 1837 04/28/14 2050 04/29/14 0741  GLUCAP 134* 130* 111*    Coagulation Studies: No results for input(s): LABPROT, INR in the last 72 hours.   Other results:  Personal review of EKG shows :  - sinus brady at 56.  No ST or T wave changes  Imaging: Dg Chest 2 View  04/28/2014   CLINICAL DATA:  81 year old female with history of syncope and few for the past 2 days.  EXAM: CHEST  2 VIEW  COMPARISON:  Chest x-ray 03/03/2014.  FINDINGS: Mild diffuse peribronchial cuffing. No acute consolidative airspace disease. No pleural effusions. No evidence of pulmonary edema. Heart size appears borderline enlarged, accentuated by portable AP technique. Upper mediastinal contours are within normal limits. Atherosclerosis in the thoracic  aorta. Extensive degenerative changes in the left glenohumeral joint again noted.  IMPRESSION: 1. Diffuse peribronchial cuffing, concerning for acute bronchitis. 2. Atherosclerosis.   Electronically Signed   By: Vinnie Langton M.D.   On: 04/28/2014 15:49   Ct Angio Chest Pe W/cm &/or Wo Cm  04/28/2014   CLINICAL DATA:  Dull pain in left chest. History of breast cancer with right mastectomy.  EXAM: CT ANGIOGRAPHY CHEST WITH CONTRAST  TECHNIQUE: Multidetector CT imaging of the chest was performed using the standard protocol during bolus administration of intravenous contrast. Multiplanar CT image reconstructions and MIPs were obtained to evaluate the vascular anatomy.  CONTRAST:  130mL OMNIPAQUE IOHEXOL 350 MG/ML SOLN  COMPARISON:  Chest x-ray earlier today.  FINDINGS: Patient is status post right mastectomy. No filling defects in the pulmonary arteries to suggest pulmonary emboli. Coronary artery calcifications within the left anterior descending, circumflex and right coronary arteries. Heart is normal size. Aortic arch and descending aortic calcifications. No aneurysm.  No mediastinal, hilar, or axillary adenopathy. Calcified granuloma in the lingula with calcified left hilar and mediastinal lymph nodes. No confluent airspace opacities. No effusions.  Imaging into the upper abdomen demonstrates calcifications within the visualized pancreatic body and tail compatible with chronic pancreatitis.  No acute bony abnormality. Degenerative changes throughout the thoracic spine.  Review of the MIP images confirms the above findings.  IMPRESSION: No evidence of pulmonary embolus.  Scattered Coronary artery disease throughout the 3 major coronary vessels.  Old granulomatous disease.  Chronic pancreatitis changes.   Electronically Signed   By: Rolm Baptise M.D.   On: 04/28/2014 17:20      Echo 03/04/14 Left ventricle: The cavity size was normal. Wall thickness was increased in a pattern of moderate LVH. Systolic  function was normal. The estimated ejection fraction was in the range of 55% to 60%. Wall motion was normal; there were no regional wall motion abnormalities. Left ventricular diastolic function parameters were normal. - Pulmonary arteries: PA peak pressure: 33 mm Hg     Assessment & Plan:  1. Chest tightness: The patient presents with episodes of chest tightness. Although some of the features of this chest pain are atypical, she has numerous risk factors for coronary artery disease. She continues to smoke. She has known coronary calcifications and all 3 of her coronary distributions by CT scan.  I agree with the plan to perform a [place] study.  She definitely needs to stop smoking. She has known peripheral vascular disease.  We will get the results of this stress test today. Further workup depending on the results of the stress test.  2. Hypertension:  Continue current medications. We can titrate up medications as needed.  3. Hyperlipidemia:  She is on Pravachol at home and was changed to Crestor here. We could consider atorvastatin since it would be less expensive.  4. Diabetes mellitus: Plans per internal medicine team.     Ramond Dial., MD, [facility] 04/29/2014, 8:28 AM Office - [phone] Pager [phone]

## 2014-04-29 NOTE — Discharge Summary (Addendum)
Discharge Summary  Leah Wiggins:[id number] DOB: 04/07/1926  PCP: Gwynneth Aliment, MD  Admit date: 04/28/2014 Discharge date: 04/29/2014  Time spent: less than  Recommendations for Outpatient Follow-up:  1. F/u with pmd in 1wk, repeat bmp in 1wk. pmd to f/u on pth/vitd level result.  Discharge Diagnoses:  Active Hospital Problems   Diagnosis Date Noted  . Pain in the chest 04/28/2014  . Chest pain 03/03/2014  . Essential hypertension 06/24/2013  . PAD (peripheral artery disease), abnormal ABIs 12/10/2012    Resolved Hospital Problems   Diagnosis Date Noted Date Resolved  No resolved problems to display.    Discharge Condition: stable, no pain  Diet recommendation: heart healthy/carb monidfied  Filed Weights   04/28/14 2053 04/29/14 0420  Weight: 74.208 kg (163 lb 9.6 oz) 73.256 kg (161 lb 8 oz)    History of present illness:  81 year old female with a history of diabetes, hypercholesterolemia, hypertension, and COPD who presents with a history since last night of intermittent episodes of hot flashes, diaphoresis, burping, left submammary chest pain, and lightheadedness, that come on with exertion as well as rest. These flashes travel throughout her whole body. She has some shortness of breath but states this is chronic and due to her COPD she continues to smoke half a pack a day since age 23. The pain is not pleuritic and is not exertional. Unrelated to position, activity, or meals. She denies any subjective fever or chills. She denies any cough, denies urinary urgency frequency or dysuria. 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 She denies any hip pain right now. She has swelling of both of her ankles and her right arm which is secondary to lymphedema from breast cancer surgery. She denies any extremity pain. There is no coughing or wheezing. She's had some lower  abdominal pain off-and-on for the past 2 months. She denies any chronic kidney disease, lupus, or systemic inflammatory state. ER workup showed sinus bradycardia, CTA with coronary calcifications, troponin negative 1  Hospital Course:  Active Problems:   PAD (peripheral artery disease), abnormal ABIs   Essential hypertension   Chest pain   Pain in the chest  Chest pain: Patient has atypical chest pain. Chest x-ray is negative for infiltration. CTA negative for pulmonary embolism but shows scattered coronary artery calcifications - Aspirin, nitroglycerin when necessary, Crestor -Troponin 3 negative -2D echo done during last admission reviewed, EF of 50-60%, wall motion was normal -Given multiple risk factors, stress test done on 2/23, unremarkable, please refer to original report.  Abnormal d-dimer History of breast cancer, right upper extremity swelling  venous Doppler of bilateral upper and lower extremities CTA no PE PMD to f/u on venous US of the upper and lower extremities.   Hypokalemia/hypocalcemia Repleted potassium Repleted calcium TSH wnl / PTH, vitamin D levels result pending PMD to f/u on result and repeat bmp in 1wk.  DM-II:  Hemoglobin A1c 6.8 in December 2015 resule metformin at discharge Repeat  A1c pending  Tobacco abuse: Patient smokes half pack a day for more than 60 years. -Continue spirava inhaler -Nicotine patch Lung clear on exma  HTN: Continue Irbesartan  Mild right side ab pain, no guarding, no rebound, cmp wnl. Denies change of bowel habit, pmd to continue monitor.  Procedures:  CTA chest/Stress test  Consultations:  cardiology  Discharge Exam: BP 118/58 mmHg  Pulse 75  Temp(Src) 98.3  F (36.8 C) (Oral)  Resp 18  Ht 5\' 3"  (1.6 m)  Wt 73.256 kg (161 lb 8 oz)  BMI 28.62 kg/m2  SpO2 99%  LMP   Constitutional: Vital signs reviewed. Patient is a well-developed and well-nourished in no acute distress and cooperative with exam.  Alert and oriented x3.  Head: Normocephalic and atraumatic  Ear: TM normal bilaterally  Mouth: no erythema or exudates, MMM  Eyes: PERRL, EOMI, conjunctivae normal, No scleral icterus.  Neck: Supple, Trachea midline normal ROM, No JVD, mass, thyromegaly, or carotid bruit present.  Cardiovascular: RRR, S1 normal, S2 normal, no MRG, pulses symmetric and intact bilaterally  Pulmonary/Chest: CTAB, no wheezes, rales, or rhonchi  Abd: Soft, nondistended, mild tenderness over RUQ, no rebound pain, no organomegaly, BS present.  GU: no CVA tenderness Musculoskeletal: No joint deformities, erythema, or stiffness, ROM full and no nontender Ext: no edema and no cyanosis, pulses palpable bilaterally (DP and PT)  Hematology: no cervical, inginal, or axillary adenopathy.  Neurological: A&O x3, Strenght is normal and symmetric bilaterally, cranial nerve II-XII are grossly intact, no focal motor deficit, sensory intact to light touch bilaterally.  Skin: Warm, dry and intact. No rash, cyanosis, or clubbing.  Psychiatric: Normal mood and affect. speech and behavior is normal. Judgment and thought content normal. Cognition and memory are normal.    Discharge Instructions You were cared for by a hospitalist during your hospital stay. If you have any questions about your discharge medications or the care you received while you were in the hospital after you are discharged, you can call the unit and asked to speak with the hospitalist on call if the hospitalist that took care of you is not available. Once you are discharged, your primary care physician will handle any further medical issues. Please note that NO REFILLS for any discharge medications will be authorized once you are discharged, as it is imperative that you return to your primary care physician (or establish a relationship with a primary care physician if you do not have one) for your aftercare needs so that they can reassess your need for  medications and monitor your lab values.      Discharge Instructions    Diet - low sodium heart healthy    Complete by:  As directed      Increase activity slowly    Complete by:  As directed             Medication List    STOP taking these medications        pravastatin 40 MG tablet  Commonly known as:  PRAVACHOL      TAKE these medications        alendronate 70 MG tablet  Commonly known as:  FOSAMAX  Take 70 mg by mouth every 7 (seven) days. Take with a full glass of water on an empty stomach. Wednesdays     aspirin EC 81 MG tablet  Take 81 mg by mouth daily.     HYDROcodone-acetaminophen 5-325 MG per tablet  Commonly known as:  NORCO  Take 1 tablet by mouth every 4 (four) hours as needed for moderate pain.     metFORMIN 500 MG 24 hr tablet  Commonly known as:  GLUCOPHAGE-XR  Take 500 mg by mouth 2 (two) times daily.     nicotine 21 mg/24hr patch  Commonly known as:  NICODERM CQ - dosed in mg/24 hours  Place 1 patch (21 mg total) onto the skin daily.  pantoprazole 40 MG tablet  Commonly known as:  PROTONIX  Take 1 tablet (40 mg total) by mouth daily at 12 noon.     rosuvastatin 40 MG tablet  Commonly known as:  CRESTOR  Take 40 mg by mouth daily.     SPIRIVA HANDIHALER 18 MCG inhalation capsule  Generic drug:  tiotropium  Place 18 mcg into inhaler and inhale daily as needed (shortness of breath).     valsartan 160 MG tablet  Commonly known as:  DIOVAN  Take 160 mg by mouth daily.     Vitamin D3 50000 UNITS Caps  Take 1 capsule by mouth once a week.       Allergies  Allergen Reactions  . Penicillins Other (See Comments)    unknown   Follow-up Information    Follow up with Gwynneth Aliment, MD In 1 week.   Specialty:  Internal Medicine   Contact information:   [address] [place] [address] [phone]        The results of significant diagnostics from this hospitalization (including imaging, microbiology, ancillary  and laboratory) are listed below for reference.    Significant Diagnostic Studies: Dg Chest 2 View  04/28/2014   CLINICAL DATA:  81 year old female with history of syncope and few for the past 2 days.  EXAM: CHEST  2 VIEW  COMPARISON:  Chest x-ray 03/03/2014.  FINDINGS: Mild diffuse peribronchial cuffing. No acute consolidative airspace disease. No pleural effusions. No evidence of pulmonary edema. Heart size appears borderline enlarged, accentuated by portable AP technique. Upper mediastinal contours are within normal limits. Atherosclerosis in the thoracic aorta. Extensive degenerative changes in the left glenohumeral joint again noted.  IMPRESSION: 1. Diffuse peribronchial cuffing, concerning for acute bronchitis. 2. Atherosclerosis.   Electronically Signed   By: Trudie Reed M.D.   On: 04/28/2014 15:49   Ct Angio Chest Pe W/cm &/or Wo Cm  04/28/2014   CLINICAL DATA:  Dull pain in left chest. History of breast cancer with right mastectomy.  EXAM: CT ANGIOGRAPHY CHEST WITH CONTRAST  TECHNIQUE: Multidetector CT imaging of the chest was performed using the standard protocol during bolus administration of intravenous contrast. Multiplanar CT image reconstructions and MIPs were obtained to evaluate the vascular anatomy.  CONTRAST:  OMNIPAQUE IOHEXOL 350 MG/ML SOLN  COMPARISON:  Chest x-ray earlier today.  FINDINGS: Patient is status post right mastectomy. No filling defects in the pulmonary arteries to suggest pulmonary emboli. Coronary artery calcifications within the left anterior descending, circumflex and right coronary arteries. Heart is normal size. Aortic arch and descending aortic calcifications. No aneurysm.  No mediastinal, hilar, or axillary adenopathy. Calcified granuloma in the lingula with calcified left hilar and mediastinal lymph nodes. No confluent airspace opacities. No effusions.  Imaging into the upper abdomen demonstrates calcifications within the visualized pancreatic body and  tail compatible with chronic pancreatitis.  No acute bony abnormality. Degenerative changes throughout the thoracic spine.  Review of the MIP images confirms the above findings.  IMPRESSION: No evidence of pulmonary embolus.  Scattered Coronary artery disease throughout the 3 major coronary vessels.  Old granulomatous disease.  Chronic pancreatitis changes.   Electronically Signed   By: Charlett Nose M.D.   On: 04/28/2014 17:20   Nm Myocar Multi W/spect W/wall Motion / Ef  04/29/2014   CLINICAL DATA:  81 year old hypertension, hyperlipidemia, diabetes mellitus, peripheral vascular disease, COPD, ongoing cigarette smoking, who was admitted to [facility] on 04/28/2014 for evaluation of chest discomfort.  The  pain is not pleuritic. She has pain with rest or with activity. It does not seem to be related to exertion. It's also not related to eating or drinking. She denies any fevers or chills.  The pain is described as an ache or a tightness in her chest. It is center and slightly to the left of center. She's been having these pains for about 2 months. The pains last for about 3-5 minutes each time. She denies any increased shortness of breath but she does have chronic lung disease and so she's chronically short of breath.  EXAM: MYOCARDIAL IMAGING WITH SPECT (REST AND PHARMACOLOGIC-STRESS)  GATED LEFT VENTRICULAR WALL MOTION STUDY  LEFT VENTRICULAR EJECTION FRACTION  TECHNIQUE: Standard myocardial SPECT imaging was performed after resting intravenous injection of 10 mCi Tc-36m sestamibi. Subsequently, intravenous infusion of Lexiscan was performed under the supervision of the Cardiology staff. At peak effect of the drug, 30 mCi Tc-10m sestamibi was injected intravenously and standard myocardial SPECT imaging was performed. Quantitative gated imaging was also performed to evaluate left ventricular wall motion, and estimate left ventricular ejection fraction.  COMPARISON:  None.  FINDINGS: Perfusion: No decreased activity in  the left ventricle on stress imaging to suggest reversible ischemia or infarction.  Wall Motion: Normal left ventricular wall motion. No left ventricular dilation.  Left Ventricular Ejection Fraction: 84 %  End diastolic volume 54 ml  End systolic volume 9 ml  IMPRESSION: 1. No reversible ischemia or infarction.  2. Normal left ventricular wall motion.  3. Left ventricular ejection fraction 84%  4. Low-risk stress test findings*.  *2012 Appropriate Use Criteria for Coronary Revascularization Focused Update: J Am Coll Cardiol. 2012;59(9):857-881. http://content.dementiazones.com.aspx?articleid=1201161   Electronically Signed   By: Donato Schultz   On: 04/29/2014 15:14    Microbiology: No results found for this or any previous visit (from the past 240 hour(s)).   Labs: Basic Metabolic Panel:  Recent Labs Lab 04/28/14 1449 04/28/14 1925 04/29/14 0700  NA 145  --  140  K 2.7*  --  4.8  CL 123*  --  112  CO2 19  --  24  GLUCOSE 71  --  102*  BUN 12  --  16  CREATININE 0.46*  --  0.88  CALCIUM 6.3*  --  9.4  MG  --  1.7  --    Liver Function Tests:  Recent Labs Lab 04/28/14 1449 04/29/14 0700  AST 12 17  ALT 11 13  ALKPHOS 39 58  BILITOT 0.3 0.5  PROT 3.8* 5.9*  ALBUMIN 2.0* 3.3*   No results for input(s): LIPASE, AMYLASE in the last 168 hours. No results for input(s): AMMONIA in the last 168 hours. CBC:  Recent Labs Lab 04/28/14 1640 04/29/14 0700  WBC 6.0 5.2  NEUTROABS 3.3  --   HGB 12.1 11.0*  HCT 36.9 33.8*  MCV 77.4* 77.7*  PLT 97* 103*   Cardiac Enzymes:  Recent Labs Lab 04/28/14 1449 04/28/14 1925 04/28/14 2112 04/29/14 0700  TROPONINI <0.03 <0.03 <0.03 <0.03   BNP: BNP (last 3 results)  Recent Labs  04/28/14 1449  BNP 21.3    ProBNP (last 3 results) No results for input(s): PROBNP in the last 8760 hours.  CBG:  Recent Labs Lab 04/28/14 1837 04/28/14 2050 04/29/14 0741 04/29/14 1156  GLUCAP 134* 130* 111* 155*        Signed:  Nehal Witting  [facility] 04/29/2014, 5:09 PM

## 2014-04-30 ENCOUNTER — Ambulatory Visit
Admission: RE | Admit: 2014-04-30 | Discharge: 2014-04-30 | Disposition: A | Discharge status: Home / Self Care | Payer: Medicare Other | Source: Ambulatory Visit | Attending: Otolaryngology | Admitting: Otolaryngology

## 2014-04-30 DIAGNOSIS — H903 Sensorineural hearing loss, bilateral: Secondary | ICD-10-CM | POA: Diagnosis not present

## 2014-04-30 LAB — HEMOGLOBIN A1C
Hgb A1c MFr Bld: 6.8 % — ABNORMAL HIGH (ref 4.8–5.6)
Mean Plasma Glucose: 148 mg/dL

## 2014-04-30 LAB — PTH, INTACT AND CALCIUM
Calcium, Total (PTH): 9.6 mg/dL (ref 8.7–10.3)
PTH: 9 pg/mL — ABNORMAL LOW (ref 15–65)

## 2014-04-30 LAB — VITAMIN D 25 HYDROXY (VIT D DEFICIENCY, FRACTURES): Vit D, 25-Hydroxy: 22.2 ng/mL — ABNORMAL LOW (ref 30.0–100.0)

## 2014-04-30 MED ORDER — GADOBENATE DIMEGLUMINE 529 MG/ML IV SOLN
15.0000 mL | Freq: Once | INTRAVENOUS | Status: AC | PRN
  Administered 2014-04-30: 15 mL via INTRAVENOUS

## 2014-05-01 DIAGNOSIS — N390 Urinary tract infection, site not specified: Secondary | ICD-10-CM | POA: Diagnosis not present

## 2014-05-19 DIAGNOSIS — N182 Chronic kidney disease, stage 2 (mild): Secondary | ICD-10-CM | POA: Diagnosis not present

## 2014-05-19 DIAGNOSIS — I129 Hypertensive chronic kidney disease with stage 1 through stage 4 chronic kidney disease, or unspecified chronic kidney disease: Secondary | ICD-10-CM | POA: Diagnosis not present

## 2014-05-19 DIAGNOSIS — N08 Glomerular disorders in diseases classified elsewhere: Secondary | ICD-10-CM | POA: Diagnosis not present

## 2014-05-19 DIAGNOSIS — E1122 Type 2 diabetes mellitus with diabetic chronic kidney disease: Secondary | ICD-10-CM | POA: Diagnosis not present

## 2014-05-30 DIAGNOSIS — R079 Chest pain, unspecified: Secondary | ICD-10-CM | POA: Diagnosis not present

## 2014-05-30 DIAGNOSIS — N182 Chronic kidney disease, stage 2 (mild): Secondary | ICD-10-CM | POA: Diagnosis not present

## 2014-05-30 DIAGNOSIS — N390 Urinary tract infection, site not specified: Secondary | ICD-10-CM | POA: Diagnosis not present

## 2014-05-30 DIAGNOSIS — Z1389 Encounter for screening for other disorder: Secondary | ICD-10-CM | POA: Diagnosis not present

## 2014-05-30 DIAGNOSIS — I129 Hypertensive chronic kidney disease with stage 1 through stage 4 chronic kidney disease, or unspecified chronic kidney disease: Secondary | ICD-10-CM | POA: Diagnosis not present

## 2014-06-02 DIAGNOSIS — H608X3 Other otitis externa, bilateral: Secondary | ICD-10-CM | POA: Diagnosis not present

## 2014-06-02 DIAGNOSIS — H903 Sensorineural hearing loss, bilateral: Secondary | ICD-10-CM | POA: Diagnosis not present

## 2014-06-16 ENCOUNTER — Ambulatory Visit (HOSPITAL_COMMUNITY)
Admission: RE | Admit: 2014-06-16 | Discharge: 2014-06-16 | Disposition: A | Discharge status: Home / Self Care | Payer: Medicare Other | Source: Ambulatory Visit | Attending: Cardiology | Admitting: Cardiology

## 2014-06-16 DIAGNOSIS — I70213 Atherosclerosis of native arteries of extremities with intermittent claudication, bilateral legs: Secondary | ICD-10-CM | POA: Insufficient documentation

## 2014-06-16 DIAGNOSIS — I739 Peripheral vascular disease, unspecified: Secondary | ICD-10-CM

## 2014-06-16 NOTE — Progress Notes (Signed)
Arterial Duplex Lower Ext. Completed. Leah Wiggins, BS, RDMS, RVT  

## 2014-06-18 ENCOUNTER — Telehealth: Payer: Self-pay | Admitting: Internal Medicine

## 2014-06-18 NOTE — Telephone Encounter (Signed)
Called,spoke with patient and she is aware of her new appointment with dr Julien Nordmann

## 2014-06-19 ENCOUNTER — Ambulatory Visit: Payer: Medicare Other | Admitting: Cardiovascular Disease

## 2014-06-24 ENCOUNTER — Ambulatory Visit (INDEPENDENT_AMBULATORY_CARE_PROVIDER_SITE_OTHER): Payer: Medicare Other | Admitting: Cardiovascular Disease

## 2014-06-24 ENCOUNTER — Encounter: Payer: Self-pay | Admitting: Cardiovascular Disease

## 2014-06-24 ENCOUNTER — Ambulatory Visit: Payer: Medicare Other | Admitting: Cardiovascular Disease

## 2014-06-24 VITALS — BP 142/64 | HR 68 | Ht 63.0 in | Wt 177.5 lb

## 2014-06-24 DIAGNOSIS — I10 Essential (primary) hypertension: Secondary | ICD-10-CM | POA: Diagnosis not present

## 2014-06-24 DIAGNOSIS — Z720 Tobacco use: Secondary | ICD-10-CM | POA: Diagnosis not present

## 2014-06-24 DIAGNOSIS — E785 Hyperlipidemia, unspecified: Secondary | ICD-10-CM

## 2014-06-24 DIAGNOSIS — I739 Peripheral vascular disease, unspecified: Secondary | ICD-10-CM

## 2014-06-24 DIAGNOSIS — R079 Chest pain, unspecified: Secondary | ICD-10-CM

## 2014-06-24 NOTE — Progress Notes (Signed)
06/24/2014 Leah Wiggins   08/09/1926  [id number]  Primary Physician Maximino Greenland, MD Primary Cardiologist: Lorretta Harp MD Renae Gloss   HPI:  The patient returns today for followup. She is an 81 year old mildly overweight widowed [place] American female, mother of 16, grandmother of greater than 48 grandchildren, who I saw 12 months ago. Her risk factors include recently discontinued tobacco abuse having smoked for many years and currently wearing a nicotine patch., contrary, hypertension, mild hyperlipidemia, and non-insulin-requiring diabetes. She also has peripheral vascular occlusive disease with Dopplers that show a right ABI of .64 with a high-frequency signal in the origin of the right common iliac artery I saw this progression of disease in the distal right SFA., left ABI of 0.84 with a high-frequency signal in the origin of the left SFA, though she really denies claudication. She had a negative Myoview performed 04/24/12 and more recently 04/29/14 for atypical chest pain.  Current Outpatient Prescriptions  Medication Sig Dispense Refill  . alendronate (FOSAMAX) 70 MG tablet Take 70 mg by mouth every 7 (seven) days. Take with a full glass of water on an empty stomach. Wednesdays    . aspirin EC 81 MG tablet Take 81 mg by mouth daily.    . Cholecalciferol (VITAMIN D3) 50000 UNITS CAPS Take 1 capsule by mouth once a week.    Marland Kitchen HYDROcodone-acetaminophen (NORCO) 5-325 MG per tablet Take 1 tablet by mouth every 4 (four) hours as needed for moderate pain. 20 tablet 0  . metFORMIN (GLUCOPHAGE-XR) 500 MG 24 hr tablet Take 500 mg by mouth 2 (two) times daily.    . mometasone (ELOCON) 0.1 % lotion Apply 1 application topically daily as needed.  0  . nicotine (NICODERM CQ - DOSED IN MG/24 HOURS) 21 mg/24hr patch Place 1 patch (21 mg total) onto the skin daily. 28 patch 0  . pantoprazole (PROTONIX) 40 MG tablet Take 1 tablet (40 mg total) by mouth daily at 12 noon. 30  tablet 0  . rosuvastatin (CRESTOR) 40 MG tablet Take 40 mg by mouth daily.    Marland Kitchen SPIRIVA HANDIHALER 18 MCG inhalation capsule Place 18 mcg into inhaler and inhale daily as needed (shortness of breath).     . valsartan (DIOVAN) 160 MG tablet Take 160 mg by mouth daily.       No current facility-administered medications for this visit.    Allergies  Allergen Reactions  . Penicillins Other (See Comments)    unknown    History   Social History  . Marital Status: Widowed    Spouse Name: N/A  . Number of Children: N/A  . Years of Education: N/A   Occupational History  . Not on file.   Social History Main Topics  . Smoking status: Current Some Day Smoker -- 0.25 packs/day for 60 years    Types: Cigarettes    Start date: 03/08/1947  . Smokeless tobacco: Not on file     Comment: using nicoderm patches to quit smoking  . Alcohol Use: No  . Drug Use: No  . Sexual Activity: Yes   Other Topics Concern  . Not on file   Social History Narrative     Review of Systems: General: negative for chills, fever, night sweats or weight changes.  Cardiovascular: negative for chest pain, dyspnea on exertion, edema, orthopnea, palpitations, paroxysmal nocturnal dyspnea or shortness of breath Dermatological: negative for rash Respiratory: negative for cough or wheezing Urologic: negative for hematuria Abdominal: negative for nausea, vomiting,  diarrhea, bright red blood per rectum, melena, or hematemesis Neurologic: negative for visual changes, syncope, or dizziness All other systems reviewed and are otherwise negative except as noted above.    Blood pressure 142/64, pulse 68, height '5\' 3"'$  (1.6 m), weight 177 lb 8 oz (80.513 kg).  General appearance: alert and no distress Neck: no adenopathy, no carotid bruit, no JVD, supple, symmetrical, trachea midline and thyroid not enlarged, symmetric, no tenderness/mass/nodules Lungs: clear to auscultation bilaterally Heart: regular rate and rhythm,  S1, S2 normal, no murmur, click, rub or gallop Extremities: extremities normal, atraumatic, no cyanosis or edema  EKG normal sinus rhythm at 68 without ST or T-wave changes. I personally reviewed this EKG  ASSESSMENT AND PLAN:   Tobacco use The patient is currently wearing a nicotine patch and is attempting to stop smoking   PAD (peripheral artery disease), abnormal ABIs History of PAD by duplex ultrasound with bilateral SFA and common iliac artery disease. Her right ABI decreased from 0.9 -.64 although she denies claudication.   Essential hypertension History of hypertension blood pressure measured at 142/64. She is on valsartan 160 mg a day. Continued current meds at current dosing   Dyslipidemia History of dyslipidemia on Crestor 40 mg a day followed by her PCP   Chest pain The patient continues to have infrequent atypical chest pain. She had a negative Myoview 04/24/12 and a more recent one performed 04/29/14.       Lorretta Harp MD FACP,FACC,FAHA, [facility] 06/24/2014 9:14 AM

## 2014-06-24 NOTE — Assessment & Plan Note (Signed)
The patient continues to have infrequent atypical chest pain. She had a negative Myoview 04/24/12 and a more recent one performed 04/29/14.

## 2014-06-24 NOTE — Assessment & Plan Note (Signed)
History of hypertension blood pressure measured at 142/64. She is on valsartan 160 mg a day. Continued current meds at current dosing

## 2014-06-24 NOTE — Assessment & Plan Note (Signed)
History of dyslipidemia on Crestor 40 mg a day followed by her PCP

## 2014-06-24 NOTE — Assessment & Plan Note (Signed)
History of PAD by duplex ultrasound with bilateral SFA and common iliac artery disease. Her right ABI decreased from 0.9 -.64 although she denies claudication.

## 2014-06-24 NOTE — Assessment & Plan Note (Signed)
The patient is currently wearing a nicotine patch and is attempting to stop smoking

## 2014-06-24 NOTE — Patient Instructions (Signed)
Dr Berry recommends that you schedule a follow-up appointment in 1 year. You will receive a reminder letter in the mail two months in advance. If you don't receive a letter, please call our office to schedule the follow-up appointment. 

## 2014-07-02 ENCOUNTER — Telehealth: Payer: Self-pay | Admitting: Internal Medicine

## 2014-07-02 NOTE — Telephone Encounter (Signed)
s.w. pt and advised on 6.6 appt moved to 6.9 due to MD out of the office.Marland KitchenMarland KitchenMarland KitchenMarland Kitchenpt ok and aware

## 2014-07-14 ENCOUNTER — Encounter: Payer: Self-pay | Admitting: Neurology

## 2014-07-14 ENCOUNTER — Ambulatory Visit (INDEPENDENT_AMBULATORY_CARE_PROVIDER_SITE_OTHER): Payer: Medicare Other | Admitting: Neurology

## 2014-07-14 VITALS — BP 138/48 | HR 74 | Resp 14 | Ht 63.0 in | Wt 176.0 lb

## 2014-07-14 DIAGNOSIS — E236 Other disorders of pituitary gland: Secondary | ICD-10-CM | POA: Insufficient documentation

## 2014-07-14 NOTE — Patient Instructions (Signed)
You do not have idiopathic intracranial hypertension.  The findings on the MRI of the brain are benign and often of no clinical significance.

## 2014-07-14 NOTE — Progress Notes (Signed)
NEUROLOGY CONSULTATION NOTE  Leah Wiggins MRN: [id number] DOB: 02-19-1927  Referring provider: Vicie Mutters Primary care provider: Glendale Chard  Reason for consult:  Evaluate for IIH  HISTORY OF PRESENT ILLNESS: Leah Wiggins is an 81 year-old right-handed woman with type 2 diabetes, hypertension, hyperlipidemia, tobacco abuse and history of breast cancer who presents for evaluation of idiopathic intracranial hypertension.  Records and MRI of brain reviewed.  She is accompanied by her daughter who provides some history.  She was evaluated by Dr. Thornell Mule, ENT, for progressive hearing loss.  She had an MRI of the brain with and without contrast which revealed no cause for hearing loss, but did show a partially empty sella with flattening of the pituitary gland.  She denies headache and pulsatile tinnitus.  She has some cloudy vision due to glaucoma.  She had 8 children, including two sets of twins.  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    PAST MEDICAL HISTORY: Past Medical History  Diagnosis Date  . Breast cancer 1991  . Diabetes mellitus   . Non-stress test nonreactive, 04/24/12, normal 12/10/2012  . Tobacco use 12/10/2012  . PVD (peripheral vascular disease)   . Hypertension   . Hyperlipidemia     PAST SURGICAL HISTORY: Past Surgical History  Procedure Laterality Date  . Mastectomy partial / lumpectomy w/ axillary lymphadenectomy Right 1991   . Total hip arthroplasty Right 2011  . Lower extremity arterial doppler Bilateral 05/07/2012    Left ABI-demonstrated mild arterial insufficiency. Right CIA 50-69% diameter reduction. Right SFA less than 50% diameter reduction. Left SFA 70-99% diameter reduction. Bilateral Runoff-Posterior tibial arteries appeared occluded.  . Cardiovascular stress test  04/24/2012    No significant ST segment change suggestive of ischemia.    MEDICATIONS: Current Outpatient  Prescriptions on File Prior to Visit  Medication Sig Dispense Refill  . alendronate (FOSAMAX) 70 MG tablet Take 70 mg by mouth every 7 (seven) days. Take with a full glass of water on an empty stomach. Wednesdays    . aspirin EC 81 MG tablet Take 81 mg by mouth daily.    . Cholecalciferol (VITAMIN D3) 50000 UNITS CAPS Take 1 capsule by mouth once a week.    Marland Kitchen HYDROcodone-acetaminophen (NORCO) 5-325 MG per tablet Take 1 tablet by mouth every 4 (four) hours as needed for moderate pain. 20 tablet 0  . metFORMIN (GLUCOPHAGE-XR) 500 MG 24 hr tablet Take 500 mg by mouth 2 (two) times daily.    . mometasone (ELOCON) 0.1 % lotion Apply 1 application topically daily as needed.  0  . nicotine (NICODERM CQ - DOSED IN MG/24 HOURS) 21 mg/24hr patch Place 1 patch (21 mg total) onto the skin daily. 28 patch 0  . pantoprazole (PROTONIX) 40 MG tablet Take 1 tablet (40 mg total) by mouth daily at 12 noon. 30 tablet 0  . rosuvastatin (CRESTOR) 40 MG tablet Take 40 mg by mouth daily.    Marland Kitchen SPIRIVA HANDIHALER 18 MCG inhalation capsule Place 18 mcg into inhaler and inhale daily as needed (shortness of breath).     . valsartan (DIOVAN) 160 MG tablet Take 160 mg by mouth daily.       No current facility-administered medications on file prior to visit.    ALLERGIES: Allergies  Allergen Reactions  . Penicillins Other (See Comments)    unknown    FAMILY HISTORY: Family History  Problem Relation Age of Onset  .  Heart disease Mother   . Stroke Mother   . Stroke Father   . Cancer Sister     GIST  . Cancer Brother     thyroid   . Diabetes Son   . Stroke Son     SOCIAL HISTORY: History   Social History  . Marital Status: Widowed    Spouse Name: N/A  . Number of Children: N/A  . Years of Education: N/A   Occupational History  . Not on file.   Social History Main Topics  . Smoking status: Current Some Day Smoker -- 0.25 packs/day for 60 years    Types: Cigarettes    Start date: 03/08/1947  .  Smokeless tobacco: Never Used     Comment: using nicoderm patches to quit smoking  . Alcohol Use: No  . Drug Use: No  . Sexual Activity: No   Other Topics Concern  . Not on file   Social History Narrative    REVIEW OF SYSTEMS: Constitutional: No fevers, chills, or sweats, no generalized fatigue, change in appetite Eyes: No visual changes, double vision, eye pain Ear, nose and throat: No hearing loss, ear pain, nasal congestion, sore throat Cardiovascular: No chest pain, palpitations Respiratory:  No shortness of breath at rest or with exertion, wheezes GastrointestinaI: No nausea, vomiting, diarrhea, abdominal pain, fecal incontinence Genitourinary:  No dysuria, urinary retention or frequency Musculoskeletal:  No neck pain, back pain Integumentary: No rash, pruritus, skin lesions Neurological: as above Psychiatric: No depression, insomnia, anxiety Endocrine: No palpitations, fatigue, diaphoresis, mood swings, change in appetite, change in weight, increased thirst Hematologic/Lymphatic:  No anemia, purpura, petechiae.  Mild lymphedema in right upper extremity Allergic/Immunologic: no itchy/runny eyes, nasal congestion, recent allergic reactions, rashes  PHYSICAL EXAM: Filed Vitals:   07/14/14 0957  BP: 138/48  Pulse: 74  Resp: 14   General: No acute distress Head:  Normocephalic/atraumatic Eyes:  fundi not visualized on inspection Neck: supple, no paraspinal tenderness, full range of motion Back: No paraspinal tenderness Heart: regular rate and rhythm Lungs: Clear to auscultation bilaterally. Vascular: No carotid bruits. Neurological Exam: Mental status: alert and oriented to person, place, and time, recent and remote memory intact, fund of knowledge intact, attention and concentration intact, speech fluent and not dysarthric, language intact. Cranial nerves: CN I: not tested CN II: pupils equal, round and reactive to light, visual fields intact, fundi unremarkable,  without vessel changes, exudates, hemorrhages or papilledema. CN III, IV, VI:  full range of motion, no nystagmus, no ptosis CN V: facial sensation intact CN VII: upper and lower face symmetric CN VIII: hearing intact CN IX, X: gag intact, uvula midline CN XI: sternocleidomastoid and trapezius muscles intact CN XII: tongue midline Bulk & Tone: normal, no fasciculations. Motor:  5/5 throughout Sensation:  Temperature and vibration intact Deep Tendon Reflexes:  2+ throughout, toes downgoing Finger to nose testing:  No dysmetria Heel to shin:  No dysmetria Gait:  Antalgic gait.  Unable to tandem walk. Romberg negative.  IMPRESSION: Partial empty sella sign on brain MRI.  I don't think she has idiopathic intracranial hypertension.  I cannot appreciate her fundi on exam, however she does not exhibit any symptoms of IIH.  This MRI finding is often benign and of no clinical significance.  PLAN: No further workup. Smoking cessation  Thank you for allowing me to take part in the care of this patient.  Metta Clines, DO  CC:  Vicie Mutters  Glendale Chard

## 2014-07-21 ENCOUNTER — Other Ambulatory Visit: Payer: Medicare Other

## 2014-07-21 ENCOUNTER — Ambulatory Visit: Payer: Medicare Other

## 2014-08-11 ENCOUNTER — Ambulatory Visit: Payer: Medicare Other | Admitting: Internal Medicine

## 2014-08-11 ENCOUNTER — Other Ambulatory Visit: Payer: Medicare Other

## 2014-08-12 ENCOUNTER — Telehealth: Payer: Self-pay | Admitting: Internal Medicine

## 2014-08-12 NOTE — Telephone Encounter (Signed)
returned call and pt decided to keep appt

## 2014-08-14 ENCOUNTER — Other Ambulatory Visit: Payer: Self-pay | Admitting: *Deleted

## 2014-08-14 ENCOUNTER — Other Ambulatory Visit (HOSPITAL_BASED_OUTPATIENT_CLINIC_OR_DEPARTMENT_OTHER): Payer: Medicare Other

## 2014-08-14 ENCOUNTER — Ambulatory Visit (HOSPITAL_BASED_OUTPATIENT_CLINIC_OR_DEPARTMENT_OTHER): Payer: Medicare Other | Admitting: Internal Medicine

## 2014-08-14 ENCOUNTER — Encounter: Payer: Self-pay | Admitting: Internal Medicine

## 2014-08-14 VITALS — BP 158/80 | HR 77 | Temp 98.2°F | Resp 18 | Ht 63.0 in | Wt 175.1 lb

## 2014-08-14 DIAGNOSIS — Z853 Personal history of malignant neoplasm of breast: Secondary | ICD-10-CM | POA: Diagnosis not present

## 2014-08-14 DIAGNOSIS — D696 Thrombocytopenia, unspecified: Secondary | ICD-10-CM

## 2014-08-14 LAB — CBC WITH DIFFERENTIAL/PLATELET
BASO%: 0.7 % (ref 0.0–2.0)
Basophils Absolute: 0 10*3/uL (ref 0.0–0.1)
EOS%: 2.4 % (ref 0.0–7.0)
Eosinophils Absolute: 0.1 10*3/uL (ref 0.0–0.5)
HCT: 38.5 % (ref 34.8–46.6)
HGB: 12.8 g/dL (ref 11.6–15.9)
LYMPH%: 34.5 % (ref 14.0–49.7)
MCH: 25.8 pg (ref 25.1–34.0)
MCHC: 33.2 g/dL (ref 31.5–36.0)
MCV: 77.5 fL — ABNORMAL LOW (ref 79.5–101.0)
MONO#: 0.5 10*3/uL (ref 0.1–0.9)
MONO%: 8.2 % (ref 0.0–14.0)
NEUT#: 3 10*3/uL (ref 1.5–6.5)
NEUT%: 54.2 % (ref 38.4–76.8)
Platelets: 93 10*3/uL — ABNORMAL LOW (ref 145–400)
RBC: 4.97 10*6/uL (ref 3.70–5.45)
RDW: 16.5 % — ABNORMAL HIGH (ref 11.2–14.5)
WBC: 5.5 10*3/uL (ref 3.9–10.3)
lymph#: 1.9 10*3/uL (ref 0.9–3.3)
nRBC: 0 % (ref 0–0)

## 2014-08-14 LAB — COMPREHENSIVE METABOLIC PANEL (CC13)
ALT: 11 U/L (ref 0–55)
AST: 18 U/L (ref 5–34)
Albumin: 3.4 g/dL — ABNORMAL LOW (ref 3.5–5.0)
Alkaline Phosphatase: 79 U/L (ref 40–150)
Anion Gap: 7 mEq/L (ref 3–11)
BUN: 18 mg/dL (ref 7.0–26.0)
CO2: 26 mEq/L (ref 22–29)
Calcium: 9.2 mg/dL (ref 8.4–10.4)
Chloride: 109 mEq/L (ref 98–109)
Creatinine: 1 mg/dL (ref 0.6–1.1)
EGFR: 62 mL/min/{1.73_m2} — ABNORMAL LOW (ref >90)
Glucose: 141 mg/dl — ABNORMAL HIGH (ref 70–140)
Potassium: 4.1 mEq/L (ref 3.5–5.1)
Sodium: 142 mEq/L (ref 136–145)
Total Bilirubin: 0.28 mg/dL (ref 0.20–1.20)
Total Protein: 6.8 g/dL (ref 6.4–8.3)

## 2014-08-14 NOTE — Progress Notes (Signed)
[facility] Telephone:(336) [phone]   Fax:(336) [place], [address] [address]  DIAGNOSIS:  1) idiopathic thrombocytopenic purpura 2) History of right breast cancer in 1991, post mastectomy with axillary node evaluation by Dr Kathrin Penner followed by tamoxifen x 5 years.  PRIOR THERAPY: None  CURRENT THERAPY: Observation.  INTERVAL HISTORY: Leah Wiggins 81 y.o. female returns to the clinic today for follow-up visit accompanied by her daughter. The patient was last seen by Dr. Juliann Mule a year ago. She has been doing fine with no specific complaints. The patient and her daughter do not know why she comes to the [facility]. She denied having any significant bleeding issues. She has no bruises or ecchymosis. The patient denied having any significant weight loss or night sweats. She has no nausea or vomiting, no fever or chills. The patient denied having any significant chest pain, shortness of breath, cough or hemoptysis. Her platelets count has been in the range of 100,000 over the last few years.  MEDICAL HISTORY: Past Medical History  Diagnosis Date  . Breast cancer 1991  . Diabetes mellitus   . Non-stress test nonreactive, 04/24/12, normal 12/10/2012  . Tobacco use 12/10/2012  . PVD (peripheral vascular disease)   . Hypertension   . Hyperlipidemia     ALLERGIES:  is allergic to penicillins.  MEDICATIONS:  Current Outpatient Prescriptions  Medication Sig Dispense Refill  . alendronate (FOSAMAX) 70 MG tablet Take 70 mg by mouth every 7 (seven) days. Take with a full glass of water on an empty stomach. Wednesdays    . aspirin EC 81 MG tablet Take 81 mg by mouth daily.    . Cholecalciferol (VITAMIN D3) 50000 UNITS CAPS Take 1 capsule by mouth once a week.    Marland Kitchen HYDROcodone-acetaminophen (NORCO) 5-325 MG per tablet Take 1 tablet by mouth every 4 (four) hours as needed for moderate pain.  20 tablet 0  . metFORMIN (GLUCOPHAGE-XR) 500 MG 24 hr tablet Take 500 mg by mouth 2 (two) times daily.    . mometasone (ELOCON) 0.1 % lotion Apply 1 application topically daily as needed.  0  . nicotine (NICODERM CQ - DOSED IN MG/24 HOURS) 21 mg/24hr patch Place 1 patch (21 mg total) onto the skin daily. 28 patch 0  . pantoprazole (PROTONIX) 40 MG tablet Take 1 tablet (40 mg total) by mouth daily at 12 noon. 30 tablet 0  . rosuvastatin (CRESTOR) 40 MG tablet Take 40 mg by mouth daily.    Marland Kitchen SPIRIVA HANDIHALER 18 MCG inhalation capsule Place 18 mcg into inhaler and inhale daily as needed (shortness of breath).     . valsartan (DIOVAN) 160 MG tablet Take 160 mg by mouth daily.       No current facility-administered medications for this visit.    SURGICAL HISTORY:  Past Surgical History  Procedure Laterality Date  . Mastectomy partial / lumpectomy w/ axillary lymphadenectomy Right 1991   . Total hip arthroplasty Right 2011  . Lower extremity arterial doppler Bilateral 05/07/2012    Left ABI-demonstrated mild arterial insufficiency. Right CIA 50-69% diameter reduction. Right SFA less than 50% diameter reduction. Left SFA 70-99% diameter reduction. Bilateral Runoff-Posterior tibial arteries appeared occluded.  . Cardiovascular stress test  04/24/2012    No significant ST segment change suggestive of ischemia.    REVIEW OF SYSTEMS:  A comprehensive review of systems was negative.   PHYSICAL EXAMINATION:  General appearance: alert, cooperative and no distress Head: Normocephalic, without obvious abnormality, atraumatic Neck: no adenopathy, no JVD, supple, symmetrical, trachea midline and thyroid not enlarged, symmetric, no tenderness/mass/nodules Lymph nodes: Cervical, supraclavicular, and axillary nodes normal. Resp: clear to auscultation bilaterally Back: symmetric, no curvature. ROM normal. No CVA tenderness. Cardio: regular rate and rhythm, S1, S2 normal, no murmur, click, rub or gallop GI:  soft, non-tender; bowel sounds normal; no masses,  no organomegaly Extremities: extremities normal, atraumatic, no cyanosis or edema  ECOG PERFORMANCE STATUS: 1 - Symptomatic but completely ambulatory  Blood pressure 158/80, pulse 77, temperature 98.2 F (36.8 C), temperature source Oral, resp. rate 18, height '5\' 3"'$  (1.6 m), weight 175 lb 1.6 oz (79.425 kg), SpO2 97 %.  LABORATORY DATA: Lab Results  Component Value Date   WBC 5.2 04/29/2014   HGB 11.0* 04/29/2014   HCT 33.8* 04/29/2014   MCV 77.7* 04/29/2014   PLT 103* 04/29/2014      Chemistry      Component Value Date/Time   NA 140 04/29/2014 0700   NA 143 07/25/2012 0849   K 4.8 04/29/2014 0700   K 4.0 07/25/2012 0849   CL 112 04/29/2014 0700   CL 110* 07/25/2012 0849   CO2 24 04/29/2014 0700   CO2 24 07/25/2012 0849   BUN 16 04/29/2014 0700   BUN 14.2 07/25/2012 0849   CREATININE 0.88 04/29/2014 0700   CREATININE 1.0 07/25/2012 0849   CREATININE 0.89 07/07/2010 1107      Component Value Date/Time   CALCIUM 9.4 04/29/2014 0700   CALCIUM 9.6 04/28/2014 1925   CALCIUM 9.0 07/25/2012 0849   ALKPHOS 58 04/29/2014 0700   AST 17 04/29/2014 0700   ALT 13 04/29/2014 0700   BILITOT 0.5 04/29/2014 0700       RADIOGRAPHIC STUDIES: No results found.  ASSESSMENT AND PLAN: This is a very pleasant 81 years old African-American female with remote history of breast cancer was no evidence of recurrence and the patient also has mild thrombocytopenia and has been observation for several years with no significant bleeding, bruises or ecchymosis. I recommended for the patient to have repeat CBC and comprehensive metabolic panel today. History no significant decline in her platelets count, I will discharge the patient from the clinic and see her on as-needed basis if she has any significant bleeding, bruises or ecchymosis or platelets count less than 30,000. The patient her daughter agreed to the current plan. She was advised to  call immediately if she has any concerning symptoms. The patient voices understanding of current disease status and treatment options and is in agreement with the current care plan.  All questions were answered. The patient knows to call the clinic with any problems, questions or concerns. We can certainly see the patient much sooner if necessary.  Disclaimer: This note was dictated with voice recognition software. Similar sounding words can inadvertently be transcribed and may not be corrected upon review.

## 2014-08-25 DIAGNOSIS — K219 Gastro-esophageal reflux disease without esophagitis: Secondary | ICD-10-CM | POA: Diagnosis not present

## 2014-09-02 DIAGNOSIS — E119 Type 2 diabetes mellitus without complications: Secondary | ICD-10-CM | POA: Diagnosis not present

## 2014-09-02 DIAGNOSIS — H6011 Cellulitis of right external ear: Secondary | ICD-10-CM | POA: Diagnosis not present

## 2014-09-02 DIAGNOSIS — H608X3 Other otitis externa, bilateral: Secondary | ICD-10-CM | POA: Diagnosis not present

## 2014-09-02 DIAGNOSIS — H903 Sensorineural hearing loss, bilateral: Secondary | ICD-10-CM | POA: Diagnosis not present

## 2014-09-09 DIAGNOSIS — H903 Sensorineural hearing loss, bilateral: Secondary | ICD-10-CM | POA: Diagnosis not present

## 2014-09-29 DIAGNOSIS — F17210 Nicotine dependence, cigarettes, uncomplicated: Secondary | ICD-10-CM | POA: Diagnosis not present

## 2014-09-29 DIAGNOSIS — N08 Glomerular disorders in diseases classified elsewhere: Secondary | ICD-10-CM | POA: Diagnosis not present

## 2014-09-29 DIAGNOSIS — N182 Chronic kidney disease, stage 2 (mild): Secondary | ICD-10-CM | POA: Diagnosis not present

## 2014-09-29 DIAGNOSIS — R413 Other amnesia: Secondary | ICD-10-CM | POA: Diagnosis not present

## 2014-09-29 DIAGNOSIS — Z1389 Encounter for screening for other disorder: Secondary | ICD-10-CM | POA: Diagnosis not present

## 2014-09-29 DIAGNOSIS — E1122 Type 2 diabetes mellitus with diabetic chronic kidney disease: Secondary | ICD-10-CM | POA: Diagnosis not present

## 2014-09-29 DIAGNOSIS — E559 Vitamin D deficiency, unspecified: Secondary | ICD-10-CM | POA: Diagnosis not present

## 2014-09-29 DIAGNOSIS — I129 Hypertensive chronic kidney disease with stage 1 through stage 4 chronic kidney disease, or unspecified chronic kidney disease: Secondary | ICD-10-CM | POA: Diagnosis not present

## 2014-09-29 DIAGNOSIS — Z0000 Encounter for general adult medical examination without abnormal findings: Secondary | ICD-10-CM | POA: Diagnosis not present

## 2014-10-13 DIAGNOSIS — R319 Hematuria, unspecified: Secondary | ICD-10-CM | POA: Diagnosis not present

## 2014-12-01 DIAGNOSIS — I129 Hypertensive chronic kidney disease with stage 1 through stage 4 chronic kidney disease, or unspecified chronic kidney disease: Secondary | ICD-10-CM | POA: Diagnosis not present

## 2014-12-01 DIAGNOSIS — E1121 Type 2 diabetes mellitus with diabetic nephropathy: Secondary | ICD-10-CM | POA: Diagnosis not present

## 2014-12-01 DIAGNOSIS — M79671 Pain in right foot: Secondary | ICD-10-CM | POA: Diagnosis not present

## 2014-12-01 DIAGNOSIS — N182 Chronic kidney disease, stage 2 (mild): Secondary | ICD-10-CM | POA: Diagnosis not present

## 2014-12-18 DIAGNOSIS — Z1231 Encounter for screening mammogram for malignant neoplasm of breast: Secondary | ICD-10-CM | POA: Diagnosis not present

## 2015-01-05 DIAGNOSIS — N182 Chronic kidney disease, stage 2 (mild): Secondary | ICD-10-CM | POA: Diagnosis not present

## 2015-01-05 DIAGNOSIS — I129 Hypertensive chronic kidney disease with stage 1 through stage 4 chronic kidney disease, or unspecified chronic kidney disease: Secondary | ICD-10-CM | POA: Diagnosis not present

## 2015-01-05 DIAGNOSIS — N08 Glomerular disorders in diseases classified elsewhere: Secondary | ICD-10-CM | POA: Diagnosis not present

## 2015-01-05 DIAGNOSIS — E1122 Type 2 diabetes mellitus with diabetic chronic kidney disease: Secondary | ICD-10-CM | POA: Diagnosis not present

## 2015-01-05 DIAGNOSIS — D696 Thrombocytopenia, unspecified: Secondary | ICD-10-CM | POA: Diagnosis not present

## 2015-01-12 DIAGNOSIS — E119 Type 2 diabetes mellitus without complications: Secondary | ICD-10-CM | POA: Diagnosis not present

## 2015-01-12 DIAGNOSIS — M216X1 Other acquired deformities of right foot: Secondary | ICD-10-CM | POA: Diagnosis not present

## 2015-01-12 DIAGNOSIS — L602 Onychogryphosis: Secondary | ICD-10-CM | POA: Diagnosis not present

## 2015-01-12 DIAGNOSIS — M216X2 Other acquired deformities of left foot: Secondary | ICD-10-CM | POA: Diagnosis not present

## 2015-02-16 DIAGNOSIS — H6123 Impacted cerumen, bilateral: Secondary | ICD-10-CM | POA: Diagnosis not present

## 2015-02-16 DIAGNOSIS — E119 Type 2 diabetes mellitus without complications: Secondary | ICD-10-CM | POA: Diagnosis not present

## 2015-02-16 DIAGNOSIS — H60393 Other infective otitis externa, bilateral: Secondary | ICD-10-CM | POA: Diagnosis not present

## 2015-02-16 DIAGNOSIS — H903 Sensorineural hearing loss, bilateral: Secondary | ICD-10-CM | POA: Diagnosis not present

## 2015-02-23 DIAGNOSIS — H6123 Impacted cerumen, bilateral: Secondary | ICD-10-CM | POA: Diagnosis not present

## 2015-02-23 DIAGNOSIS — H903 Sensorineural hearing loss, bilateral: Secondary | ICD-10-CM | POA: Diagnosis not present

## 2015-02-23 DIAGNOSIS — E119 Type 2 diabetes mellitus without complications: Secondary | ICD-10-CM | POA: Diagnosis not present

## 2015-02-23 DIAGNOSIS — H60393 Other infective otitis externa, bilateral: Secondary | ICD-10-CM | POA: Diagnosis not present

## 2015-03-03 DIAGNOSIS — R109 Unspecified abdominal pain: Secondary | ICD-10-CM | POA: Diagnosis not present

## 2015-03-03 DIAGNOSIS — R110 Nausea: Secondary | ICD-10-CM | POA: Diagnosis not present

## 2015-03-05 DIAGNOSIS — R109 Unspecified abdominal pain: Secondary | ICD-10-CM | POA: Diagnosis not present

## 2015-03-05 DIAGNOSIS — R110 Nausea: Secondary | ICD-10-CM | POA: Diagnosis not present

## 2015-03-23 DIAGNOSIS — I70293 Other atherosclerosis of native arteries of extremities, bilateral legs: Secondary | ICD-10-CM | POA: Diagnosis not present

## 2015-03-23 DIAGNOSIS — M2042 Other hammer toe(s) (acquired), left foot: Secondary | ICD-10-CM | POA: Diagnosis not present

## 2015-03-23 DIAGNOSIS — E1140 Type 2 diabetes mellitus with diabetic neuropathy, unspecified: Secondary | ICD-10-CM | POA: Diagnosis not present

## 2015-03-23 DIAGNOSIS — L602 Onychogryphosis: Secondary | ICD-10-CM | POA: Diagnosis not present

## 2015-03-23 DIAGNOSIS — L84 Corns and callosities: Secondary | ICD-10-CM | POA: Diagnosis not present

## 2015-03-30 DIAGNOSIS — Z961 Presence of intraocular lens: Secondary | ICD-10-CM | POA: Diagnosis not present

## 2015-03-30 DIAGNOSIS — E119 Type 2 diabetes mellitus without complications: Secondary | ICD-10-CM | POA: Diagnosis not present

## 2015-04-06 DIAGNOSIS — N182 Chronic kidney disease, stage 2 (mild): Secondary | ICD-10-CM | POA: Diagnosis not present

## 2015-04-06 DIAGNOSIS — N08 Glomerular disorders in diseases classified elsewhere: Secondary | ICD-10-CM | POA: Diagnosis not present

## 2015-04-06 DIAGNOSIS — E1122 Type 2 diabetes mellitus with diabetic chronic kidney disease: Secondary | ICD-10-CM | POA: Diagnosis not present

## 2015-04-06 DIAGNOSIS — I129 Hypertensive chronic kidney disease with stage 1 through stage 4 chronic kidney disease, or unspecified chronic kidney disease: Secondary | ICD-10-CM | POA: Diagnosis not present

## 2015-04-15 DIAGNOSIS — M25512 Pain in left shoulder: Secondary | ICD-10-CM | POA: Diagnosis not present

## 2015-04-15 DIAGNOSIS — M19012 Primary osteoarthritis, left shoulder: Secondary | ICD-10-CM | POA: Diagnosis not present

## 2015-04-27 DIAGNOSIS — E119 Type 2 diabetes mellitus without complications: Secondary | ICD-10-CM | POA: Diagnosis not present

## 2015-04-27 DIAGNOSIS — H903 Sensorineural hearing loss, bilateral: Secondary | ICD-10-CM | POA: Diagnosis not present

## 2015-04-27 DIAGNOSIS — H608X3 Other otitis externa, bilateral: Secondary | ICD-10-CM | POA: Diagnosis not present

## 2015-05-13 ENCOUNTER — Encounter (HOSPITAL_COMMUNITY): Payer: Self-pay | Admitting: Emergency Medicine

## 2015-05-13 ENCOUNTER — Emergency Department (INDEPENDENT_AMBULATORY_CARE_PROVIDER_SITE_OTHER)
Admission: EM | Admit: 2015-05-13 | Discharge: 2015-05-13 | Disposition: A | Discharge status: Home / Self Care | Payer: Medicare Other | Source: Home / Self Care | Attending: Emergency Medicine | Admitting: Emergency Medicine

## 2015-05-13 DIAGNOSIS — S0990XA Unspecified injury of head, initial encounter: Secondary | ICD-10-CM | POA: Diagnosis not present

## 2015-05-13 MED ORDER — SILVER NITRATE-POT NITRATE 75-25 % EX MISC
CUTANEOUS | Status: AC
  Filled 2015-05-13: qty 3

## 2015-05-13 NOTE — ED Provider Notes (Signed)
CSN: [id number]     Arrival date & time 05/13/15  1405 History   First MD Initiated Contact with Patient 05/13/15 1552     Chief Complaint  Patient presents with  . Head Injury   (Consider location/radiation/quality/duration/timing/severity/associated sxs/prior Treatment) HPI  She is an 82 year old woman here with her daughter for evaluation of head injury. She states about 2 weeks ago she hit her head on an open cupboard door. She denies any loss of consciousness or bleeding. She is here today That has not healed as she expected. It continues to be very sore. She denies any vision changes, difficulties with speech or swallowing, focal numbness, tingling, or weakness.  Past Medical History  Diagnosis Date  . Breast cancer ([place]) 1991  . Diabetes mellitus   . Non-stress test nonreactive, 04/24/12, normal 12/10/2012  . Tobacco use 12/10/2012  . PVD (peripheral vascular disease) ([place])   . Hypertension   . Hyperlipidemia    Past Surgical History  Procedure Laterality Date  . Mastectomy partial / lumpectomy w/ axillary lymphadenectomy Right 1991   . Total hip arthroplasty Right 2011  . Lower extremity arterial doppler Bilateral 05/07/2012    Left ABI-demonstrated mild arterial insufficiency. Right CIA 50-69% diameter reduction. Right SFA less than 50% diameter reduction. Left SFA 70-99% diameter reduction. Bilateral Runoff-Posterior tibial arteries appeared occluded.  . Cardiovascular stress test  04/24/2012    No significant ST segment change suggestive of ischemia.   Family History  Problem Relation Age of Onset  . Heart disease Mother   . Stroke Mother   . Stroke Father   . Cancer Sister     GIST  . Cancer Brother     thyroid   . Diabetes Son   . Stroke Son    Social History  Substance Use Topics  . Smoking status: Current Some Day Smoker -- 0.25 packs/day for 65 years    Types: Cigarettes    Start date: 03/08/1947  . Smokeless tobacco: Never Used     Comment: using nicoderm  patches to quit smoking  . Alcohol Use: No   OB History    No data available     Review of Systems As in history of present illness Allergies  Penicillins  Home Medications   Prior to Admission medications   Medication Sig Start Date End Date Taking? Authorizing Provider  alendronate (FOSAMAX) 70 MG tablet Take 70 mg by mouth every 7 (seven) days. Take with a full glass of water on an empty stomach. Wednesdays   Yes Historical Provider, MD  aspirin EC 81 MG tablet Take 81 mg by mouth daily.   Yes Historical Provider, MD  Cholecalciferol (VITAMIN D3) 50000 UNITS CAPS Take 1 capsule by mouth once a week.   Yes Historical Provider, MD  metFORMIN (GLUCOPHAGE-XR) 500 MG 24 hr tablet Take 500 mg by mouth 2 (two) times daily.   Yes Historical Provider, MD  pantoprazole (PROTONIX) 40 MG tablet Take 1 tablet (40 mg total) by mouth daily at 12 noon. 03/04/14  Yes Silver Huguenin Elgergawy, MD  rosuvastatin (CRESTOR) 40 MG tablet Take 40 mg by mouth daily.   Yes Historical Provider, MD  valsartan (DIOVAN) 160 MG tablet Take 160 mg by mouth daily.     Yes Historical Provider, MD  HYDROcodone-acetaminophen (NORCO) 5-325 MG per tablet Take 1 tablet by mouth every 4 (four) hours as needed for moderate pain. 12/19/13   Freeman Caldron Baker, PA-C  mometasone (ELOCON) 0.1 % lotion Apply 1 application topically  daily as needed. 04/15/14   Historical Provider, MD  nicotine (NICODERM CQ - DOSED IN MG/24 HOURS) 21 mg/24hr patch Place 1 patch (21 mg total) onto the skin daily. 03/04/14   Silver Huguenin Elgergawy, MD  SPIRIVA HANDIHALER 18 MCG inhalation capsule Place 18 mcg into inhaler and inhale daily as needed (shortness of breath).  11/05/11   Historical Provider, MD   Meds Ordered and Administered this Visit  Medications - No data to display  BP 120/45 mmHg  Pulse 90  Temp(Src) 98.3 F (36.8 C) (Oral)  Resp 16  SpO2 97% No data found.   Physical Exam  Constitutional: She is oriented to person, place, and time. She  appears well-developed and well-nourished. No distress.  Cardiovascular: Normal rate.   Pulmonary/Chest: Effort normal.  Neurological: She is alert and oriented to person, place, and time.  Skin:  0.5 cm tender nodule on left forehead and hairline. There is some overgrown granulation tissue present. It feels somewhat cystic.    ED Course  Procedures (including critical care time)  Labs Review Labs Reviewed - No data to display  Imaging Review No results found.   MDM   1. Head injury, initial encounter    Wound was cleaned and debrided. Silver nitrate was used on excessive granulation tissue. Wound care discussed. She has an appointment with her PCP later this month.    Melony Overly, MD 05/13/15 [phone]

## 2015-05-13 NOTE — ED Notes (Signed)
Pt hit her head on an open cabinet door about two weeks ago.  She has a dime sized sore on her head where she hit it.  Pt reports soreness and the daughter is concerned that it has not healed yet.  Pt denies LOC when the injury occurred.

## 2015-05-13 NOTE — Discharge Instructions (Signed)
The wound wasn't healing because there was too much granulation tissue. We took care of that today. Wash the area with soap and water twice a day. This should slowly improve. Have your primary doctor look at it when you see him later this month.

## 2015-05-18 DIAGNOSIS — E119 Type 2 diabetes mellitus without complications: Secondary | ICD-10-CM | POA: Diagnosis not present

## 2015-05-18 DIAGNOSIS — H903 Sensorineural hearing loss, bilateral: Secondary | ICD-10-CM | POA: Diagnosis not present

## 2015-05-18 DIAGNOSIS — H608X3 Other otitis externa, bilateral: Secondary | ICD-10-CM | POA: Diagnosis not present

## 2015-05-28 DIAGNOSIS — M7742 Metatarsalgia, left foot: Secondary | ICD-10-CM | POA: Diagnosis not present

## 2015-05-28 DIAGNOSIS — M7741 Metatarsalgia, right foot: Secondary | ICD-10-CM | POA: Diagnosis not present

## 2015-05-28 DIAGNOSIS — W2203XS Walked into furniture, sequela: Secondary | ICD-10-CM | POA: Diagnosis not present

## 2015-05-28 DIAGNOSIS — Y92000 Kitchen of unspecified non-institutional (private) residence as  the place of occurrence of the external cause: Secondary | ICD-10-CM | POA: Diagnosis not present

## 2015-05-28 DIAGNOSIS — S0990XS Unspecified injury of head, sequela: Secondary | ICD-10-CM | POA: Diagnosis not present

## 2015-05-28 DIAGNOSIS — Z09 Encounter for follow-up examination after completed treatment for conditions other than malignant neoplasm: Secondary | ICD-10-CM | POA: Diagnosis not present

## 2015-06-01 DIAGNOSIS — L84 Corns and callosities: Secondary | ICD-10-CM | POA: Diagnosis not present

## 2015-06-01 DIAGNOSIS — M7742 Metatarsalgia, left foot: Secondary | ICD-10-CM | POA: Diagnosis not present

## 2015-06-01 DIAGNOSIS — E1140 Type 2 diabetes mellitus with diabetic neuropathy, unspecified: Secondary | ICD-10-CM | POA: Diagnosis not present

## 2015-06-01 DIAGNOSIS — L602 Onychogryphosis: Secondary | ICD-10-CM | POA: Diagnosis not present

## 2015-06-01 DIAGNOSIS — M7741 Metatarsalgia, right foot: Secondary | ICD-10-CM | POA: Diagnosis not present

## 2015-06-24 ENCOUNTER — Encounter: Payer: Self-pay | Admitting: Cardiovascular Disease

## 2015-06-24 ENCOUNTER — Ambulatory Visit (INDEPENDENT_AMBULATORY_CARE_PROVIDER_SITE_OTHER): Payer: Medicare Other | Admitting: Cardiovascular Disease

## 2015-06-24 VITALS — BP 146/56 | HR 66 | Ht 63.0 in | Wt 170.4 lb

## 2015-06-24 DIAGNOSIS — E785 Hyperlipidemia, unspecified: Secondary | ICD-10-CM

## 2015-06-24 DIAGNOSIS — R079 Chest pain, unspecified: Secondary | ICD-10-CM | POA: Diagnosis not present

## 2015-06-24 DIAGNOSIS — I10 Essential (primary) hypertension: Secondary | ICD-10-CM | POA: Diagnosis not present

## 2015-06-24 DIAGNOSIS — I739 Peripheral vascular disease, unspecified: Secondary | ICD-10-CM | POA: Diagnosis not present

## 2015-06-24 NOTE — Assessment & Plan Note (Signed)
History of dyslipidemia on statin therapy followed by her PCP 

## 2015-06-24 NOTE — Progress Notes (Signed)
06/24/2015 Leah Wiggins   1927/01/19  [id number]  Primary Physician Maximino Greenland, MD Primary Cardiologist: Lorretta Harp MD Renae Gloss   HPI:  The patient returns today for followup. She is an 82 year old mildly overweight widowed [place] American female, mother of 16, grandmother of greater than 101 grandchildren. She is accompanied by her daughter [name] today. I last saw her in the office 06/24/14.Marland Kitchen Her risk factors include recently discontinued tobacco abuse having smoked for many years and currently wearing a nicotine patch., contrary, hypertension, mild hyperlipidemia, and non-insulin-requiring diabetes. She also has peripheral vascular occlusive disease with Dopplers that show a right ABI of .64 with a high-frequency signal in the origin of the right common iliac artery I saw this progression of disease in the distal right SFA., left ABI of 0.84 with a high-frequency signal in the origin of the left SFA, though she really denies claudication. She had a negative Myoview performed 04/24/12 and more recently 04/29/14 for atypical chest pain.   Current Outpatient Prescriptions  Medication Sig Dispense Refill  . alendronate (FOSAMAX) 70 MG tablet Take 70 mg by mouth every 7 (seven) days. Take with a full glass of water on an empty stomach. Wednesdays    . aspirin EC 81 MG tablet Take 81 mg by mouth daily.    Marland Kitchen HYDROcodone-acetaminophen (NORCO) 5-325 MG per tablet Take 1 tablet by mouth every 4 (four) hours as needed for moderate pain. 20 tablet 0  . metFORMIN (GLUCOPHAGE-XR) 500 MG 24 hr tablet Take 500 mg by mouth 2 (two) times daily.    . mometasone (ELOCON) 0.1 % lotion Apply 1 application topically daily as needed.  0  . nicotine (NICODERM CQ - DOSED IN MG/24 HOURS) 21 mg/24hr patch Place 1 patch (21 mg total) onto the skin daily. 28 patch 0  . pantoprazole (PROTONIX) 40 MG tablet Take 1 tablet (40 mg total) by mouth daily at 12 noon. 30 tablet 0  . rosuvastatin  (CRESTOR) 40 MG tablet Take 40 mg by mouth daily.    Marland Kitchen SPIRIVA HANDIHALER 18 MCG inhalation capsule Place 18 mcg into inhaler and inhale daily as needed (shortness of breath).     . valsartan (DIOVAN) 160 MG tablet Take 160 mg by mouth daily.       No current facility-administered medications for this visit.    Allergies  Allergen Reactions  . Penicillins Other (See Comments)    unknown    Social History   Social History  . Marital Status: Widowed    Spouse Name: N/A  . Number of Children: N/A  . Years of Education: N/A   Occupational History  . Not on file.   Social History Main Topics  . Smoking status: Current Some Day Smoker -- 0.25 packs/day for 65 years    Types: Cigarettes    Start date: 03/08/1947  . Smokeless tobacco: Never Used     Comment: using nicoderm patches to quit smoking  . Alcohol Use: No  . Drug Use: No  . Sexual Activity: No   Other Topics Concern  . Not on file   Social History Narrative     Review of Systems: General: negative for chills, fever, night sweats or weight changes.  Cardiovascular: negative for chest pain, dyspnea on exertion, edema, orthopnea, palpitations, paroxysmal nocturnal dyspnea or shortness of breath Dermatological: negative for rash Respiratory: negative for cough or wheezing Urologic: negative for hematuria Abdominal: negative for nausea, vomiting, diarrhea, bright red blood per rectum, melena,  or hematemesis Neurologic: negative for visual changes, syncope, or dizziness All other systems reviewed and are otherwise negative except as noted above.    Blood pressure 146/56, pulse 66, height '5\' 3"'$  (1.6 m), weight 170 lb 6.4 oz (77.293 kg).  General appearance: alert and no distress Neck: no adenopathy, no carotid bruit, no JVD, supple, symmetrical, trachea midline and thyroid not enlarged, symmetric, no tenderness/mass/nodules Lungs: clear to auscultation bilaterally Heart: regular rate and rhythm, S1, S2 normal, no  murmur, click, rub or gallop Extremities: extremities normal, atraumatic, no cyanosis or edema  EKG normal sinus rhythm at 66 without ST or T-wave changes. I personally reviewed this EKG  ASSESSMENT AND PLAN:   PAD (peripheral artery disease), abnormal ABIs History of peripheral arterial disease with duplex ultrasound the past revealing a right ABI that it decreased from 0.9 -.64. She has bilateral SFA disease and iliac artery disease as well although she denies claudication.  Dyslipidemia History of dyslipidemia on statin therapy followed by her PCP  Essential hypertension History of hypertension with blood pressure today 150/80. She is on Diovan. Continue current meds current dose      Lorretta Harp MD [facility], [facility] 06/24/2015 11:57 AM

## 2015-06-24 NOTE — Patient Instructions (Signed)

## 2015-06-24 NOTE — Assessment & Plan Note (Signed)
History of hypertension with blood pressure today 150/80. She is on Diovan. Continue current meds current dose

## 2015-06-24 NOTE — Assessment & Plan Note (Signed)
History of peripheral arterial disease with duplex ultrasound the past revealing a right ABI that it decreased from 0.9 -.64. She has bilateral SFA disease and iliac artery disease as well although she denies claudication.

## 2015-07-06 DIAGNOSIS — E1122 Type 2 diabetes mellitus with diabetic chronic kidney disease: Secondary | ICD-10-CM | POA: Diagnosis not present

## 2015-07-06 DIAGNOSIS — E559 Vitamin D deficiency, unspecified: Secondary | ICD-10-CM | POA: Diagnosis not present

## 2015-07-06 DIAGNOSIS — I129 Hypertensive chronic kidney disease with stage 1 through stage 4 chronic kidney disease, or unspecified chronic kidney disease: Secondary | ICD-10-CM | POA: Diagnosis not present

## 2015-07-06 DIAGNOSIS — N08 Glomerular disorders in diseases classified elsewhere: Secondary | ICD-10-CM | POA: Diagnosis not present

## 2015-07-06 DIAGNOSIS — N182 Chronic kidney disease, stage 2 (mild): Secondary | ICD-10-CM | POA: Diagnosis not present

## 2015-07-07 DIAGNOSIS — M25552 Pain in left hip: Secondary | ICD-10-CM | POA: Diagnosis not present

## 2015-07-07 DIAGNOSIS — M5136 Other intervertebral disc degeneration, lumbar region: Secondary | ICD-10-CM | POA: Diagnosis not present

## 2015-07-17 DIAGNOSIS — M5136 Other intervertebral disc degeneration, lumbar region: Secondary | ICD-10-CM | POA: Diagnosis not present

## 2015-07-22 DIAGNOSIS — M5136 Other intervertebral disc degeneration, lumbar region: Secondary | ICD-10-CM | POA: Diagnosis not present

## 2015-07-24 DIAGNOSIS — M5136 Other intervertebral disc degeneration, lumbar region: Secondary | ICD-10-CM | POA: Diagnosis not present

## 2015-07-27 DIAGNOSIS — M5136 Other intervertebral disc degeneration, lumbar region: Secondary | ICD-10-CM | POA: Diagnosis not present

## 2015-07-30 DIAGNOSIS — M5136 Other intervertebral disc degeneration, lumbar region: Secondary | ICD-10-CM | POA: Diagnosis not present

## 2015-08-05 DIAGNOSIS — M25552 Pain in left hip: Secondary | ICD-10-CM | POA: Diagnosis not present

## 2015-08-05 DIAGNOSIS — M5136 Other intervertebral disc degeneration, lumbar region: Secondary | ICD-10-CM | POA: Diagnosis not present

## 2015-08-05 DIAGNOSIS — K649 Unspecified hemorrhoids: Secondary | ICD-10-CM | POA: Diagnosis not present

## 2015-08-19 DIAGNOSIS — H903 Sensorineural hearing loss, bilateral: Secondary | ICD-10-CM | POA: Diagnosis not present

## 2015-08-19 DIAGNOSIS — H608X3 Other otitis externa, bilateral: Secondary | ICD-10-CM | POA: Diagnosis not present

## 2015-08-19 DIAGNOSIS — E119 Type 2 diabetes mellitus without complications: Secondary | ICD-10-CM | POA: Diagnosis not present

## 2015-08-19 DIAGNOSIS — H60393 Other infective otitis externa, bilateral: Secondary | ICD-10-CM | POA: Diagnosis not present

## 2015-08-24 DIAGNOSIS — H60393 Other infective otitis externa, bilateral: Secondary | ICD-10-CM | POA: Diagnosis not present

## 2015-08-24 DIAGNOSIS — E119 Type 2 diabetes mellitus without complications: Secondary | ICD-10-CM | POA: Diagnosis not present

## 2015-08-24 DIAGNOSIS — H608X3 Other otitis externa, bilateral: Secondary | ICD-10-CM | POA: Diagnosis not present

## 2015-08-24 DIAGNOSIS — H903 Sensorineural hearing loss, bilateral: Secondary | ICD-10-CM | POA: Diagnosis not present

## 2015-08-31 DIAGNOSIS — I70293 Other atherosclerosis of native arteries of extremities, bilateral legs: Secondary | ICD-10-CM | POA: Diagnosis not present

## 2015-08-31 DIAGNOSIS — L03031 Cellulitis of right toe: Secondary | ICD-10-CM | POA: Diagnosis not present

## 2015-09-01 DIAGNOSIS — W57XXXS Bitten or stung by nonvenomous insect and other nonvenomous arthropods, sequela: Secondary | ICD-10-CM | POA: Diagnosis not present

## 2015-09-01 DIAGNOSIS — M79671 Pain in right foot: Secondary | ICD-10-CM | POA: Diagnosis not present

## 2015-09-01 DIAGNOSIS — S90861S Insect bite (nonvenomous), right foot, sequela: Secondary | ICD-10-CM | POA: Diagnosis not present

## 2015-09-03 DIAGNOSIS — L84 Corns and callosities: Secondary | ICD-10-CM | POA: Diagnosis not present

## 2015-09-03 DIAGNOSIS — L602 Onychogryphosis: Secondary | ICD-10-CM | POA: Diagnosis not present

## 2015-09-03 DIAGNOSIS — S91331D Puncture wound without foreign body, right foot, subsequent encounter: Secondary | ICD-10-CM | POA: Diagnosis not present

## 2015-09-03 DIAGNOSIS — E1351 Other specified diabetes mellitus with diabetic peripheral angiopathy without gangrene: Secondary | ICD-10-CM | POA: Diagnosis not present

## 2015-09-14 DIAGNOSIS — E119 Type 2 diabetes mellitus without complications: Secondary | ICD-10-CM | POA: Diagnosis not present

## 2015-09-14 DIAGNOSIS — H608X3 Other otitis externa, bilateral: Secondary | ICD-10-CM | POA: Diagnosis not present

## 2015-09-14 DIAGNOSIS — H903 Sensorineural hearing loss, bilateral: Secondary | ICD-10-CM | POA: Diagnosis not present

## 2015-09-14 DIAGNOSIS — H60393 Other infective otitis externa, bilateral: Secondary | ICD-10-CM | POA: Diagnosis not present

## 2015-09-21 DIAGNOSIS — L602 Onychogryphosis: Secondary | ICD-10-CM | POA: Diagnosis not present

## 2015-09-21 DIAGNOSIS — L84 Corns and callosities: Secondary | ICD-10-CM | POA: Diagnosis not present

## 2015-09-21 DIAGNOSIS — B353 Tinea pedis: Secondary | ICD-10-CM | POA: Diagnosis not present

## 2015-09-21 DIAGNOSIS — S91331D Puncture wound without foreign body, right foot, subsequent encounter: Secondary | ICD-10-CM | POA: Diagnosis not present

## 2015-09-21 DIAGNOSIS — E1351 Other specified diabetes mellitus with diabetic peripheral angiopathy without gangrene: Secondary | ICD-10-CM | POA: Diagnosis not present

## 2015-10-12 DIAGNOSIS — L602 Onychogryphosis: Secondary | ICD-10-CM | POA: Diagnosis not present

## 2015-10-12 DIAGNOSIS — L84 Corns and callosities: Secondary | ICD-10-CM | POA: Diagnosis not present

## 2015-10-12 DIAGNOSIS — E1140 Type 2 diabetes mellitus with diabetic neuropathy, unspecified: Secondary | ICD-10-CM | POA: Diagnosis not present

## 2015-10-12 DIAGNOSIS — B353 Tinea pedis: Secondary | ICD-10-CM | POA: Diagnosis not present

## 2015-10-12 DIAGNOSIS — S91331D Puncture wound without foreign body, right foot, subsequent encounter: Secondary | ICD-10-CM | POA: Diagnosis not present

## 2015-10-31 ENCOUNTER — Encounter (HOSPITAL_COMMUNITY): Payer: Self-pay | Admitting: Emergency Medicine

## 2015-10-31 ENCOUNTER — Ambulatory Visit (HOSPITAL_COMMUNITY)
Admission: EM | Admit: 2015-10-31 | Discharge: 2015-10-31 | Disposition: A | Discharge status: Home / Self Care | Payer: Medicare Other | Attending: Emergency Medicine | Admitting: Emergency Medicine

## 2015-10-31 DIAGNOSIS — B353 Tinea pedis: Secondary | ICD-10-CM

## 2015-10-31 MED ORDER — CLOTRIMAZOLE-BETAMETHASONE 1-0.05 % EX CREA: TOPICAL_CREAM | CUTANEOUS | 0 refills | Status: DC

## 2015-10-31 MED ORDER — NYSTATIN 100000 UNIT/GM EX POWD: Freq: Two times a day (BID) | CUTANEOUS | 0 refills | Status: DC

## 2015-10-31 NOTE — ED Triage Notes (Signed)
Right foot has two areas that are weeping.  Onset a week ago and when noticed initially, areas were smaller.  Reports area itches.  Reports a month ago a tick was removed by pcp and was taking two antibiotics. Foot appeared normal prior to noticing this issue one week ago.

## 2015-10-31 NOTE — ED Provider Notes (Signed)
CSN: [id number]     Arrival date & time 10/31/15  1639 History   First MD Initiated Contact with Patient 10/31/15 1852     Chief Complaint  Patient presents with  . Insect Bite   (Consider location/radiation/quality/duration/timing/severity/associated sxs/prior Treatment) 82 year old female accompanied by her significant other presents with itching, redness and fluid leakage from the skin of her right foot. This started approximately 1-2 weeks ago. She states is more itchy and irritated than it is painful. She saw the podiatrist on August 7 and her feet were doing well at that time. Symptoms started approximately one week after that visit.      Past Medical History:  Diagnosis Date  . Breast cancer ([place]) 1991  . Diabetes mellitus   . Hyperlipidemia   . Hypertension   . Non-stress test nonreactive, 04/24/12, normal 12/10/2012  . PVD (peripheral vascular disease) ([place])   . Tobacco use 12/10/2012   Past Surgical History:  Procedure Laterality Date  . CARDIOVASCULAR STRESS TEST  04/24/2012   No significant ST segment change suggestive of ischemia.  . LOWER EXTREMITY ARTERIAL DOPPLER Bilateral 05/07/2012   Left ABI-demonstrated mild arterial insufficiency. Right CIA 50-69% diameter reduction. Right SFA less than 50% diameter reduction. Left SFA 70-99% diameter reduction. Bilateral Runoff-Posterior tibial arteries appeared occluded.  Marland Kitchen MASTECTOMY PARTIAL / LUMPECTOMY W/ AXILLARY LYMPHADENECTOMY Right 1991   . TOTAL HIP ARTHROPLASTY Right 2011   Family History  Problem Relation Age of Onset  . Heart disease Mother   . Stroke Mother   . Stroke Father   . Cancer Sister     GIST  . Cancer Brother     thyroid   . Diabetes Son   . Stroke Son    Social History  Substance Use Topics  . Smoking status: Current Some Day Smoker    Packs/day: 0.25    Years: 65.00    Types: Cigarettes    Start date: 03/08/1947  . Smokeless tobacco: Never Used     Comment: using nicoderm patches to quit  smoking  . Alcohol use No   OB History    No data available     Review of Systems  Constitutional: Negative.   HENT: Negative.   Respiratory: Negative.   Gastrointestinal: Negative.   Skin: Positive for rash.  Neurological: Negative.   All other systems reviewed and are negative.   Allergies  Penicillins  Home Medications   Prior to Admission medications   Medication Sig Start Date End Date Taking? Authorizing Provider  alendronate (FOSAMAX) 70 MG tablet Take 70 mg by mouth every 7 (seven) days. Take with a full glass of water on an empty stomach. Wednesdays    Historical Provider, MD  aspirin EC 81 MG tablet Take 81 mg by mouth daily.    Historical Provider, MD  clotrimazole-betamethasone (LOTRISONE) cream Apply to affected area 2 times daily prn 10/31/15   Janne Napoleon, NP  HYDROcodone-acetaminophen (NORCO) 5-325 MG per tablet Take 1 tablet by mouth every 4 (four) hours as needed for moderate pain. 12/19/13   Liam Graham, PA-C  metFORMIN (GLUCOPHAGE-XR) 500 MG 24 hr tablet Take 500 mg by mouth 2 (two) times daily.    Historical Provider, MD  mometasone (ELOCON) 0.1 % lotion Apply 1 application topically daily as needed. 04/15/14   Historical Provider, MD  nicotine (NICODERM CQ - DOSED IN MG/24 HOURS) 21 mg/24hr patch Place 1 patch (21 mg total) onto the skin daily. 03/04/14   Albertine Patricia, MD  nystatin (  MYCOSTATIN/NYSTOP) powder Apply topically 2 (two) times daily. 10/31/15   Janne Napoleon, NP  pantoprazole (PROTONIX) 40 MG tablet Take 1 tablet (40 mg total) by mouth daily at 12 noon. 03/04/14   Silver Huguenin Elgergawy, MD  rosuvastatin (CRESTOR) 40 MG tablet Take 40 mg by mouth daily.    Historical Provider, MD  SPIRIVA HANDIHALER 18 MCG inhalation capsule Place 18 mcg into inhaler and inhale daily as needed (shortness of breath).  11/05/11   Historical Provider, MD  valsartan (DIOVAN) 160 MG tablet Take 160 mg by mouth daily.      Historical Provider, MD   Meds Ordered and  Administered this Visit  Medications - No data to display  BP 145/63 (BP Location: Left Arm)   Pulse 72   Temp 99.3 F (37.4 C) (Oral)   Resp 16   SpO2 98%  No data found.   Physical Exam  Constitutional: She is oriented to person, place, and time. She appears well-developed and well-nourished. No distress.  HENT:  Head: Normocephalic and atraumatic.  Cardiovascular: Normal rate.   Pulmonary/Chest: Effort normal.  Musculoskeletal: She exhibits no edema or deformity.  Neurological: She is alert and oriented to person, place, and time.  Skin: Skin is warm and dry. Capillary refill takes less than 2 seconds.  Dorsum of the right foot with an annular area of discoloration, slightly hyperpigmented, rough in texture and mildly scaly. This condition of this can extends to the second third and fourth toe and a particularly the web spaces with there is some maceration. There is a clear, serous oozing from the rash involving the toes and distal forefoot. No evidence of insect bite or other wound. No evidence of bacterial infection. No cellulitis. No purulence.  Psychiatric: She has a normal mood and affect.  Nursing note and vitals reviewed.   Urgent Care Course   Clinical Course    Procedures (including critical care time)  Labs Review Labs Reviewed - No data to display  Imaging Review No results found.   Visual Acuity Review  Right Eye Distance:   Left Eye Distance:   Bilateral Distance:    Right Eye Near:   Left Eye Near:    Bilateral Near:         MDM   1. Infection, fungal, right foot    Keep the foot dry as possible. Make sure you dry between the toes and then discard the drying material. Apply the powder to the toes at least twice a day. Apply the cream twice a day. This may take 7-14 days to completely heal up. Follow-up with your primary care doctor in about 2 weeks. See sooner if worse. Meds ordered this encounter  Medications  . clotrimazole-betamethasone  (LOTRISONE) cream    Sig: Apply to affected area 2 times daily prn    Dispense:  30 g    Refill:  0    Order Specific Question:   Supervising Provider    Answer:   Sherlene Shams [[id number]]  . nystatin (MYCOSTATIN/NYSTOP) powder    Sig: Apply topically 2 (two) times daily.    Dispense:  15 g    Refill:  0    Order Specific Question:   Supervising Provider    Answer:   Sherlene Shams [[id number]]       Janne Napoleon, NP 10/31/15 1917    Janne Napoleon, NP 10/31/15 [phone]

## 2015-10-31 NOTE — Discharge Instructions (Signed)
Keep the foot dry as possible. Make sure you dry between the toes and then discard the drying material. Apply the powder to the toes at least twice a day. Apply the cream twice a day. This may take 7-14 days to completely heal up. Follow-up with your primary care doctor in about 2 weeks. See sooner if worse.

## 2015-11-06 DIAGNOSIS — R21 Rash and other nonspecific skin eruption: Secondary | ICD-10-CM | POA: Diagnosis not present

## 2015-11-06 DIAGNOSIS — A529 Late syphilis, unspecified: Secondary | ICD-10-CM | POA: Diagnosis not present

## 2015-11-13 DIAGNOSIS — E1351 Other specified diabetes mellitus with diabetic peripheral angiopathy without gangrene: Secondary | ICD-10-CM | POA: Diagnosis not present

## 2015-11-13 DIAGNOSIS — L84 Corns and callosities: Secondary | ICD-10-CM | POA: Diagnosis not present

## 2015-11-13 DIAGNOSIS — I70293 Other atherosclerosis of native arteries of extremities, bilateral legs: Secondary | ICD-10-CM | POA: Diagnosis not present

## 2015-11-13 DIAGNOSIS — B353 Tinea pedis: Secondary | ICD-10-CM | POA: Diagnosis not present

## 2015-11-13 DIAGNOSIS — L602 Onychogryphosis: Secondary | ICD-10-CM | POA: Diagnosis not present

## 2015-11-23 DIAGNOSIS — Z0000 Encounter for general adult medical examination without abnormal findings: Secondary | ICD-10-CM | POA: Diagnosis not present

## 2015-11-23 DIAGNOSIS — N182 Chronic kidney disease, stage 2 (mild): Secondary | ICD-10-CM | POA: Diagnosis not present

## 2015-11-23 DIAGNOSIS — L309 Dermatitis, unspecified: Secondary | ICD-10-CM | POA: Diagnosis not present

## 2015-11-23 DIAGNOSIS — E1122 Type 2 diabetes mellitus with diabetic chronic kidney disease: Secondary | ICD-10-CM | POA: Diagnosis not present

## 2015-11-23 DIAGNOSIS — N08 Glomerular disorders in diseases classified elsewhere: Secondary | ICD-10-CM | POA: Diagnosis not present

## 2015-11-23 DIAGNOSIS — I129 Hypertensive chronic kidney disease with stage 1 through stage 4 chronic kidney disease, or unspecified chronic kidney disease: Secondary | ICD-10-CM | POA: Diagnosis not present

## 2015-12-17 DIAGNOSIS — B354 Tinea corporis: Secondary | ICD-10-CM | POA: Diagnosis not present

## 2015-12-18 ENCOUNTER — Encounter (HOSPITAL_COMMUNITY): Payer: Self-pay

## 2015-12-18 ENCOUNTER — Emergency Department (HOSPITAL_COMMUNITY)
Admission: EM | Admit: 2015-12-18 | Discharge: 2015-12-18 | Disposition: A | Discharge status: Home / Self Care | Payer: Medicare Other | Attending: Emergency Medicine | Admitting: Emergency Medicine

## 2015-12-18 ENCOUNTER — Emergency Department (HOSPITAL_COMMUNITY): Payer: Medicare Other

## 2015-12-18 DIAGNOSIS — Z853 Personal history of malignant neoplasm of breast: Secondary | ICD-10-CM | POA: Insufficient documentation

## 2015-12-18 DIAGNOSIS — I10 Essential (primary) hypertension: Secondary | ICD-10-CM | POA: Diagnosis not present

## 2015-12-18 DIAGNOSIS — M25512 Pain in left shoulder: Secondary | ICD-10-CM | POA: Diagnosis not present

## 2015-12-18 DIAGNOSIS — Z7984 Long term (current) use of oral hypoglycemic drugs: Secondary | ICD-10-CM | POA: Insufficient documentation

## 2015-12-18 DIAGNOSIS — F17210 Nicotine dependence, cigarettes, uncomplicated: Secondary | ICD-10-CM | POA: Insufficient documentation

## 2015-12-18 DIAGNOSIS — Z96641 Presence of right artificial hip joint: Secondary | ICD-10-CM | POA: Diagnosis not present

## 2015-12-18 DIAGNOSIS — E119 Type 2 diabetes mellitus without complications: Secondary | ICD-10-CM | POA: Diagnosis not present

## 2015-12-18 DIAGNOSIS — Z7982 Long term (current) use of aspirin: Secondary | ICD-10-CM | POA: Insufficient documentation

## 2015-12-18 DIAGNOSIS — R918 Other nonspecific abnormal finding of lung field: Secondary | ICD-10-CM | POA: Diagnosis not present

## 2015-12-18 LAB — BASIC METABOLIC PANEL
Anion gap: 8 (ref 5–15)
BUN: 11 mg/dL (ref 6–20)
CO2: 26 mmol/L (ref 22–32)
Calcium: 8.9 mg/dL (ref 8.9–10.3)
Chloride: 109 mmol/L (ref 101–111)
Creatinine, Ser: 0.87 mg/dL (ref 0.44–1.00)
GFR calc Af Amer: >60 mL/min (ref >60)
GFR calc non Af Amer: 58 mL/min — ABNORMAL LOW (ref >60)
Glucose, Bld: 132 mg/dL — ABNORMAL HIGH (ref 65–99)
Potassium: 4.1 mmol/L (ref 3.5–5.1)
Sodium: 143 mmol/L (ref 135–145)

## 2015-12-18 LAB — CBC
HCT: 35.8 % — ABNORMAL LOW (ref 36.0–46.0)
Hemoglobin: 11.7 g/dL — ABNORMAL LOW (ref 12.0–15.0)
MCH: 25.2 pg — ABNORMAL LOW (ref 26.0–34.0)
MCHC: 32.7 g/dL (ref 30.0–36.0)
MCV: 77 fL — ABNORMAL LOW (ref 78.0–100.0)
Platelets: 95 10*3/uL — ABNORMAL LOW (ref 150–400)
RBC: 4.65 MIL/uL (ref 3.87–5.11)
RDW: 17.7 % — ABNORMAL HIGH (ref 11.5–15.5)
WBC: 5.9 10*3/uL (ref 4.0–10.5)

## 2015-12-18 LAB — I-STAT TROPONIN, ED
Troponin i, poc: 0 ng/mL (ref 0.00–0.08)
Troponin i, poc: 0 ng/mL (ref 0.00–0.08)

## 2015-12-18 NOTE — Discharge Instructions (Signed)
Please return without fail for worsening symptoms, including chest pain, difficulty breathing, intractable vomiting, passing out or feeling like you could pass out, fevers, or any other symptoms concerning to you. Please continue to follow-up with your dermatologist regarding further management of your rash.

## 2015-12-18 NOTE — ED Notes (Signed)
PT. Daughter at bedside states that pt. Had a tick bite on her right foot June 26th and had it removed. Shortly after her foot became reddened and swollen and has since been getting worse and now weeping. Pt. Also having itchy redness around neck.

## 2015-12-18 NOTE — ED Triage Notes (Addendum)
Per PT, Pt started to have sharp left shoulder pain that started this morning and radiates to the back of her neck. Reports pain subsiding after some time. Complains of some SOB with the pain. Pt has a rash noted to neck and body that has been assessed by dermatology yesterday.

## 2015-12-18 NOTE — ED Provider Notes (Signed)
[facility] DEPT Provider Note   CSN: [id number] Arrival date & time: 12/18/15  1116     History   Chief Complaint Chief Complaint  Patient presents with  . Shoulder Pain    HPI Leah Wiggins is a 82 y.o. female.  HPI 82 year old female who presents with left shoulder pain onset at 9:30-10 AM. States she was in bed. Lasted for less than 1 minute, and gradually resolved over the next few minutes. States that she has arthritis in her left shoulder and has had similar pain in the past, but this is been the most severe. She did not develop any pain that radiated into the chest, shortness of breath, diaphoresis, nausea or vomiting. Pain was worse with movement of her shoulder. It radiated up to the neck. Pain went away before she could take anything to relieve her pain. No fall or trauma. As a side note, she also states that she has had a rash on the back of her neck and over the dorsum of her right foot since June of this year. She has trialed course of steroids, antibiotics, and creams without improvement. Was recently seen by dermatologist for this and had the area biopsied. Started on topical ketoconazole and follow-up in 1-2 weeks to review results. This rash is not been worse than when he normally has. Normally does have drainage. Pruritic in nature and not tender.   Past Medical History:  Diagnosis Date  . Breast cancer ([place]) 1991  . Diabetes mellitus   . Hyperlipidemia   . Hypertension   . Non-stress test nonreactive, 04/24/12, normal 12/10/2012  . PVD (peripheral vascular disease) ([place])   . Tobacco use 12/10/2012    Patient Active Problem List   Diagnosis Date Noted  . History of breast cancer 08/14/2014  . Empty sella ([place]) 07/14/2014  . Pain in the chest 04/28/2014  . Diabetes mellitus without complication ([place]) 89/21/1939  . Chest pain 03/03/2014  . Essential hypertension 06/24/2013  . PAD (peripheral artery disease), abnormal ABIs 12/10/2012  . Non-stress test  nonreactive, 04/24/12, normal 12/10/2012  . Dyslipidemia 12/10/2012  . Tobacco use 12/10/2012  . Thrombocytopenia ([place]) 01/24/2012  . RESTLESS LEG SYNDROME 02/16/2010  . METHICILLIN SUSECPTIBLE PNEUMONIA STAPH AUREUS 01/13/2010  . SEPTIC ARTHRITIS 01/13/2010  . HIP THI LEG&ANK ABRASION/FRICION BURN W/O INF 01/13/2010    Past Surgical History:  Procedure Laterality Date  . CARDIOVASCULAR STRESS TEST  04/24/2012   No significant ST segment change suggestive of ischemia.  . LOWER EXTREMITY ARTERIAL DOPPLER Bilateral 05/07/2012   Left ABI-demonstrated mild arterial insufficiency. Right CIA 50-69% diameter reduction. Right SFA less than 50% diameter reduction. Left SFA 70-99% diameter reduction. Bilateral Runoff-Posterior tibial arteries appeared occluded.  Marland Kitchen MASTECTOMY PARTIAL / LUMPECTOMY W/ AXILLARY LYMPHADENECTOMY Right 1991   . TOTAL HIP ARTHROPLASTY Right 2011    OB History    No data available       Home Medications    Prior to Admission medications   Medication Sig Start Date End Date Taking? Authorizing Provider  alendronate (FOSAMAX) 70 MG tablet Take 70 mg by mouth every 7 (seven) days. Take with a full glass of water on an empty stomach. Wednesdays   Yes Historical Provider, MD  aspirin EC 81 MG tablet Take 81 mg by mouth daily.   Yes Historical Provider, MD  clotrimazole-betamethasone (LOTRISONE) cream Apply to affected area 2 times daily prn 10/31/15  Yes Janne Napoleon, NP  ketoconazole (NIZORAL) 2 % cream Apply 1 application topically 2 (two)  times daily. Irritation on feet 12/17/15  Yes Historical Provider, MD  metFORMIN (GLUCOPHAGE-XR) 500 MG 24 hr tablet Take 500 mg by mouth 2 (two) times daily.   Yes Historical Provider, MD  mometasone (ELOCON) 0.1 % lotion Apply 1 application topically daily as needed (IRRITATION).  04/15/14  Yes Historical Provider, MD  nicotine (NICODERM CQ - DOSED IN MG/24 HOURS) 21 mg/24hr patch Place 1 patch (21 mg total) onto the skin daily. Patient  taking differently: Place 21 mg onto the skin daily as needed (OCCASSIONALLY).  03/04/14  Yes Silver Huguenin Elgergawy, MD  nystatin (MYCOSTATIN/NYSTOP) powder Apply topically 2 (two) times daily. Patient taking differently: Apply 2 g topically 2 (two) times daily as needed (FOR IRRITATION).  10/31/15  Yes Janne Napoleon, NP  pantoprazole (PROTONIX) 40 MG tablet Take 1 tablet (40 mg total) by mouth daily at 12 noon. 03/04/14  Yes Albertine Patricia, MD  pravastatin (PRAVACHOL) 40 MG tablet Take 40 mg by mouth every evening. 11/10/15  Yes Historical Provider, MD  SPIRIVA HANDIHALER 18 MCG inhalation capsule Place 18 mcg into inhaler and inhale daily as needed (shortness of breath).  11/05/11  Yes Historical Provider, MD  valsartan (DIOVAN) 160 MG tablet Take 160 mg by mouth daily.     Yes Historical Provider, MD  HYDROcodone-acetaminophen (NORCO) 5-325 MG per tablet Take 1 tablet by mouth every 4 (four) hours as needed for moderate pain. Patient not taking: Reported on 12/18/2015 12/19/13   Liam Graham, PA-C    Family History Family History  Problem Relation Age of Onset  . Heart disease Mother   . Stroke Mother   . Stroke Father   . Cancer Sister     GIST  . Cancer Brother     thyroid   . Diabetes Son   . Stroke Son     Social History Social History  Substance Use Topics  . Smoking status: Current Some Day Smoker    Packs/day: 0.25    Years: 65.00    Types: Cigarettes    Start date: 03/08/1947  . Smokeless tobacco: Never Used     Comment: using nicoderm patches to quit smoking  . Alcohol use No     Allergies   Crestor [rosuvastatin calcium]; Pravastatin; and Penicillins   Review of Systems Review of Systems 10/14 systems reviewed and are negative other than those stated in the HPI   Physical Exam Updated Vital Signs BP 158/63   Pulse (!) 59   Temp 98.2 F (36.8 C) (Oral)   Resp 19   Ht '5\' 3"'$  (1.6 m)   Wt 165 lb (74.8 kg)   SpO2 100%   BMI 29.23 kg/m   Physical  Exam Physical Exam  Nursing note and vitals reviewed. Constitutional: Well developed, well nourished, non-toxic, and in no acute distress Head: Normocephalic and atraumatic.  Mouth/Throat: Oropharynx is clear and moist.  Neck: Normal range of motion. Neck supple.  Cardiovascular: Normal rate and regular rhythm.   Pulmonary/Chest: Effort normal and breath sounds normal.  Abdominal: Soft. There is no tenderness. There is no rebound and no guarding.  Musculoskeletal: Normal range of motion.  Neurological: Alert, no facial droop, fluent speech, moves all extremities symmetrically Skin: Skin is warm and dry. Erythematous dorsum of the foot with no active drainage or significant induration. Similar erythema and dryness of skin over nape of the neck and over bilateral trapezius areas of the shoulder. No current drainage, blistering.  Psychiatric: Cooperative   ED Treatments / Results  Labs (all labs ordered are listed, but only abnormal results are displayed) Labs Reviewed  BASIC METABOLIC PANEL - Abnormal; Notable for the following:       Result Value   Glucose, Bld 132 (*)    GFR calc non Af Amer 58 (*)    All other components within normal limits  CBC - Abnormal; Notable for the following:    Hemoglobin 11.7 (*)    HCT 35.8 (*)    MCV 77.0 (*)    MCH 25.2 (*)    RDW 17.7 (*)    Platelets 95 (*)    All other components within normal limits  I-STAT TROPOININ, ED  I-STAT TROPOININ, ED    EKG  EKG Interpretation  Date/Time:  Friday December 18 2015 11:37:31 EDT Ventricular Rate:  75 PR Interval:  152 QRS Duration: 70 QT Interval:  360 QTC Calculation: 402 R Axis:   42 Text Interpretation:  Normal sinus rhythm Normal ECG no acute changes since last EKG  Confirmed by Kanna Dafoe MD, Olivette Beckmann [id number]) on 12/18/2015 4:44:06 PM       Radiology Dg Chest 2 View  Result Date: 12/18/2015 CLINICAL DATA:  Red rash all over body for 1 month. EXAM: CHEST  2 VIEW COMPARISON:  03/03/2014 FINDINGS:  The lungs are clear wiithout focal pneumonia, edema, pneumothorax or pleural effusion. Interstitial markings are diffusely coarsened with chronic features. Hyperexpansion is consistent with emphysema. Cardiopericardial silhouette is at upper limits of normal for size. Advanced degenerative changes noted in the left shoulder. IMPRESSION: Stable. Chronic interstitial changes without acute cardiopulmonary findings. Electronically Signed   By: Misty Stanley M.D.   On: 12/18/2015 12:18    Procedures Procedures (including critical care time)  Medications Ordered in ED Medications - No data to display   Initial Impression / Assessment and Plan / ED Course  I have reviewed the triage vital signs and the nursing notes.  Pertinent labs & imaging results that were available during my care of the patient were reviewed by me and considered in my medical decision making (see chart for details).  Clinical Course    82 year old female with history of hypertension, hyperlipidemia, and diabetes who presents with left shoulder pain lasting  under 1 minute that self resolved. She is well-appearing no acute distress. A symptomatic here in the emergency department. With unremarkable cardiopulmonary exam, and left upper extremity neurovascularly intact without evidence of trauma. Her pain seems very atypical for that ACS but given some of her risk factors serial enzymes were performed and negative and repeat EKG is normal and without anemic changes. This is similar pain to that of her arthritis, worse with movement of her left shoulder, and lasting under 1 minute. No concern for dissection, PE or other serious intrathoracic etiologies of her pain.  I do not feel that she requires admission for cardiac rule out at this time given that this history is typical for that of arthritis. In regards to her rash this is been ongoing since June, and has not been worsening. She now has appropriate follow-up with her dermatologist  who was recently biopsied this rash. She has trialed antibiotics and steroids and is not improved, and I do not feel that she requires any current treatment from me. She will continue her outpatient ketoconazole cream in the interim and follow up closely with her dermatologist. Area is not concerning for that of shingles given the bilateral nature, no blistering, and chronic nature of her rash.  The patient appears reasonably screened  and/or stabilized for discharge and I doubt any other medical condition or other [facility] requiring further screening, evaluation, or treatment in the ED at this time prior to discharge.  Strict return and follow-up instructions reviewed. She expressed understanding of all discharge instructions and felt comfortable with the plan of care.     Final Clinical Impressions(s) / ED Diagnoses   Final diagnoses:  Acute pain of left shoulder    New Prescriptions New Prescriptions   No medications on file     Forde Dandy, MD 12/18/15 2031

## 2015-12-21 DIAGNOSIS — Z1231 Encounter for screening mammogram for malignant neoplasm of breast: Secondary | ICD-10-CM | POA: Diagnosis not present

## 2015-12-21 DIAGNOSIS — Z853 Personal history of malignant neoplasm of breast: Secondary | ICD-10-CM | POA: Diagnosis not present

## 2015-12-31 DIAGNOSIS — L309 Dermatitis, unspecified: Secondary | ICD-10-CM | POA: Diagnosis not present

## 2016-01-01 DIAGNOSIS — L309 Dermatitis, unspecified: Secondary | ICD-10-CM | POA: Diagnosis not present

## 2016-01-18 DIAGNOSIS — H903 Sensorineural hearing loss, bilateral: Secondary | ICD-10-CM | POA: Diagnosis not present

## 2016-01-18 DIAGNOSIS — H60393 Other infective otitis externa, bilateral: Secondary | ICD-10-CM | POA: Diagnosis not present

## 2016-01-18 DIAGNOSIS — H608X3 Other otitis externa, bilateral: Secondary | ICD-10-CM | POA: Diagnosis not present

## 2016-01-18 DIAGNOSIS — E119 Type 2 diabetes mellitus without complications: Secondary | ICD-10-CM | POA: Diagnosis not present

## 2016-02-01 DIAGNOSIS — L84 Corns and callosities: Secondary | ICD-10-CM | POA: Diagnosis not present

## 2016-02-01 DIAGNOSIS — I739 Peripheral vascular disease, unspecified: Secondary | ICD-10-CM | POA: Diagnosis not present

## 2016-02-01 DIAGNOSIS — E1151 Type 2 diabetes mellitus with diabetic peripheral angiopathy without gangrene: Secondary | ICD-10-CM | POA: Diagnosis not present

## 2016-02-01 DIAGNOSIS — L602 Onychogryphosis: Secondary | ICD-10-CM | POA: Diagnosis not present

## 2016-02-11 DIAGNOSIS — L300 Nummular dermatitis: Secondary | ICD-10-CM | POA: Diagnosis not present

## 2016-02-16 DIAGNOSIS — M19012 Primary osteoarthritis, left shoulder: Secondary | ICD-10-CM | POA: Diagnosis not present

## 2016-04-01 DIAGNOSIS — L602 Onychogryphosis: Secondary | ICD-10-CM | POA: Diagnosis not present

## 2016-04-01 DIAGNOSIS — E1151 Type 2 diabetes mellitus with diabetic peripheral angiopathy without gangrene: Secondary | ICD-10-CM | POA: Diagnosis not present

## 2016-04-01 DIAGNOSIS — I739 Peripheral vascular disease, unspecified: Secondary | ICD-10-CM | POA: Diagnosis not present

## 2016-05-02 DIAGNOSIS — L300 Nummular dermatitis: Secondary | ICD-10-CM | POA: Diagnosis not present

## 2016-05-02 DIAGNOSIS — L309 Dermatitis, unspecified: Secondary | ICD-10-CM | POA: Diagnosis not present

## 2016-05-03 DIAGNOSIS — E1122 Type 2 diabetes mellitus with diabetic chronic kidney disease: Secondary | ICD-10-CM | POA: Diagnosis not present

## 2016-05-03 DIAGNOSIS — R109 Unspecified abdominal pain: Secondary | ICD-10-CM | POA: Diagnosis not present

## 2016-05-03 DIAGNOSIS — R3121 Asymptomatic microscopic hematuria: Secondary | ICD-10-CM | POA: Diagnosis not present

## 2016-05-03 DIAGNOSIS — N182 Chronic kidney disease, stage 2 (mild): Secondary | ICD-10-CM | POA: Diagnosis not present

## 2016-05-04 ENCOUNTER — Ambulatory Visit
Admission: RE | Admit: 2016-05-04 | Discharge: 2016-05-04 | Disposition: A | Discharge status: Home / Self Care | Payer: Medicare Other | Source: Ambulatory Visit | Attending: Internal Medicine | Admitting: Internal Medicine

## 2016-05-04 ENCOUNTER — Other Ambulatory Visit: Payer: Self-pay | Admitting: Internal Medicine

## 2016-05-04 DIAGNOSIS — R05 Cough: Secondary | ICD-10-CM

## 2016-05-04 DIAGNOSIS — R059 Cough, unspecified: Secondary | ICD-10-CM

## 2016-05-30 DIAGNOSIS — L309 Dermatitis, unspecified: Secondary | ICD-10-CM | POA: Diagnosis not present

## 2016-06-06 DIAGNOSIS — Z0182 Encounter for allergy testing: Secondary | ICD-10-CM | POA: Diagnosis not present

## 2016-06-06 DIAGNOSIS — L239 Allergic contact dermatitis, unspecified cause: Secondary | ICD-10-CM | POA: Diagnosis not present

## 2016-06-10 DIAGNOSIS — Z0182 Encounter for allergy testing: Secondary | ICD-10-CM | POA: Diagnosis not present

## 2016-06-10 DIAGNOSIS — L309 Dermatitis, unspecified: Secondary | ICD-10-CM | POA: Diagnosis not present

## 2016-06-10 DIAGNOSIS — L259 Unspecified contact dermatitis, unspecified cause: Secondary | ICD-10-CM | POA: Diagnosis not present

## 2016-06-24 ENCOUNTER — Ambulatory Visit: Payer: Medicare Other | Admitting: Cardiovascular Disease

## 2016-06-27 DIAGNOSIS — L84 Corns and callosities: Secondary | ICD-10-CM | POA: Diagnosis not present

## 2016-06-27 DIAGNOSIS — E1351 Other specified diabetes mellitus with diabetic peripheral angiopathy without gangrene: Secondary | ICD-10-CM | POA: Diagnosis not present

## 2016-06-27 DIAGNOSIS — L602 Onychogryphosis: Secondary | ICD-10-CM | POA: Diagnosis not present

## 2016-06-28 ENCOUNTER — Encounter: Payer: Self-pay | Admitting: Cardiovascular Disease

## 2016-06-28 ENCOUNTER — Encounter: Payer: Self-pay | Admitting: *Deleted

## 2016-06-28 ENCOUNTER — Ambulatory Visit (INDEPENDENT_AMBULATORY_CARE_PROVIDER_SITE_OTHER): Payer: Medicare Other | Admitting: Cardiovascular Disease

## 2016-06-28 VITALS — BP 140/60 | HR 82 | Ht 63.0 in | Wt 160.2 lb

## 2016-06-28 DIAGNOSIS — E785 Hyperlipidemia, unspecified: Secondary | ICD-10-CM | POA: Diagnosis not present

## 2016-06-28 DIAGNOSIS — I10 Essential (primary) hypertension: Secondary | ICD-10-CM

## 2016-06-28 DIAGNOSIS — I739 Peripheral vascular disease, unspecified: Secondary | ICD-10-CM

## 2016-06-28 NOTE — Progress Notes (Signed)
06/28/2016 Leah Wiggins   07-24-1926  [id number]  Primary Physician Maximino Greenland, MD Primary Cardiologist: Lorretta Harp MD Renae Gloss  HPI:  Leah Wiggins is an 83 year old mildly overweight widowed African American female, mother of 27, grandmother of greater than 49 grandchildren. She is accompanied by her daughter [name] today. I last saw her in the office 06/24/15.Marland Kitchen Her risk factors include recently discontinued tobacco abuse having smoked for many years and currently wearing a nicotine patch., contrary, hypertension, mild hyperlipidemia, and non-insulin-requiring diabetes. She also has peripheral vascular occlusive disease with Dopplers that show a right ABI of .64 with a high-frequency signal in the origin of the right common iliac artery I saw this progression of disease in the distal right SFA., left ABI of 0.84 with a high-frequency signal in the origin of the left SFA, though she really denies claudication. She had a negative Myoview performed 04/24/12 and more recently 04/29/14 for atypical chest pain. Since I saw her in the office a year ago she has remained asymptomatic facility denying chest pain or shortness of breath.   Current Outpatient Prescriptions  Medication Sig Dispense Refill  . alendronate (FOSAMAX) 70 MG tablet Take 70 mg by mouth every 7 (seven) days. Take with a full glass of water on an empty stomach. Wednesdays    . aspirin EC 81 MG tablet Take 81 mg by mouth daily.    . clotrimazole-betamethasone (LOTRISONE) cream Apply to affected area 2 times daily prn 30 g 0  . HYDROcodone-acetaminophen (NORCO) 5-325 MG per tablet Take 1 tablet by mouth every 4 (four) hours as needed for moderate pain. 20 tablet 0  . ketoconazole (NIZORAL) 2 % cream Apply 1 application topically 2 (two) times daily. Irritation on feet    . metFORMIN (GLUCOPHAGE-XR) 500 MG 24 hr tablet Take 500 mg by mouth 2 (two) times daily.    . mometasone (ELOCON) 0.1 % lotion Apply 1  application topically daily as needed (IRRITATION).   0  . pantoprazole (PROTONIX) 40 MG tablet Take 1 tablet (40 mg total) by mouth daily at 12 noon. 30 tablet 0  . pravastatin (PRAVACHOL) 40 MG tablet Take 40 mg by mouth every evening.    Marland Kitchen SPIRIVA HANDIHALER 18 MCG inhalation capsule Place 18 mcg into inhaler and inhale daily as needed (shortness of breath).     . valsartan (DIOVAN) 160 MG tablet Take 160 mg by mouth daily.       No current facility-administered medications for this visit.     Allergies  Allergen Reactions  . Crestor [Rosuvastatin Calcium] Other (See Comments)    myalgias  . Pravastatin Other (See Comments)    Myalgias and weakness  . Penicillins Other (See Comments)    unknown    Social History   Social History  . Marital status: Widowed    Spouse name: N/A  . Number of children: N/A  . Years of education: N/A   Occupational History  . Not on file.   Social History Main Topics  . Smoking status: Current Some Day Smoker    Packs/day: 0.25    Years: 65.00    Types: Cigarettes    Start date: 03/08/1947  . Smokeless tobacco: Never Used     Comment: using nicoderm patches to quit smoking  . Alcohol use No  . Drug use: No  . Sexual activity: No   Other Topics Concern  . Not on file   Social History Narrative  .  No narrative on file     Review of Systems: General: negative for chills, fever, night sweats or weight changes.  Cardiovascular: negative for chest pain, dyspnea on exertion, edema, orthopnea, palpitations, paroxysmal nocturnal dyspnea or shortness of breath Dermatological: negative for rash Respiratory: negative for cough or wheezing Urologic: negative for hematuria Abdominal: negative for nausea, vomiting, diarrhea, bright red blood per rectum, melena, or hematemesis Neurologic: negative for visual changes, syncope, or dizziness All other systems reviewed and are otherwise negative except as noted above.    Blood pressure 140/60,  pulse 82, height '5\' 3"'$  (1.6 m), weight 160 lb 3.2 oz (72.7 kg).  General appearance: alert and no distress Neck: no adenopathy, no carotid bruit, no JVD, supple, symmetrical, trachea midline and thyroid not enlarged, symmetric, no tenderness/mass/nodules Lungs: clear to auscultation bilaterally Heart: regular rate and rhythm, S1, S2 normal, no murmur, click, rub or gallop Extremities: extremities normal, atraumatic, no cyanosis or edema  EKG normal sinus rhythm at 82 with no ST or T-wave changes. I personally reviewed this EKG  ASSESSMENT AND PLAN:   PAD (peripheral artery disease), abnormal ABIs History of peripheral arterial disease with lower extremity arterial Doppler studies performed several years ago showing a right ABI 0.64 with a high-frequency signal the origin of her right common iliac artery as well as her distal right SFA. Her left is ABI was 0.84 high-frequency signal in the origin of the left SFA. She really denies claudication.  Dyslipidemia History of dyslipidemia on statin therapy followed by her PCP  Essential hypertension History of hypertension blood pressure measures 140/60. She is on valsartan. Continue current meds at current dosing.      Lorretta Harp MD FACP,FACC,FAHA, [facility] 06/28/2016 3:07 PM

## 2016-06-28 NOTE — Assessment & Plan Note (Addendum)
History of hypertension blood pressure measures 140/60. She is on valsartan. Continue current meds at current dosing.

## 2016-06-28 NOTE — Assessment & Plan Note (Signed)
History of peripheral arterial disease with lower extremity arterial Doppler studies performed several years ago showing a right ABI 0.64 with a high-frequency signal the origin of her right common iliac artery as well as her distal right SFA. Her left is ABI was 0.84 high-frequency signal in the origin of the left SFA. She really denies claudication.

## 2016-06-28 NOTE — Assessment & Plan Note (Signed)
History of dyslipidemia on statin therapy followed by her PCP 

## 2016-06-28 NOTE — Patient Instructions (Signed)
Your physician wants you to follow-up in: ONE YEAR WITH DR BERRY You will receive a reminder letter in the mail two months in advance. If you don't receive a letter, please call our office to schedule the follow-up appointment.   If you need a refill on your cardiac medications before your next appointment, please call your pharmacy.  

## 2016-07-18 DIAGNOSIS — L300 Nummular dermatitis: Secondary | ICD-10-CM | POA: Diagnosis not present

## 2016-08-08 DIAGNOSIS — E119 Type 2 diabetes mellitus without complications: Secondary | ICD-10-CM | POA: Diagnosis not present

## 2016-08-08 DIAGNOSIS — Z961 Presence of intraocular lens: Secondary | ICD-10-CM | POA: Diagnosis not present

## 2016-08-09 ENCOUNTER — Encounter (HOSPITAL_COMMUNITY): Payer: Self-pay | Admitting: Family Medicine

## 2016-08-09 ENCOUNTER — Ambulatory Visit (HOSPITAL_COMMUNITY)
Admission: EM | Admit: 2016-08-09 | Discharge: 2016-08-09 | Disposition: A | Discharge status: Home / Self Care | Payer: Medicare Other | Attending: Internal Medicine | Admitting: Internal Medicine

## 2016-08-09 DIAGNOSIS — L03115 Cellulitis of right lower limb: Secondary | ICD-10-CM

## 2016-08-09 DIAGNOSIS — L03116 Cellulitis of left lower limb: Secondary | ICD-10-CM | POA: Diagnosis not present

## 2016-08-09 DIAGNOSIS — R21 Rash and other nonspecific skin eruption: Secondary | ICD-10-CM

## 2016-08-09 MED ORDER — DEXAMETHASONE SODIUM PHOSPHATE 10 MG/ML IJ SOLN
INTRAMUSCULAR | Status: AC
  Filled 2016-08-09: qty 1

## 2016-08-09 MED ORDER — HYDROXYZINE HCL 25 MG PO TABS: 25.0000 mg | ORAL_TABLET | Freq: Four times a day (QID) | ORAL | 0 refills | Status: DC

## 2016-08-09 MED ORDER — METHYLPREDNISOLONE 4 MG PO TBPK: ORAL_TABLET | ORAL | 0 refills | Status: DC

## 2016-08-09 MED ORDER — DOXYCYCLINE HYCLATE 100 MG PO CAPS: 100.0000 mg | ORAL_CAPSULE | Freq: Two times a day (BID) | ORAL | 0 refills | Status: DC

## 2016-08-09 MED ORDER — DEXAMETHASONE SODIUM PHOSPHATE 10 MG/ML IJ SOLN
10.0000 mg | Freq: Once | INTRAMUSCULAR | Status: AC
  Administered 2016-08-09: 10 mg via INTRAMUSCULAR

## 2016-08-09 NOTE — ED Provider Notes (Signed)
CSN: [id number]     Arrival date & time 08/09/16  1640 History   None    Chief Complaint  Patient presents with  . Rash   (Consider location/radiation/quality/duration/timing/severity/associated sxs/prior Treatment) Patient c/o rash to bilateral arms and feet.     The history is provided by the patient.  Rash  Location:  Foot and shoulder/arm Shoulder/arm rash location:  L arm and R arm Foot rash location:  L foot and R foot Quality: blistering, itchiness, redness and weeping   Onset quality:  Sudden Duration:  1 month Timing:  Constant Chronicity:  New Relieved by:  Nothing Worsened by:  Nothing Ineffective treatments:  None tried Associated symptoms: joint pain     Past Medical History:  Diagnosis Date  . Breast cancer ([place]) 1991  . Diabetes mellitus   . Hyperlipidemia   . Hypertension   . Non-stress test nonreactive, 04/24/12, normal 12/10/2012  . PVD (peripheral vascular disease) ([place])   . Tobacco use 12/10/2012   Past Surgical History:  Procedure Laterality Date  . CARDIOVASCULAR STRESS TEST  04/24/2012   No significant ST segment change suggestive of ischemia.  . LOWER EXTREMITY ARTERIAL DOPPLER Bilateral 05/07/2012   Left ABI-demonstrated mild arterial insufficiency. Right CIA 50-69% diameter reduction. Right SFA less than 50% diameter reduction. Left SFA 70-99% diameter reduction. Bilateral Runoff-Posterior tibial arteries appeared occluded.  Marland Kitchen MASTECTOMY PARTIAL / LUMPECTOMY W/ AXILLARY LYMPHADENECTOMY Right 1991   . TOTAL HIP ARTHROPLASTY Right 2011   Family History  Problem Relation Age of Onset  . Heart disease Mother   . Stroke Mother   . Stroke Father   . Cancer Sister        GIST  . Cancer Brother        thyroid   . Diabetes Son   . Stroke Son    Social History  Substance Use Topics  . Smoking status: Current Some Day Smoker    Packs/day: 0.25    Years: 65.00    Types: Cigarettes    Start date: 03/08/1947  . Smokeless tobacco: Never Used   Comment: using nicoderm patches to quit smoking  . Alcohol use No   OB History    No data available     Review of Systems  Constitutional: Negative.   HENT: Negative.   Eyes: Negative.   Respiratory: Negative.   Cardiovascular: Negative.   Gastrointestinal: Negative.   Endocrine: Negative.   Genitourinary: Negative.   Musculoskeletal: Positive for arthralgias.  Skin: Positive for rash.  Allergic/Immunologic: Negative.   Neurological: Negative.   Hematological: Negative.   Psychiatric/Behavioral: Negative.     Allergies  Crestor [rosuvastatin calcium]; Pravastatin; and Penicillins  Home Medications   Prior to Admission medications   Medication Sig Start Date End Date Taking? Authorizing Provider  alendronate (FOSAMAX) 70 MG tablet Take 70 mg by mouth every 7 (seven) days. Take with a full glass of water on an empty stomach. Wednesdays    [provider]  aspirin EC 81 MG tablet Take 81 mg by mouth daily.    [provider]  clotrimazole-betamethasone (LOTRISONE) cream Apply to affected area 2 times daily prn 10/31/15   Janne Napoleon, NP  doxycycline (VIBRAMYCIN) 100 MG capsule Take 1 capsule (100 mg total) by mouth 2 (two) times daily. 08/09/16   Lysbeth Penner, FNP  HYDROcodone-acetaminophen (NORCO) 5-325 MG per tablet Take 1 tablet by mouth every 4 (four) hours as needed for moderate pain. 12/19/13   Liam Graham, PA-C  hydrOXYzine (  ATARAX/VISTARIL) 25 MG tablet Take 1 tablet (25 mg total) by mouth every 6 (six) hours. 08/09/16   Lysbeth Penner, FNP  ketoconazole (NIZORAL) 2 % cream Apply 1 application topically 2 (two) times daily. Irritation on feet 12/17/15   [provider]  metFORMIN (GLUCOPHAGE-XR) 500 MG 24 hr tablet Take 500 mg by mouth 2 (two) times daily.    [provider]  methylPREDNISolone (MEDROL DOSEPAK) 4 MG TBPK tablet Take 6-5-4-3-2-1 po qd 08/09/16   Lysbeth Penner, FNP  mometasone (ELOCON) 0.1 % lotion Apply 1  application topically daily as needed (IRRITATION).  04/15/14   [provider]  pantoprazole (PROTONIX) 40 MG tablet Take 1 tablet (40 mg total) by mouth daily at 12 noon. 03/04/14   Elgergawy, Silver Huguenin, MD  pravastatin (PRAVACHOL) 40 MG tablet Take 40 mg by mouth every evening. 11/10/15   [provider]  SPIRIVA HANDIHALER 18 MCG inhalation capsule Place 18 mcg into inhaler and inhale daily as needed (shortness of breath).  11/05/11   [provider]  valsartan (DIOVAN) 160 MG tablet Take 160 mg by mouth daily.      [provider]   Meds Ordered and Administered this Visit   Medications  dexamethasone (DECADRON) injection 10 mg (10 mg Intramuscular Given 08/09/16 1820)    BP (!) 141/100   Pulse 75   Temp 98.2 F (36.8 C)   Resp 16   SpO2 98%  No data found.   Physical Exam  Constitutional: She appears well-developed and well-nourished.  HENT:  Head: Normocephalic and atraumatic.  Mouth/Throat: Oropharynx is clear and moist.  Eyes: Conjunctivae and EOM are normal. Pupils are equal, round, and reactive to light.  Neck: Normal range of motion. Neck supple.  Cardiovascular: Normal rate, regular rhythm and normal heart sounds.   Pulmonary/Chest: Effort normal and breath sounds normal.  Abdominal: Soft. Bowel sounds are normal.  Skin: Rash noted.  Erythematous raised rash bilateral feet and arms.  Right foot with cracking and clear drainage oozing from 2nd and 3rd toes  Nursing note and vitals reviewed.   Urgent Care Course     Procedures (including critical care time)  Labs Review Labs Reviewed - No data to display  Imaging Review No results found.   Visual Acuity Review  Right Eye Distance:   Left Eye Distance:   Bilateral Distance:    Right Eye Near:   Left Eye Near:    Bilateral Near:         MDM   1. Rash and nonspecific skin eruption   2. Cellulitis of both feet    Dexamethasone 10mg  IM Medrol Dose Pack as Directed  4mg  #21 Hydroxyzine 25mg  one po tid prn #21 Doxycycline 100mg  one po bid x10 days #20      Lysbeth Penner, FNP 08/09/16 1844    Lysbeth Penner, FNP 08/09/16 [phone]

## 2016-08-09 NOTE — ED Triage Notes (Signed)
Pt here for rash to left arm for months.

## 2016-08-15 DIAGNOSIS — L308 Other specified dermatitis: Secondary | ICD-10-CM | POA: Diagnosis not present

## 2016-08-25 DIAGNOSIS — H903 Sensorineural hearing loss, bilateral: Secondary | ICD-10-CM | POA: Diagnosis not present

## 2016-08-25 DIAGNOSIS — H60393 Other infective otitis externa, bilateral: Secondary | ICD-10-CM | POA: Diagnosis not present

## 2016-08-25 DIAGNOSIS — E119 Type 2 diabetes mellitus without complications: Secondary | ICD-10-CM | POA: Diagnosis not present

## 2016-08-25 DIAGNOSIS — H608X3 Other otitis externa, bilateral: Secondary | ICD-10-CM | POA: Diagnosis not present

## 2016-08-30 DIAGNOSIS — I739 Peripheral vascular disease, unspecified: Secondary | ICD-10-CM | POA: Diagnosis not present

## 2016-08-30 DIAGNOSIS — E1351 Other specified diabetes mellitus with diabetic peripheral angiopathy without gangrene: Secondary | ICD-10-CM | POA: Diagnosis not present

## 2016-08-30 DIAGNOSIS — M79672 Pain in left foot: Secondary | ICD-10-CM | POA: Diagnosis not present

## 2016-08-30 DIAGNOSIS — M79671 Pain in right foot: Secondary | ICD-10-CM | POA: Diagnosis not present

## 2016-08-30 DIAGNOSIS — B351 Tinea unguium: Secondary | ICD-10-CM | POA: Diagnosis not present

## 2016-08-31 DIAGNOSIS — E1122 Type 2 diabetes mellitus with diabetic chronic kidney disease: Secondary | ICD-10-CM | POA: Diagnosis not present

## 2016-08-31 DIAGNOSIS — N182 Chronic kidney disease, stage 2 (mild): Secondary | ICD-10-CM | POA: Diagnosis not present

## 2016-08-31 DIAGNOSIS — Z1389 Encounter for screening for other disorder: Secondary | ICD-10-CM | POA: Diagnosis not present

## 2016-08-31 DIAGNOSIS — I129 Hypertensive chronic kidney disease with stage 1 through stage 4 chronic kidney disease, or unspecified chronic kidney disease: Secondary | ICD-10-CM | POA: Diagnosis not present

## 2016-08-31 DIAGNOSIS — N08 Glomerular disorders in diseases classified elsewhere: Secondary | ICD-10-CM | POA: Diagnosis not present

## 2016-09-07 ENCOUNTER — Encounter (HOSPITAL_COMMUNITY): Payer: Self-pay

## 2016-09-07 ENCOUNTER — Observation Stay (HOSPITAL_COMMUNITY)
Admission: EM | Admit: 2016-09-07 | Discharge: 2016-09-08 | Disposition: A | Discharge status: Home / Self Care | Payer: Medicare Other | Attending: Internal Medicine | Admitting: Internal Medicine

## 2016-09-07 ENCOUNTER — Observation Stay (HOSPITAL_COMMUNITY): Payer: Medicare Other

## 2016-09-07 ENCOUNTER — Emergency Department (HOSPITAL_COMMUNITY): Payer: Medicare Other

## 2016-09-07 DIAGNOSIS — Z79899 Other long term (current) drug therapy: Secondary | ICD-10-CM | POA: Insufficient documentation

## 2016-09-07 DIAGNOSIS — R0789 Other chest pain: Principal | ICD-10-CM | POA: Insufficient documentation

## 2016-09-07 DIAGNOSIS — D696 Thrombocytopenia, unspecified: Secondary | ICD-10-CM | POA: Diagnosis not present

## 2016-09-07 DIAGNOSIS — R7989 Other specified abnormal findings of blood chemistry: Secondary | ICD-10-CM | POA: Diagnosis not present

## 2016-09-07 DIAGNOSIS — I739 Peripheral vascular disease, unspecified: Secondary | ICD-10-CM | POA: Diagnosis not present

## 2016-09-07 DIAGNOSIS — E785 Hyperlipidemia, unspecified: Secondary | ICD-10-CM | POA: Diagnosis present

## 2016-09-07 DIAGNOSIS — E119 Type 2 diabetes mellitus without complications: Secondary | ICD-10-CM | POA: Diagnosis not present

## 2016-09-07 DIAGNOSIS — R079 Chest pain, unspecified: Secondary | ICD-10-CM | POA: Diagnosis not present

## 2016-09-07 DIAGNOSIS — R748 Abnormal levels of other serum enzymes: Secondary | ICD-10-CM

## 2016-09-07 DIAGNOSIS — F17210 Nicotine dependence, cigarettes, uncomplicated: Secondary | ICD-10-CM | POA: Insufficient documentation

## 2016-09-07 DIAGNOSIS — K861 Other chronic pancreatitis: Secondary | ICD-10-CM | POA: Diagnosis not present

## 2016-09-07 DIAGNOSIS — K859 Acute pancreatitis without necrosis or infection, unspecified: Secondary | ICD-10-CM | POA: Diagnosis not present

## 2016-09-07 DIAGNOSIS — R0602 Shortness of breath: Secondary | ICD-10-CM | POA: Diagnosis not present

## 2016-09-07 DIAGNOSIS — I10 Essential (primary) hypertension: Secondary | ICD-10-CM | POA: Diagnosis not present

## 2016-09-07 LAB — HEPATIC FUNCTION PANEL
ALT: 17 U/L (ref 14–54)
AST: 21 U/L (ref 15–41)
Albumin: 3.2 g/dL — ABNORMAL LOW (ref 3.5–5.0)
Alkaline Phosphatase: 60 U/L (ref 38–126)
Bilirubin, Direct: <0.1 mg/dL — ABNORMAL LOW (ref 0.1–0.5)
Total Bilirubin: 0.5 mg/dL (ref 0.3–1.2)
Total Protein: 6.2 g/dL — ABNORMAL LOW (ref 6.5–8.1)

## 2016-09-07 LAB — BASIC METABOLIC PANEL
Anion gap: 9 (ref 5–15)
BUN: 14 mg/dL (ref 6–20)
CO2: 25 mmol/L (ref 22–32)
Calcium: 9.2 mg/dL (ref 8.9–10.3)
Chloride: 106 mmol/L (ref 101–111)
Creatinine, Ser: 1.01 mg/dL — ABNORMAL HIGH (ref 0.44–1.00)
GFR calc Af Amer: 56 mL/min — ABNORMAL LOW (ref >60)
GFR calc non Af Amer: 49 mL/min — ABNORMAL LOW (ref >60)
Glucose, Bld: 121 mg/dL — ABNORMAL HIGH (ref 65–99)
Potassium: 4.2 mmol/L (ref 3.5–5.1)
Sodium: 140 mmol/L (ref 135–145)

## 2016-09-07 LAB — CBC
HCT: 36.9 % (ref 36.0–46.0)
Hemoglobin: 11.9 g/dL — ABNORMAL LOW (ref 12.0–15.0)
MCH: 24.9 pg — ABNORMAL LOW (ref 26.0–34.0)
MCHC: 32.2 g/dL (ref 30.0–36.0)
MCV: 77.2 fL — ABNORMAL LOW (ref 78.0–100.0)
Platelets: 92 10*3/uL — ABNORMAL LOW (ref 150–400)
RBC: 4.78 MIL/uL (ref 3.87–5.11)
RDW: 17.2 % — ABNORMAL HIGH (ref 11.5–15.5)
WBC: 5.6 10*3/uL (ref 4.0–10.5)

## 2016-09-07 LAB — D-DIMER, QUANTITATIVE: D-Dimer, Quant: 3.08 ug/mL-FEU — ABNORMAL HIGH (ref 0.00–0.50)

## 2016-09-07 LAB — LIPASE, BLOOD: Lipase: 303 U/L — ABNORMAL HIGH (ref 11–51)

## 2016-09-07 LAB — TROPONIN I: Troponin I: <0.03 ng/mL (ref <0.03)

## 2016-09-07 LAB — I-STAT TROPONIN, ED: Troponin i, poc: 0.01 ng/mL (ref 0.00–0.08)

## 2016-09-07 MED ORDER — IOPAMIDOL (ISOVUE-370) INJECTION 76%
INTRAVENOUS | Status: AC
  Administered 2016-09-07: 80 mL
  Filled 2016-09-07: qty 100

## 2016-09-07 MED ORDER — ONDANSETRON HCL 4 MG/2ML IJ SOLN: 4.0000 mg | Freq: Four times a day (QID) | INTRAMUSCULAR | Status: DC | PRN

## 2016-09-07 MED ORDER — ASPIRIN 81 MG PO CHEW
324.0000 mg | CHEWABLE_TABLET | Freq: Once | ORAL | Status: AC
Start: 2016-09-07 — End: 2016-09-07
  Administered 2016-09-07: 324 mg via ORAL
  Filled 2016-09-07: qty 4

## 2016-09-07 MED ORDER — MORPHINE SULFATE (PF) 4 MG/ML IV SOLN
2.0000 mg | Freq: Once | INTRAVENOUS | Status: AC
  Administered 2016-09-07: 2 mg via INTRAVENOUS
  Filled 2016-09-07: qty 1

## 2016-09-07 MED ORDER — MORPHINE SULFATE (PF) 4 MG/ML IV SOLN
2.0000 mg | INTRAVENOUS | Status: DC | PRN
  Administered 2016-09-08: 2 mg via INTRAVENOUS
  Filled 2016-09-07: qty 1

## 2016-09-07 NOTE — ED Notes (Signed)
Meal tray at bedside.  

## 2016-09-07 NOTE — ED Notes (Signed)
Date and time results received: 09/07/16 2127 (use smartphrase ".now" to insert current time)  Test: D-dimer, lipase  Critical Value: D-dimer 3.08, lipase 303  Name of Provider Notified: Dr.Doutova   Orders Received? Or Actions Taken?: CT angio ordered; attempting to find better access for scan

## 2016-09-07 NOTE — ED Triage Notes (Signed)
Pt here with family. Reports right side chest pain radiating into her back that began today. Pt in no acute distress. Skin warm and dry, resp e/u.

## 2016-09-07 NOTE — ED Provider Notes (Signed)
[facility] DEPT Provider Note   CSN: [id number] Arrival date & time: 09/07/16  1736   By signing my name below, I, Soijett Blue, attest that this documentation has been prepared under the direction and in the presence of Liu, Eugenia Mcalpine, MD. Electronically Signed: Soijett Blue, ED Scribe. 09/07/16. 6:28 PM.  History   Chief Complaint Chief Complaint  Patient presents with  . Chest Pain    HPI Leah Wiggins is a 83 y.o. female with a PMHx of DM, HTN, breast CA, who presents to the Emergency Department complaining of sharp, right sided CP onset 2.5 hours ago. She notes that she was eating cake prior to the onset of her symptoms. Pt right sided CP is exacerbated with ambulation and movement without any alleviating factors. Pt has not tried any medications for the relief of her symptoms. Pt denies hx of similar symptoms. She denies nausea, vomiting, abdominal pain, SOB, diaphoresis, cough, fever, recent heavy lifting, overexertion, and any other symptoms.   The history is provided by the patient. No language interpreter was used.    Past Medical History:  Diagnosis Date  . Breast cancer ([place]) 1991  . Diabetes mellitus   . Hyperlipidemia   . Hypertension   . Non-stress test nonreactive, 04/24/12, normal 12/10/2012  . PVD (peripheral vascular disease) ([place])   . Tobacco use 12/10/2012    Patient Active Problem List   Diagnosis Date Noted  . History of breast cancer 08/14/2014  . Empty sella ([place]) 07/14/2014  . Pain in the chest 04/28/2014  . Diabetes mellitus without complication ([place]) 82/99/3716  . Chest pain 03/03/2014  . Essential hypertension 06/24/2013  . PAD (peripheral artery disease), abnormal ABIs 12/10/2012  . Non-stress test nonreactive, 04/24/12, normal 12/10/2012  . Dyslipidemia 12/10/2012  . Tobacco use 12/10/2012  . Thrombocytopenia ([place]) 01/24/2012  . RESTLESS LEG SYNDROME 02/16/2010  . METHICILLIN SUSECPTIBLE PNEUMONIA STAPH AUREUS 01/13/2010  . SEPTIC  ARTHRITIS 01/13/2010  . HIP THI LEG&ANK ABRASION/FRICION BURN W/O INF 01/13/2010    Past Surgical History:  Procedure Laterality Date  . CARDIOVASCULAR STRESS TEST  04/24/2012   No significant ST segment change suggestive of ischemia.  . LOWER EXTREMITY ARTERIAL DOPPLER Bilateral 05/07/2012   Left ABI-demonstrated mild arterial insufficiency. Right CIA 50-69% diameter reduction. Right SFA less than 50% diameter reduction. Left SFA 70-99% diameter reduction. Bilateral Runoff-Posterior tibial arteries appeared occluded.  Marland Kitchen MASTECTOMY PARTIAL / LUMPECTOMY W/ AXILLARY LYMPHADENECTOMY Right 1991   . TOTAL HIP ARTHROPLASTY Right 2011    OB History    No data available       Home Medications    Prior to Admission medications   Medication Sig Start Date End Date Taking? Authorizing Provider  alendronate (FOSAMAX) 70 MG tablet Take 70 mg by mouth every 7 (seven) days. Take with a full glass of water on an empty stomach. Wednesdays    [provider]  aspirin EC 81 MG tablet Take 81 mg by mouth daily.    [provider]  clotrimazole-betamethasone (LOTRISONE) cream Apply to affected area 2 times daily prn 10/31/15   Janne Napoleon, NP  doxycycline (VIBRAMYCIN) 100 MG capsule Take 1 capsule (100 mg total) by mouth 2 (two) times daily. 08/09/16   Lysbeth Penner, FNP  HYDROcodone-acetaminophen (NORCO) 5-325 MG per tablet Take 1 tablet by mouth every 4 (four) hours as needed for moderate pain. 12/19/13   Liam Graham, PA-C  hydrOXYzine (ATARAX/VISTARIL) 25 MG tablet Take 1 tablet (25 mg total) by mouth  every 6 (six) hours. 08/09/16   Lysbeth Penner, FNP  ketoconazole (NIZORAL) 2 % cream Apply 1 application topically 2 (two) times daily. Irritation on feet 12/17/15   [provider]  metFORMIN (GLUCOPHAGE-XR) 500 MG 24 hr tablet Take 500 mg by mouth 2 (two) times daily.    [provider]  methylPREDNISolone (MEDROL DOSEPAK) 4 MG TBPK tablet Take 6-5-4-3-2-1 po qd  08/09/16   Lysbeth Penner, FNP  mometasone (ELOCON) 0.1 % lotion Apply 1 application topically daily as needed (IRRITATION).  04/15/14   [provider]  pantoprazole (PROTONIX) 40 MG tablet Take 1 tablet (40 mg total) by mouth daily at 12 noon. 03/04/14   Elgergawy, Silver Huguenin, MD  pravastatin (PRAVACHOL) 40 MG tablet Take 40 mg by mouth every evening. 11/10/15   [provider]  SPIRIVA HANDIHALER 18 MCG inhalation capsule Place 18 mcg into inhaler and inhale daily as needed (shortness of breath).  11/05/11   [provider]  valsartan (DIOVAN) 160 MG tablet Take 160 mg by mouth daily.      [provider]    Family History Family History  Problem Relation Age of Onset  . Heart disease Mother   . Stroke Mother   . Stroke Father   . Cancer Sister        GIST  . Cancer Brother        thyroid   . Diabetes Son   . Stroke Son     Social History Social History  Substance Use Topics  . Smoking status: Current Some Day Smoker    Packs/day: 0.25    Years: 65.00    Types: Cigarettes    Start date: 03/08/1947  . Smokeless tobacco: Never Used     Comment: using nicoderm patches to quit smoking  . Alcohol use No     Allergies   Crestor [rosuvastatin calcium]; Pravastatin; and Penicillins   Review of Systems Review of Systems  Constitutional: Negative for fever.  Respiratory: Negative for cough and shortness of breath.   Cardiovascular: Positive for chest pain. Negative for leg swelling.  Gastrointestinal: Negative for abdominal pain.  Neurological: Negative for syncope and light-headedness.  All other systems reviewed and are negative.    Physical Exam Updated Vital Signs BP (!) 134/53 (BP Location: Left Arm)   Pulse 74   Temp 98.3 F (36.8 C) (Oral)   Resp 18   SpO2 99%   Physical Exam Physical Exam  Nursing note and vitals reviewed. Constitutional: Well developed, well nourished, non-toxic, and in no acute distress Head: Normocephalic  and atraumatic.  Mouth/Throat: Oropharynx is clear and moist.  Neck: Normal range of motion. Neck supple.  Cardiovascular: Normal rate and regular rhythm.   Pulmonary/Chest: Effort normal and breath sounds normal. No chest wall tenderness.  Abdominal: Soft. There is no tenderness. There is no rebound and no guarding.  Musculoskeletal: Normal range of motion.  Neurological: Alert, no facial droop, fluent speech, moves all extremities symmetrically Skin: Skin is warm and dry.  Psychiatric: Cooperative   ED Treatments / Results  DIAGNOSTIC STUDIES: Oxygen Saturation is 99% on RA, nl by my interpretation.    COORDINATION OF CARE: 6:24 PM Discussed treatment plan with pt at bedside which includes labs, EKG, CXR, and pt agreed to plan.   Labs (all labs ordered are listed, but only abnormal results are displayed) Labs Reviewed  BASIC METABOLIC PANEL - Abnormal; Notable for the following:       Result Value  Glucose, Bld 121 (*)    Creatinine, Ser 1.01 (*)    GFR calc non Af Amer 49 (*)    GFR calc Af Amer 56 (*)    All other components within normal limits  CBC - Abnormal; Notable for the following:    Hemoglobin 11.9 (*)    MCV 77.2 (*)    MCH 24.9 (*)    RDW 17.2 (*)    Platelets 92 (*)    All other components within normal limits  I-STAT TROPOININ, ED    EKG  EKG Interpretation  Date/Time:  Wednesday September 07 2016 17:39:59 EDT Ventricular Rate:  73 PR Interval:  164 QRS Duration: 64 QT Interval:  358 QTC Calculation: 394 R Axis:   45 Text Interpretation:  Normal sinus rhythm Nonspecific ST abnormality Abnormal ECG wandering baseline, technically difficult grossly no acte changes from prior  Confirmed by Brantley Stage [phone]) on 09/07/2016 6:05:25 PM       Radiology Dg Chest 2 View  Result Date: 09/07/2016 CLINICAL DATA:  Right-sided chest pain. Shortness breath. Symptoms for 3 hours. EXAM: CHEST  2 VIEW COMPARISON:  Radiograph 05/04/2016.  CT 04/28/2014 FINDINGS: The  cardiomediastinal contours are unchanged with atherosclerosis of the aortic arch. Stable hyperinflation and chronic bronchial thickening. Pulmonary vasculature is normal. No consolidation, pleural effusion, or pneumothorax. No acute osseous abnormalities are seen. Post right mastectomy with surgical clips in the axilla. Chronic deformity of the left proximal humerus per IMPRESSION: No acute abnormality. Chronic hyperinflation and bronchial thickening suggesting COPD. Thoracic aortic atherosclerosis. Electronically Signed   By: Jeb Levering M.D.   On: 09/07/2016 18:19    Procedures Procedures (including critical care time)  Medications Ordered in ED Medications  morphine 4 MG/ML injection 2 mg (2 mg Intravenous Given 09/07/16 1859)  aspirin chewable tablet 324 mg (324 mg Oral Given 09/07/16 1858)     Initial Impression / Assessment and Plan / ED Course  I have reviewed the triage vital signs and the nursing notes.  Pertinent labs & imaging results that were available during my care of the patient were reviewed by me and considered in my medical decision making (see chart for details).     83 year old female with history of PAD, HTN, HLD, DM who presents with chest pain. She is well appearing, in no acute distress. Vitals normal. EKG not acutely ischemic. CXR visualized and without acute cardiopulmonary processes. Troponin x 1 normal. History seem somewhat atypical for ACS, but overall heart score of 4. No concerns for dissection, PE, or other serious intrathoracic etiologies. Discussed with patient, who would like to be observed overnight for ACS rule out. Discussed with Dr. Roel Cluck.   Final Clinical Impressions(s) / ED Diagnoses   Final diagnoses:  Nonspecific chest pain    New Prescriptions New Prescriptions   No medications on file   I personally performed the services described in this documentation, which was scribed in my presence. The recorded information has been reviewed and  is accurate.     Forde Dandy, MD 09/07/16 [phone]

## 2016-09-07 NOTE — ED Notes (Signed)
Patient transported to X-ray 

## 2016-09-07 NOTE — ED Notes (Signed)
Pt taken to CT.

## 2016-09-07 NOTE — ED Notes (Signed)
Admitting at bedside 

## 2016-09-07 NOTE — ED Notes (Signed)
Regular dinner tray ordered

## 2016-09-07 NOTE — H&P (Addendum)
Leah Wiggins NWG:[id number] DOB: 06/03/1926 DOA: 09/07/2016     PCP: Dorothyann Peng, MD   Outpatient Specialists: Cardiology Allyson Sabal Patient coming from:    home Lives   With family   Chief Complaint: right sided chest pain  HPI: Leah Wiggins is a 83 y.o. female with medical history significant of breast Ca. PAD, HTN, HLD,DM2, tobacco abuse in remission,     Presented with right-sided chest pain started while eating cake but persisted radiating to her back not associated nausea vomiting diaphoresis or shortness of breath. It worse if she raises her right arm up. Not worse with palpation improves at rest not worse with deep breaths or cough. Feels like sharp pain going on for past 2.5 hours worse with movement and walking. Nothing seems to make it any better she did not try to use any medications she never had anything like this before no fevers or cough with her eyes doing any strenuous activities safely     Regarding pertinent Chronic problems: hx of PAD right ABI of .64 left ABI of 0.84  Hx of atypical Chest pain sp negative Myoview 04/24/12 and  04/29/14 Last echo in December 2015 showing EF 55-60 percent no evidence of diastolic dysfunction IN ER:  Temp (24hrs), Avg:98.3 F (36.8 C), Min:98.3 F (36.8 C), Max:98.3 F (36.8 C)      on arrival  ED Triage Vitals  Enc Vitals Group     BP 09/07/16 1746 (!) 134/53     Pulse Rate 09/07/16 1746 74     Resp 09/07/16 1746 18     Temp 09/07/16 1746 98.3 F (36.8 C)     Temp Source 09/07/16 1746 Oral     SpO2 09/07/16 1746 99 %     Weight --      Height --      Head Circumference --      Peak Flow --      Pain Score 09/07/16 1803 4     Pain Loc --      Pain Edu? --      Excl. in GC? --    RR 17 100% HR 60 BP 131/56 Trop 0.01 Na 140 K 4.2 cr 1.01 WBC 5.6 Hg 11.9 PLT 92 CXR non acute Following Medications were ordered in ER: Medications  morphine 4 MG/ML injection 2 mg (2 mg Intravenous Given 09/07/16 1859)  aspirin  chewable tablet 324 mg (324 mg Oral Given 09/07/16 1858)      Hospitalist was called for admission for atypical chest pain evaluation with negative troponin   Review of Systems:    Pertinent positives include: chest pain,  Constitutional:  No weight loss, night sweats, Fevers, chills, fatigue, weight loss  HEENT:  No headaches, Difficulty swallowing,Tooth/dental problems,Sore throat,  No sneezing, itching, ear ache, nasal congestion, post nasal drip,  Cardio-vascular:  No  Orthopnea, PND, anasarca, dizziness, palpitations.no Bilateral lower extremity swelling  GI:  No heartburn, indigestion, abdominal pain, nausea, vomiting, diarrhea, change in bowel habits, loss of appetite, melena, blood in stool, hematemesis Resp:  no shortness of breath at rest. No dyspnea on exertion, No excess mucus, no productive cough, No non-productive cough, No coughing up of blood.No change in color of mucus.No wheezing. Skin:  no rash or lesions. No jaundice GU:  no dysuria, change in color of urine, no urgency or frequency. No straining to urinate.  No flank pain.  Musculoskeletal:  No joint pain or no joint swelling. No decreased range of  motion. No back pain.  Psych:  No change in mood or affect. No depression or anxiety. No memory loss.  Neuro: no localizing neurological complaints, no tingling, no weakness, no double vision, no gait abnormality, no slurred speech, no confusion  As per HPI otherwise 10 point review of systems negative.   Past Medical History: Past Medical History:  Diagnosis Date  . Breast cancer (HCC) 1991  . Diabetes mellitus   . Hyperlipidemia   . Hypertension   . Non-stress test nonreactive, 04/24/12, normal 12/10/2012  . PVD (peripheral vascular disease) (HCC)   . Tobacco use 12/10/2012   Past Surgical History:  Procedure Laterality Date  . CARDIOVASCULAR STRESS TEST  04/24/2012   No significant ST segment change suggestive of ischemia.  . LOWER EXTREMITY ARTERIAL  DOPPLER Bilateral 05/07/2012   Left ABI-demonstrated mild arterial insufficiency. Right CIA 50-69% diameter reduction. Right SFA less than 50% diameter reduction. Left SFA 70-99% diameter reduction. Bilateral Runoff-Posterior tibial arteries appeared occluded.  Marland Kitchen MASTECTOMY PARTIAL / LUMPECTOMY W/ AXILLARY LYMPHADENECTOMY Right 1991   . TOTAL HIP ARTHROPLASTY Right 2011     Social History:  Ambulatory  independently     reports that she has been smoking Cigarettes.  She started smoking about 69 years ago. She has a 16.25 pack-year smoking history. She has never used smokeless tobacco. She reports that she does not drink alcohol or use drugs.  Allergies:   Allergies  Allergen Reactions  . Crestor [Rosuvastatin Calcium] Other (See Comments)    myalgias  . Pravastatin Other (See Comments)    Myalgias and weakness  . Penicillins Other (See Comments)    unknown       Family History:   Family History  Problem Relation Age of Onset  . Heart disease Mother   . Stroke Mother   . Stroke Father   . Cancer Sister        GIST  . Cancer Brother        thyroid   . Diabetes Son   . Stroke Son     Medications: Prior to Admission medications   Medication Sig Start Date End Date Taking? Authorizing Provider  alendronate (FOSAMAX) 70 MG tablet Take 70 mg by mouth every 7 (seven) days. Take with a full glass of water on an empty stomach. Wednesdays    [provider]  aspirin EC 81 MG tablet Take 81 mg by mouth daily.    [provider]  clotrimazole-betamethasone (LOTRISONE) cream Apply to affected area 2 times daily prn 10/31/15   Hayden Rasmussen, NP  doxycycline (VIBRAMYCIN) 100 MG capsule Take 1 capsule (100 mg total) by mouth 2 (two) times daily. 08/09/16   Deatra Canter, FNP  HYDROcodone-acetaminophen (NORCO) 5-325 MG per tablet Take 1 tablet by mouth every 4 (four) hours as needed for moderate pain. 12/19/13   Graylon Good, PA-C  hydrOXYzine (ATARAX/VISTARIL) 25  MG tablet Take 1 tablet (25 mg total) by mouth every 6 (six) hours. 08/09/16   Deatra Canter, FNP  ketoconazole (NIZORAL) 2 % cream Apply 1 application topically 2 (two) times daily. Irritation on feet 12/17/15   [provider]  metFORMIN (GLUCOPHAGE-XR) 500 MG 24 hr tablet Take 500 mg by mouth 2 (two) times daily.    [provider]  methylPREDNISolone (MEDROL DOSEPAK) 4 MG TBPK tablet Take 6-5-4-3-2-1 po qd 08/09/16   Deatra Canter, FNP  mometasone (ELOCON) 0.1 % lotion Apply 1 application topically daily as needed (IRRITATION).  04/15/14  [provider]  pantoprazole (PROTONIX) 40 MG tablet Take 1 tablet (40 mg total) by mouth daily at 12 noon. 03/04/14   Elgergawy, Leana Roe, MD  pravastatin (PRAVACHOL) 40 MG tablet Take 40 mg by mouth every evening. 11/10/15   [provider]  SPIRIVA HANDIHALER 18 MCG inhalation capsule Place 18 mcg into inhaler and inhale daily as needed (shortness of breath).  11/05/11   [provider]  valsartan (DIOVAN) 160 MG tablet Take 160 mg by mouth daily.      [provider]    Physical Exam: Patient Vitals for the past 24 hrs:  BP Temp Temp src Pulse Resp SpO2  09/07/16 1746 (!) 134/53 98.3 F (36.8 C) Oral 74 18 99 %    1. General:  in No Acute distress 2. Psychological: Alert and  Oriented 3. Head/ENT:   Dry Mucous Membranes                          Head Non traumatic, neck supple                           Poor Dentition 4. SKIN:   decreased Skin turgor,  Skin clean Dry and intact no rash 5. Heart: Regular rate and rhythm no  Murmur, Rub or gallop 6. Lungs:   no wheezes or crackles   7. Abdomen: Soft,  non-tender, Non distended 8. Lower extremities: no clubbing, cyanosis, or edema 9. Neurologically Grossly intact, moving all 4 extremities equally   10. MSK: Normal range of motionChest wall nontender pain not reproducible by palpation   body mass index is unknown because there is no height or  weight on file.  Labs on Admission:   Labs on Admission: I have personally reviewed following labs and imaging studies  CBC:  Recent Labs Lab 09/07/16 1745  WBC 5.6  HGB 11.9*  HCT 36.9  MCV 77.2*  PLT 92*   Basic Metabolic Panel:  Recent Labs Lab 09/07/16 1745  NA 140  K 4.2  CL 106  CO2 25  GLUCOSE 121*  BUN 14  CREATININE 1.01*  CALCIUM 9.2   GFR: CrCl cannot be calculated (Unknown ideal weight.). Liver Function Tests: No results for input(s): AST, ALT, ALKPHOS, BILITOT, PROT, ALBUMIN in the last 168 hours. No results for input(s): LIPASE, AMYLASE in the last 168 hours. No results for input(s): AMMONIA in the last 168 hours. Coagulation Profile: No results for input(s): INR, PROTIME in the last 168 hours. Cardiac Enzymes: No results for input(s): CKTOTAL, CKMB, CKMBINDEX, TROPONINI in the last 168 hours. BNP (last 3 results) No results for input(s): PROBNP in the last 8760 hours. HbA1C: No results for input(s): HGBA1C in the last 72 hours. CBG: No results for input(s): GLUCAP in the last 168 hours. Lipid Profile: No results for input(s): CHOL, HDL, LDLCALC, TRIG, CHOLHDL, LDLDIRECT in the last 72 hours. Thyroid Function Tests: No results for input(s): TSH, T4TOTAL, FREET4, T3FREE, THYROIDAB in the last 72 hours. Anemia Panel: No results for input(s): VITAMINB12, FOLATE, FERRITIN, TIBC, IRON, RETICCTPCT in the last 72 hours. Urine analysis:  Sepsis Labs: @LABRCNTIP (procalcitonin:4,lacticidven:4) )No results found for this or any previous visit (from the past 240 hour(s)).     UA  not ordered  Lab Results  Component Value Date   HGBA1C 6.8 (H) 04/28/2014    CrCl cannot be calculated (Unknown ideal weight.).  BNP (last 3 results) No results  for input(s): PROBNP in the last 8760 hours.   ECG REPORT  Independently reviewed Rate:73  Rhythm: NSR ST&T Change: No acute ischemic changes   QTC 394  There were no vitals filed for this  visit.   Cultures:    Component Value Date/Time   SDES WOUND right hip 12/22/2009 1845   SPECREQUEST clindamycin, vancomycin IMMUNE:NORM 12/22/2009 1845   CULT NO GROWTH 2 DAYS 12/22/2009 1845   REPTSTATUS 12/25/2009 FINAL 12/22/2009 1845     Radiological Exams on Admission: Dg Chest 2 View  Result Date: 09/07/2016 CLINICAL DATA:  Right-sided chest pain. Shortness breath. Symptoms for 3 hours. EXAM: CHEST  2 VIEW COMPARISON:  Radiograph 05/04/2016.  CT 04/28/2014 FINDINGS: The cardiomediastinal contours are unchanged with atherosclerosis of the aortic arch. Stable hyperinflation and chronic bronchial thickening. Pulmonary vasculature is normal. No consolidation, pleural effusion, or pneumothorax. No acute osseous abnormalities are seen. Post right mastectomy with surgical clips in the axilla. Chronic deformity of the left proximal humerus per IMPRESSION: No acute abnormality. Chronic hyperinflation and bronchial thickening suggesting COPD. Thoracic aortic atherosclerosis. Electronically Signed   By: Rubye Oaks M.D.   On: 09/07/2016 18:19    Chart has been reviewed    Assessment/Plan  83 y.o. female with medical history significant of breast Ca. PAD, HTN, HLD,DM2, tobacco abuse in remission,   Admitted for atypical chest pain evaluation with negative troponin Present on Admission: . Chest painSomewhat atypical but given worsening with ambulation admit for observation cycle cardiac enzymes obtain echogram may benefit from cardiology consult will follow up as an not we'll continue to cycle cardiac markers and repeat serial EKG . Thrombocytopenia (HCC) chronic currently at baseline . Dyslipidemia stable continue statin order lipid panel in a.m. DM 2 - order sliding scale hold metformin . PAD (peripheral artery disease), abnormal ABIs - currently stable, denies claudication . Essential hypertension stable continue home medications  Other plan as per orders.  DVT prophylaxis:     Lovenox     Code Status:  FULL CODE  as per patient    Family Communication:   Family not at  Bedside    Disposition Plan:     To home once workup is complete and patient is stable                        Would benefit from PT/OT eval prior to DC  ordered                                                 Consults called: email cardiology   Admission status:  obs   Level of care   tele     I have spent a total of 56 min on this admission    Leah Wiggins 09/07/2016, 8:55 PM    [facility]  Pager [phone]   after 2 AM please page floor coverage PA If 7AM-7PM, please contact the day team taking care of the patient  [email or web address]  Password [email or web address]

## 2016-09-08 ENCOUNTER — Observation Stay (HOSPITAL_BASED_OUTPATIENT_CLINIC_OR_DEPARTMENT_OTHER): Payer: Medicare Other

## 2016-09-08 DIAGNOSIS — E119 Type 2 diabetes mellitus without complications: Secondary | ICD-10-CM

## 2016-09-08 DIAGNOSIS — I10 Essential (primary) hypertension: Secondary | ICD-10-CM | POA: Diagnosis not present

## 2016-09-08 DIAGNOSIS — R0789 Other chest pain: Secondary | ICD-10-CM

## 2016-09-08 DIAGNOSIS — K861 Other chronic pancreatitis: Secondary | ICD-10-CM | POA: Diagnosis present

## 2016-09-08 DIAGNOSIS — F17210 Nicotine dependence, cigarettes, uncomplicated: Secondary | ICD-10-CM | POA: Diagnosis not present

## 2016-09-08 DIAGNOSIS — Z79899 Other long term (current) drug therapy: Secondary | ICD-10-CM | POA: Diagnosis not present

## 2016-09-08 LAB — ECHOCARDIOGRAM COMPLETE
Height: 63 in
Weight: 2497.37 oz

## 2016-09-08 LAB — GLUCOSE, CAPILLARY
Glucose-Capillary: 77 mg/dL (ref 65–99)
Glucose-Capillary: 62 mg/dL — ABNORMAL LOW (ref 65–99)
Glucose-Capillary: 114 mg/dL — ABNORMAL HIGH (ref 65–99)

## 2016-09-08 LAB — LIPASE, BLOOD: Lipase: 72 U/L — ABNORMAL HIGH (ref 11–51)

## 2016-09-08 LAB — LIPID PANEL
Cholesterol: 125 mg/dL (ref 0–200)
HDL: 51 mg/dL (ref >40)
LDL Cholesterol: 65 mg/dL (ref 0–99)
Total CHOL/HDL Ratio: 2.5 RATIO
Triglycerides: 46 mg/dL (ref <150)
VLDL: 9 mg/dL (ref 0–40)

## 2016-09-08 LAB — TROPONIN I
Troponin I: <0.03 ng/mL (ref <0.03)
Troponin I: <0.03 ng/mL (ref <0.03)

## 2016-09-08 LAB — MRSA PCR SCREENING: MRSA by PCR: NEGATIVE

## 2016-09-08 MED ORDER — ASPIRIN EC 81 MG PO TBEC
81.0000 mg | DELAYED_RELEASE_TABLET | Freq: Every day | ORAL | Status: DC
  Administered 2016-09-08: 81 mg via ORAL
  Filled 2016-09-08: qty 1

## 2016-09-08 MED ORDER — IRBESARTAN 75 MG PO TABS
150.0000 mg | ORAL_TABLET | Freq: Every day | ORAL | Status: DC
  Administered 2016-09-08: 150 mg via ORAL
  Filled 2016-09-08: qty 1

## 2016-09-08 MED ORDER — AZITHROMYCIN 500 MG PO TABS: 500.0000 mg | ORAL_TABLET | Freq: Every day | ORAL | 0 refills | Status: DC

## 2016-09-08 MED ORDER — TRAMADOL HCL 50 MG PO TABS: 50.0000 mg | ORAL_TABLET | Freq: Four times a day (QID) | ORAL | Status: DC | PRN

## 2016-09-08 MED ORDER — INSULIN ASPART 100 UNIT/ML ~~LOC~~ SOLN: 0.0000 [IU] | Freq: Every day | SUBCUTANEOUS | Status: DC

## 2016-09-08 MED ORDER — PRAVASTATIN SODIUM 40 MG PO TABS
40.0000 mg | ORAL_TABLET | Freq: Every day | ORAL | Status: DC
  Administered 2016-09-08: 40 mg via ORAL
  Filled 2016-09-08: qty 1

## 2016-09-08 MED ORDER — INSULIN ASPART 100 UNIT/ML ~~LOC~~ SOLN: 0.0000 [IU] | Freq: Three times a day (TID) | SUBCUTANEOUS | Status: DC

## 2016-09-08 MED ORDER — ENOXAPARIN SODIUM 40 MG/0.4ML ~~LOC~~ SOLN
40.0000 mg | SUBCUTANEOUS | Status: DC
  Administered 2016-09-08: 40 mg via SUBCUTANEOUS
  Filled 2016-09-08: qty 0.4

## 2016-09-08 MED ORDER — PANTOPRAZOLE SODIUM 40 MG PO TBEC
40.0000 mg | DELAYED_RELEASE_TABLET | Freq: Every day | ORAL | Status: DC
  Administered 2016-09-08: 40 mg via ORAL
  Filled 2016-09-08: qty 1

## 2016-09-08 MED ORDER — TIOTROPIUM BROMIDE MONOHYDRATE 18 MCG IN CAPS
18.0000 ug | ORAL_CAPSULE | Freq: Every day | RESPIRATORY_TRACT | Status: DC | PRN
  Filled 2016-09-08: qty 5

## 2016-09-08 MED ORDER — AZITHROMYCIN 500 MG PO TABS
500.0000 mg | ORAL_TABLET | ORAL | Status: DC
  Administered 2016-09-08: 500 mg via ORAL
  Filled 2016-09-08: qty 1

## 2016-09-08 MED ORDER — ACETAMINOPHEN 325 MG PO TABS: 650.0000 mg | ORAL_TABLET | ORAL | Status: DC | PRN

## 2016-09-08 NOTE — Care Management Note (Signed)
Case Management Note  Patient Details  Name: Leah Wiggins MRN: [id number] Date of Birth: [birth year]  Subjective/Objective:                 Patient will DC with [facility] PT through [facility]. No other CM needs identified.   Action/Plan:   Expected Discharge Date:  09/08/16               Expected Discharge Plan:  [place]  In-House Referral:     Discharge planning Services  CM Consult  Post Acute Care Choice:  Home Health Choice offered to:  Patient  DME Arranged:    DME Agency:     HH Arranged:  PT [place]:  [place]  Status of Service:  Completed, signed off  If discussed at [place] of Stay Meetings, dates discussed:    Additional Comments:  Carles Collet, RN 09/08/2016, 4:39 PM

## 2016-09-08 NOTE — Progress Notes (Signed)
  Echocardiogram 2D Echocardiogram has been performed.  Leah Wiggins 09/08/2016, 4:05 PM

## 2016-09-08 NOTE — Progress Notes (Signed)
Nutrition Brief Note  Patient identified on the Malnutrition Screening Tool (MST) Report  Wt Readings from Last 15 Encounters:  09/08/16 156 lb 1.4 oz (70.8 kg)  06/28/16 160 lb 3.2 oz (72.7 kg)  12/18/15 165 lb (74.8 kg)  06/24/15 170 lb 6.4 oz (77.3 kg)  08/14/14 175 lb 1.6 oz (79.4 kg)  07/14/14 176 lb (79.8 kg)  06/24/14 177 lb 8 oz (80.5 kg)  04/29/14 161 lb 8 oz (73.3 kg)  03/04/14 171 lb 4.8 oz (77.7 kg)  10/07/13 176 lb (79.8 kg)  07/22/13 174 lb 4.8 oz (79.1 kg)  06/24/13 174 lb 3.2 oz (79 kg)  04/15/13 177 lb (80.3 kg)  12/10/12 173 lb (78.5 kg)  07/25/12 171 lb 9.6 oz (77.8 kg)   Leah Wiggins is a 83 y.o. female with medical history significant of breast Ca. PAD, HTN, HLD,DM2, tobacco abuse in remission, presented with right-sided chest pain started while eating cake but persisted radiating to her back not associated nausea vomiting diaphoresis or shortness of breath.  Pt admitted with atypical chest pain with negative troponin.   Wt hx reviewed. No significant wt changes over the past year. Pt reports UBW is around 175#, but suspects wt loss is a combination of diuretic use and eating healthier. Pt typically consumes 2 meals daily (Lunch: soup and sandwich, Dinner: meat, starch, and vegetable).   Pt is hungry and eagerly awaiting lunch tray.   Nutrition-Focused physical exam completed. Findings are no fat depletion, no muscle depletion, and no edema.   Body mass index is 27.65 kg/m. Patient meets criteria for overweight based on current BMI.   Current diet order is Carb Modified, patient is consuming approximately n/a% of meals at this time. Labs and medications reviewed.   No nutrition interventions warranted at this time. If nutrition issues arise, please consult RD.   Leah Wiggins, RD, LDN, CDE Pager: [phone] After hours Pager: [phone]

## 2016-09-08 NOTE — Progress Notes (Signed)
Patient refused 0825 troponin blood draw stating her arm was sore from previous blood draws.

## 2016-09-08 NOTE — Consult Note (Signed)
Cardiology Consultation:   Patient ID: Leah Wiggins; [id number]; Apr 30, 1926   Admit date: 09/07/2016 Date of Consult: 09/08/2016  Primary Care Provider: Glendale Chard, MD Primary Cardiologist: Dr. Gwenlyn Found  Primary Electrophysiologist:  n/a   Patient Profile:   Leah Wiggins is a 83 y.o. female with a hx of heavy tobacco abuse in remission, HTN, HLD, DMT2, thrombocytopenia and PAD who is being seen today for the evaluation of chest pain at the request of Dr. Karleen Hampshire.  History of Present Illness:   Leah Wiggins is followed by Dr. Gwenlyn Found for her PAD. She is known to have peripheral vascular occlusive disease with dopplers that showed a right ABI of 0.64 with a high-frequency signal in the origin of the right common iliac artery. She also had a left ABI of 0.84 with a high-frequency signal in the origin of the left SFA. However, she has denied claudication. 2D ECHO 2015 showed normal LV and diastolic function. She had a negative Myoview performed 04/24/12 and more recently 04/29/14 for atypical chest pain. She was last seen in by Dr. Gwenlyn Found in the office in 06/2016 and was doing well. She was continued with conservative management.   She was in her usual state of health until eating cake on 09/07/16 and she developed right sided chest pain. She presented to [facility] ED for evaluation. Initial cardiac enzymes were normal but D dimer elevated ~3. CTA angio was negative for PE but did show coronary and aortic calcifications/athlesclerosis.   Patient seen today laying in bed. Somewhat of a difficult historian but she described the pain as a deep, right sided chest pain that occurred at rest while eating her second piece of cake. No associated SOB or diaphoresis. She did have some nausea. It was worse with movements to the point that she felt like she couldn't stand up straight. Her daughter drove her to [facility] ED where eventually the pain self resolved. She is currently chest pain free but did have some left  sided arm pain this AM. She lives in [place] with her son and granddaughter but basically takes care of herself. She walks without a walker and is able to go to the grocery store or walk to her mailbox without chest pain or SOB.   No LE edema, orthopnea or PND. No dizziness or syncope. She does sometimes he weak in her legs.   Past Medical History:  Diagnosis Date  . Breast cancer ([place]) 1991  . Diabetes mellitus   . Hyperlipidemia   . Hypertension   . Non-stress test nonreactive, 04/24/12, normal 12/10/2012  . PVD (peripheral vascular disease) ([place])   . Tobacco use 12/10/2012    Past Surgical History:  Procedure Laterality Date  . CARDIOVASCULAR STRESS TEST  04/24/2012   No significant ST segment change suggestive of ischemia.  . LOWER EXTREMITY ARTERIAL DOPPLER Bilateral 05/07/2012   Left ABI-demonstrated mild arterial insufficiency. Right CIA 50-69% diameter reduction. Right SFA less than 50% diameter reduction. Left SFA 70-99% diameter reduction. Bilateral Runoff-Posterior tibial arteries appeared occluded.  Marland Kitchen MASTECTOMY PARTIAL / LUMPECTOMY W/ AXILLARY LYMPHADENECTOMY Right 1991   . TOTAL HIP ARTHROPLASTY Right 2011     Inpatient Medications: Scheduled Meds: . aspirin EC  81 mg Oral Daily  . enoxaparin (LOVENOX) injection  40 mg Subcutaneous Q24H  . insulin aspart  0-5 Units Subcutaneous QHS  . insulin aspart  0-9 Units Subcutaneous TID WC  . irbesartan  150 mg Oral Daily  . pantoprazole  40 mg Oral Q1200  .  pravastatin  40 mg Oral Daily   Continuous Infusions:  PRN Meds: acetaminophen, morphine injection, ondansetron (ZOFRAN) IV, tiotropium  Allergies:    Allergies  Allergen Reactions  . Crestor [Rosuvastatin Calcium] Other (See Comments)    myalgias  . Pravastatin Other (See Comments)    Myalgias and weakness  . Penicillins Other (See Comments)    Unknown Has patient had a PCN reaction causing immediate rash, facial/tongue/throat swelling, SOB or lightheadedness  with hypotension: No Has patient had a PCN reaction causing severe rash involving mucus membranes or skin necrosis: No Has patient had a PCN reaction that required hospitalization: No Has patient had a PCN reaction occurring within the last 10 years: No If all of the above answers are "NO", then may proceed with Cephalosporin use.    Social History:   Social History   Social History  . Marital status: Widowed    Spouse name: N/A  . Number of children: N/A  . Years of education: N/A   Occupational History  . Not on file.   Social History Main Topics  . Smoking status: Current Some Day Smoker    Packs/day: 0.25    Years: 65.00    Types: Cigarettes    Start date: 03/08/1947  . Smokeless tobacco: Never Used     Comment: using nicoderm patches to quit smoking  . Alcohol use No  . Drug use: No  . Sexual activity: No   Other Topics Concern  . Not on file   Social History Narrative  . No narrative on file    Family History:   The patient's family history includes Cancer in her brother and sister; Diabetes in her son; Heart disease in her mother; Stroke in her father, mother, and son.  ROS:  Please see the history of present illness.  ROS  All other ROS reviewed and negative.     Physical Exam/Data:   Vitals:   09/08/16 0426 09/08/16 0500 09/08/16 0756 09/08/16 0758  BP: (!) 135/51 (!) 115/50 (!) 89/77 (!) 126/48  Pulse: 63 (!) 56 69   Resp: 16 15    Temp: 97.8 F (36.6 C)  (!) 97.5 F (36.4 C)   TempSrc: Oral  Oral   SpO2: 100% 99% 100%   Weight:      Height:       No intake or output data in the 24 hours ending 09/08/16 0934 Filed Weights   09/08/16 0030  Weight: 156 lb 1.4 oz (70.8 kg)   Body mass index is 27.65 kg/m.  General:  Well nourished, well developed, in no acute distress, elderly and frail, overweight AA female HEENT: normal Lymph: no adenopathy Neck: no JVD Endocrine:  No thryomegaly Vascular: No carotid bruits; FA pulses 2+ bilaterally  without bruits  Cardiac:  normal S1, S2; RRR; no murmur  Lungs:  clear to auscultation bilaterally, no wheezing, rhonchi or rales  Abd: soft, nontender, no hepatomegaly  Ext: no edema Musculoskeletal:  No deformities, BUE and BLE strength normal and equal Skin: warm and dry  Neuro:  CNs 2-12 intact, no focal abnormalities noted Psych:  Normal affect   EKG:  The EKG was personally reviewed and demonstrates:  Sinus rhythm, low voltage QRS Telemetry:  Telemetry was personally reviewed and demonstrates:  Sinus   Relevant CV Studies: 2D ECHO: 03/04/2014 LV EF: 55% -  60% Study Conclusion - Left ventricle: The cavity size was normal. Wall thickness was increased in a pattern of moderate LVH. Systolic function was normal.  The estimated ejection fraction was in the range of 55% to 60%. Wall motion was normal; there were no regional wall motion abnormalities. Left ventricular diastolic function parameters were normal. - Pulmonary arteries: PA peak pressure: 33 mm Hg (S).  Laboratory Data:  Chemistry Recent Labs Lab 09/07/16 1745  NA 140  K 4.2  CL 106  CO2 25  GLUCOSE 121*  BUN 14  CREATININE 1.01*  CALCIUM 9.2  GFRNONAA 49*  GFRAA 56*  ANIONGAP 9     Recent Labs Lab 09/07/16 2026  PROT 6.2*  ALBUMIN 3.2*  AST 21  ALT 17  ALKPHOS 60  BILITOT 0.5   Hematology Recent Labs Lab 09/07/16 1745  WBC 5.6  RBC 4.78  HGB 11.9*  HCT 36.9  MCV 77.2*  MCH 24.9*  MCHC 32.2  RDW 17.2*  PLT 92*   Cardiac Enzymes Recent Labs Lab 09/07/16 2026 09/08/16 0222 09/08/16 0742  TROPONINI <0.03 <0.03 <0.03    Recent Labs Lab 09/07/16 1750  TROPIPOC 0.01    BNPNo results for input(s): BNP, PROBNP in the last 168 hours.  DDimer  Recent Labs Lab 09/07/16 2026  DDIMER 3.08*    Radiology/Studies:  Dg Chest 2 View  Result Date: 09/07/2016 CLINICAL DATA:  Right-sided chest pain. Shortness breath. Symptoms for 3 hours. EXAM: CHEST  2 VIEW COMPARISON:   Radiograph 05/04/2016.  CT 04/28/2014 FINDINGS: The cardiomediastinal contours are unchanged with atherosclerosis of the aortic arch. Stable hyperinflation and chronic bronchial thickening. Pulmonary vasculature is normal. No consolidation, pleural effusion, or pneumothorax. No acute osseous abnormalities are seen. Post right mastectomy with surgical clips in the axilla. Chronic deformity of the left proximal humerus per IMPRESSION: No acute abnormality. Chronic hyperinflation and bronchial thickening suggesting COPD. Thoracic aortic atherosclerosis. Electronically Signed   By: Jeb Levering M.D.   On: 09/07/2016 18:19   Ct Angio Chest Pe W Or Wo Contrast  Result Date: 09/07/2016 CLINICAL DATA:  Right-sided chest pain radiating to the back. Elevated D-dimer, elevated liver function tests and lipase. EXAM: CT ANGIOGRAPHY CHEST CT ABDOMEN WITH CONTRAST TECHNIQUE: Multidetector CT imaging of the chest was performed using the standard protocol during bolus administration of intravenous contrast. Multiplanar CT image reconstructions and MIPs were obtained to evaluate the vascular anatomy. Multidetector CT imaging of the abdomen and pelvis was performed using the standard protocol during bolus administration of intravenous contrast. CONTRAST:  80 cc Isovue-300 COMPARISON:  Ultrasound from 03/04/2014, CXR from earlier on the same day FINDINGS: CTA CHEST FINDINGS Cardiovascular: Normal size cardiac chambers. No aortic aneurysm or dissection. There is aortic atherosclerosis. Coronary arteriosclerosis is identified. No acute pulmonary embolus. Trace pericardial effusion and/or thickening posteriorly on left. Mediastinum/Nodes: Normal sized thyroid gland. Patent trachea mainstem bronchi. No mediastinal nor hilar lymphadenopathy. Lungs/Pleura: Mild peribronchial thickening bilaterally which may reflect bronchitic change. Faint tiny tree-in-bud densities in the periphery of the right upper lobe may also reflect  bronchiolitis. Musculoskeletal: Axillary dissection clips on the right. Right mastectomy. Thoracolumbar spondylosis with multilevel degenerative disc disease. No acute fracture nor suspicious osseous lesions. No chest wall masses. Visualized ribs appear intact without fracture or destruction. Review of the MIP images confirms the above findings. CT ABDOMEN FINDINGS Hepatobiliary: . This measures 8 mm. Hepatic dome hypodensity too small to characterize possibly a cyst or hemangioma is noted. No gallstones, gallbladder wall thickening, or biliary dilatation. The gallbladder is contracted in appearance. Pancreas: Scattered calcifications of the pancreatic gland consistent with stigmata of chronic pancreatitis. No acute pancreatic inflammation is identified.  No ductal dilatation or mass is noted. Spleen: Normal Adrenals/Urinary Tract: Normal bilateral adrenal glands. No obstructive uropathy. Tiny too small to characterize subcentimeter hypodensities within the interpolar and lower poles of both kidneys statistically consistent with cysts. Stomach/Bowel: Moderate distention of the stomach with food. Normal small bowel rotation without bowel obstruction or inflammation. Vascular/Lymphatic: Aortic atherosclerosis with atherosclerosis at the origin of the SMA and both common iliac arteries. No aneurysm or dissection. Other: No free air nor free fluid. Musculoskeletal: Thoracolumbar spondylosis. No acute osseous abnormality. Review of the MIP images confirms the above findings. IMPRESSION: 1. No acute pulmonary embolus, aortic aneurysm or dissection. 2. Coronary arteriosclerosis with aortic atherosclerosis. 3. Mild peribronchial thickening with tiny tree-in-bud densities in the right upper lobe may reflect bronchitic change in bronchiolitis. 4. Stigmata of chronic pancreatitis with scattered calcifications noted within the pancreas. 5. 8 mm nonspecific hypodensity in the right hepatic dome likely to represent a small cyst or  hemangioma statistically. 6. Bilateral subcentimeter renal hypodensities statistically to represent small cysts as well. 7. Thoracolumbar spondylosis. Electronically Signed   By: Ashley Royalty M.D.   On: 09/07/2016 23:00   Ct Abdomen W Contrast  Result Date: 09/07/2016 CLINICAL DATA:  Right-sided chest pain radiating to the back. Elevated D-dimer, elevated liver function tests and lipase. EXAM: CT ANGIOGRAPHY CHEST CT ABDOMEN WITH CONTRAST TECHNIQUE: Multidetector CT imaging of the chest was performed using the standard protocol during bolus administration of intravenous contrast. Multiplanar CT image reconstructions and MIPs were obtained to evaluate the vascular anatomy. Multidetector CT imaging of the abdomen and pelvis was performed using the standard protocol during bolus administration of intravenous contrast. CONTRAST:  80 cc Isovue-300 COMPARISON:  Ultrasound from 03/04/2014, CXR from earlier on the same day FINDINGS: CTA CHEST FINDINGS Cardiovascular: Normal size cardiac chambers. No aortic aneurysm or dissection. There is aortic atherosclerosis. Coronary arteriosclerosis is identified. No acute pulmonary embolus. Trace pericardial effusion and/or thickening posteriorly on left. Mediastinum/Nodes: Normal sized thyroid gland. Patent trachea mainstem bronchi. No mediastinal nor hilar lymphadenopathy. Lungs/Pleura: Mild peribronchial thickening bilaterally which may reflect bronchitic change. Faint tiny tree-in-bud densities in the periphery of the right upper lobe may also reflect bronchiolitis. Musculoskeletal: Axillary dissection clips on the right. Right mastectomy. Thoracolumbar spondylosis with multilevel degenerative disc disease. No acute fracture nor suspicious osseous lesions. No chest wall masses. Visualized ribs appear intact without fracture or destruction. Review of the MIP images confirms the above findings. CT ABDOMEN FINDINGS Hepatobiliary: . This measures 8 mm. Hepatic dome hypodensity too  small to characterize possibly a cyst or hemangioma is noted. No gallstones, gallbladder wall thickening, or biliary dilatation. The gallbladder is contracted in appearance. Pancreas: Scattered calcifications of the pancreatic gland consistent with stigmata of chronic pancreatitis. No acute pancreatic inflammation is identified. No ductal dilatation or mass is noted. Spleen: Normal Adrenals/Urinary Tract: Normal bilateral adrenal glands. No obstructive uropathy. Tiny too small to characterize subcentimeter hypodensities within the interpolar and lower poles of both kidneys statistically consistent with cysts. Stomach/Bowel: Moderate distention of the stomach with food. Normal small bowel rotation without bowel obstruction or inflammation. Vascular/Lymphatic: Aortic atherosclerosis with atherosclerosis at the origin of the SMA and both common iliac arteries. No aneurysm or dissection. Other: No free air nor free fluid. Musculoskeletal: Thoracolumbar spondylosis. No acute osseous abnormality. Review of the MIP images confirms the above findings. IMPRESSION: 1. No acute pulmonary embolus, aortic aneurysm or dissection. 2. Coronary arteriosclerosis with aortic atherosclerosis. 3. Mild peribronchial thickening with tiny tree-in-bud densities in the right upper  lobe may reflect bronchitic change in bronchiolitis. 4. Stigmata of chronic pancreatitis with scattered calcifications noted within the pancreas. 5. 8 mm nonspecific hypodensity in the right hepatic dome likely to represent a small cyst or hemangioma statistically. 6. Bilateral subcentimeter renal hypodensities statistically to represent small cysts as well. 7. Thoracolumbar spondylosis. Electronically Signed   By: Ashley Royalty M.D.   On: 09/07/2016 23:00    Assessment and Plan:   Chest pain: very atypical. She has ruled out for MI with negative cardiac enzymes and non acute ECG. She does have RFs for CAD including PAD, previous heavy tobacco abuse, HTN, HLD,  and DMT2. CT angio shows coronary/aortic atherosclerosis. However, this chest pain is clearly not cardiac so we will plan no further testing. We will sign off.   Coronary artery and aortic arthrosclerosis: continue ASA and statin   HTN: BP well controlled currently   HLD: LDL at goal   DMT2: SSI while here.    Signed, Angelena Form, PA-C  09/08/2016 9:34 AM  As above, patient seen and examined. Briefly she is an 83 year old female with past medical history of diabetes mellitus, hypertension, hyperlipidemia, PAD for evaluation of chest pain. Last echocardiogram in 2015 showed normal LV function. Nuclear study every 2016 showed ejection fraction 84% and no ischemia or infarction. Patient presents with complaints of pain in the right mid to lower chest area. She describes the pain as sharp without radiation. It would last seconds to 1 minute and resolve but then returned. The pain increased with certain movements. He is nonpleuritic exertional. She was admitted and cardiology asked to evaluate. She does have dyspnea on exertion but denies exertional chest pain.  Chest/abdominal CT shows no pulmonary embolus. There is note of coronary artery calcification. She did not have gallstones. Cardiac enzymes negative. Electrocardiogram shows sinus rhythm with no ST changes. Lipase 303. Hemoglobin 11.9 with MCV 77.   1 chest pain-symptoms do not sound to be consistent with cardiac etiology. They increase with certain movements and last seconds to 1 minute. Her enzymes are negative. Her electrocardiogram shows no ST changes. She had a previous nuclear study in 2016 that shows no ischemia. I would not pursue further cardiac evaluation. Note CT scan showed no pulmonary embolus and no gallstones.  2 coronary artery disease-patient is noted to have coronary calcification on her CT scan and also with history of peripheral vascular disease. Continue aspirin and statin.  3 hypertension-blood pressure controlled.  Continue preadmission medications.  4 tobacco abuse-patient counseled on discontinuing.  5 Microcytic anemia-further evaluation per primary care.  We will sign off. Please call with questions. Please arrange appointment with APP 2-4 weeks following DC. FU Dr Gwenlyn Found 3 months  Kirk Ruths MD

## 2016-09-08 NOTE — Progress Notes (Addendum)
Patient discharged to home with [facility], AVS reviewed, MD contacted to change patients pharmacy and verify discharge, IV removed and tele returned. Patient left floor via wheelchair with staff and family members

## 2016-09-08 NOTE — Evaluation (Signed)
Physical Therapy Evaluation Patient Details Name: Leah Wiggins MRN: [id number] DOB: March 22, 1926 Today's Date: 09/08/2016   History of Present Illness  Pt is an 83 yo female admitted through ED on 09/07/16 with right sided chest pain. Pt was found to have thrombocytopenia which is chronic and all other cardiac measures have been ruled out thus far. PMH significant for chronic pancreatitis, PAD, HLD, HTN, DM, PVD, tobacco abuse.   Clinical Impression  Pt presents with the above diagnosis and below deficits for therapy evaluation. Prior to admission, pt lived with her son and grand-daughter in a single level home and reports being independent with occasional use of a [place]. Pt requires Min guard for mobility and Min guard to min A for gait without an AD with a little LOB as gait distance increases. No chest pain is noted following activity. Pt will benefit from continued acute PT services in order to address the below deficits prior to discharge to venue recommended below.     Follow Up Recommendations Home health PT    Equipment Recommendations  None recommended by PT    Recommendations for Other Services       Precautions / Restrictions Precautions Precautions: Fall Restrictions Weight Bearing Restrictions: No      Mobility  Bed Mobility Overal bed mobility: Needs Assistance Bed Mobility: Supine to Sit;Sit to Supine     Supine to sit: Min guard Sit to supine: Min guard   General bed mobility comments: Min guard for safety from EOB  Transfers Overall transfer level: Needs assistance Equipment used: None Transfers: Sit to/from Stand Sit to Stand: Min guard         General transfer comment: MIn guard for safety and to steady upon standing  Ambulation/Gait Ambulation/Gait assistance: Min guard;Min assist Ambulation Distance (Feet): 150 Feet Assistive device: None Gait Pattern/deviations: Step-through pattern;Decreased step length - right;Decreased step length -  left Gait velocity: decreased Gait velocity interpretation: Below normal speed for age/gender General Gait Details: slow gait speed, decreased step length bilaterally. 1 LOB with Min A to recover due to narrow BOS  Stairs            Wheelchair Mobility    Modified Rankin (Stroke Patients Only)       Balance Overall balance assessment: Needs assistance Sitting-balance support: No upper extremity supported;Feet supported Sitting balance-Leahy Scale: Good     Standing balance support: No upper extremity supported Standing balance-Leahy Scale: Fair                               Pertinent Vitals/Pain Pain Assessment: No/denies pain    Home Living Family/patient expects to be discharged to:: Private residence Living Arrangements: Children Available Help at Discharge: Family;Available PRN/intermittently Type of Home: House Home Access: Stairs to enter Entrance Stairs-Rails: Right Entrance Stairs-Number of Steps: 3-4 Home Layout: One level Home Equipment: Walker - 2 wheels;Cane - single point      Prior Function Level of Independence: Independent with assistive device(s)         Comments: reports using  with mobility outside home     Hand Dominance   Dominant Hand: Right    Extremity/Trunk Assessment   Upper Extremity Assessment Upper Extremity Assessment: Overall WFL for tasks assessed    Lower Extremity Assessment Lower Extremity Assessment: Generalized weakness    Cervical / Trunk Assessment Cervical / Trunk Assessment: Normal  Communication   Communication: HOH;No difficulties  Cognition Arousal/Alertness: Awake/alert Behavior  During Therapy: WFL for tasks assessed/performed Overall Cognitive Status: Within Functional Limits for tasks assessed                                        General Comments General comments (skin integrity, edema, etc.): Echo coming in for [place] once PT completed.     Exercises      Assessment/Plan    PT Assessment Patient needs continued PT services  PT Problem List Decreased strength;Decreased activity tolerance;Decreased balance;Decreased mobility;Decreased knowledge of use of DME       PT Treatment Interventions DME instruction;Gait training;Stair training;Functional mobility training;Therapeutic activities;Therapeutic exercise;Balance training    PT Goals (Current goals can be found in the Care Plan section)  Acute Rehab PT Goals Patient Stated Goal: to go home soon PT Goal Formulation: With patient Time For Goal Achievement: 09/15/16 Potential to Achieve Goals: Good    Frequency Min 3X/week   Barriers to discharge        Co-evaluation               AM-PAC PT "6 Clicks" Daily Activity  Outcome Measure Difficulty turning over in bed (including adjusting bedclothes, sheets and blankets)?: None Difficulty moving from lying on back to sitting on the side of the bed? : A Little Difficulty sitting down on and standing up from a chair with arms (e.g., wheelchair, bedside commode, etc,.)?: A Little Help needed moving to and from a bed to chair (including a wheelchair)?: A Little Help needed walking in hospital room?: A Little Help needed climbing 3-5 steps with a railing? : A Little 6 Click Score: 19    End of Session Equipment Utilized During Treatment: Gait belt Activity Tolerance: Patient tolerated treatment well Patient left: in bed;with call bell/phone within reach;Other (comment) (Echo tech) Nurse Communication: Mobility status PT Visit Diagnosis: Unsteadiness on feet (R26.81);Muscle weakness (generalized) (M62.81)    Time: 5852-7782 PT Time Calculation (min) (ACUTE ONLY): 12 min   Charges:   PT Evaluation $PT Eval Moderate Complexity: 1 Procedure     PT G Codes:   PT G-Codes **NOT FOR INPATIENT CLASS** Functional Assessment Tool Used: AM-PAC 6 Clicks Basic Mobility;Clinical judgement Functional Limitation: Mobility: Walking and  moving around Mobility: Walking and Moving Around Current Status (U2353): At least 20 percent but less than 40 percent impaired, limited or restricted Mobility: Walking and Moving Around Goal Status [id number]): At least 1 percent but less than 20 percent impaired, limited or restricted    Scheryl Marten PT, DPT  [phone]   Shanon Rosser 09/08/2016, 3:26 PM

## 2016-09-08 NOTE — Progress Notes (Signed)
Report called to [place], RN on 6E. Patient to transfer to 6E25.

## 2016-09-09 LAB — HEMOGLOBIN A1C
Hgb A1c MFr Bld: 6.3 % — ABNORMAL HIGH (ref 4.8–5.6)
Mean Plasma Glucose: 134 mg/dL

## 2016-09-09 NOTE — Discharge Summary (Addendum)
Physician Discharge Summary  JANELIZ PRESTWOOD GEX:[id number] DOB: [birth year] DOA: 09/07/2016  PCP: Glendale Chard, MD  Admit date: 09/07/2016 Discharge date: 09/08/2016  Admitted From: Home.  Disposition: Home.   Recommendations for Outpatient Follow-up:  1. Follow up with PCP in 1-2 weeks 2. Please obtain BMP/CBC in one week 3. Please follow up with cardiology in 2 to 4 weeks.    Discharge Condition:stable.  CODE STATUS: full code.  Diet recommendation: Heart Healthy   Brief/Interim Summary: Leah Wiggins is a 83 y.o. female with medical history significant of breast Ca. PAD, HTN, HLD,DM2, tobacco abuse in remission, Presented with right-sided chest pain started when eating, not associated with nausea, vomiting or abd pain. She was admitted for evaluation of ACS. Cardiology consulted .  Discharge Diagnoses:  Active Problems:   Thrombocytopenia (HCC)   PAD (peripheral artery disease), abnormal ABIs   Dyslipidemia   Essential hypertension   Diabetes mellitus without complication (HCC)   Chest pain   Chronic pancreatitis (HCC)  right sided chest pain: atypical, not associated with sob or nausea, vomiting.  Resolved.  Negative enzymes.  Cardiology consulted recommended outpatient follow up with cardiology PA in 2 to 4 weeks.  CT chest does not show any PE or gall stones.  differential include, pancreatitis vs GERD VS musculoskeletal as she has h/o osteoarthritis.    CAD  Resume aspirin and statin.     Hypertension: well controlled.    Tobacco abuse:  Counseled.    Microcytic anemia: Mild. Outpatient follow up.    Diabetes mellitus:  Resume home meds.  A1c is 6.2    Thrombocytopenia:  Chronic stable around 90,000 from 2016.     H/o PAD:  RESUME statin and aspirin.  Outpatient follow up .   Mild acute on chronic pancreatitis:  Lipase levels improving and no abd pain.  Able to tolerate regular diet without any complaints.   Right sided chest  pain, worse with movements of the arms and unable to do daily activities due to  Worsening pain Recommend PT evaluation, and home health PT on discharge.   Discharge Instructions  Discharge Instructions    Diet - low sodium heart healthy    Complete by:  As directed    Discharge instructions    Complete by:  As directed    Follow up with PCP in one week.     Allergies as of 09/08/2016      Reactions   Crestor [rosuvastatin Calcium] Other (See Comments)   myalgias   Pravastatin Other (See Comments)   Myalgias and weakness   Penicillins Other (See Comments)   Unknown Has patient had a PCN reaction causing immediate rash, facial/tongue/throat swelling, SOB or lightheadedness with hypotension: No Has patient had a PCN reaction causing severe rash involving mucus membranes or skin necrosis: No Has patient had a PCN reaction that required hospitalization: No Has patient had a PCN reaction occurring within the last 10 years: No If all of the above answers are "NO", then may proceed with Cephalosporin use.      Medication List    STOP taking these medications   doxycycline 100 MG capsule Commonly known as:  VIBRAMYCIN   HYDROcodone-acetaminophen 5-325 MG tablet Commonly known as:  NORCO   methylPREDNISolone 4 MG Tbpk tablet Commonly known as:  MEDROL DOSEPAK     TAKE these medications   alendronate 70 MG tablet Commonly known as:  FOSAMAX Take 70 mg by mouth every 7 (seven) days. Take with a full  glass of water on an empty stomach. Wednesdays   aspirin EC 81 MG tablet Take 81 mg by mouth daily.   azithromycin 500 MG tablet Commonly known as:  ZITHROMAX Take 1 tablet (500 mg total) by mouth daily.   clotrimazole-betamethasone cream Commonly known as:  LOTRISONE Apply to affected area 2 times daily prn What changed:  how much to take  how to take this  when to take this  additional instructions   fluocinonide ointment 0.05 % Commonly known as:  LIDEX Apply 1  application topically 2 (two) times daily.   hydrOXYzine 25 MG tablet Commonly known as:  ATARAX/VISTARIL Take 1 tablet (25 mg total) by mouth every 6 (six) hours.   ketoconazole 2 % cream Commonly known as:  NIZORAL Apply 1 application topically 2 (two) times daily. Irritation on feet   metFORMIN 500 MG 24 hr tablet Commonly known as:  GLUCOPHAGE-XR Take 500 mg by mouth 2 (two) times daily.   mometasone 0.1 % lotion Commonly known as:  ELOCON Apply 1 application topically daily as needed (IRRITATION).   pantoprazole 40 MG tablet Commonly known as:  PROTONIX Take 1 tablet (40 mg total) by mouth daily at 12 noon.   pravastatin 40 MG tablet Commonly known as:  PRAVACHOL Take 40 mg by mouth daily.   SPIRIVA HANDIHALER 18 MCG inhalation capsule Generic drug:  tiotropium Place 18 mcg into inhaler and inhale daily as needed (shortness of breath).   triamcinolone cream 0.1 % Commonly known as:  KENALOG Apply 1 application topically 2 (two) times daily.   valsartan 160 MG tablet Commonly known as:  DIOVAN Take 160 mg by mouth daily.      Follow-up Information    Glendale Chard, MD. Schedule an appointment as soon as possible for a visit in 1 week(s).   Specialty:  Internal Medicine Contact information: [address] [place] [address] [phone]        [facility] Follow up.   Why:  for home health  Contact information: [place] [address] [phone]          Allergies  Allergen Reactions  . Crestor [Rosuvastatin Calcium] Other (See Comments)    myalgias  . Pravastatin Other (See Comments)    Myalgias and weakness  . Penicillins Other (See Comments)    Unknown Has patient had a PCN reaction causing immediate rash, facial/tongue/throat swelling, SOB or lightheadedness with hypotension: No Has patient had a PCN reaction causing severe rash involving mucus membranes or skin necrosis: No Has  patient had a PCN reaction that required hospitalization: No Has patient had a PCN reaction occurring within the last 10 years: No If all of the above answers are "NO", then may proceed with Cephalosporin use.    Consultations:  Cardiology.    Procedures/Studies: Dg Chest 2 View  Result Date: 09/07/2016 CLINICAL DATA:  Right-sided chest pain. Shortness breath. Symptoms for 3 hours. EXAM: CHEST  2 VIEW COMPARISON:  Radiograph 05/04/2016.  CT 04/28/2014 FINDINGS: The cardiomediastinal contours are unchanged with atherosclerosis of the aortic arch. Stable hyperinflation and chronic bronchial thickening. Pulmonary vasculature is normal. No consolidation, pleural effusion, or pneumothorax. No acute osseous abnormalities are seen. Post right mastectomy with surgical clips in the axilla. Chronic deformity of the left proximal humerus per IMPRESSION: No acute abnormality. Chronic hyperinflation and bronchial thickening suggesting COPD. Thoracic aortic atherosclerosis. Electronically Signed   By: Jeb Levering M.D.   On: 09/07/2016 18:19  Ct Angio Chest Pe W Or Wo Contrast  Result Date: 09/07/2016 CLINICAL DATA:  Right-sided chest pain radiating to the back. Elevated D-dimer, elevated liver function tests and lipase. EXAM: CT ANGIOGRAPHY CHEST CT ABDOMEN WITH CONTRAST TECHNIQUE: Multidetector CT imaging of the chest was performed using the standard protocol during bolus administration of intravenous contrast. Multiplanar CT image reconstructions and MIPs were obtained to evaluate the vascular anatomy. Multidetector CT imaging of the abdomen and pelvis was performed using the standard protocol during bolus administration of intravenous contrast. CONTRAST:  80 cc Isovue-300 COMPARISON:  Ultrasound from 03/04/2014, CXR from earlier on the same day FINDINGS: CTA CHEST FINDINGS Cardiovascular: Normal size cardiac chambers. No aortic aneurysm or dissection. There is aortic atherosclerosis. Coronary  arteriosclerosis is identified. No acute pulmonary embolus. Trace pericardial effusion and/or thickening posteriorly on left. Mediastinum/Nodes: Normal sized thyroid gland. Patent trachea mainstem bronchi. No mediastinal nor hilar lymphadenopathy. Lungs/Pleura: Mild peribronchial thickening bilaterally which may reflect bronchitic change. Faint tiny tree-in-bud densities in the periphery of the right upper lobe may also reflect bronchiolitis. Musculoskeletal: Axillary dissection clips on the right. Right mastectomy. Thoracolumbar spondylosis with multilevel degenerative disc disease. No acute fracture nor suspicious osseous lesions. No chest wall masses. Visualized ribs appear intact without fracture or destruction. Review of the MIP images confirms the above findings. CT ABDOMEN FINDINGS Hepatobiliary: . This measures 8 mm. Hepatic dome hypodensity too small to characterize possibly a cyst or hemangioma is noted. No gallstones, gallbladder wall thickening, or biliary dilatation. The gallbladder is contracted in appearance. Pancreas: Scattered calcifications of the pancreatic gland consistent with stigmata of chronic pancreatitis. No acute pancreatic inflammation is identified. No ductal dilatation or mass is noted. Spleen: Normal Adrenals/Urinary Tract: Normal bilateral adrenal glands. No obstructive uropathy. Tiny too small to characterize subcentimeter hypodensities within the interpolar and lower poles of both kidneys statistically consistent with cysts. Stomach/Bowel: Moderate distention of the stomach with food. Normal small bowel rotation without bowel obstruction or inflammation. Vascular/Lymphatic: Aortic atherosclerosis with atherosclerosis at the origin of the SMA and both common iliac arteries. No aneurysm or dissection. Other: No free air nor free fluid. Musculoskeletal: Thoracolumbar spondylosis. No acute osseous abnormality. Review of the MIP images confirms the above findings. IMPRESSION: 1. No acute  pulmonary embolus, aortic aneurysm or dissection. 2. Coronary arteriosclerosis with aortic atherosclerosis. 3. Mild peribronchial thickening with tiny tree-in-bud densities in the right upper lobe may reflect bronchitic change in bronchiolitis. 4. Stigmata of chronic pancreatitis with scattered calcifications noted within the pancreas. 5. 8 mm nonspecific hypodensity in the right hepatic dome likely to represent a small cyst or hemangioma statistically. 6. Bilateral subcentimeter renal hypodensities statistically to represent small cysts as well. 7. Thoracolumbar spondylosis. Electronically Signed   By: Ashley Royalty M.D.   On: 09/07/2016 23:00   Ct Abdomen W Contrast  Result Date: 09/07/2016 CLINICAL DATA:  Right-sided chest pain radiating to the back. Elevated D-dimer, elevated liver function tests and lipase. EXAM: CT ANGIOGRAPHY CHEST CT ABDOMEN WITH CONTRAST TECHNIQUE: Multidetector CT imaging of the chest was performed using the standard protocol during bolus administration of intravenous contrast. Multiplanar CT image reconstructions and MIPs were obtained to evaluate the vascular anatomy. Multidetector CT imaging of the abdomen and pelvis was performed using the standard protocol during bolus administration of intravenous contrast. CONTRAST:  80 cc Isovue-300 COMPARISON:  Ultrasound from 03/04/2014, CXR from earlier on the same day FINDINGS: CTA CHEST FINDINGS Cardiovascular: Normal size cardiac chambers. No aortic aneurysm or dissection. There is aortic atherosclerosis. Coronary  arteriosclerosis is identified. No acute pulmonary embolus. Trace pericardial effusion and/or thickening posteriorly on left. Mediastinum/Nodes: Normal sized thyroid gland. Patent trachea mainstem bronchi. No mediastinal nor hilar lymphadenopathy. Lungs/Pleura: Mild peribronchial thickening bilaterally which may reflect bronchitic change. Faint tiny tree-in-bud densities in the periphery of the right upper lobe may also reflect  bronchiolitis. Musculoskeletal: Axillary dissection clips on the right. Right mastectomy. Thoracolumbar spondylosis with multilevel degenerative disc disease. No acute fracture nor suspicious osseous lesions. No chest wall masses. Visualized ribs appear intact without fracture or destruction. Review of the MIP images confirms the above findings. CT ABDOMEN FINDINGS Hepatobiliary: . This measures 8 mm. Hepatic dome hypodensity too small to characterize possibly a cyst or hemangioma is noted. No gallstones, gallbladder wall thickening, or biliary dilatation. The gallbladder is contracted in appearance. Pancreas: Scattered calcifications of the pancreatic gland consistent with stigmata of chronic pancreatitis. No acute pancreatic inflammation is identified. No ductal dilatation or mass is noted. Spleen: Normal Adrenals/Urinary Tract: Normal bilateral adrenal glands. No obstructive uropathy. Tiny too small to characterize subcentimeter hypodensities within the interpolar and lower poles of both kidneys statistically consistent with cysts. Stomach/Bowel: Moderate distention of the stomach with food. Normal small bowel rotation without bowel obstruction or inflammation. Vascular/Lymphatic: Aortic atherosclerosis with atherosclerosis at the origin of the SMA and both common iliac arteries. No aneurysm or dissection. Other: No free air nor free fluid. Musculoskeletal: Thoracolumbar spondylosis. No acute osseous abnormality. Review of the MIP images confirms the above findings. IMPRESSION: 1. No acute pulmonary embolus, aortic aneurysm or dissection. 2. Coronary arteriosclerosis with aortic atherosclerosis. 3. Mild peribronchial thickening with tiny tree-in-bud densities in the right upper lobe may reflect bronchitic change in bronchiolitis. 4. Stigmata of chronic pancreatitis with scattered calcifications noted within the pancreas. 5. 8 mm nonspecific hypodensity in the right hepatic dome likely to represent a small cyst or  hemangioma statistically. 6. Bilateral subcentimeter renal hypodensities statistically to represent small cysts as well. 7. Thoracolumbar spondylosis. Electronically Signed   By: Ashley Royalty M.D.   On: 09/07/2016 23:00    Echocardiogram.   Subjective: No chest pain, no sob. No nausea, or abd pain. No headache or dizziness  Discharge Exam: Vitals:   09/08/16 1142 09/08/16 1311  BP: 100/61 (!) 136/45  Pulse: (!) 58 63  Resp: 17 18  Temp: (!) 97 F (36.1 C) 97.7 F (36.5 C)   Vitals:   09/08/16 0756 09/08/16 0758 09/08/16 1142 09/08/16 1311  BP: (!) 89/77 (!) 126/48 100/61 (!) 136/45  Pulse: 69  (!) 58 63  Resp:   17 18  Temp: (!) 97.5 F (36.4 C)  (!) 97 F (36.1 C) 97.7 F (36.5 C)  TempSrc: Oral  Axillary Oral  SpO2: 100%  100% 99%  Weight:      Height:        General: Pt is alert, awake, not in acute distress Cardiovascular: RRR, S1/S2 +, no rubs, no gallops Respiratory: CTA bilaterally, no wheezing, no rhonchi Abdominal: Soft, NT, ND, bowel sounds + Extremities: no edema, no cyanosis    The results of significant diagnostics from this hospitalization (including imaging, microbiology, ancillary and laboratory) are listed below for reference.     Microbiology: Recent Results (from the past 240 hour(s))  MRSA PCR Screening     Status: None   Collection Time: 09/08/16 12:25 AM  Result Value Ref Range Status   MRSA by PCR NEGATIVE NEGATIVE Final    Comment:        The GeneXpert MRSA  Assay (FDA approved for NASAL specimens only), is one component of a comprehensive MRSA colonization surveillance program. It is not intended to diagnose MRSA infection nor to guide or monitor treatment for MRSA infections.      Labs: BNP (last 3 results) No results for input(s): BNP in the last 8760 hours. Basic Metabolic Panel:  Recent Labs Lab 09/07/16 1745  NA 140  K 4.2  CL 106  CO2 25  GLUCOSE 121*  BUN 14  CREATININE 1.01*  CALCIUM 9.2   Liver Function  Tests:  Recent Labs Lab 09/07/16 2026  AST 21  ALT 17  ALKPHOS 60  BILITOT 0.5  PROT 6.2*  ALBUMIN 3.2*    Recent Labs Lab 09/07/16 2026 09/08/16 0742  LIPASE 303* 72*   No results for input(s): AMMONIA in the last 168 hours. CBC:  Recent Labs Lab 09/07/16 1745  WBC 5.6  HGB 11.9*  HCT 36.9  MCV 77.2*  PLT 92*   Cardiac Enzymes:  Recent Labs Lab 09/07/16 2026 09/08/16 0222 09/08/16 0742  TROPONINI <0.03 <0.03 <0.03   BNP: Invalid input(s): POCBNP CBG:  Recent Labs Lab 09/08/16 0755 09/08/16 1141 09/08/16 1315  GLUCAP 77 62* 114*   D-Dimer  Recent Labs  09/07/16 2026  DDIMER 3.08*   Hgb A1c  Recent Labs  09/08/16 0222  HGBA1C 6.3*   Lipid Profile  Recent Labs  09/08/16 0222  CHOL 125  HDL 51  LDLCALC 65  TRIG 46  CHOLHDL 2.5   Thyroid function studies No results for input(s): TSH, T4TOTAL, T3FREE, THYROIDAB in the last 72 hours.  Invalid input(s): FREET3 Anemia work up No results for input(s): VITAMINB12, FOLATE, FERRITIN, TIBC, IRON, RETICCTPCT in the last 72 hours. Urinalysis    Component Value Date/Time   COLORURINE YELLOW 03/03/2014 1814   APPEARANCEUR CLEAR 03/03/2014 1814   LABSPEC 1.014 03/03/2014 1814   PHURINE 5.0 03/03/2014 1814   GLUCOSEU NEGATIVE 03/03/2014 1814   HGBUR TRACE (A) 03/03/2014 1814   BILIRUBINUR NEGATIVE 03/03/2014 1814   KETONESUR NEGATIVE 03/03/2014 1814   PROTEINUR NEGATIVE 03/03/2014 1814   UROBILINOGEN 0.2 03/03/2014 1814   NITRITE NEGATIVE 03/03/2014 1814   LEUKOCYTESUR TRACE (A) 03/03/2014 1814   Sepsis Labs Invalid input(s): PROCALCITONIN,  WBC,  LACTICIDVEN Microbiology Recent Results (from the past 240 hour(s))  MRSA PCR Screening     Status: None   Collection Time: 09/08/16 12:25 AM  Result Value Ref Range Status   MRSA by PCR NEGATIVE NEGATIVE Final    Comment:        The GeneXpert MRSA Assay (FDA approved for NASAL specimens only), is one component of a comprehensive  MRSA colonization surveillance program. It is not intended to diagnose MRSA infection nor to guide or monitor treatment for MRSA infections.      Time coordinating discharge: Over 30 minutes  SIGNED:   Hosie Poisson, MD  [facility] 09/09/2016, 8:16 AM Pager   If 7PM-7AM, please contact night-coverage [email or web address] Password [email or web address]

## 2016-09-16 DIAGNOSIS — D696 Thrombocytopenia, unspecified: Secondary | ICD-10-CM | POA: Diagnosis not present

## 2016-09-16 DIAGNOSIS — Z79899 Other long term (current) drug therapy: Secondary | ICD-10-CM | POA: Diagnosis not present

## 2016-09-26 DIAGNOSIS — L309 Dermatitis, unspecified: Secondary | ICD-10-CM | POA: Diagnosis not present

## 2016-09-27 ENCOUNTER — Other Ambulatory Visit (HOSPITAL_COMMUNITY): Payer: Self-pay | Admitting: Podiatry

## 2016-09-27 DIAGNOSIS — I739 Peripheral vascular disease, unspecified: Secondary | ICD-10-CM

## 2016-10-03 ENCOUNTER — Other Ambulatory Visit (HOSPITAL_COMMUNITY): Payer: Self-pay | Admitting: Podiatry

## 2016-10-03 ENCOUNTER — Ambulatory Visit (HOSPITAL_COMMUNITY)
Admission: RE | Admit: 2016-10-03 | Discharge: 2016-10-03 | Disposition: A | Discharge status: Home / Self Care | Payer: Medicare Other | Source: Ambulatory Visit | Attending: Cardiovascular Disease | Admitting: Cardiovascular Disease

## 2016-10-03 DIAGNOSIS — I739 Peripheral vascular disease, unspecified: Secondary | ICD-10-CM

## 2016-10-10 DIAGNOSIS — R079 Chest pain, unspecified: Secondary | ICD-10-CM | POA: Diagnosis not present

## 2016-10-10 DIAGNOSIS — D696 Thrombocytopenia, unspecified: Secondary | ICD-10-CM | POA: Diagnosis not present

## 2016-10-10 DIAGNOSIS — I7090 Unspecified atherosclerosis: Secondary | ICD-10-CM | POA: Diagnosis not present

## 2016-10-10 DIAGNOSIS — R143 Flatulence: Secondary | ICD-10-CM | POA: Diagnosis not present

## 2016-10-31 DIAGNOSIS — Z23 Encounter for immunization: Secondary | ICD-10-CM | POA: Diagnosis not present

## 2016-10-31 DIAGNOSIS — I1310 Hypertensive heart and chronic kidney disease without heart failure, with stage 1 through stage 4 chronic kidney disease, or unspecified chronic kidney disease: Secondary | ICD-10-CM | POA: Diagnosis not present

## 2016-10-31 DIAGNOSIS — N182 Chronic kidney disease, stage 2 (mild): Secondary | ICD-10-CM | POA: Diagnosis not present

## 2016-10-31 DIAGNOSIS — I7090 Unspecified atherosclerosis: Secondary | ICD-10-CM | POA: Diagnosis not present

## 2016-10-31 DIAGNOSIS — M25512 Pain in left shoulder: Secondary | ICD-10-CM | POA: Diagnosis not present

## 2016-11-11 DIAGNOSIS — S90819A Abrasion, unspecified foot, initial encounter: Secondary | ICD-10-CM | POA: Diagnosis not present

## 2016-11-11 DIAGNOSIS — E1351 Other specified diabetes mellitus with diabetic peripheral angiopathy without gangrene: Secondary | ICD-10-CM | POA: Diagnosis not present

## 2016-11-11 DIAGNOSIS — I739 Peripheral vascular disease, unspecified: Secondary | ICD-10-CM | POA: Diagnosis not present

## 2016-11-11 DIAGNOSIS — B351 Tinea unguium: Secondary | ICD-10-CM | POA: Diagnosis not present

## 2016-11-11 DIAGNOSIS — M79672 Pain in left foot: Secondary | ICD-10-CM | POA: Diagnosis not present

## 2016-11-11 DIAGNOSIS — M79671 Pain in right foot: Secondary | ICD-10-CM | POA: Diagnosis not present

## 2016-11-14 DIAGNOSIS — L309 Dermatitis, unspecified: Secondary | ICD-10-CM | POA: Diagnosis not present

## 2016-11-28 DIAGNOSIS — I7090 Unspecified atherosclerosis: Secondary | ICD-10-CM | POA: Diagnosis not present

## 2016-11-28 DIAGNOSIS — N182 Chronic kidney disease, stage 2 (mild): Secondary | ICD-10-CM | POA: Diagnosis not present

## 2016-11-28 DIAGNOSIS — E559 Vitamin D deficiency, unspecified: Secondary | ICD-10-CM | POA: Diagnosis not present

## 2016-11-28 DIAGNOSIS — N08 Glomerular disorders in diseases classified elsewhere: Secondary | ICD-10-CM | POA: Diagnosis not present

## 2016-11-28 DIAGNOSIS — I739 Peripheral vascular disease, unspecified: Secondary | ICD-10-CM | POA: Diagnosis not present

## 2016-11-28 DIAGNOSIS — E1122 Type 2 diabetes mellitus with diabetic chronic kidney disease: Secondary | ICD-10-CM | POA: Diagnosis not present

## 2016-11-28 DIAGNOSIS — I1310 Hypertensive heart and chronic kidney disease without heart failure, with stage 1 through stage 4 chronic kidney disease, or unspecified chronic kidney disease: Secondary | ICD-10-CM | POA: Diagnosis not present

## 2016-12-21 DIAGNOSIS — Z1231 Encounter for screening mammogram for malignant neoplasm of breast: Secondary | ICD-10-CM | POA: Diagnosis not present

## 2016-12-21 DIAGNOSIS — Z853 Personal history of malignant neoplasm of breast: Secondary | ICD-10-CM | POA: Diagnosis not present

## 2017-01-18 DIAGNOSIS — C50919 Malignant neoplasm of unspecified site of unspecified female breast: Secondary | ICD-10-CM | POA: Diagnosis not present

## 2017-01-18 DIAGNOSIS — Z853 Personal history of malignant neoplasm of breast: Secondary | ICD-10-CM | POA: Diagnosis not present

## 2017-01-30 DIAGNOSIS — E1151 Type 2 diabetes mellitus with diabetic peripheral angiopathy without gangrene: Secondary | ICD-10-CM | POA: Diagnosis not present

## 2017-01-30 DIAGNOSIS — M79672 Pain in left foot: Secondary | ICD-10-CM | POA: Diagnosis not present

## 2017-01-30 DIAGNOSIS — L84 Corns and callosities: Secondary | ICD-10-CM | POA: Diagnosis not present

## 2017-01-30 DIAGNOSIS — M79671 Pain in right foot: Secondary | ICD-10-CM | POA: Diagnosis not present

## 2017-01-30 DIAGNOSIS — B351 Tinea unguium: Secondary | ICD-10-CM | POA: Diagnosis not present

## 2017-02-03 DIAGNOSIS — E1165 Type 2 diabetes mellitus with hyperglycemia: Secondary | ICD-10-CM | POA: Diagnosis not present

## 2017-02-03 DIAGNOSIS — Z720 Tobacco use: Secondary | ICD-10-CM | POA: Diagnosis not present

## 2017-03-13 DIAGNOSIS — L309 Dermatitis, unspecified: Secondary | ICD-10-CM | POA: Diagnosis not present

## 2017-04-10 DIAGNOSIS — B351 Tinea unguium: Secondary | ICD-10-CM | POA: Diagnosis not present

## 2017-04-10 DIAGNOSIS — E1151 Type 2 diabetes mellitus with diabetic peripheral angiopathy without gangrene: Secondary | ICD-10-CM | POA: Diagnosis not present

## 2017-04-11 DIAGNOSIS — L309 Dermatitis, unspecified: Secondary | ICD-10-CM | POA: Diagnosis not present

## 2017-04-17 DIAGNOSIS — E1165 Type 2 diabetes mellitus with hyperglycemia: Secondary | ICD-10-CM | POA: Diagnosis not present

## 2017-04-17 DIAGNOSIS — I129 Hypertensive chronic kidney disease with stage 1 through stage 4 chronic kidney disease, or unspecified chronic kidney disease: Secondary | ICD-10-CM | POA: Diagnosis not present

## 2017-04-17 DIAGNOSIS — E1122 Type 2 diabetes mellitus with diabetic chronic kidney disease: Secondary | ICD-10-CM | POA: Diagnosis not present

## 2017-04-17 DIAGNOSIS — L209 Atopic dermatitis, unspecified: Secondary | ICD-10-CM | POA: Diagnosis not present

## 2017-04-17 DIAGNOSIS — N08 Glomerular disorders in diseases classified elsewhere: Secondary | ICD-10-CM | POA: Diagnosis not present

## 2017-04-17 DIAGNOSIS — N182 Chronic kidney disease, stage 2 (mild): Secondary | ICD-10-CM | POA: Diagnosis not present

## 2017-04-17 DIAGNOSIS — Z79899 Other long term (current) drug therapy: Secondary | ICD-10-CM | POA: Diagnosis not present

## 2017-05-04 ENCOUNTER — Other Ambulatory Visit: Payer: Self-pay

## 2017-05-04 ENCOUNTER — Encounter (HOSPITAL_COMMUNITY): Payer: Self-pay

## 2017-05-04 ENCOUNTER — Observation Stay (HOSPITAL_COMMUNITY)
Admission: EM | Admit: 2017-05-04 | Discharge: 2017-05-05 | Disposition: A | Discharge status: Home / Self Care | Payer: Medicare Other | Attending: Family Medicine | Admitting: Family Medicine

## 2017-05-04 DIAGNOSIS — T886XXA Anaphylactic reaction due to adverse effect of correct drug or medicament properly administered, initial encounter: Principal | ICD-10-CM | POA: Insufficient documentation

## 2017-05-04 DIAGNOSIS — Z882 Allergy status to sulfonamides status: Secondary | ICD-10-CM | POA: Diagnosis not present

## 2017-05-04 DIAGNOSIS — R0602 Shortness of breath: Secondary | ICD-10-CM | POA: Diagnosis not present

## 2017-05-04 DIAGNOSIS — Z880 Allergy status to penicillin: Secondary | ICD-10-CM | POA: Diagnosis not present

## 2017-05-04 DIAGNOSIS — Z7982 Long term (current) use of aspirin: Secondary | ICD-10-CM | POA: Diagnosis not present

## 2017-05-04 DIAGNOSIS — Z7984 Long term (current) use of oral hypoglycemic drugs: Secondary | ICD-10-CM | POA: Insufficient documentation

## 2017-05-04 DIAGNOSIS — Z888 Allergy status to other drugs, medicaments and biological substances status: Secondary | ICD-10-CM | POA: Insufficient documentation

## 2017-05-04 DIAGNOSIS — Z79899 Other long term (current) drug therapy: Secondary | ICD-10-CM | POA: Diagnosis not present

## 2017-05-04 DIAGNOSIS — E785 Hyperlipidemia, unspecified: Secondary | ICD-10-CM | POA: Diagnosis not present

## 2017-05-04 DIAGNOSIS — T370X5A Adverse effect of sulfonamides, initial encounter: Secondary | ICD-10-CM | POA: Diagnosis not present

## 2017-05-04 DIAGNOSIS — R21 Rash and other nonspecific skin eruption: Secondary | ICD-10-CM | POA: Diagnosis present

## 2017-05-04 DIAGNOSIS — X58XXXA Exposure to other specified factors, initial encounter: Secondary | ICD-10-CM | POA: Diagnosis not present

## 2017-05-04 DIAGNOSIS — Z853 Personal history of malignant neoplasm of breast: Secondary | ICD-10-CM | POA: Diagnosis not present

## 2017-05-04 DIAGNOSIS — I739 Peripheral vascular disease, unspecified: Secondary | ICD-10-CM | POA: Diagnosis not present

## 2017-05-04 DIAGNOSIS — I10 Essential (primary) hypertension: Secondary | ICD-10-CM | POA: Diagnosis not present

## 2017-05-04 DIAGNOSIS — Z720 Tobacco use: Secondary | ICD-10-CM | POA: Diagnosis not present

## 2017-05-04 DIAGNOSIS — E119 Type 2 diabetes mellitus without complications: Secondary | ICD-10-CM | POA: Diagnosis not present

## 2017-05-04 DIAGNOSIS — Z96641 Presence of right artificial hip joint: Secondary | ICD-10-CM | POA: Insufficient documentation

## 2017-05-04 DIAGNOSIS — F17210 Nicotine dependence, cigarettes, uncomplicated: Secondary | ICD-10-CM | POA: Insufficient documentation

## 2017-05-04 DIAGNOSIS — T782XXA Anaphylactic shock, unspecified, initial encounter: Secondary | ICD-10-CM | POA: Diagnosis not present

## 2017-05-04 HISTORY — DX: Gastro-esophageal reflux disease without esophagitis: K21.9

## 2017-05-04 HISTORY — DX: Unspecified osteoarthritis, unspecified site: M19.90

## 2017-05-04 HISTORY — DX: Obstructive sleep apnea (adult) (pediatric): G47.33

## 2017-05-04 HISTORY — DX: Type 2 diabetes mellitus without complications: E11.9

## 2017-05-04 HISTORY — DX: Malignant neoplasm of unspecified site of right female breast: C50.911

## 2017-05-04 HISTORY — DX: Dependence on other enabling machines and devices: Z99.89

## 2017-05-04 LAB — CBG MONITORING, ED: Glucose-Capillary: 141 mg/dL — ABNORMAL HIGH (ref 65–99)

## 2017-05-04 LAB — CBC WITH DIFFERENTIAL/PLATELET
Basophils Absolute: 0 10*3/uL (ref 0.0–0.1)
Basophils Relative: 0 %
Eosinophils Absolute: 0.1 10*3/uL (ref 0.0–0.7)
Eosinophils Relative: 1 %
HCT: 31.4 % — ABNORMAL LOW (ref 36.0–46.0)
Hemoglobin: 10.5 g/dL — ABNORMAL LOW (ref 12.0–15.0)
Lymphocytes Relative: 14 %
Lymphs Abs: 0.9 10*3/uL (ref 0.7–4.0)
MCH: 25.5 pg — ABNORMAL LOW (ref 26.0–34.0)
MCHC: 33.4 g/dL (ref 30.0–36.0)
MCV: 76.2 fL — ABNORMAL LOW (ref 78.0–100.0)
Monocytes Absolute: 0.4 10*3/uL (ref 0.1–1.0)
Monocytes Relative: 6 %
Neutro Abs: 5.5 10*3/uL (ref 1.7–7.7)
Neutrophils Relative %: 79 %
Platelets: 100 10*3/uL — ABNORMAL LOW (ref 150–400)
RBC: 4.12 MIL/uL (ref 3.87–5.11)
RDW: 18 % — ABNORMAL HIGH (ref 11.5–15.5)
WBC: 6.9 10*3/uL (ref 4.0–10.5)

## 2017-05-04 LAB — BASIC METABOLIC PANEL
Anion gap: 10 (ref 5–15)
BUN: 25 mg/dL — ABNORMAL HIGH (ref 6–20)
CO2: 22 mmol/L (ref 22–32)
Calcium: 8.8 mg/dL — ABNORMAL LOW (ref 8.9–10.3)
Chloride: 108 mmol/L (ref 101–111)
Creatinine, Ser: 1.39 mg/dL — ABNORMAL HIGH (ref 0.44–1.00)
GFR calc Af Amer: 38 mL/min — ABNORMAL LOW (ref >60)
GFR calc non Af Amer: 33 mL/min — ABNORMAL LOW (ref >60)
Glucose, Bld: 125 mg/dL — ABNORMAL HIGH (ref 65–99)
Potassium: 4.4 mmol/L (ref 3.5–5.1)
Sodium: 140 mmol/L (ref 135–145)

## 2017-05-04 LAB — MRSA PCR SCREENING: MRSA by PCR: NEGATIVE

## 2017-05-04 LAB — GLUCOSE, CAPILLARY
Glucose-Capillary: 251 mg/dL — ABNORMAL HIGH (ref 65–99)
Glucose-Capillary: 180 mg/dL — ABNORMAL HIGH (ref 65–99)

## 2017-05-04 LAB — HEMOGLOBIN A1C
Hgb A1c MFr Bld: 6.3 % — ABNORMAL HIGH (ref 4.8–5.6)
Mean Plasma Glucose: 134.11 mg/dL

## 2017-05-04 MED ORDER — DIPHENHYDRAMINE HCL 50 MG/ML IJ SOLN
25.0000 mg | Freq: Four times a day (QID) | INTRAMUSCULAR | Status: DC
  Administered 2017-05-04 – 2017-05-05 (×3): 25 mg via INTRAVENOUS
  Filled 2017-05-04 (×4): qty 1

## 2017-05-04 MED ORDER — IPRATROPIUM-ALBUTEROL 0.5-2.5 (3) MG/3ML IN SOLN: 3.0000 mL | RESPIRATORY_TRACT | Status: DC | PRN

## 2017-05-04 MED ORDER — INSULIN ASPART 100 UNIT/ML ~~LOC~~ SOLN
0.0000 [IU] | Freq: Three times a day (TID) | SUBCUTANEOUS | Status: DC
  Administered 2017-05-04: 2 [IU] via SUBCUTANEOUS
  Administered 2017-05-04: 8 [IU] via SUBCUTANEOUS
  Administered 2017-05-05 (×2): 3 [IU] via SUBCUTANEOUS
  Filled 2017-05-04: qty 1

## 2017-05-04 MED ORDER — ONDANSETRON HCL 4 MG/2ML IJ SOLN: 4.0000 mg | Freq: Four times a day (QID) | INTRAMUSCULAR | Status: DC | PRN

## 2017-05-04 MED ORDER — ENOXAPARIN SODIUM 30 MG/0.3ML ~~LOC~~ SOLN
30.0000 mg | SUBCUTANEOUS | Status: DC
  Administered 2017-05-04: 30 mg via SUBCUTANEOUS
  Filled 2017-05-04: qty 0.3

## 2017-05-04 MED ORDER — INSULIN ASPART 100 UNIT/ML ~~LOC~~ SOLN: 0.0000 [IU] | Freq: Every day | SUBCUTANEOUS | Status: DC

## 2017-05-04 MED ORDER — ACETAMINOPHEN 650 MG RE SUPP: 650.0000 mg | Freq: Four times a day (QID) | RECTAL | Status: DC | PRN

## 2017-05-04 MED ORDER — LACTATED RINGERS IV BOLUS (SEPSIS)
1000.0000 mL | Freq: Once | INTRAVENOUS | Status: AC
  Administered 2017-05-04: 1000 mL via INTRAVENOUS

## 2017-05-04 MED ORDER — FAMOTIDINE IN NACL 20-0.9 MG/50ML-% IV SOLN
20.0000 mg | Freq: Two times a day (BID) | INTRAVENOUS | Status: DC
  Administered 2017-05-04: 20 mg via INTRAVENOUS
  Filled 2017-05-04 (×3): qty 50

## 2017-05-04 MED ORDER — METHYLPREDNISOLONE SODIUM SUCC 125 MG IJ SOLR
80.0000 mg | Freq: Four times a day (QID) | INTRAMUSCULAR | Status: DC
  Administered 2017-05-04 – 2017-05-05 (×3): 80 mg via INTRAVENOUS
  Filled 2017-05-04 (×3): qty 2

## 2017-05-04 MED ORDER — TIOTROPIUM BROMIDE MONOHYDRATE 18 MCG IN CAPS
18.0000 ug | ORAL_CAPSULE | Freq: Every day | RESPIRATORY_TRACT | Status: DC | PRN
  Filled 2017-05-04: qty 5

## 2017-05-04 MED ORDER — DOCUSATE SODIUM 100 MG PO CAPS
100.0000 mg | ORAL_CAPSULE | Freq: Two times a day (BID) | ORAL | Status: DC
  Administered 2017-05-05: 100 mg via ORAL
  Filled 2017-05-04 (×2): qty 1

## 2017-05-04 MED ORDER — ONDANSETRON HCL 4 MG PO TABS: 4.0000 mg | ORAL_TABLET | Freq: Four times a day (QID) | ORAL | Status: DC | PRN

## 2017-05-04 MED ORDER — ASPIRIN EC 81 MG PO TBEC
81.0000 mg | DELAYED_RELEASE_TABLET | Freq: Every day | ORAL | Status: DC
  Administered 2017-05-05: 81 mg via ORAL
  Filled 2017-05-04: qty 1

## 2017-05-04 MED ORDER — ACETAMINOPHEN 325 MG PO TABS: 650.0000 mg | ORAL_TABLET | Freq: Four times a day (QID) | ORAL | Status: DC | PRN

## 2017-05-04 MED ORDER — DIPHENHYDRAMINE HCL 50 MG/ML IJ SOLN
25.0000 mg | Freq: Once | INTRAMUSCULAR | Status: AC
  Administered 2017-05-04: 25 mg via INTRAVENOUS
  Filled 2017-05-04: qty 1

## 2017-05-04 MED ORDER — FAMOTIDINE IN NACL 20-0.9 MG/50ML-% IV SOLN
20.0000 mg | Freq: Once | INTRAVENOUS | Status: AC
  Administered 2017-05-04: 20 mg via INTRAVENOUS
  Filled 2017-05-04: qty 50

## 2017-05-04 MED ORDER — SODIUM CHLORIDE 0.9% FLUSH
3.0000 mL | Freq: Two times a day (BID) | INTRAVENOUS | Status: DC
  Administered 2017-05-04: 3 mL via INTRAVENOUS

## 2017-05-04 MED ORDER — EPINEPHRINE 0.3 MG/0.3ML IJ SOAJ
0.3000 mg | Freq: Once | INTRAMUSCULAR | Status: AC
  Administered 2017-05-04: 0.3 mg via INTRAMUSCULAR
  Filled 2017-05-04: qty 0.3

## 2017-05-04 MED ORDER — METHYLPREDNISOLONE SODIUM SUCC 125 MG IJ SOLR
125.0000 mg | Freq: Once | INTRAMUSCULAR | Status: AC
  Administered 2017-05-04: 125 mg via INTRAVENOUS
  Filled 2017-05-04: qty 2

## 2017-05-04 MED ORDER — EPINEPHRINE 0.3 MG/0.3ML IJ SOAJ
0.3000 mg | Freq: Once | INTRAMUSCULAR | Status: DC | PRN
  Filled 2017-05-04: qty 0.3

## 2017-05-04 NOTE — H&P (Signed)
History and Physical    Leah Wiggins JQB:[id number] DOB: 10/12/1926 DOA: 05/04/2017  PCP: Glendale Chard, MD Consultants:  Gwenlyn Found - cardiology Patient coming from:  Home - lives with son; Donald Prose: daughter, [phone]  Chief Complaint: tongue swelling  HPI: Leah Wiggins is a 84 y.o. female with medical history significant of tobacco dependence; PVD; HTN; HLD; DM; and h/o breast cancer presenting with tongue swelling that developed this AM.  Feels tightness through her neck.  She has had a rash ongoing "a good while".  The rash was on her feet, but that got better and then it broke out all over this time, maybe a month ago or longer.  The tongue swelling started today.  She was started on a new medication since Monday - Bactrim, has had 2 pills.  Denies SOB other than intermittent chronic SOB.  Facial itching and head itching but this also seems to be chronic.  She has not had anything to drink in the ER so it uncertain if she has dysphagia.   ED Course:   Angioedema vs. Anaphylaxis.  She woke up with a swollen tongue, now improved but still ongoing.  She feels pressure over her neck.  No drooling, but difficulty swallowing.  She just started Bactrim yesterday - 2 doses.  Epipen, H1/2 blockers, steroids without significant improvement.  Observation for 12-24 hours.  Review of Systems: As per HPI; otherwise review of systems reviewed and negative.   Ambulatory Status:  Ambulates with a cane  Past Medical History:  Diagnosis Date  . Breast cancer ([place]) 1991  . Diabetes mellitus   . Hyperlipidemia   . Hypertension   . Non-stress test nonreactive, 04/24/12, normal 12/10/2012  . PVD (peripheral vascular disease) ([place])   . Tobacco use 12/10/2012    Past Surgical History:  Procedure Laterality Date  . CARDIOVASCULAR STRESS TEST  04/24/2012   No significant ST segment change suggestive of ischemia.  . LOWER EXTREMITY ARTERIAL DOPPLER Bilateral 05/07/2012   Left ABI-demonstrated mild  arterial insufficiency. Right CIA 50-69% diameter reduction. Right SFA less than 50% diameter reduction. Left SFA 70-99% diameter reduction. Bilateral Runoff-Posterior tibial arteries appeared occluded.  Marland Kitchen MASTECTOMY PARTIAL / LUMPECTOMY W/ AXILLARY LYMPHADENECTOMY Right 1991   . TOTAL HIP ARTHROPLASTY Right 2011    Social History   Socioeconomic History  . Marital status: Widowed    Spouse name: Not on file  . Number of children: Not on file  . Years of education: Not on file  . Highest education level: Not on file  Social Needs  . Financial resource strain: Not on file  . Food insecurity - worry: Not on file  . Food insecurity - inability: Not on file  . Transportation needs - medical: Not on file  . Transportation needs - non-medical: Not on file  Occupational History  . Not on file  Tobacco Use  . Smoking status: Current Some Day Smoker    Packs/day: 0.25    Years: 65.00    Pack years: 16.25    Types: Cigarettes    Start date: 03/08/1947  . Smokeless tobacco: Never Used  Substance and Sexual Activity  . Alcohol use: No    Alcohol/week: 0.0 oz  . Drug use: No  . Sexual activity: No  Other Topics Concern  . Not on file  Social History Narrative  . Not on file    Allergies  Allergen Reactions  . Bactrim [Sulfamethoxazole-Trimethoprim] Swelling    Marked angioedema requiring hospitalization  . Crestor [  Rosuvastatin Calcium] Other (See Comments)    myalgias  . Pravastatin Other (See Comments)    Myalgias and weakness  . Penicillins Other (See Comments)    Unknown Has patient had a PCN reaction causing immediate rash, facial/tongue/throat swelling, SOB or lightheadedness with hypotension: No Has patient had a PCN reaction causing severe rash involving mucus membranes or skin necrosis: No Has patient had a PCN reaction that required hospitalization: No Has patient had a PCN reaction occurring within the last 10 years: No If all of the above answers are "NO", then may  proceed with Cephalosporin use.    Family History  Problem Relation Age of Onset  . Heart disease Mother   . Stroke Mother   . Stroke Father   . Cancer Sister        GIST  . Cancer Brother        thyroid   . Diabetes Son   . Stroke Son     Prior to Admission medications   Medication Sig Start Date End Date Taking? Authorizing Provider  alendronate (FOSAMAX) 70 MG tablet Take 70 mg by mouth every 7 (seven) days. Take with a full glass of water on an empty stomach. Wednesdays    [provider]  aspirin EC 81 MG tablet Take 81 mg by mouth daily.    [provider]  azithromycin (ZITHROMAX) 500 MG tablet Take 1 tablet (500 mg total) by mouth daily. 09/08/16   Hosie Poisson, MD  clotrimazole-betamethasone (LOTRISONE) cream Apply to affected area 2 times daily prn Patient taking differently: Apply 1 application topically 2 (two) times daily. Apply to affected area 2 times daily prn On her feet 10/31/15   Janne Napoleon, NP  fluocinonide ointment (LIDEX) 1.61 % Apply 1 application topically 2 (two) times daily. 08/08/16   [provider]  hydrOXYzine (ATARAX/VISTARIL) 25 MG tablet Take 1 tablet (25 mg total) by mouth every 6 (six) hours. 08/09/16   Lysbeth Penner, FNP  ketoconazole (NIZORAL) 2 % cream Apply 1 application topically 2 (two) times daily. Irritation on feet 12/17/15   [provider]  metFORMIN (GLUCOPHAGE-XR) 500 MG 24 hr tablet Take 500 mg by mouth 2 (two) times daily.    [provider]  mometasone (ELOCON) 0.1 % lotion Apply 1 application topically daily as needed (IRRITATION).  04/15/14   [provider]  pantoprazole (PROTONIX) 40 MG tablet Take 1 tablet (40 mg total) by mouth daily at 12 noon. 03/04/14   Elgergawy, Silver Huguenin, MD  pravastatin (PRAVACHOL) 40 MG tablet Take 40 mg by mouth daily.  11/10/15   [provider]  SPIRIVA HANDIHALER 18 MCG inhalation capsule Place 18 mcg into inhaler and inhale daily as needed  (shortness of breath).  11/05/11   [provider]  triamcinolone cream (KENALOG) 0.1 % Apply 1 application topically 2 (two) times daily. 08/08/16   [provider]  valsartan (DIOVAN) 160 MG tablet Take 160 mg by mouth daily.      [provider]    Physical Exam: Vitals:   05/04/17 0945 05/04/17 1000 05/04/17 1015 05/04/17 1030  BP: (!) 122/48 (!) 105/39 (!) 91/37 (!) 104/35  Pulse: 86 80 78 75  Resp: 17 17 15  (!) 22  Temp:      TempSrc:      SpO2: 98% 98% 97% 98%  Weight:      Height:         General:  Appears calm and comfortable and is NAD  without apparent respiratory distress or audible snoring/breathing Eyes:  PERRL, EOMI, normal lids, iris ENT: hard of hearing, very large tongue that fits into her mouth while closed but is clearly space-occupying.  There is marked fullness along the base on her tongue in her submandibular area, as well. Neck:  no LAD, masses or thyromegaly; she appears to have an open airway without any obvious compromise Cardiovascular:  RRR, no m/r/g. No LE edema.  Respiratory:   CTA bilaterally with no wheezes/rales/rhonchi.  Normal respiratory effort. Abdomen:  soft, NT, ND, NABS Skin: despite c/o diffuse rash, there in no appreciable rash (other than mild dry skin) on arms or body other than on lower extremities.  There is a scaly, lichenified rash with underlying erythema on the R > L foot with some reticular appearance along the anterior shin again R > L.     Musculoskeletal:  grossly normal tone BUE/BLE, good ROM, no bony abnormality Lower extremity:  No LE edema.  Limited foot exam with no ulcerations.  2+ distal pulses. Psychiatric:  grossly normal mood and affect, speech fluent and appropriate, AOx3.  She is somnolent from the medications provided. Neurologic:  CN 2-12 grossly intact, moves all extremities in coordinated fashion, sensation intact    Radiological Exams on Admission: No results found.  EKG: not  done  Labs on Admission: I have personally reviewed the available labs and imaging studies at the time of the admission.  Pertinent labs:   Glucose 125 BUN 25/Creatinine 1.39/GFR 38; 14/1.01/56 in 7/18 Hgb 10.5; 11.9 in 7/18 Platelets 100; 92 in 7/18  Assessment/Plan Principal Problem:   Anaphylaxis Active Problems:   Dyslipidemia   Tobacco use   Essential hypertension   Diabetes mellitus without complication (HCC)   Rash and nonspecific skin eruption   Anaphylaxis -Patient with apparent angioedema that is most likely associated with anaphylaxis from initiation of Bactrim -She does not appear to have a new rash -She does demonstrate marked tongue swelling and submandibular edema without obvious airway compromise at this time -I have consulted anesthesiology to make them aware of the patient; they will evaluate her in case of progression of airway compromise and need for intubation -She was treated in the ER with Benadryl; Pepcid; Epipen; and Solumedrol.  All of these medications have been continued. -Will observe overnight in the SDU.  If her tongue edema is improving, she is likely appropriate for d/c tomorrow. -Bactrim has been added to her allergy list. -Because this appears to be drug-induced angioedema without urticaria, a C1 esterase inhibitor does not appear to be indicated at this time. -Of note, she is taking Diovan but this is a chronic medication for which she has had no prior difficulty.  Would suggest administration of this medication in the hospital tomorrow AM prior to d/c to ensure no reaction, but at this time it seems less likely as the cause of her reaction. -Will resume Spiriva tomorrow but for now offer Duonebs q4h prn SOB/wheezing  DM -Will check A1c -hold Glucophage -Cover with moderate-scale SSI - particularly in light of high-dose steroids being given for angioedema  HTN -Currently with low BP, likely associated with sedating medications -Hold Norvasc  and Diovan in an effort to avoid further confusion in this circumstance  HLD -Hold Pravachol for now  Rash -No rash appreciated other than on primarily feet and to a lesser extent on the lower extremities -Appears to be unrelated to today's episode -Will hold topical therapies (as well as PO home medications) for  now to avoid confusion about the underlying cause of the anaphylaxis  Tobacco dependence -Encourage cessation.  This was discussed with the patient and should be reviewed on an ongoing basis.  -Declined patch  DVT prophylaxis:  Lovenox Code Status:  Full - confirmed with patient/family Family Communication: Daughter-in-law present throughout evaluation Disposition Plan:  Home once clinically improved Consults called: Anesthesia  Admission status: It is my clinical opinion that referral for OBSERVATION is reasonable and necessary in this patient based on the above information provided. The aforementioned taken together are felt to place the patient at high risk for further clinical deterioration. However it is anticipated that the patient may be medically stable for discharge from the hospital within 24 to 48 hours.  Total critical care time: 55 minutes Critical care time was exclusive of separately billable procedures and treating other patients. Critical care was necessary to treat or prevent imminent or life-threatening deterioration. Critical care was time spent personally by me on the following activities: development of treatment plan with patient and/or surrogate as well as nursing, discussions with consultants, evaluation of patient's response to treatment, examination of patient, obtaining history from patient or surrogate, ordering and performing treatments and interventions, ordering and review of laboratory studies, ordering and review of radiographic studies, pulse oximetry and re-evaluation of patient's condition.   Karmen Bongo MD [facility]  If note is  complete, please contact covering daytime or nighttime physician. [email or web address] Password [facility]  05/04/2017, 11:05 AM

## 2017-05-04 NOTE — Consult Note (Signed)
Anesthesiology:  84 year old female admitted with possible angioedema vs anaphlaxis and tongue swelling. On diovan. Now awake and alert, respirations unlabored. Mild to moderate tongue edema.  Discussed patient, need for possible intubation if airway edema worsens. She appears stable at the present time.  Leah Wiggins

## 2017-05-04 NOTE — ED Triage Notes (Signed)
Pt here today for allergic reaction and oral swelling. Pt states she began taking bactrim yesterday and today she woke up with oral swelling. Tongue noted to be markedly swollen, speech slurred. Maintaining secretions at this time.

## 2017-05-04 NOTE — ED Notes (Signed)
Patient ambulatory to bathroom with steady gait at this time with assistance from staff

## 2017-05-04 NOTE — ED Notes (Signed)
Pt given lunch by food services, pt NPO. Pt had a couple bites of [place] before this RN took meal tray away. Pt understanding and cooperative

## 2017-05-04 NOTE — ED Provider Notes (Addendum)
[facility] EMERGENCY DEPARTMENT Provider Note   CSN: [id number] Arrival date & time: 05/04/17  7564     History   Chief Complaint Chief Complaint  Patient presents with  . Allergic Reaction  . Angioedema    HPI Leah Wiggins is a 84 y.o. female.  HPI 84 year old female with history of diabetes, hyperlipidemia, hypertension comes in with chief complaint of swelling of her throat. Patient states that she woke up this morning with swelling of the throat in the tongue.  Patient denies any drooling, however she is feeling a little short of breath.  Patient has no history of similar reaction in the past.  Patient is allergic to Crestor and penicillin, she was started on Bactrim y'day for skin infection.  Patient denies any new rash or itching.  Past Medical History:  Diagnosis Date  . Breast cancer ([place]) 1991  . Diabetes mellitus   . Hyperlipidemia   . Hypertension   . Non-stress test nonreactive, 04/24/12, normal 12/10/2012  . PVD (peripheral vascular disease) ([place])   . Tobacco use 12/10/2012    Patient Active Problem List   Diagnosis Date Noted  . Chronic pancreatitis ([place]) 09/08/2016  . History of breast cancer 08/14/2014  . Empty sella ([place]) 07/14/2014  . Pain in the chest 04/28/2014  . Diabetes mellitus without complication ([place]) 33/29/5188  . Chest pain 03/03/2014  . Essential hypertension 06/24/2013  . PAD (peripheral artery disease), abnormal ABIs 12/10/2012  . Non-stress test nonreactive, 04/24/12, normal 12/10/2012  . Dyslipidemia 12/10/2012  . Tobacco use 12/10/2012  . Thrombocytopenia ([place]) 01/24/2012  . RESTLESS LEG SYNDROME 02/16/2010  . METHICILLIN SUSECPTIBLE PNEUMONIA STAPH AUREUS 01/13/2010  . SEPTIC ARTHRITIS 01/13/2010  . HIP THI LEG&ANK ABRASION/FRICION BURN W/O INF 01/13/2010    Past Surgical History:  Procedure Laterality Date  . CARDIOVASCULAR STRESS TEST  04/24/2012   No significant ST segment change suggestive of  ischemia.  . LOWER EXTREMITY ARTERIAL DOPPLER Bilateral 05/07/2012   Left ABI-demonstrated mild arterial insufficiency. Right CIA 50-69% diameter reduction. Right SFA less than 50% diameter reduction. Left SFA 70-99% diameter reduction. Bilateral Runoff-Posterior tibial arteries appeared occluded.  Marland Kitchen MASTECTOMY PARTIAL / LUMPECTOMY W/ AXILLARY LYMPHADENECTOMY Right 1991   . TOTAL HIP ARTHROPLASTY Right 2011    OB History    No data available       Home Medications    Prior to Admission medications   Medication Sig Start Date End Date Taking? Authorizing Provider  alendronate (FOSAMAX) 70 MG tablet Take 70 mg by mouth every 7 (seven) days. Take with a full glass of water on an empty stomach. Wednesdays    [provider]  aspirin EC 81 MG tablet Take 81 mg by mouth daily.    [provider]  azithromycin (ZITHROMAX) 500 MG tablet Take 1 tablet (500 mg total) by mouth daily. 09/08/16   Hosie Poisson, MD  clotrimazole-betamethasone (LOTRISONE) cream Apply to affected area 2 times daily prn Patient taking differently: Apply 1 application topically 2 (two) times daily. Apply to affected area 2 times daily prn On her feet 10/31/15   Janne Napoleon, NP  fluocinonide ointment (LIDEX) 4.16 % Apply 1 application topically 2 (two) times daily. 08/08/16   [provider]  hydrOXYzine (ATARAX/VISTARIL) 25 MG tablet Take 1 tablet (25 mg total) by mouth every 6 (six) hours. 08/09/16   Lysbeth Penner, FNP  ketoconazole (NIZORAL) 2 % cream Apply 1 application topically 2 (two) times daily. Irritation on feet 12/17/15  [provider]  metFORMIN (GLUCOPHAGE-XR) 500 MG 24 hr tablet Take 500 mg by mouth 2 (two) times daily.    [provider]  mometasone (ELOCON) 0.1 % lotion Apply 1 application topically daily as needed (IRRITATION).  04/15/14   [provider]  pantoprazole (PROTONIX) 40 MG tablet Take 1 tablet (40 mg total) by mouth daily at 12 noon. 03/04/14    Elgergawy, Silver Huguenin, MD  pravastatin (PRAVACHOL) 40 MG tablet Take 40 mg by mouth daily.  11/10/15   [provider]  SPIRIVA HANDIHALER 18 MCG inhalation capsule Place 18 mcg into inhaler and inhale daily as needed (shortness of breath).  11/05/11   [provider]  triamcinolone cream (KENALOG) 0.1 % Apply 1 application topically 2 (two) times daily. 08/08/16   [provider]  valsartan (DIOVAN) 160 MG tablet Take 160 mg by mouth daily.      [provider]    Family History Family History  Problem Relation Age of Onset  . Heart disease Mother   . Stroke Mother   . Stroke Father   . Cancer Sister        GIST  . Cancer Brother        thyroid   . Diabetes Son   . Stroke Son     Social History Social History   Tobacco Use  . Smoking status: Current Some Day Smoker    Packs/day: 0.25    Years: 65.00    Pack years: 16.25    Types: Cigarettes    Start date: 03/08/1947  . Smokeless tobacco: Never Used  . Tobacco comment: using nicoderm patches to quit smoking  Substance Use Topics  . Alcohol use: No    Alcohol/week: 0.0 oz  . Drug use: No     Allergies   Crestor [rosuvastatin calcium]; Pravastatin; and Penicillins   Review of Systems Review of Systems  Constitutional: Positive for activity change.  Respiratory: Positive for shortness of breath. Negative for cough and choking.   Skin: Positive for rash.  Allergic/Immunologic: Negative for environmental allergies and food allergies.  All other systems reviewed and are negative.    Physical Exam Updated Vital Signs BP (!) 146/50   Pulse 85   Temp 98.7 F (37.1 C) (Oral)   Resp (!) 24   Ht 5\' 3"  (1.6 m)   Wt 74.4 kg (164 lb)   SpO2 100%   BMI 29.05 kg/m   Physical Exam  Constitutional: She is oriented to person, place, and time. She appears well-developed.  HENT:  Head: Normocephalic and atraumatic.  Generalized edema of the tongue.  Lower lip also appears edematous  Eyes:  EOM are normal.  Neck: Normal range of motion. Neck supple.  Cardiovascular: Normal rate.  Pulmonary/Chest: Effort normal. No stridor. No respiratory distress.  Abdominal: Bowel sounds are normal.  Neurological: She is alert and oriented to person, place, and time.  Skin: Skin is warm and dry.  Nursing note and vitals reviewed.    ED Treatments / Results  Labs (all labs ordered are listed, but only abnormal results are displayed) Labs Reviewed  BASIC METABOLIC PANEL - Abnormal; Notable for the following components:      Result Value   Glucose, Bld 125 (*)    BUN 25 (*)    Creatinine, Ser 1.39 (*)    Calcium 8.8 (*)    GFR calc non Af Amer 33 (*)    GFR calc Af Amer 38 (*)    All  other components within normal limits  CBC WITH DIFFERENTIAL/PLATELET - Abnormal; Notable for the following components:   Hemoglobin 10.5 (*)    HCT 31.4 (*)    MCV 76.2 (*)    MCH 25.5 (*)    RDW 18.0 (*)    Platelets 100 (*)    All other components within normal limits    EKG  EKG Interpretation None       Radiology No results found.  Procedures Procedures (including critical care time)  CRITICAL CARE Performed by: Erven Ramson   Total critical care time: 42 minutes  Critical care time was exclusive of separately billable procedures and treating other patients.  Critical care was necessary to treat or prevent imminent or life-threatening deterioration.  Critical care was time spent personally by me on the following activities: development of treatment plan with patient and/or surrogate as well as nursing, discussions with consultants, evaluation of patient's response to treatment, examination of patient, obtaining history from patient or surrogate, ordering and performing treatments and interventions, ordering and review of laboratory studies, ordering and review of radiographic studies, pulse oximetry and re-evaluation of patient's condition.   Medications Ordered in  ED Medications  lactated ringers bolus 1,000 mL (not administered)  EPINEPHrine (EPI-PEN) injection 0.3 mg (0.3 mg Intramuscular Given 05/04/17 0824)  diphenhydrAMINE (BENADRYL) injection 25 mg (25 mg Intravenous Given 05/04/17 0824)  famotidine (PEPCID) IVPB 20 mg premix (20 mg Intravenous New Bag/Given 05/04/17 0854)  methylPREDNISolone sodium succinate (SOLU-MEDROL) 125 mg/2 mL injection 125 mg (125 mg Intravenous Given 05/04/17 0824)     Initial Impression / Assessment and Plan / ED Course  I have reviewed the triage vital signs and the nursing notes.  Pertinent labs & imaging results that were available during my care of the patient were reviewed by me and considered in my medical decision making (see chart for details).  Clinical Course as of May 04 949  Thu May 04, 2017  0944 Pt resting comfortably, but she doesn't have significant improvement in her tongue swelling and she is having some neck discomfort and difficulty with swallowing. Will admit as obs.   [AN]    Clinical Course User Index [AN] Varney Biles, MD    84 year old female comes in with chief complaint of tongue swelling.  Patient states that she woke up with significant swelling of her tongue, with protrusion of the tongue.  However over the past few minutes her swelling has improved.  Patient is having mild DIB, but she has no wheezing, no stridulous breath sounds, and she is not drooling.  Patient was started on Bactrim yesterday, it is possible that she has anaphylaxis reaction from Bactrim.  Patient will be getting EpiPen IM, and IV Solu-Medrol, Benadryl, Pepcid.  We will reassess patient.  She will need at least 4-hour ER observation, possibly even admission for further monitoring.  Additionally we are considering angioedema from C1 esterase deficiency, but seeing it at age 6 is highly unlikely. Patient is not on ACE.  Final Clinical Impressions(s) / ED Diagnoses   Final diagnoses:  Anaphylaxis, initial  encounter    ED Discharge Orders    None           Varney Biles, MD 05/04/17 [phone]

## 2017-05-04 NOTE — ED Notes (Signed)
Pt's swelling is lips has noticeably improved.  Tongue is minimally improved.

## 2017-05-04 NOTE — ED Notes (Signed)
Pt reports starting Bactrim two days ago for rash.

## 2017-05-05 DIAGNOSIS — T782XXS Anaphylactic shock, unspecified, sequela: Secondary | ICD-10-CM

## 2017-05-05 DIAGNOSIS — T886XXA Anaphylactic reaction due to adverse effect of correct drug or medicament properly administered, initial encounter: Secondary | ICD-10-CM | POA: Diagnosis not present

## 2017-05-05 LAB — BASIC METABOLIC PANEL
Anion gap: 10 (ref 5–15)
BUN: 29 mg/dL — ABNORMAL HIGH (ref 6–20)
CO2: 20 mmol/L — ABNORMAL LOW (ref 22–32)
Calcium: 8.8 mg/dL — ABNORMAL LOW (ref 8.9–10.3)
Chloride: 109 mmol/L (ref 101–111)
Creatinine, Ser: 1.13 mg/dL — ABNORMAL HIGH (ref 0.44–1.00)
GFR calc Af Amer: 49 mL/min — ABNORMAL LOW (ref >60)
GFR calc non Af Amer: 42 mL/min — ABNORMAL LOW (ref >60)
Glucose, Bld: 202 mg/dL — ABNORMAL HIGH (ref 65–99)
Potassium: 4.5 mmol/L (ref 3.5–5.1)
Sodium: 139 mmol/L (ref 135–145)

## 2017-05-05 LAB — CBC
HCT: 29.7 % — ABNORMAL LOW (ref 36.0–46.0)
Hemoglobin: 9.6 g/dL — ABNORMAL LOW (ref 12.0–15.0)
MCH: 24.4 pg — ABNORMAL LOW (ref 26.0–34.0)
MCHC: 32.3 g/dL (ref 30.0–36.0)
MCV: 75.4 fL — ABNORMAL LOW (ref 78.0–100.0)
Platelets: 87 10*3/uL — ABNORMAL LOW (ref 150–400)
RBC: 3.94 MIL/uL (ref 3.87–5.11)
RDW: 17.4 % — ABNORMAL HIGH (ref 11.5–15.5)
WBC: 7.9 10*3/uL (ref 4.0–10.5)

## 2017-05-05 LAB — GLUCOSE, CAPILLARY
Glucose-Capillary: 165 mg/dL — ABNORMAL HIGH (ref 65–99)
Glucose-Capillary: 152 mg/dL — ABNORMAL HIGH (ref 65–99)

## 2017-05-05 MED ORDER — PREDNISONE 20 MG PO TABS: 60.0000 mg | ORAL_TABLET | Freq: Every day | ORAL | 0 refills | Status: DC

## 2017-05-05 MED ORDER — PREDNISONE 20 MG PO TABS
60.0000 mg | ORAL_TABLET | Freq: Every day | ORAL | Status: DC
  Administered 2017-05-05: 60 mg via ORAL
  Filled 2017-05-05: qty 3

## 2017-05-05 MED ORDER — EPINEPHRINE 0.3 MG/0.3ML IJ SOAJ: 0.3000 mg | Freq: Once | INTRAMUSCULAR | 0 refills | Status: DC | PRN

## 2017-05-05 MED ORDER — DIPHENHYDRAMINE HCL 25 MG PO CAPS: 25.0000 mg | ORAL_CAPSULE | Freq: Four times a day (QID) | ORAL | 0 refills | Status: DC | PRN

## 2017-05-05 MED ORDER — DIPHENHYDRAMINE HCL 25 MG PO CAPS: 25.0000 mg | ORAL_CAPSULE | Freq: Four times a day (QID) | ORAL | Status: DC | PRN

## 2017-05-05 NOTE — Discharge Summary (Signed)
Physician Discharge Summary  Leah Wiggins VQQ:[id number] DOB: November 22, 1926 DOA: 05/04/2017  PCP: Glendale Chard, MD  Admit date: 05/04/2017 Discharge date: 05/05/2017  Time spent: 22 minutes  Recommendations for Outpatient Follow-up:  1. Patient given prescription for EpiPen, prednisone, Benadryl on discharge and instructions to follow-up 2. Patient should follow with dermatologist is on Elocon as well as Lotrimin cream 3. May recommend and need breast cancer surveillance as an outpatient  Discharge Diagnoses:  Principal Problem:   Anaphylaxis Active Problems:   Dyslipidemia   Tobacco use   Essential hypertension   Diabetes mellitus without complication (HCC)   Rash and nonspecific skin eruption   Discharge Condition: Improved  Diet recommendation: Heart healthy  Filed Weights   05/04/17 0718  Weight: 74.4 kg (164 lb)    History of present illness:  84 year old female known history of PVD HTN HLD DM breast cancer developed tongue swelling after being prescribed Bactrim and had taken 2 pills on 2/27 Was placed on high-dose steroids IV Solu-Medrol in addition to IV H2 blockers famotidine etc. etc. On day of discharge this had completely resolved patient was able to swallow and phonate well and did not feel wheezing short of breath or nauseous   Hospital Course:  Angioedema-prescribed on discharge above medications Marked as allergy sulfa drug and will need outpatient follow-up and reassessment of the same Note have also discontinued ARB as feel risk of use outweighs benefit Discharge Exam: Vitals:   05/04/17 2333 05/05/17 0345  BP: (!) 134/55 (!) 121/53  Pulse: 72 67  Resp:  18  Temp: 98 F (36.7 C) 98 F (36.7 C)  SpO2: 98% 97%    General: Awake alert very hard of hearing Cardiovascular: S1-S2 no murmur rub or gallop Respiratory: Clinically clear no added sound Rash over face and feet-some darkening of skin on feet with clear demarcation however no scale and  no breach of integrity of skin  Discharge Instructions   Discharge Instructions    Diet - low sodium heart healthy   Complete by:  As directed    Discharge instructions   Complete by:  As directed    Do not take any further Diovan or Bactrim we have prescribed for you Benadryl for--itching and rash in addition to 5 days of steroids-please complete all 5 days of the steroids as you had an allergic reaction and this will help diminish the chances of this recurring Seek immediate medical attention if you develop shortness of breath or swelling of lips face and tongue   Discharge patient   Complete by:  As directed    Discharge disposition:  01-Home or Self Care   Discharge patient date:  05/05/2017   Increase activity slowly   Complete by:  As directed      Allergies as of 05/05/2017      Reactions   Bactrim [sulfamethoxazole-trimethoprim] Swelling   Marked angioedema requiring hospitalization   Crestor [rosuvastatin Calcium] Other (See Comments)   myalgias   Pravastatin Other (See Comments)   Myalgias and weakness   Penicillins Other (See Comments)   Unknown Has patient had a PCN reaction causing immediate rash, facial/tongue/throat swelling, SOB or lightheadedness with hypotension: No Has patient had a PCN reaction causing severe rash involving mucus membranes or skin necrosis: No Has patient had a PCN reaction that required hospitalization: No Has patient had a PCN reaction occurring within the last 10 years: No If all of the above answers are "NO", then may proceed with Cephalosporin use.  Medication List    STOP taking these medications   sulfamethoxazole-trimethoprim 800-160 MG tablet Commonly known as:  BACTRIM DS,SEPTRA DS   triamcinolone cream 0.1 % Commonly known as:  KENALOG   valsartan 160 MG tablet Commonly known as:  DIOVAN     TAKE these medications   alendronate 70 MG tablet Commonly known as:  FOSAMAX Take 70 mg by mouth every 7 (seven) days. Take  with a full glass of water on an empty stomach. Wednesdays   aspirin EC 81 MG tablet Take 81 mg by mouth daily.   clotrimazole-betamethasone cream Commonly known as:  LOTRISONE Apply to affected area 2 times daily prn What changed:    how much to take  how to take this  when to take this  additional instructions   diphenhydrAMINE 25 mg capsule Commonly known as:  BENADRYL Take 1 capsule (25 mg total) by mouth every 6 (six) hours as needed for allergies.   EPINEPHrine 0.3 mg/0.3 mL Soaj injection Commonly known as:  EPI-PEN Inject 0.3 mLs (0.3 mg total) into the muscle once as needed (prn worsening angioedema).   ferrous sulfate 325 (65 FE) MG tablet Take 325 mg by mouth daily with breakfast.   hydrOXYzine 25 MG tablet Commonly known as:  ATARAX/VISTARIL Take 1 tablet (25 mg total) by mouth every 6 (six) hours.   metFORMIN 500 MG 24 hr tablet Commonly known as:  GLUCOPHAGE-XR Take 500 mg by mouth 2 (two) times daily.   mometasone 0.1 % lotion Commonly known as:  ELOCON Apply 1 application topically daily as needed (IRRITATION).   pantoprazole 40 MG tablet Commonly known as:  PROTONIX Take 1 tablet (40 mg total) by mouth daily at 12 noon.   pravastatin 40 MG tablet Commonly known as:  PRAVACHOL Take 40 mg by mouth daily.   predniSONE 20 MG tablet Commonly known as:  DELTASONE Take 3 tablets (60 mg total) by mouth daily before breakfast.   SPIRIVA HANDIHALER 18 MCG inhalation capsule Generic drug:  tiotropium Place 18 mcg into inhaler and inhale daily as needed (shortness of breath).      Allergies  Allergen Reactions  . Bactrim [Sulfamethoxazole-Trimethoprim] Swelling    Marked angioedema requiring hospitalization  . Crestor [Rosuvastatin Calcium] Other (See Comments)    myalgias  . Pravastatin Other (See Comments)    Myalgias and weakness  . Penicillins Other (See Comments)    Unknown Has patient had a PCN reaction causing immediate rash,  facial/tongue/throat swelling, SOB or lightheadedness with hypotension: No Has patient had a PCN reaction causing severe rash involving mucus membranes or skin necrosis: No Has patient had a PCN reaction that required hospitalization: No Has patient had a PCN reaction occurring within the last 10 years: No If all of the above answers are "NO", then may proceed with Cephalosporin use.      The results of significant diagnostics from this hospitalization (including imaging, microbiology, ancillary and laboratory) are listed below for reference.    Significant Diagnostic Studies: No results found.  Microbiology: Recent Results (from the past 240 hour(s))  MRSA PCR Screening     Status: None   Collection Time: 05/04/17  4:37 PM  Result Value Ref Range Status   MRSA by PCR NEGATIVE NEGATIVE Final    Comment:        The GeneXpert MRSA Assay (FDA approved for NASAL specimens only), is one component of a comprehensive MRSA colonization surveillance program. It is not intended to diagnose MRSA infection nor to  guide or monitor treatment for MRSA infections. Performed at [facility], [place] [address], [place], [place] [address]      Labs: Basic Metabolic Panel: Recent Labs  Lab 05/04/17 0803 05/05/17 0425  NA 140 139  K 4.4 4.5  CL 108 109  CO2 22 20*  GLUCOSE 125* 202*  BUN 25* 29*  CREATININE 1.39* 1.13*  CALCIUM 8.8* 8.8*   Liver Function Tests: No results for input(s): AST, ALT, ALKPHOS, BILITOT, PROT, ALBUMIN in the last 168 hours. No results for input(s): LIPASE, AMYLASE in the last 168 hours. No results for input(s): AMMONIA in the last 168 hours. CBC: Recent Labs  Lab 05/04/17 0803 05/05/17 0425  WBC 6.9 7.9  NEUTROABS 5.5  --   HGB 10.5* 9.6*  HCT 31.4* 29.7*  MCV 76.2* 75.4*  PLT 100* 87*   Cardiac Enzymes: No results for input(s): CKTOTAL, CKMB, CKMBINDEX, TROPONINI in the last 168 hours. BNP: BNP (last 3 results) No results for input(s): BNP  in the last 8760 hours.  ProBNP (last 3 results) No results for input(s): PROBNP in the last 8760 hours.  CBG: Recent Labs  Lab 05/04/17 1154 05/04/17 1711 05/04/17 2115 05/05/17 0738  GLUCAP 141* 251* 180* 165*       Signed:  Nita Sells MD   [facility] 05/05/2017, 8:20 AM

## 2017-05-05 NOTE — Care Management Obs Status (Signed)
[facility] NOTIFICATION   Patient Details  Name: Leah Wiggins MRN: [id number] Date of Birth: 04-May-1926   Medicare Observation Status Notification Given:  Yes    Zenon Mayo, RN 05/05/2017, 1:03 PM

## 2017-05-15 DIAGNOSIS — I890 Lymphedema, not elsewhere classified: Secondary | ICD-10-CM | POA: Diagnosis not present

## 2017-05-15 DIAGNOSIS — R413 Other amnesia: Secondary | ICD-10-CM | POA: Diagnosis not present

## 2017-05-15 DIAGNOSIS — Z720 Tobacco use: Secondary | ICD-10-CM | POA: Diagnosis not present

## 2017-05-18 ENCOUNTER — Other Ambulatory Visit: Payer: Self-pay | Admitting: Nurse Practitioner

## 2017-05-18 DIAGNOSIS — R413 Other amnesia: Secondary | ICD-10-CM

## 2017-05-26 DIAGNOSIS — H60393 Other infective otitis externa, bilateral: Secondary | ICD-10-CM | POA: Diagnosis not present

## 2017-05-29 ENCOUNTER — Ambulatory Visit
Admission: RE | Admit: 2017-05-29 | Discharge: 2017-05-29 | Disposition: A | Discharge status: Home / Self Care | Payer: Medicare Other | Source: Ambulatory Visit | Attending: Nurse Practitioner | Admitting: Nurse Practitioner

## 2017-05-29 DIAGNOSIS — R51 Headache: Secondary | ICD-10-CM | POA: Diagnosis not present

## 2017-05-29 DIAGNOSIS — R413 Other amnesia: Secondary | ICD-10-CM | POA: Diagnosis not present

## 2017-06-01 ENCOUNTER — Emergency Department (HOSPITAL_COMMUNITY): Payer: Medicare Other

## 2017-06-01 ENCOUNTER — Emergency Department (HOSPITAL_COMMUNITY)
Admission: EM | Admit: 2017-06-01 | Discharge: 2017-06-02 | Disposition: A | Discharge status: Home / Self Care | Payer: Medicare Other | Attending: Emergency Medicine | Admitting: Emergency Medicine

## 2017-06-01 ENCOUNTER — Other Ambulatory Visit: Payer: Self-pay

## 2017-06-01 ENCOUNTER — Encounter (HOSPITAL_COMMUNITY): Payer: Self-pay | Admitting: Emergency Medicine

## 2017-06-01 DIAGNOSIS — Z7984 Long term (current) use of oral hypoglycemic drugs: Secondary | ICD-10-CM | POA: Diagnosis not present

## 2017-06-01 DIAGNOSIS — F17210 Nicotine dependence, cigarettes, uncomplicated: Secondary | ICD-10-CM | POA: Insufficient documentation

## 2017-06-01 DIAGNOSIS — R109 Unspecified abdominal pain: Secondary | ICD-10-CM | POA: Diagnosis not present

## 2017-06-01 DIAGNOSIS — R600 Localized edema: Secondary | ICD-10-CM | POA: Diagnosis not present

## 2017-06-01 DIAGNOSIS — I10 Essential (primary) hypertension: Secondary | ICD-10-CM | POA: Diagnosis not present

## 2017-06-01 DIAGNOSIS — Z96641 Presence of right artificial hip joint: Secondary | ICD-10-CM | POA: Insufficient documentation

## 2017-06-01 DIAGNOSIS — Z79899 Other long term (current) drug therapy: Secondary | ICD-10-CM | POA: Insufficient documentation

## 2017-06-01 DIAGNOSIS — E119 Type 2 diabetes mellitus without complications: Secondary | ICD-10-CM | POA: Diagnosis not present

## 2017-06-01 DIAGNOSIS — R6883 Chills (without fever): Secondary | ICD-10-CM | POA: Diagnosis present

## 2017-06-01 DIAGNOSIS — J111 Influenza due to unidentified influenza virus with other respiratory manifestations: Secondary | ICD-10-CM

## 2017-06-01 DIAGNOSIS — R509 Fever, unspecified: Secondary | ICD-10-CM | POA: Diagnosis not present

## 2017-06-01 LAB — URINALYSIS, ROUTINE W REFLEX MICROSCOPIC
Bilirubin Urine: NEGATIVE
Glucose, UA: NEGATIVE mg/dL
Ketones, ur: 5 mg/dL — AB
Nitrite: NEGATIVE
Protein, ur: NEGATIVE mg/dL
Specific Gravity, Urine: 1.014 (ref 1.005–1.030)
pH: 5 (ref 5.0–8.0)

## 2017-06-01 LAB — CBC WITH DIFFERENTIAL/PLATELET
Basophils Absolute: 0 10*3/uL (ref 0.0–0.1)
Basophils Relative: 0 %
Eosinophils Absolute: 0.1 10*3/uL (ref 0.0–0.7)
Eosinophils Relative: 1 %
HCT: 32.9 % — ABNORMAL LOW (ref 36.0–46.0)
Hemoglobin: 10.5 g/dL — ABNORMAL LOW (ref 12.0–15.0)
Lymphocytes Relative: 7 %
Lymphs Abs: 0.6 10*3/uL — ABNORMAL LOW (ref 0.7–4.0)
MCH: 24.1 pg — ABNORMAL LOW (ref 26.0–34.0)
MCHC: 31.9 g/dL (ref 30.0–36.0)
MCV: 75.6 fL — ABNORMAL LOW (ref 78.0–100.0)
Monocytes Absolute: 0.6 10*3/uL (ref 0.1–1.0)
Monocytes Relative: 7 %
Neutro Abs: 8 10*3/uL — ABNORMAL HIGH (ref 1.7–7.7)
Neutrophils Relative %: 85 %
Platelets: UNDETERMINED 10*3/uL (ref 150–400)
RBC: 4.35 MIL/uL (ref 3.87–5.11)
RDW: 17.2 % — ABNORMAL HIGH (ref 11.5–15.5)
WBC: 9.3 10*3/uL (ref 4.0–10.5)

## 2017-06-01 LAB — COMPREHENSIVE METABOLIC PANEL
ALT: 24 U/L (ref 14–54)
AST: 33 U/L (ref 15–41)
Albumin: 3.8 g/dL (ref 3.5–5.0)
Alkaline Phosphatase: 85 U/L (ref 38–126)
Anion gap: 13 (ref 5–15)
BUN: 14 mg/dL (ref 6–20)
CO2: 23 mmol/L (ref 22–32)
Calcium: 9.2 mg/dL (ref 8.9–10.3)
Chloride: 103 mmol/L (ref 101–111)
Creatinine, Ser: 0.88 mg/dL (ref 0.44–1.00)
GFR calc Af Amer: >60 mL/min (ref >60)
GFR calc non Af Amer: 57 mL/min — ABNORMAL LOW (ref >60)
Glucose, Bld: 83 mg/dL (ref 65–99)
Potassium: 4.3 mmol/L (ref 3.5–5.1)
Sodium: 139 mmol/L (ref 135–145)
Total Bilirubin: 0.5 mg/dL (ref 0.3–1.2)
Total Protein: 6.8 g/dL (ref 6.5–8.1)

## 2017-06-01 LAB — INFLUENZA PANEL BY PCR (TYPE A & B)
Influenza A By PCR: NEGATIVE
Influenza B By PCR: NEGATIVE

## 2017-06-01 LAB — I-STAT CG4 LACTIC ACID, ED: Lactic Acid, Venous: 1.13 mmol/L (ref 0.5–1.9)

## 2017-06-01 LAB — LACTIC ACID, PLASMA: Lactic Acid, Venous: 2.9 mmol/L (ref 0.5–1.9)

## 2017-06-01 MED ORDER — ACETAMINOPHEN 325 MG PO TABS
650.0000 mg | ORAL_TABLET | Freq: Once | ORAL | Status: AC
Start: 2017-06-01 — End: 2017-06-01
  Administered 2017-06-01: 650 mg via ORAL
  Filled 2017-06-01: qty 2

## 2017-06-01 MED ORDER — SODIUM CHLORIDE 0.9 % IV BOLUS
1000.0000 mL | Freq: Once | INTRAVENOUS | Status: AC
Start: 2017-06-01 — End: 2017-06-01
  Administered 2017-06-01: 1000 mL via INTRAVENOUS

## 2017-06-01 MED ORDER — OSELTAMIVIR PHOSPHATE 75 MG PO CAPS: 75.0000 mg | ORAL_CAPSULE | Freq: Two times a day (BID) | ORAL | 0 refills | Status: DC

## 2017-06-01 MED ORDER — IOPAMIDOL (ISOVUE-300) INJECTION 61%
INTRAVENOUS | Status: AC
  Administered 2017-06-01: 100 mL
  Filled 2017-06-01: qty 100

## 2017-06-01 NOTE — ED Triage Notes (Signed)
Patient brought in by family because patient began shaking at 2pm. Pt febrile in triage. They deny any cough, sore throat, or urinary symptoms. Patient denies pain, in no apparent distress. Resp e/u, skin warm/dry. Pt's family adds that she has had increased swelling in her legs, but that comes and goes.

## 2017-06-01 NOTE — ED Provider Notes (Signed)
Patient placed in Quick Look pathway, seen and evaluated   Chief Complaint: fever and shaking  HPI:   Leah Wiggins is a 84 y.o. female with hx of HTN, DM, PVD,m breast CA, and kidney disease brought to the ED by her family members. Patient's son reports that he went to the patient's home today at about 2:15 pm and noted that the patient was shaking and stated she was cold.   ROS: General: fever and chills   GU: frequent urination  M/S: leg swelling  Physical Exam:  BP (!) 161/62 (BP Location: Left Arm)   Pulse 97   Temp (!) 102.6 F (39.2 C) (Oral)   Resp 18   SpO2 100%     Gen: No distress  Neuro: Awake and Alert  Skin: increased warmth due to fever  M/S: swelling of lower legs  Focused Exam:   Initiation of care has begun. The patient has been counseled on the process, plan, and necessity for staying for the completion/evaluation, and the remainder of the medical screening examination    Ashley Murrain, NP 06/01/17 1607    Macarthur Critchley, MD 06/02/17 2006

## 2017-06-01 NOTE — Discharge Instructions (Signed)
Take tamiflu twice daily for 5 days.   Take tylenol, motrin for fever.   See your doctor for follow up   Return to ER if you have persistent fever for 3 days, vomiting, trouble breathing, severe abdominal pain, trouble breathing

## 2017-06-01 NOTE — ED Notes (Signed)
Dr. Thomasene Lot notified of elevated Lactic Acid

## 2017-06-01 NOTE — ED Notes (Signed)
ED Provider at bedside. 

## 2017-06-01 NOTE — ED Provider Notes (Signed)
[facility] EMERGENCY DEPARTMENT Provider Note   CSN: [id number] Arrival date & time: 06/01/17  1529     History   Chief Complaint Chief Complaint  Patient presents with  . Shaking    HPI Leah Wiggins is a 84 y.o. female history of hypertension, previous breast cancer, hyperlipidemia here presenting with shaking chills, abdominal pain.  She states that she has decreased appetite today and around 2 PM, she has some shaking chills and felt cold.  She has been having some chronic leg swelling occasionally gets worse.  Of note, patient was admitted about a month ago for possible anaphylaxis to Bactrim.  Patient denies any sick contacts.  The history is provided by the patient.    Past Medical History:  Diagnosis Date  . Arthritis    "shoulders" (05/04/2017)  . Breast cancer, right breast ([facility]) 1991  . GERD (gastroesophageal reflux disease)   . Hyperlipidemia   . Hypertension   . Non-stress test nonreactive, 04/24/12, normal 12/10/2012  . OSA on CPAP    "don't wear it all the time" (05/04/2017)  . PVD (peripheral vascular disease) ([facility])   . Tobacco use 12/10/2012  . Type II diabetes mellitus [facility])     Patient Active Problem List   Diagnosis Date Noted  . Anaphylaxis 05/04/2017  . Rash and nonspecific skin eruption 05/04/2017  . Chronic pancreatitis ([facility]) 09/08/2016  . History of breast cancer 08/14/2014  . Empty sella ([facility]) 07/14/2014  . Pain in the chest 04/28/2014  . Diabetes mellitus without complication ([facility]) 56/21/3086  . Chest pain 03/03/2014  . Essential hypertension 06/24/2013  . PAD (peripheral artery disease), abnormal ABIs 12/10/2012  . Non-stress test nonreactive, 04/24/12, normal 12/10/2012  . Dyslipidemia 12/10/2012  . Tobacco use 12/10/2012  . Thrombocytopenia ([facility]) 01/24/2012  . RESTLESS LEG SYNDROME 02/16/2010  . METHICILLIN SUSECPTIBLE PNEUMONIA STAPH AUREUS 01/13/2010  . SEPTIC ARTHRITIS 01/13/2010  . HIP THI LEG&ANK  ABRASION/FRICION BURN W/O INF 01/13/2010    Past Surgical History:  Procedure Laterality Date  . APPENDECTOMY    . BREAST BIOPSY Right 1991  . CARDIOVASCULAR STRESS TEST  04/24/2012   No significant ST segment change suggestive of ischemia.  Marland Kitchen CATARACT EXTRACTION, BILATERAL Bilateral   . JOINT REPLACEMENT    . LOWER EXTREMITY ARTERIAL DOPPLER Bilateral 05/07/2012   Left ABI-demonstrated mild arterial insufficiency. Right CIA 50-69% diameter reduction. Right SFA less than 50% diameter reduction. Left SFA 70-99% diameter reduction. Bilateral Runoff-Posterior tibial arteries appeared occluded.  Marland Kitchen MASTECTOMY PARTIAL / LUMPECTOMY W/ AXILLARY LYMPHADENECTOMY Right 1991   . TOTAL HIP ARTHROPLASTY Right 2011     OB History   None      Home Medications    Prior to Admission medications   Medication Sig Start Date End Date Taking? Authorizing Provider  alendronate (FOSAMAX) 70 MG tablet Take 70 mg by mouth every Wednesday. Take with a full glass of water on an empty stomach   Yes [provider]  aspirin EC 81 MG tablet Take 81 mg by mouth daily.   Yes [provider]  CIPRODEX OTIC suspension Place 4 drops into both eyes 2 (two) times daily. FOR 7-10 DAYS 05/26/17 06/04/17 Yes [provider]  diphenhydrAMINE (BENADRYL) 25 mg capsule Take 1 capsule (25 mg total) by mouth every 6 (six) hours as needed for allergies. 05/05/17  Yes Nita Sells, MD  EPINEPHrine 0.3 mg/0.3 mL IJ SOAJ injection Inject 0.3 mLs (0.3 mg total) into the muscle once as needed (prn  worsening angioedema). 05/05/17  Yes Nita Sells, MD  ferrous sulfate 325 (65 FE) MG tablet Take 325 mg by mouth daily with breakfast.   Yes [provider]  metFORMIN (GLUCOPHAGE-XR) 500 MG 24 hr tablet Take 500 mg by mouth 2 (two) times daily.   Yes [provider]  MICARDIS 40 MG tablet Take 40 mg by mouth daily. 04/17/17  Yes [provider]  mometasone (ELOCON) 0.1 % lotion  Apply 1 application topically daily as needed (IRRITATION).  04/15/14  Yes [provider]  pantoprazole (PROTONIX) 40 MG tablet Take 1 tablet (40 mg total) by mouth daily at 12 noon. 03/04/14  Yes Elgergawy, Silver Huguenin, MD  pravastatin (PRAVACHOL) 40 MG tablet Take 40 mg by mouth daily.  11/10/15  Yes [provider]  tiotropium (SPIRIVA) 18 MCG inhalation capsule Place into inhaler and inhale daily. 11/05/11  Yes [provider]  clotrimazole-betamethasone (LOTRISONE) cream Apply to affected area 2 times daily prn Patient taking differently: Apply 1 application topically 2 (two) times daily as needed (for fungal infections).  10/31/15   Janne Napoleon, NP  hydrOXYzine (ATARAX/VISTARIL) 25 MG tablet Take 1 tablet (25 mg total) by mouth every 6 (six) hours. 08/09/16   Lysbeth Penner, FNP  mupirocin ointment (BACTROBAN) 2 % Apply to affected area two times a day 04/11/17   [provider]  predniSONE (DELTASONE) 20 MG tablet Take 3 tablets (60 mg total) by mouth daily before breakfast. Patient not taking: Reported on 06/01/2017 05/05/17   Nita Sells, MD    Family History Family History  Problem Relation Age of Onset  . Heart disease Mother   . Stroke Mother   . Stroke Father   . Cancer Sister        GIST  . Cancer Brother        thyroid   . Diabetes Son   . Stroke Son     Social History Social History   Tobacco Use  . Smoking status: Current Some Day Smoker    Packs/day: 0.25    Years: 70.00    Pack years: 17.50    Types: Cigarettes    Start date: 03/08/1947  . Smokeless tobacco: Never Used  Substance Use Topics  . Alcohol use: No    Alcohol/week: 0.0 oz  . Drug use: No     Allergies   Bactrim [sulfamethoxazole-trimethoprim]; Crestor [rosuvastatin calcium]; Pravastatin; and Penicillins   Review of Systems Review of Systems  Constitutional: Positive for chills and fever.  All other systems reviewed and are negative.    Physical  Exam Updated Vital Signs BP (!) 155/57   Pulse 85   Temp (!) 102.6 F (39.2 C) (Oral)   Resp (!) 22   SpO2 99%   Physical Exam  Constitutional: She is oriented to person, place, and time. She appears well-developed.  HENT:  Head: Normocephalic.  Mouth/Throat: Oropharynx is clear and moist.  Eyes: Pupils are equal, round, and reactive to light. Conjunctivae and EOM are normal.  Neck: Normal range of motion. Neck supple.  Cardiovascular: Normal rate, regular rhythm and normal heart sounds.  Pulmonary/Chest: Effort normal and breath sounds normal. No stridor. No respiratory distress. She has no wheezes.  Abdominal: Soft. Bowel sounds are normal.  Mild R CVAT   Musculoskeletal: Normal range of motion.  Mild erythema bilateral lower legs, no obvious cellulitis, 1+ edema bilaterally   Neurological: She is alert and oriented to person, place, and time.  Skin: Skin is warm. No  erythema.  Psychiatric: She has a normal mood and affect.  Nursing note and vitals reviewed.    ED Treatments / Results  Labs (all labs ordered are listed, but only abnormal results are displayed) Labs Reviewed  CBC WITH DIFFERENTIAL/PLATELET - Abnormal; Notable for the following components:      Result Value   Hemoglobin 10.5 (*)    HCT 32.9 (*)    MCV 75.6 (*)    MCH 24.1 (*)    RDW 17.2 (*)    Neutro Abs 8.0 (*)    Lymphs Abs 0.6 (*)    All other components within normal limits  COMPREHENSIVE METABOLIC PANEL - Abnormal; Notable for the following components:   GFR calc non Af Amer 57 (*)    All other components within normal limits  URINALYSIS, ROUTINE W REFLEX MICROSCOPIC - Abnormal; Notable for the following components:   Hgb urine dipstick MODERATE (*)    Ketones, ur 5 (*)    Leukocytes, UA SMALL (*)    Bacteria, UA RARE (*)    Squamous Epithelial / LPF 0-5 (*)    All other components within normal limits  LACTIC ACID, PLASMA - Abnormal; Notable for the following components:   Lactic Acid,  Venous 2.9 (*)    All other components within normal limits  CULTURE, BLOOD (ROUTINE X 2)  CULTURE, BLOOD (ROUTINE X 2)  URINE CULTURE  LACTIC ACID, PLASMA  INFLUENZA PANEL BY PCR (TYPE A & B)  I-STAT CG4 LACTIC ACID, ED    EKG None  Radiology Dg Chest 2 View  Result Date: 06/01/2017 CLINICAL DATA:  84 year old female with fever. Episode of shaking uncontrollably today around 1400 hours. EXAM: CHEST - 2 VIEW COMPARISON:  Chest CTA 09/07/2016 and earlier. FINDINGS: Upright AP and lateral views of the chest. Stable lung volumes. Stable mild cardiomegaly. Other mediastinal contours are within normal limits. Visualized tracheal air column is within normal limits. No pneumothorax, pulmonary edema, pleural effusion or confluent pulmonary opacity. No acute osseous abnormality identified. Abdomen Calcified aortic atherosclerosis. Negative visible bowel gas pattern. IMPRESSION: 1. No acute cardiopulmonary abnormality. 2.  Aortic Atherosclerosis (ICD10-I70.0). Electronically Signed   By: Genevie Ann M.D.   On: 06/01/2017 20:27   Ct Abdomen Pelvis W Contrast  Result Date: 06/01/2017 CLINICAL DATA:  84 year old female with fever, episode of shaking. Abdominal pain. EXAM: CT ABDOMEN AND PELVIS WITH CONTRAST TECHNIQUE: Multidetector CT imaging of the abdomen and pelvis was performed using the standard protocol following bolus administration of intravenous contrast. CONTRAST:  151mL ISOVUE-300 IOPAMIDOL (ISOVUE-300) INJECTION 61% COMPARISON:  Chest CTA 09/07/2016.  CT Pelvis 11/22/2009 FINDINGS: Lower chest: Stable lung bases since 2018. No cardiomegaly or pericardial effusion. Calcified aortic atherosclerosis. No pleural effusion. Minor lung base atelectasis and/or scarring. Hepatobiliary: Negative liver and gallbladder. Pancreas: Punctate parenchymal calcifications scattered throughout the entire pancreas. No significant pancreatic atrophy. No ductal dilatation. No inflammation. Spleen: Negative. Adrenals/Urinary  Tract: Normal adrenal glands. Bilateral renal enhancement and contrast excretion is symmetric and within normal limits. There are small benign bilateral renal parapelvic cysts. Occasional small benign renal cortical cyst. Unremarkable course of both ureters; the distal right ureter is obscured by streak artifact in the pelvis. Some of the urinary bladder is obscured by streak artifact. The visible bladder is unremarkable. Stomach/Bowel: Negative rectum and sigmoid colon aside from redundancy and some retained stool. Decompressed and negative left colon. Mildly redundant but otherwise negative transverse colon. Mild retained stool in the right colon. Normal terminal ileum. Diminutive or absent appendix.  No pericecal inflammation. No dilated or abnormal small bowel loops are identified. The stomach is decompressed. The duodenum is unremarkable. No abdominal free air or free fluid. Vascular/Lymphatic: Aortoiliac calcified atherosclerosis. Major arterial structures in the abdomen and pelvis remain patent. There is up to moderate stenosis of the distal abdominal aorta (series 3, image 36). The portal venous system appears patent. No lymphadenopathy. Reproductive: Within normal limits. Other: Streak artifact through the pelvis from the right hip hardware. No pelvic free fluid is evident. Musculoskeletal: Chronic degenerative changes in the spine. Previous right hip arthroplasty. No acute osseous abnormality identified. IMPRESSION: 1. No acute or inflammatory process identified in the Abdomen or pelvis. 2. Aortic Atherosclerosis (ICD10-I70.0) with up to moderate stenosis of the distal abdominal aorta suspected. 3. Chronic calcific pancreatitis.  No active inflammation. Electronically Signed   By: Genevie Ann M.D.   On: 06/01/2017 21:53    Procedures Procedures (including critical care time)  Medications Ordered in ED Medications  acetaminophen (TYLENOL) tablet 650 mg (650 mg Oral Given 06/01/17 2155)  sodium chloride  0.9 % bolus 1,000 mL (1,000 mLs Intravenous New Bag/Given 06/01/17 2155)  iopamidol (ISOVUE-300) 61 % injection (100 mLs  Contrast Given 06/01/17 2131)     Initial Impression / Assessment and Plan / ED Course  I have reviewed the triage vital signs and the nursing notes.  Pertinent labs & imaging results that were available during my care of the patient were reviewed by me and considered in my medical decision making (see chart for details).  Clinical Course as of Jun 02 2315  Thu Jun 01, 2017  1603 POCT Pregnancy, Urine [HN]    Clinical Course User Index [HN] Ashley Murrain, NP    Leah Wiggins is a 84 y.o. female here with chills. Febrile 102 in the ED. Consider pyelonephritis vs infected kidney stone vs pneumonia vs influenza. Will do sepsis workup with CBC, CMP, CT ab/pel, influenza, UA, CXR.   11:21 PM Lactate was 2.9 initially, down to 1.6 after NS 1 L bolus. UA and CXR and CBC stable. CT ab/pel unremarkable. Suspect flu like illness. Will dc home with tamiflu. Recommend tylenol, motrin for fever.   Final Clinical Impressions(s) / ED Diagnoses   Final diagnoses:  None    ED Discharge Orders    None       Drenda Freeze, MD 06/01/17 2322

## 2017-06-03 LAB — URINE CULTURE: Culture: NO GROWTH

## 2017-06-06 LAB — CULTURE, BLOOD (ROUTINE X 2)
Culture: NO GROWTH
Culture: NO GROWTH
Special Requests: ADEQUATE
Special Requests: ADEQUATE

## 2017-06-08 DIAGNOSIS — H903 Sensorineural hearing loss, bilateral: Secondary | ICD-10-CM | POA: Diagnosis not present

## 2017-06-08 DIAGNOSIS — E119 Type 2 diabetes mellitus without complications: Secondary | ICD-10-CM | POA: Diagnosis not present

## 2017-06-08 DIAGNOSIS — H61893 Other specified disorders of external ear, bilateral: Secondary | ICD-10-CM | POA: Diagnosis not present

## 2017-06-08 DIAGNOSIS — H608X3 Other otitis externa, bilateral: Secondary | ICD-10-CM | POA: Diagnosis not present

## 2017-06-08 DIAGNOSIS — H6011 Cellulitis of right external ear: Secondary | ICD-10-CM | POA: Diagnosis not present

## 2017-06-10 ENCOUNTER — Other Ambulatory Visit: Payer: Self-pay

## 2017-06-10 ENCOUNTER — Encounter (HOSPITAL_COMMUNITY): Payer: Self-pay | Admitting: Emergency Medicine

## 2017-06-10 ENCOUNTER — Ambulatory Visit (HOSPITAL_COMMUNITY)
Admission: EM | Admit: 2017-06-10 | Discharge: 2017-06-10 | Disposition: A | Discharge status: Home / Self Care | Payer: Medicare Other | Attending: Internal Medicine | Admitting: Internal Medicine

## 2017-06-10 DIAGNOSIS — R220 Localized swelling, mass and lump, head: Secondary | ICD-10-CM | POA: Diagnosis not present

## 2017-06-10 MED ORDER — FAMOTIDINE 20 MG PO TABS
ORAL_TABLET | ORAL | Status: AC
  Filled 2017-06-10: qty 1

## 2017-06-10 MED ORDER — DIPHENHYDRAMINE HCL 25 MG PO TABS: 25.0000 mg | ORAL_TABLET | Freq: Four times a day (QID) | ORAL | 0 refills | Status: DC

## 2017-06-10 MED ORDER — FAMOTIDINE 20 MG PO TABS: 20.0000 mg | ORAL_TABLET | Freq: Two times a day (BID) | ORAL | 0 refills | Status: DC

## 2017-06-10 MED ORDER — METHYLPREDNISOLONE SODIUM SUCC 125 MG IJ SOLR
125.0000 mg | Freq: Once | INTRAMUSCULAR | Status: AC
  Administered 2017-06-10: 125 mg via INTRAMUSCULAR

## 2017-06-10 MED ORDER — DIPHENHYDRAMINE HCL 25 MG PO CAPS
25.0000 mg | ORAL_CAPSULE | Freq: Once | ORAL | Status: AC
  Administered 2017-06-10: 25 mg via ORAL

## 2017-06-10 MED ORDER — METHYLPREDNISOLONE SODIUM SUCC 125 MG IJ SOLR
INTRAMUSCULAR | Status: AC
  Filled 2017-06-10: qty 2

## 2017-06-10 MED ORDER — FAMOTIDINE 20 MG PO TABS
20.0000 mg | ORAL_TABLET | Freq: Once | ORAL | Status: AC
  Administered 2017-06-10: 20 mg via ORAL

## 2017-06-10 MED ORDER — DIPHENHYDRAMINE HCL 25 MG PO CAPS
ORAL_CAPSULE | ORAL | Status: AC
  Filled 2017-06-10: qty 1

## 2017-06-10 MED ORDER — PREDNISONE 10 MG (21) PO TBPK
ORAL_TABLET | Freq: Every day | ORAL | 0 refills | Status: DC
Start: 2017-06-10 — End: 2017-12-12

## 2017-06-10 NOTE — ED Triage Notes (Signed)
Patient says she was in ed overnight for tongue swelling.    Patient say her ear provider this week and received a cream for fungus.  Today, lip swelling, eyes swelling and itching all over her body.  Patient states tongue is still thick, but not worsening.

## 2017-06-10 NOTE — ED Provider Notes (Signed)
[place]    CSN: [id number] Arrival date & time: 06/10/17  1202     History   Chief Complaint Chief Complaint  Patient presents with  . Facial Swelling    HPI Leah Wiggins is a 84 y.o. female history of hypertension, hyperlipidemia, peripheral vascular disease and type 2 diabetes presenting today with facial swelling.  She initially had swelling on 2/27 after taking Bactrim.  It caused her tongue to swell up.  She went to the emergency room and was admitted briefly.  She was given IV steroids and Pepcid and Benadryl.  She was sent home as she was breathing well and tolerating oral intake.  Since she has had continued swelling specifically of her face and ears.  She does still feel like her tongue is still thick but it has not worsened.  She is still eating and drinking okay.  Does endorse some mild difficulty breathing but relates this to is still smoking.  She denies any other new medicines in the meantime since discharge.  HPI  Past Medical History:  Diagnosis Date  . Arthritis    "shoulders" (05/04/2017)  . Breast cancer, right breast ([place]) 1991  . GERD (gastroesophageal reflux disease)   . Hyperlipidemia   . Hypertension   . Non-stress test nonreactive, 04/24/12, normal 12/10/2012  . OSA on CPAP    "don't wear it all the time" (05/04/2017)  . PVD (peripheral vascular disease) ([place])   . Tobacco use 12/10/2012  . Type II diabetes mellitus [facility])     Patient Active Problem List   Diagnosis Date Noted  . Anaphylaxis 05/04/2017  . Rash and nonspecific skin eruption 05/04/2017  . Chronic pancreatitis ([place]) 09/08/2016  . History of breast cancer 08/14/2014  . Empty sella ([place]) 07/14/2014  . Pain in the chest 04/28/2014  . Diabetes mellitus without complication ([place]) 76/19/5093  . Chest pain 03/03/2014  . Essential hypertension 06/24/2013  . PAD (peripheral artery disease), abnormal ABIs 12/10/2012  . Non-stress test nonreactive, 04/24/12, normal 12/10/2012    . Dyslipidemia 12/10/2012  . Tobacco use 12/10/2012  . Thrombocytopenia ([place]) 01/24/2012  . RESTLESS LEG SYNDROME 02/16/2010  . METHICILLIN SUSECPTIBLE PNEUMONIA STAPH AUREUS 01/13/2010  . SEPTIC ARTHRITIS 01/13/2010  . HIP THI LEG&ANK ABRASION/FRICION BURN W/O INF 01/13/2010    Past Surgical History:  Procedure Laterality Date  . APPENDECTOMY    . BREAST BIOPSY Right 1991  . CARDIOVASCULAR STRESS TEST  04/24/2012   No significant ST segment change suggestive of ischemia.  Marland Kitchen CATARACT EXTRACTION, BILATERAL Bilateral   . JOINT REPLACEMENT    . LOWER EXTREMITY ARTERIAL DOPPLER Bilateral 05/07/2012   Left ABI-demonstrated mild arterial insufficiency. Right CIA 50-69% diameter reduction. Right SFA less than 50% diameter reduction. Left SFA 70-99% diameter reduction. Bilateral Runoff-Posterior tibial arteries appeared occluded.  Marland Kitchen MASTECTOMY PARTIAL / LUMPECTOMY W/ AXILLARY LYMPHADENECTOMY Right 1991   . TOTAL HIP ARTHROPLASTY Right 2011    OB History   None      Home Medications    Prior to Admission medications   Medication Sig Start Date End Date Taking? Authorizing Provider  alendronate (FOSAMAX) 70 MG tablet Take 70 mg by mouth every Wednesday. Take with a full glass of water on an empty stomach    [provider]  aspirin EC 81 MG tablet Take 81 mg by mouth daily.    [provider]  clotrimazole-betamethasone (LOTRISONE) cream Apply to affected area 2 times daily prn Patient taking differently: Apply 1 application topically  2 (two) times daily as needed (for fungal infections).  10/31/15   Janne Napoleon, NP  diphenhydrAMINE (BENADRYL) 25 MG tablet Take 1 tablet (25 mg total) by mouth every 6 (six) hours for 7 days. 06/10/17 06/17/17  Wieters, Hallie C, PA-C  EPINEPHrine 0.3 mg/0.3 mL IJ SOAJ injection Inject 0.3 mLs (0.3 mg total) into the muscle once as needed (prn worsening angioedema). Patient taking differently: Inject 0.3 mg into the muscle once as needed (for  worsening angioedema).  05/05/17   Nita Sells, MD  famotidine (PEPCID) 20 MG tablet Take 1 tablet (20 mg total) by mouth 2 (two) times daily. 06/10/17   Wieters, Hallie C, PA-C  ferrous sulfate 325 (65 FE) MG tablet Take 325 mg by mouth daily with breakfast.    [provider]  hydrOXYzine (ATARAX/VISTARIL) 25 MG tablet Take 1 tablet (25 mg total) by mouth every 6 (six) hours. Patient taking differently: Take 25 mg by mouth every 6 (six) hours as needed for itching.  08/09/16   Lysbeth Penner, FNP  metFORMIN (GLUCOPHAGE-XR) 500 MG 24 hr tablet Take 500 mg by mouth 2 (two) times daily.    [provider]  MICARDIS 40 MG tablet Take 40 mg by mouth daily. 04/17/17   [provider]  mometasone (ELOCON) 0.1 % lotion Apply 1 application topically daily as needed (IRRITATION).  04/15/14   [provider]  mupirocin ointment (BACTROBAN) 2 % Apply to affected area two times a day 04/11/17   [provider]  pantoprazole (PROTONIX) 40 MG tablet Take 1 tablet (40 mg total) by mouth daily at 12 noon. 03/04/14   Elgergawy, Silver Huguenin, MD  pravastatin (PRAVACHOL) 40 MG tablet Take 40 mg by mouth daily.  11/10/15   [provider]  predniSONE (STERAPRED UNI-PAK 21 TAB) 10 MG (21) TBPK tablet Take by mouth daily. Take 6 tabs by mouth daily  for 2 days, then 5 tabs for 2 days, then 4 tabs for 2 days, then 3 tabs for 2 days, 2 tabs for 2 days, then 1 tab by mouth daily for 2 days 06/10/17   Wieters, Hallie C, PA-C  tiotropium (SPIRIVA) 18 MCG inhalation capsule Place 18 mcg into inhaler and inhale daily.  11/05/11   [provider]    Family History Family History  Problem Relation Age of Onset  . Heart disease Mother   . Stroke Mother   . Stroke Father   . Cancer Sister        GIST  . Cancer Brother        thyroid   . Diabetes Son   . Stroke Son     Social History Social History   Tobacco Use  . Smoking status: Current Some Day Smoker     Packs/day: 0.25    Years: 70.00    Pack years: 17.50    Types: Cigarettes    Start date: 03/08/1947  . Smokeless tobacco: Never Used  Substance Use Topics  . Alcohol use: No    Alcohol/week: 0.0 oz  . Drug use: No     Allergies   Bactrim [sulfamethoxazole-trimethoprim]; Crestor [rosuvastatin calcium]; Pravastatin; and Penicillins   Review of Systems Review of Systems  Constitutional: Negative for fatigue and fever.  HENT: Positive for ear discharge and facial swelling. Negative for congestion, ear pain, sore throat, tinnitus, trouble swallowing and voice change.   Eyes: Negative for visual disturbance.  Respiratory: Negative for cough and shortness of breath.   Cardiovascular: Negative for  chest pain.  Gastrointestinal: Negative for abdominal pain.  Musculoskeletal: Negative for myalgias.  Skin: Positive for color change. Negative for rash.  Neurological: Negative for dizziness, speech difficulty, weakness, light-headedness, numbness and headaches.     Physical Exam Triage Vital Signs ED Triage Vitals  Enc Vitals Group     BP 06/10/17 1229 (!) 134/54     Pulse Rate 06/10/17 1229 75     Resp 06/10/17 1229 20     Temp 06/10/17 1229 97.7 F (36.5 C)     Temp Source 06/10/17 1229 Oral     SpO2 06/10/17 1229 100 %     Weight --      Height --      Head Circumference --      Peak Flow --      Pain Score 06/10/17 1226 0     Pain Loc --      Pain Edu? --      Excl. in [place]? --    No data found.  Updated Vital Signs BP (!) 134/54 (BP Location: Left Arm)   Pulse 75   Temp 97.7 F (36.5 C) (Oral)   Resp 20   SpO2 100%   Visual Acuity Right Eye Distance:   Left Eye Distance:   Bilateral Distance:    Right Eye Near:   Left Eye Near:    Bilateral Near:     Physical Exam  Constitutional: She appears well-developed and well-nourished. No distress.  HENT:  Head: Normocephalic and atraumatic.  Patient with significant swelling to bilateral auricles of years, and  entrance of external auditory canal.  Swelling with mild erythema around bilateral eyes and cheeks.  No overlying rash.  Oral mucosa without rash, tongue does not appear swollen, posterior oropharynx clear without uvula swelling.  Airway appears patent.  Eyes: Conjunctivae are normal.  Neck: Neck supple.  Cardiovascular: Normal rate and regular rhythm.  No murmur heard. Pulmonary/Chest: Effort normal and breath sounds normal. No respiratory distress.  Breathing comfortably at rest, CTA BL  Musculoskeletal: She exhibits no edema.  Neurological: She is alert.  Skin: Skin is warm and dry.  Lower extremities with chronic vascular changes and discoloration, mild swelling on left lower extremity  Psychiatric: She has a normal mood and affect.  Nursing note and vitals reviewed.    UC Treatments / Results  Labs (all labs ordered are listed, but only abnormal results are displayed) Labs Reviewed - No data to display  EKG None Radiology No results found.  Procedures Procedures (including critical care time)  Medications Ordered in UC Medications  methylPREDNISolone sodium succinate (SOLU-MEDROL) 125 mg/2 mL injection 125 mg (125 mg Intramuscular Given 06/10/17 1304)  famotidine (PEPCID) tablet 20 mg (20 mg Oral Given 06/10/17 1304)  diphenhydrAMINE (BENADRYL) capsule 25 mg (25 mg Oral Given 06/10/17 1303)     Initial Impression / Assessment and Plan / UC Course  I have reviewed the triage vital signs and the nursing notes.  Pertinent labs & imaging results that were available during my care of the patient were reviewed by me and considered in my medical decision making (see chart for details).     Patient with persistent swelling since taking Bactrim 1 month ago, symptoms have not worsened.  Breathing is stable and airway is patent.  Will give Solu-Medrol today, sent home with prednisone taper.  Pepcid and Benadryl as well.  Follow-up with PCP and allergy for further treatment and  evaluation.  Discussed signs and symptoms to go to  emergency room, Discussed strict return precautions. Patient verbalized understanding and is agreeable with plan.   Final Clinical Impressions(s) / UC Diagnoses   Final diagnoses:  Facial swelling    ED Discharge Orders        Ordered    predniSONE (STERAPRED UNI-PAK 21 TAB) 10 MG (21) TBPK tablet  Daily     06/10/17 1259    famotidine (PEPCID) 20 MG tablet  2 times daily     06/10/17 1259    diphenhydrAMINE (BENADRYL) 25 MG tablet  Every 6 hours     06/10/17 1259       Controlled Substance Prescriptions [place] Controlled Substance Registry consulted? Not Applicable   Janith Lima, [place] 06/10/17 1313

## 2017-06-10 NOTE — Discharge Instructions (Signed)
Please begin prednisone taper pack  Use benadryl and pepcid to help with redness and itching  Please follow up with your primary doctor and/or allergy for further evaluation and treatment  If you have worsening swelling, difficulty breathing, change in speech, swelling in throat please go to emergency room immediately.

## 2017-06-15 DIAGNOSIS — Z09 Encounter for follow-up examination after completed treatment for conditions other than malignant neoplasm: Secondary | ICD-10-CM | POA: Diagnosis not present

## 2017-06-15 DIAGNOSIS — K219 Gastro-esophageal reflux disease without esophagitis: Secondary | ICD-10-CM | POA: Diagnosis not present

## 2017-06-15 DIAGNOSIS — K921 Melena: Secondary | ICD-10-CM | POA: Diagnosis not present

## 2017-06-26 DIAGNOSIS — B351 Tinea unguium: Secondary | ICD-10-CM | POA: Diagnosis not present

## 2017-06-26 DIAGNOSIS — M79672 Pain in left foot: Secondary | ICD-10-CM | POA: Diagnosis not present

## 2017-06-26 DIAGNOSIS — M79671 Pain in right foot: Secondary | ICD-10-CM | POA: Diagnosis not present

## 2017-06-26 DIAGNOSIS — E1151 Type 2 diabetes mellitus with diabetic peripheral angiopathy without gangrene: Secondary | ICD-10-CM | POA: Diagnosis not present

## 2017-06-26 DIAGNOSIS — I739 Peripheral vascular disease, unspecified: Secondary | ICD-10-CM | POA: Diagnosis not present

## 2017-06-26 DIAGNOSIS — L209 Atopic dermatitis, unspecified: Secondary | ICD-10-CM | POA: Diagnosis not present

## 2017-06-29 DIAGNOSIS — H608X3 Other otitis externa, bilateral: Secondary | ICD-10-CM | POA: Diagnosis not present

## 2017-06-29 DIAGNOSIS — E119 Type 2 diabetes mellitus without complications: Secondary | ICD-10-CM | POA: Diagnosis not present

## 2017-06-29 DIAGNOSIS — H61893 Other specified disorders of external ear, bilateral: Secondary | ICD-10-CM | POA: Diagnosis not present

## 2017-06-29 DIAGNOSIS — H903 Sensorineural hearing loss, bilateral: Secondary | ICD-10-CM | POA: Diagnosis not present

## 2017-07-06 DIAGNOSIS — R634 Abnormal weight loss: Secondary | ICD-10-CM | POA: Diagnosis not present

## 2017-07-06 DIAGNOSIS — D509 Iron deficiency anemia, unspecified: Secondary | ICD-10-CM | POA: Diagnosis not present

## 2017-07-06 DIAGNOSIS — K5520 Angiodysplasia of colon without hemorrhage: Secondary | ICD-10-CM | POA: Diagnosis not present

## 2017-07-26 DIAGNOSIS — N183 Chronic kidney disease, stage 3 (moderate): Secondary | ICD-10-CM | POA: Diagnosis not present

## 2017-07-26 DIAGNOSIS — R21 Rash and other nonspecific skin eruption: Secondary | ICD-10-CM | POA: Diagnosis not present

## 2017-07-26 DIAGNOSIS — I1310 Hypertensive heart and chronic kidney disease without heart failure, with stage 1 through stage 4 chronic kidney disease, or unspecified chronic kidney disease: Secondary | ICD-10-CM | POA: Diagnosis not present

## 2017-07-26 DIAGNOSIS — E1122 Type 2 diabetes mellitus with diabetic chronic kidney disease: Secondary | ICD-10-CM | POA: Diagnosis not present

## 2017-08-08 ENCOUNTER — Encounter: Payer: Self-pay | Admitting: Cardiovascular Disease

## 2017-08-08 ENCOUNTER — Ambulatory Visit (INDEPENDENT_AMBULATORY_CARE_PROVIDER_SITE_OTHER): Payer: Medicare Other | Admitting: Cardiovascular Disease

## 2017-08-08 VITALS — BP 110/52 | HR 75 | Ht 63.0 in | Wt 160.0 lb

## 2017-08-08 DIAGNOSIS — I10 Essential (primary) hypertension: Secondary | ICD-10-CM

## 2017-08-08 DIAGNOSIS — E785 Hyperlipidemia, unspecified: Secondary | ICD-10-CM | POA: Diagnosis not present

## 2017-08-08 DIAGNOSIS — I739 Peripheral vascular disease, unspecified: Secondary | ICD-10-CM

## 2017-08-08 NOTE — Progress Notes (Signed)
08/08/2017 [name]   03/08/1926  [id number]  Primary Physician Glendale Chard, MD Primary Cardiologist: Lorretta Harp MD FACP, [place], [place], [place]  HPI:  Leah Wiggins is a 84 y.o.  mildly overweight widowed [place] American female, mother of 33, grandmother of greater than 38 grandchildren. She is accompanied by her daughter [name] today. I last saw her in the office 06/28/2016.Marland Kitchen Her risk factors include recently discontinued tobacco abuse having smoked for many years and currently wearing a nicotine patch., contrary, hypertension, mild hyperlipidemia, and non-insulin-requiring diabetes. She also has peripheral vascular occlusive disease with Dopplers that show a right ABI of .64 with a high-frequency signal in the origin of the right common iliac artery I saw this progression of disease in the distal right SFA., left ABI of 0.84 with a high-frequency signal in the origin of the left SFA, though she really denies claudication. She had a negative Myoview performed 04/24/12 and more recently 04/29/14 for atypical chest pain. Since I saw her in the office a year ago she has remained asymptomatic facility denying chest pain or shortness of breath.     Current Meds  Medication Sig  . alendronate (FOSAMAX) 70 MG tablet Take 70 mg by mouth every Wednesday. Take with a full glass of water on an empty stomach  . aspirin EC 81 MG tablet Take 81 mg by mouth daily.  . clotrimazole-betamethasone (LOTRISONE) cream Apply to affected area 2 times daily prn (Patient taking differently: Apply 1 application topically 2 (two) times daily as needed (for fungal infections). )  . diphenhydrAMINE (BENADRYL) 25 MG tablet Take 1 tablet (25 mg total) by mouth every 6 (six) hours for 7 days.  Marland Kitchen EPINEPHrine 0.3 mg/0.3 mL IJ SOAJ injection Inject 0.3 mLs (0.3 mg total) into the muscle once as needed (prn worsening angioedema). (Patient taking differently: Inject 0.3 mg into the muscle once as needed (for  worsening angioedema). )  . famotidine (PEPCID) 20 MG tablet Take 1 tablet (20 mg total) by mouth 2 (two) times daily.  . ferrous sulfate 325 (65 FE) MG tablet Take 325 mg by mouth daily with breakfast.  . hydrOXYzine (ATARAX/VISTARIL) 25 MG tablet Take 1 tablet (25 mg total) by mouth every 6 (six) hours. (Patient taking differently: Take 25 mg by mouth every 6 (six) hours as needed for itching. )  . metFORMIN (GLUCOPHAGE-XR) 500 MG 24 hr tablet Take 500 mg by mouth 2 (two) times daily.  Marland Kitchen MICARDIS 40 MG tablet Take 40 mg by mouth daily.  . mometasone (ELOCON) 0.1 % lotion Apply 1 application topically daily as needed (IRRITATION).   Marland Kitchen mupirocin ointment (BACTROBAN) 2 % Apply to affected area two times a day  . pantoprazole (PROTONIX) 40 MG tablet Take 1 tablet (40 mg total) by mouth daily at 12 noon.  . pravastatin (PRAVACHOL) 40 MG tablet Take 40 mg by mouth daily.   . predniSONE (STERAPRED UNI-PAK 21 TAB) 10 MG (21) TBPK tablet Take by mouth daily. Take 6 tabs by mouth daily  for 2 days, then 5 tabs for 2 days, then 4 tabs for 2 days, then 3 tabs for 2 days, 2 tabs for 2 days, then 1 tab by mouth daily for 2 days  . tiotropium (SPIRIVA) 18 MCG inhalation capsule Place 18 mcg into inhaler and inhale daily.      Allergies  Allergen Reactions  . Bactrim [Sulfamethoxazole-Trimethoprim] Swelling    Marked angioedema requiring hospitalization  . Crestor [Rosuvastatin Calcium] Other (  See Comments)    Muscle soreness  . Pravastatin Other (See Comments)    Muscle soreness and weakness (Patient takes this, however)  . Penicillins Other (See Comments)    Has patient had a PCN reaction causing immediate rash, facial/tongue/throat swelling, SOB or lightheadedness with hypotension: Unk Has patient had a PCN reaction causing severe rash involving mucus membranes or skin necrosis: Unk Has patient had a PCN reaction that required hospitalization: Unk Has patient had a PCN reaction occurring within the  last 10 years: No If all of the above answers are "NO", then may proceed with Cephalosporin use.    Social History   Socioeconomic History  . Marital status: Widowed    Spouse name: Not on file  . Number of children: Not on file  . Years of education: Not on file  . Highest education level: Not on file  Occupational History  . Not on file  Social Needs  . Financial resource strain: Not on file  . Food insecurity:    Worry: Not on file    Inability: Not on file  . Transportation needs:    Medical: Not on file    Non-medical: Not on file  Tobacco Use  . Smoking status: Current Some Day Smoker    Packs/day: 0.25    Years: 70.00    Pack years: 17.50    Types: Cigarettes    Start date: 03/08/1947  . Smokeless tobacco: Never Used  Substance and Sexual Activity  . Alcohol use: No    Alcohol/week: 0.0 oz  . Drug use: No  . Sexual activity: Never  Lifestyle  . Physical activity:    Days per week: Not on file    Minutes per session: Not on file  . Stress: Not on file  Relationships  . Social connections:    Talks on phone: Not on file    Gets together: Not on file    Attends religious service: Not on file    Active member of club or organization: Not on file    Attends meetings of clubs or organizations: Not on file    Relationship status: Not on file  . Intimate partner violence:    Fear of current or ex partner: Not on file    Emotionally abused: Not on file    Physically abused: Not on file    Forced sexual activity: Not on file  Other Topics Concern  . Not on file  Social History Narrative  . Not on file     Review of Systems: General: negative for chills, fever, night sweats or weight changes.  Cardiovascular: negative for chest pain, dyspnea on exertion, edema, orthopnea, palpitations, paroxysmal nocturnal dyspnea or shortness of breath Dermatological: negative for rash Respiratory: negative for cough or wheezing Urologic: negative for hematuria Abdominal:  negative for nausea, vomiting, diarrhea, bright red blood per rectum, melena, or hematemesis Neurologic: negative for visual changes, syncope, or dizziness All other systems reviewed and are otherwise negative except as noted above.    Blood pressure (!) 110/52, pulse 75, height 5\' 3"  (1.6 m), weight 160 lb (72.6 kg).  General appearance: alert and no distress Neck: no adenopathy, no carotid bruit, no JVD, supple, symmetrical, trachea midline and thyroid not enlarged, symmetric, no tenderness/mass/nodules Lungs: clear to auscultation bilaterally Heart: regular rate and rhythm, S1, S2 normal, no murmur, click, rub or gallop Extremities: extremities normal, atraumatic, no cyanosis or edema Pulses: Diminished pedal pulses bilaterally Skin: Skin color, texture, turgor normal. No rashes or  lesions Neurologic: Alert and oriented X 3, normal strength and tone. Normal symmetric reflexes. Normal coordination and gait  EKG sinus rhythm at 75 without ST or T wave changes.  I personally reviewed this EKG  ASSESSMENT AND PLAN:   PAD (peripheral artery disease), abnormal ABIs History of peripheral arterial disease Dopplers that show a right ABI of 0.64 with a high-frequency signal at the origin of the right common iliac artery and left ABI of 0.84 with a high-frequency signal at the origin of her left SFA although she really denies claudication.  Dyslipidemia History of dyslipidemia on statin therapy with lipid profile performed 09/08/2016 revealing total cholesterol 125, LDL 65 and HDL 51.  Essential hypertension History of essential hypertension her blood pressure measured at 110/52.  She is on Micardis.  Continue current meds at current dosing.      Lorretta Harp MD FACP,FACC,FAHA, [facility] 08/08/2017 4:42 PM

## 2017-08-08 NOTE — Assessment & Plan Note (Signed)
History of peripheral arterial disease Dopplers that show a right ABI of 0.64 with a high-frequency signal at the origin of the right common iliac artery and left ABI of 0.84 with a high-frequency signal at the origin of her left SFA although she really denies claudication.

## 2017-08-08 NOTE — Patient Instructions (Signed)
Your physician recommends that you schedule a follow-up appointment as needed with Dr. Berry   

## 2017-08-08 NOTE — Assessment & Plan Note (Signed)
History of essential hypertension her blood pressure measured at 110/52.  She is on Micardis.  Continue current meds at current dosing.

## 2017-08-08 NOTE — Assessment & Plan Note (Signed)
History of dyslipidemia on statin therapy with lipid profile performed 09/08/2016 revealing total cholesterol 125, LDL 65 and HDL 51.

## 2017-08-21 DIAGNOSIS — Z7984 Long term (current) use of oral hypoglycemic drugs: Secondary | ICD-10-CM | POA: Diagnosis not present

## 2017-08-21 DIAGNOSIS — E119 Type 2 diabetes mellitus without complications: Secondary | ICD-10-CM | POA: Diagnosis not present

## 2017-08-21 DIAGNOSIS — Z961 Presence of intraocular lens: Secondary | ICD-10-CM | POA: Diagnosis not present

## 2017-09-11 DIAGNOSIS — M79672 Pain in left foot: Secondary | ICD-10-CM | POA: Diagnosis not present

## 2017-09-11 DIAGNOSIS — M79671 Pain in right foot: Secondary | ICD-10-CM | POA: Diagnosis not present

## 2017-09-11 DIAGNOSIS — B351 Tinea unguium: Secondary | ICD-10-CM | POA: Diagnosis not present

## 2017-09-11 DIAGNOSIS — E1151 Type 2 diabetes mellitus with diabetic peripheral angiopathy without gangrene: Secondary | ICD-10-CM | POA: Diagnosis not present

## 2017-09-19 DIAGNOSIS — L299 Pruritus, unspecified: Secondary | ICD-10-CM | POA: Diagnosis not present

## 2017-09-19 DIAGNOSIS — L209 Atopic dermatitis, unspecified: Secondary | ICD-10-CM | POA: Diagnosis not present

## 2017-09-19 DIAGNOSIS — L810 Postinflammatory hyperpigmentation: Secondary | ICD-10-CM | POA: Diagnosis not present

## 2017-09-19 DIAGNOSIS — B353 Tinea pedis: Secondary | ICD-10-CM | POA: Diagnosis not present

## 2017-09-19 DIAGNOSIS — L2084 Intrinsic (allergic) eczema: Secondary | ICD-10-CM | POA: Diagnosis not present

## 2017-10-30 DIAGNOSIS — I129 Hypertensive chronic kidney disease with stage 1 through stage 4 chronic kidney disease, or unspecified chronic kidney disease: Secondary | ICD-10-CM | POA: Diagnosis not present

## 2017-10-30 DIAGNOSIS — E1122 Type 2 diabetes mellitus with diabetic chronic kidney disease: Secondary | ICD-10-CM | POA: Diagnosis not present

## 2017-10-30 DIAGNOSIS — N183 Chronic kidney disease, stage 3 (moderate): Secondary | ICD-10-CM | POA: Diagnosis not present

## 2017-10-30 DIAGNOSIS — N08 Glomerular disorders in diseases classified elsewhere: Secondary | ICD-10-CM | POA: Diagnosis not present

## 2017-10-31 DIAGNOSIS — I129 Hypertensive chronic kidney disease with stage 1 through stage 4 chronic kidney disease, or unspecified chronic kidney disease: Secondary | ICD-10-CM | POA: Diagnosis not present

## 2017-10-31 DIAGNOSIS — E1122 Type 2 diabetes mellitus with diabetic chronic kidney disease: Secondary | ICD-10-CM | POA: Diagnosis not present

## 2017-10-31 DIAGNOSIS — E559 Vitamin D deficiency, unspecified: Secondary | ICD-10-CM | POA: Diagnosis not present

## 2017-10-31 DIAGNOSIS — N08 Glomerular disorders in diseases classified elsewhere: Secondary | ICD-10-CM | POA: Diagnosis not present

## 2017-10-31 DIAGNOSIS — F17200 Nicotine dependence, unspecified, uncomplicated: Secondary | ICD-10-CM | POA: Diagnosis not present

## 2017-10-31 DIAGNOSIS — R0602 Shortness of breath: Secondary | ICD-10-CM | POA: Diagnosis not present

## 2017-11-02 ENCOUNTER — Emergency Department (HOSPITAL_COMMUNITY): Payer: Medicare Other

## 2017-11-02 ENCOUNTER — Encounter (HOSPITAL_COMMUNITY): Payer: Self-pay | Admitting: *Deleted

## 2017-11-02 ENCOUNTER — Emergency Department (HOSPITAL_COMMUNITY)
Admission: EM | Admit: 2017-11-02 | Discharge: 2017-11-02 | Disposition: A | Discharge status: Home / Self Care | Payer: Medicare Other | Attending: Emergency Medicine | Admitting: Emergency Medicine

## 2017-11-02 DIAGNOSIS — R0602 Shortness of breath: Secondary | ICD-10-CM

## 2017-11-02 DIAGNOSIS — Z7982 Long term (current) use of aspirin: Secondary | ICD-10-CM | POA: Insufficient documentation

## 2017-11-02 DIAGNOSIS — E119 Type 2 diabetes mellitus without complications: Secondary | ICD-10-CM | POA: Diagnosis not present

## 2017-11-02 DIAGNOSIS — N281 Cyst of kidney, acquired: Secondary | ICD-10-CM | POA: Diagnosis not present

## 2017-11-02 DIAGNOSIS — Z79899 Other long term (current) drug therapy: Secondary | ICD-10-CM | POA: Insufficient documentation

## 2017-11-02 DIAGNOSIS — Z96641 Presence of right artificial hip joint: Secondary | ICD-10-CM | POA: Diagnosis not present

## 2017-11-02 DIAGNOSIS — Z7984 Long term (current) use of oral hypoglycemic drugs: Secondary | ICD-10-CM | POA: Insufficient documentation

## 2017-11-02 DIAGNOSIS — I10 Essential (primary) hypertension: Secondary | ICD-10-CM | POA: Diagnosis not present

## 2017-11-02 DIAGNOSIS — R1031 Right lower quadrant pain: Secondary | ICD-10-CM | POA: Insufficient documentation

## 2017-11-02 DIAGNOSIS — F17210 Nicotine dependence, cigarettes, uncomplicated: Secondary | ICD-10-CM | POA: Diagnosis not present

## 2017-11-02 DIAGNOSIS — Z853 Personal history of malignant neoplasm of breast: Secondary | ICD-10-CM | POA: Diagnosis not present

## 2017-11-02 DIAGNOSIS — R42 Dizziness and giddiness: Secondary | ICD-10-CM | POA: Diagnosis not present

## 2017-11-02 LAB — COMPREHENSIVE METABOLIC PANEL
ALT: 20 U/L (ref 0–44)
AST: 24 U/L (ref 15–41)
Albumin: 3.6 g/dL (ref 3.5–5.0)
Alkaline Phosphatase: 73 U/L (ref 38–126)
Anion gap: 10 (ref 5–15)
BUN: 9 mg/dL (ref 8–23)
CO2: 24 mmol/L (ref 22–32)
Calcium: 8.5 mg/dL — ABNORMAL LOW (ref 8.9–10.3)
Chloride: 110 mmol/L (ref 98–111)
Creatinine, Ser: 0.85 mg/dL (ref 0.44–1.00)
GFR calc Af Amer: >60 mL/min (ref >60)
GFR calc non Af Amer: 59 mL/min — ABNORMAL LOW (ref >60)
Glucose, Bld: 113 mg/dL — ABNORMAL HIGH (ref 70–99)
Potassium: 3.5 mmol/L (ref 3.5–5.1)
Sodium: 144 mmol/L (ref 135–145)
Total Bilirubin: 0.7 mg/dL (ref 0.3–1.2)
Total Protein: 6.5 g/dL (ref 6.5–8.1)

## 2017-11-02 LAB — CBC
HCT: 27.2 % — ABNORMAL LOW (ref 36.0–46.0)
Hemoglobin: 8.2 g/dL — ABNORMAL LOW (ref 12.0–15.0)
MCH: 20.4 pg — ABNORMAL LOW (ref 26.0–34.0)
MCHC: 30.1 g/dL (ref 30.0–36.0)
MCV: 67.7 fL — ABNORMAL LOW (ref 78.0–100.0)
Platelets: 130 10*3/uL — ABNORMAL LOW (ref 150–400)
RBC: 4.02 MIL/uL (ref 3.87–5.11)
RDW: 21 % — ABNORMAL HIGH (ref 11.5–15.5)
WBC: 4.5 10*3/uL (ref 4.0–10.5)

## 2017-11-02 LAB — POC OCCULT BLOOD, ED: Fecal Occult Bld: NEGATIVE

## 2017-11-02 LAB — I-STAT TROPONIN, ED
Troponin i, poc: 0 ng/mL (ref 0.00–0.08)
Troponin i, poc: 0.01 ng/mL (ref 0.00–0.08)

## 2017-11-02 LAB — SAMPLE TO BLOOD BANK

## 2017-11-02 LAB — LIPASE, BLOOD: Lipase: 31 U/L (ref 11–51)

## 2017-11-02 NOTE — ED Triage Notes (Signed)
Pt in c/o shortness of breath that is worse with exertion that started a few days ago, went to her PCP and sent here, no distress noted in triage, denies chest pain

## 2017-11-02 NOTE — ED Notes (Signed)
Patient transported to CT 

## 2017-11-02 NOTE — ED Provider Notes (Signed)
[facility] EMERGENC[facility] DEPARTMENT Provider Note   CSN: [id number] Arrival date & time: 11/02/17  1027     History   Chief Complaint Chief Complaint  Patient presents with  . Shortness of Breath  . Abdominal Pain    HPI Leah Wiggins is a 84 y.o. female.  Patient is an 84 year old female presenting with shortness of breath and abdominal pain.  Patient reports that symptoms began on and off for the past few months but worsening over the past week.  Today was worse enough that she called her daughter-in-law to inform her of symptoms.  States that at home she uses Spiriva to help with shortness of breath and this helps slightly.  Denies any chest pain.  Patient reports that shortness of breath is all the time both with exertion and rest.  Patient states that when she has these shortness of breath episodes she begins to be dizzy as well.  Patient reports that her abdominal pain is in the right lower quadrant. States abdominal pain occurs with she takes deep breaths in. Denies nausea, vomiting, diarrhea.  Denies any blood in stool or emesis.  Denies any hematuria or any other urinary symptoms.  Does report, however, that she has pain in her legs when urinating. Patient does endorse daily smoking, 5 cigarettes a day.      Past Medical History:  Diagnosis Date  . Arthritis    "shoulders" (05/04/2017)  . Breast cancer, right breast ([place]) 1991  . GERD (gastroesophageal reflux disease)   . Hyperlipidemia   . Hypertension   . Non-stress test nonreactive, 04/24/12, normal 12/10/2012  . OSA on CPAP    "don't wear it all the time" (05/04/2017)  . PVD (peripheral vascular disease) ([place])   . Tobacco use 12/10/2012  . Type II diabetes mellitus [facility])     Patient Active Problem List   Diagnosis Date Noted  . Anaphylaxis 05/04/2017  . Rash and nonspecific skin eruption 05/04/2017  . Chronic pancreatitis ([place]) 09/08/2016  . History of breast cancer 08/14/2014  . Empty sella  ([place]) 07/14/2014  . Pain in the chest 04/28/2014  . Diabetes mellitus without complication ([place]) 70/35/0093  . Chest pain 03/03/2014  . Essential hypertension 06/24/2013  . PAD (peripheral artery disease), abnormal ABIs 12/10/2012  . Non-stress test nonreactive, 04/24/12, normal 12/10/2012  . Dyslipidemia 12/10/2012  . Tobacco use 12/10/2012  . Thrombocytopenia ([place]) 01/24/2012  . RESTLESS LEG SYNDROME 02/16/2010  . METHICILLIN SUSECPTIBLE PNEUMONIA STAPH AUREUS 01/13/2010  . SEPTIC ARTHRITIS 01/13/2010  . HIP THI LEG&ANK ABRASION/FRICION BURN W/O INF 01/13/2010    Past Surgical History:  Procedure Laterality Date  . APPENDECTOMY    . BREAST BIOPSY Right 1991  . CARDIOVASCULAR STRESS TEST  04/24/2012   No significant ST segment change suggestive of ischemia.  Marland Kitchen CATARACT EXTRACTION, BILATERAL Bilateral   . JOINT REPLACEMENT    . LOWER EXTREMITY ARTERIAL DOPPLER Bilateral 05/07/2012   Left ABI-demonstrated mild arterial insufficiency. Right CIA 50-69% diameter reduction. Right SFA less than 50% diameter reduction. Left SFA 70-99% diameter reduction. Bilateral Runoff-Posterior tibial arteries appeared occluded.  Marland Kitchen MASTECTOMY PARTIAL / LUMPECTOMY W/ AXILLARY LYMPHADENECTOMY Right 1991   . TOTAL HIP ARTHROPLASTY Right 2011     OB History   None      Home Medications    Prior to Admission medications   Medication Sig Start Date End Date Taking? Authorizing Provider  alendronate (FOSAMAX) 70 MG tablet Take 70 mg by mouth every Wednesday. Take with  a full glass of water on an empty stomach    [provider]  aspirin EC 81 MG tablet Take 81 mg by mouth daily.    [provider]  clotrimazole-betamethasone (LOTRISONE) cream Apply to affected area 2 times daily prn Patient taking differently: Apply 1 application topically 2 (two) times daily as needed (for fungal infections).  10/31/15   Janne Napoleon, NP  diphenhydrAMINE (BENADRYL) 25 MG tablet Take 1 tablet (25 mg  total) by mouth every 6 (six) hours for 7 days. 06/10/17 08/08/17  Wieters, Hallie C, PA-C  EPINEPHrine 0.3 mg/0.3 mL IJ SOAJ injection Inject 0.3 mLs (0.3 mg total) into the muscle once as needed (prn worsening angioedema). Patient taking differently: Inject 0.3 mg into the muscle once as needed (for worsening angioedema).  05/05/17   Nita Sells, MD  famotidine (PEPCID) 20 MG tablet Take 1 tablet (20 mg total) by mouth 2 (two) times daily. 06/10/17   Wieters, Hallie C, PA-C  ferrous sulfate 325 (65 FE) MG tablet Take 325 mg by mouth daily with breakfast.    [provider]  hydrOXYzine (ATARAX/VISTARIL) 25 MG tablet Take 1 tablet (25 mg total) by mouth every 6 (six) hours. Patient taking differently: Take 25 mg by mouth every 6 (six) hours as needed for itching.  08/09/16   Lysbeth Penner, FNP  metFORMIN (GLUCOPHAGE-XR) 500 MG 24 hr tablet Take 500 mg by mouth 2 (two) times daily.    [provider]  MICARDIS 40 MG tablet Take 40 mg by mouth daily. 04/17/17   [provider]  mometasone (ELOCON) 0.1 % lotion Apply 1 application topically daily as needed (IRRITATION).  04/15/14   [provider]  mupirocin ointment (BACTROBAN) 2 % Apply to affected area two times a day 04/11/17   [provider]  pantoprazole (PROTONIX) 40 MG tablet Take 1 tablet (40 mg total) by mouth daily at 12 noon. 03/04/14   Elgergawy, Silver Huguenin, MD  pravastatin (PRAVACHOL) 40 MG tablet Take 40 mg by mouth daily.  11/10/15   [provider]  predniSONE (STERAPRED UNI-PAK 21 TAB) 10 MG (21) TBPK tablet Take by mouth daily. Take 6 tabs by mouth daily  for 2 days, then 5 tabs for 2 days, then 4 tabs for 2 days, then 3 tabs for 2 days, 2 tabs for 2 days, then 1 tab by mouth daily for 2 days 06/10/17   Wieters, Hallie C, PA-C  tiotropium (SPIRIVA) 18 MCG inhalation capsule Place 18 mcg into inhaler and inhale daily.  11/05/11   [provider]    Family History Family History    Problem Relation Age of Onset  . Heart disease Mother   . Stroke Mother   . Stroke Father   . Cancer Sister        GIST  . Cancer Brother        thyroid   . Diabetes Son   . Stroke Son     Social History Social History   Tobacco Use  . Smoking status: Current Some Day Smoker    Packs/day: 0.25    Years: 70.00    Pack years: 17.50    Types: Cigarettes    Start date: 03/08/1947  . Smokeless tobacco: Never Used  Substance Use Topics  . Alcohol use: No    Alcohol/week: 0.0 standard drinks  . Drug use: No     Allergies   Bactrim [sulfamethoxazole-trimethoprim]; Crestor [rosuvastatin calcium]; Pravastatin; and Penicillins   Review of  Systems Review of Systems  Respiratory: Positive for shortness of breath.   Cardiovascular: Negative for chest pain and leg swelling.  Gastrointestinal: Positive for abdominal pain. Negative for blood in stool, diarrhea, nausea and vomiting.  Genitourinary: Negative for dysuria, frequency and urgency.  Musculoskeletal: Negative for myalgias.  Neurological: Positive for dizziness. Negative for syncope and headaches.     Physical Exam Updated Vital Signs BP (!) 187/75   Pulse 64   Temp 98 F (36.7 C) (Oral)   Resp 20   SpO2 99%   Physical Exam  Constitutional: She is oriented to person, place, and time. She appears well-developed and well-nourished. No distress.  HENT:  Head: Normocephalic.  Oropharynx clear, dry mucous membranes  Eyes: Pupils are equal, round, and reactive to light. EOM are normal.  Neck: Neck supple.  Cardiovascular: Normal rate, regular rhythm, normal heart sounds and intact distal pulses. Exam reveals no friction rub.  Pulmonary/Chest: Effort normal and breath sounds normal. No respiratory distress. She has no wheezes. She has no rhonchi. She has no rales.  Abdominal: Soft. Bowel sounds are normal. There is no tenderness.  Genitourinary: Rectum normal. Rectal exam shows no tenderness and guaiac negative stool.   Musculoskeletal: Normal range of motion.       Right lower leg: Normal. She exhibits no tenderness and no edema.       Left lower leg: Normal. She exhibits no tenderness and no edema.  4/5 muscle strength in lower extremities bilaterally   Lymphadenopathy:    She has cervical adenopathy.  Neurological: She is alert and oriented to person, place, and time. No cranial nerve deficit.  Skin: Skin is warm. No rash noted.  Psychiatric: She has a normal mood and affect.     ED Treatments / Results  Labs (all labs ordered are listed, but only abnormal results are displayed) Labs Reviewed  COMPREHENSIVE METABOLIC PANEL - Abnormal; Notable for the following components:      Result Value   Glucose, Bld 113 (*)    Calcium 8.5 (*)    GFR calc non Af Amer 59 (*)    All other components within normal limits  CBC - Abnormal; Notable for the following components:   Hemoglobin 8.2 (*)    HCT 27.2 (*)    MCV 67.7 (*)    MCH 20.4 (*)    RDW 21.0 (*)    Platelets 130 (*)    All other components within normal limits  LIPASE, BLOOD  URINALYSIS, ROUTINE W REFLEX MICROSCOPIC  I-STAT TROPONIN, ED  POC OCCULT BLOOD, ED  I-STAT TROPONIN, ED  SAMPLE TO BLOOD BANK    EKG EKG Interpretation  Date/Time:  Thursday November 02 2017 10:38:54 EDT Ventricular Rate:  77 PR Interval:  164 QRS Duration: 74 QT Interval:  374 QTC Calculation: 423 R Axis:   59 Text Interpretation:  Normal sinus rhythm with sinus arrhythmia Normal ECG No significant change since last tracing Confirmed by Deno Etienne [phone]) on 11/02/2017 1:30:34 PM   Radiology Dg Chest 2 View  Result Date: 11/02/2017 CLINICAL DATA:  Shortness of breath. EXAM: CHEST - 2 VIEW COMPARISON:  Chest x-ray dated June 01, 2017. FINDINGS: Stable mild cardiomegaly. Mildly increased interstitial markings. Atherosclerotic calcification of the aortic arch. No focal consolidation, pleural effusion, or pneumothorax. No acute osseous abnormality.  IMPRESSION: 1. Mildly increased interstitial markings could be smoking-related or reflect mild interstitial edema. Electronically Signed   By: Titus Dubin M.D.   On: 11/02/2017 11:21   Ct  Renal Stone Study  Result Date: 11/02/2017 CLINICAL DATA:  Dyspnea with exertion starting a few days ago. Abdominal pain, suspect appendicitis. EXAM: CT ABDOMEN AND PELVIS WITHOUT CONTRAST TECHNIQUE: Multidetector CT imaging of the abdomen and pelvis was performed following the standard protocol without IV contrast. COMPARISON:  06/01/2017 FINDINGS: Lower chest: Normal heart size without pericardial effusion. No acute pulmonary consolidation, effusion or pneumothorax. No dominant mass. Hepatobiliary: No focal liver abnormality is seen. No gallstones, gallbladder wall thickening, or biliary dilatation. Pancreas: Scattered pancreatic calcifications consistent with stigmata of chronic pancreatitis. No acute inflammation, ductal dilatation or mass given limitations of a noncontrast study. Spleen: Normal size spleen without mass. Adrenals/Urinary Tract: Normal bilateral adrenal glands. No nephrolithiasis nor hydroureteronephrosis. Small bilateral parapelvic renal cysts are redemonstrated. The urinary bladder is unremarkable without focal mural thickening or calculi. Stomach/Bowel: Small hiatal hernia. Decompressed stomach. The duodenal sweep and ligament of Treitz are normal. No bowel obstruction or inflammation. The distal and terminal ileum are normal. What appears to represent the appendix is normal in caliber without inflammatory change. Vascular/Lymphatic: Moderate aortoiliac atherosclerosis. No lymphadenopathy by CT size criteria. Reproductive: Uterus and bilateral adnexa are unremarkable. Other: No free air nor free fluid. Small bilateral fat containing inguinal hernias. Musculoskeletal: Partially included uncemented right hip arthroplasty without complicating features. Mild degenerative disc disease of the included lower  thoracic and upper lumbar spine to L3. IMPRESSION: 1. Diminutive normal caliber to appearance of the appendix. No pericecal inflammation. No bowel obstruction or inflammatory change. 2. Stigmata of chronic pancreatitis with scattered pancreatic gland calcifications. 3. Aortoiliac atherosclerosis without aneurysm. 4. Bilateral small parapelvic renal cysts. Electronically Signed   By: Ashley Royalty M.D.   On: 11/02/2017 15:19    Procedures Procedures (including critical care time)  Medications Ordered in ED Medications - No data to display   Initial Impression / Assessment and Plan / ED Course  I have reviewed the triage vital signs and the nursing notes.  Pertinent labs & imaging results that were available during my care of the patient were reviewed by me and considered in my medical decision making (see chart for details).     Patient is an 84 year old female presenting with shortness of breath and abdominal pain.  Abdominal pain seems only be present when patient has deep breaths.  Patient is currently satting in high 90s to 100% on room air.  Patient reports his shortness of breath with both rest and exertion.  Patient is daily smoker.  Initial troponin negative and EKG with no ST changes so less likely cardiac in etiology.  Hemoglobin does appear to be 2 points lower than 5 months ago there may be a component of anemia.  Euvolemic on exam so likely secondary to fluid overload. CXR showing mildly increased interstitial markings, possibly related to smoking related changes. Lipase within normal limits.  1:48PM: rectal exam revealing external hemorrhoids but no frank blood or tenderness. Will obtain CT stone study to rule out structural abnormalities which could cause pain.   2:08PM: hemoccult negative so unlikely acute GI bleed. Possibly iron deficiency anemia.   3:25PM: Second troponin negative. CT renal stone study showing diminutive normal caliber of appendix, no bowel obstruction or  inflammatory change. Chronic pancreatitis with scattered pancreatic gland calcifications, no aortoiliac aneurysm, and small parapelvic bilateral renal cyst.   Patient stable for discharge with close follow up with PCP. Patient likely has chronic changes causing pain with no acute findings. Patient should be worked up for iron deficiency anemia by PCP.  Discussed patient with Dr. Tyrone Nine who independently examined patient and agrees with plan.   Final Clinical Impressions(s) / ED Diagnoses   Final diagnoses:  Shortness of breath  Right lower quadrant abdominal pain    ED Discharge Orders    None       Caroline More, DO 11/02/17 [place], DO 11/02/17 1557

## 2017-11-02 NOTE — ED Triage Notes (Signed)
Pt also reports lower abdominal pain new in the last few day, denies urinary symptoms or n/v

## 2017-11-02 NOTE — Discharge Instructions (Addendum)
Please follow up with your primary doctor within this week. If your shortness of breath or abdominal pain worsen or you have blood in your stool come back to the emergency room.

## 2017-11-10 DIAGNOSIS — D649 Anemia, unspecified: Secondary | ICD-10-CM

## 2017-11-10 DIAGNOSIS — Z09 Encounter for follow-up examination after completed treatment for conditions other than malignant neoplasm: Secondary | ICD-10-CM | POA: Diagnosis not present

## 2017-11-10 DIAGNOSIS — F17200 Nicotine dependence, unspecified, uncomplicated: Secondary | ICD-10-CM | POA: Diagnosis not present

## 2017-11-10 DIAGNOSIS — R0602 Shortness of breath: Secondary | ICD-10-CM | POA: Diagnosis not present

## 2017-11-10 DIAGNOSIS — N289 Disorder of kidney and ureter, unspecified: Secondary | ICD-10-CM | POA: Diagnosis not present

## 2017-11-10 DIAGNOSIS — J449 Chronic obstructive pulmonary disease, unspecified: Secondary | ICD-10-CM | POA: Diagnosis not present

## 2017-11-10 DIAGNOSIS — R1031 Right lower quadrant pain: Secondary | ICD-10-CM

## 2017-11-27 DIAGNOSIS — M79672 Pain in left foot: Secondary | ICD-10-CM | POA: Diagnosis not present

## 2017-11-27 DIAGNOSIS — E1151 Type 2 diabetes mellitus with diabetic peripheral angiopathy without gangrene: Secondary | ICD-10-CM | POA: Diagnosis not present

## 2017-11-27 DIAGNOSIS — M79671 Pain in right foot: Secondary | ICD-10-CM | POA: Diagnosis not present

## 2017-11-27 DIAGNOSIS — M25571 Pain in right ankle and joints of right foot: Secondary | ICD-10-CM | POA: Diagnosis not present

## 2017-11-27 DIAGNOSIS — I739 Peripheral vascular disease, unspecified: Secondary | ICD-10-CM | POA: Diagnosis not present

## 2017-11-27 DIAGNOSIS — B351 Tinea unguium: Secondary | ICD-10-CM | POA: Diagnosis not present

## 2017-11-30 ENCOUNTER — Other Ambulatory Visit: Payer: Self-pay | Admitting: Internal Medicine

## 2017-11-30 DIAGNOSIS — F17209 Nicotine dependence, unspecified, with unspecified nicotine-induced disorders: Secondary | ICD-10-CM

## 2017-12-01 ENCOUNTER — Telehealth: Payer: Self-pay | Admitting: Hematology and Oncology

## 2017-12-01 ENCOUNTER — Encounter: Payer: Self-pay | Admitting: Hematology and Oncology

## 2017-12-01 NOTE — Telephone Encounter (Signed)
New referral received from Dr. Baird Cancer for anemia. Pt has been scheduled to see Dr. Audelia Hives on 10/8 at 1pm. Letter mailed.

## 2017-12-06 ENCOUNTER — Ambulatory Visit: Payer: Medicare Other

## 2017-12-10 ENCOUNTER — Other Ambulatory Visit: Payer: Self-pay | Admitting: Hematology and Oncology

## 2017-12-10 DIAGNOSIS — D696 Thrombocytopenia, unspecified: Secondary | ICD-10-CM

## 2017-12-10 DIAGNOSIS — D649 Anemia, unspecified: Secondary | ICD-10-CM

## 2017-12-11 ENCOUNTER — Ambulatory Visit: Payer: Medicare Other

## 2017-12-12 ENCOUNTER — Telehealth: Payer: Self-pay

## 2017-12-12 ENCOUNTER — Encounter: Payer: Self-pay | Admitting: Hematology and Oncology

## 2017-12-12 ENCOUNTER — Inpatient Hospital Stay: Payer: Medicare Other | Attending: Hematology and Oncology | Admitting: Hematology and Oncology

## 2017-12-12 ENCOUNTER — Other Ambulatory Visit: Payer: Self-pay | Admitting: Hematology and Oncology

## 2017-12-12 ENCOUNTER — Telehealth: Payer: Self-pay | Admitting: Hematology and Oncology

## 2017-12-12 ENCOUNTER — Inpatient Hospital Stay: Payer: Medicare Other

## 2017-12-12 VITALS — BP 140/53 | HR 86 | Temp 98.3°F | Resp 18 | Ht 63.0 in | Wt 163.4 lb

## 2017-12-12 DIAGNOSIS — I129 Hypertensive chronic kidney disease with stage 1 through stage 4 chronic kidney disease, or unspecified chronic kidney disease: Secondary | ICD-10-CM | POA: Insufficient documentation

## 2017-12-12 DIAGNOSIS — E1122 Type 2 diabetes mellitus with diabetic chronic kidney disease: Secondary | ICD-10-CM | POA: Diagnosis not present

## 2017-12-12 DIAGNOSIS — R42 Dizziness and giddiness: Secondary | ICD-10-CM | POA: Diagnosis not present

## 2017-12-12 DIAGNOSIS — D649 Anemia, unspecified: Secondary | ICD-10-CM | POA: Diagnosis not present

## 2017-12-12 DIAGNOSIS — Z853 Personal history of malignant neoplasm of breast: Secondary | ICD-10-CM | POA: Diagnosis not present

## 2017-12-12 DIAGNOSIS — D696 Thrombocytopenia, unspecified: Secondary | ICD-10-CM | POA: Diagnosis not present

## 2017-12-12 DIAGNOSIS — M79672 Pain in left foot: Secondary | ICD-10-CM | POA: Diagnosis not present

## 2017-12-12 DIAGNOSIS — K219 Gastro-esophageal reflux disease without esophagitis: Secondary | ICD-10-CM | POA: Diagnosis not present

## 2017-12-12 DIAGNOSIS — M79671 Pain in right foot: Secondary | ICD-10-CM | POA: Insufficient documentation

## 2017-12-12 DIAGNOSIS — Z9011 Acquired absence of right breast and nipple: Secondary | ICD-10-CM

## 2017-12-12 DIAGNOSIS — I10 Essential (primary) hypertension: Secondary | ICD-10-CM | POA: Insufficient documentation

## 2017-12-12 DIAGNOSIS — G8929 Other chronic pain: Secondary | ICD-10-CM | POA: Diagnosis not present

## 2017-12-12 DIAGNOSIS — Z720 Tobacco use: Secondary | ICD-10-CM

## 2017-12-12 DIAGNOSIS — D500 Iron deficiency anemia secondary to blood loss (chronic): Secondary | ICD-10-CM | POA: Diagnosis not present

## 2017-12-12 DIAGNOSIS — N189 Chronic kidney disease, unspecified: Secondary | ICD-10-CM | POA: Diagnosis not present

## 2017-12-12 DIAGNOSIS — K922 Gastrointestinal hemorrhage, unspecified: Secondary | ICD-10-CM | POA: Diagnosis not present

## 2017-12-12 LAB — RETICULOCYTES
Immature Retic Fract: 22.9 % — ABNORMAL HIGH (ref 2.3–15.9)
RBC.: 4.49 MIL/uL (ref 3.87–5.11)
Retic Count, Absolute: 42.2 10*3/uL (ref 19.0–186.0)
Retic Ct Pct: 0.9 % (ref 0.4–3.1)

## 2017-12-12 LAB — CBC WITH DIFFERENTIAL (CANCER CENTER ONLY)
Abs Immature Granulocytes: 0.01 10*3/uL (ref 0.00–0.07)
Basophils Absolute: 0 10*3/uL (ref 0.0–0.1)
Basophils Relative: 1 %
Eosinophils Absolute: 0 10*3/uL (ref 0.0–0.5)
Eosinophils Relative: 1 %
HCT: 28.9 % — ABNORMAL LOW (ref 36.0–46.0)
Hemoglobin: 8.6 g/dL — ABNORMAL LOW (ref 12.0–15.0)
Immature Granulocytes: 0 %
Lymphocytes Relative: 28 %
Lymphs Abs: 1.2 10*3/uL (ref 0.7–4.0)
MCH: 19.2 pg — ABNORMAL LOW (ref 26.0–34.0)
MCHC: 29.8 g/dL — ABNORMAL LOW (ref 30.0–36.0)
MCV: 64.4 fL — ABNORMAL LOW (ref 80.0–100.0)
Monocytes Absolute: 0.3 10*3/uL (ref 0.1–1.0)
Monocytes Relative: 8 %
Neutro Abs: 2.7 10*3/uL (ref 1.7–7.7)
Neutrophils Relative %: 62 %
Platelet Count: 116 10*3/uL — ABNORMAL LOW (ref 150–400)
RBC: 4.49 MIL/uL (ref 3.87–5.11)
RDW: 20.9 % — ABNORMAL HIGH (ref 11.5–15.5)
WBC Count: 4.3 10*3/uL (ref 4.0–10.5)
nRBC: 0 % (ref 0.0–0.2)

## 2017-12-12 LAB — LIPASE, BLOOD: Lipase: 31 U/L (ref 11–51)

## 2017-12-12 LAB — VITAMIN B12: Vitamin B-12: 843 pg/mL (ref 180–914)

## 2017-12-12 LAB — C-REACTIVE PROTEIN: CRP: <0.8 mg/dL (ref <1.0)

## 2017-12-12 LAB — FOLATE: Folate: 35.9 ng/mL (ref >5.9)

## 2017-12-12 LAB — AMYLASE: Amylase: 85 U/L (ref 28–100)

## 2017-12-12 NOTE — Progress Notes (Signed)
[facility] Initial Consultation  Patient Name:  Leah Wiggins  DOB: October 31, 1926   Date of Service: December 12, 2017  Referring Provider: Glendale Chard, MD [place], [place] [address]   Consulting Physician: Henreitta Leber, MD Hematology/Oncology  Reason for Referral: In the setting of anemia and thrombocytopenia of unclear etiology, she presents now for further diagnostic and therapeutic recommendations.  History Present Illness: Leah Wiggins is a 84 year old resident of [place] whose past medical history is significant for non-insulin-requiring diabetes mellitus; primary hypertension; elevated BMI; bilateral age-related hearing deficit; gastroesophageal reflux disease; osteoporosis; chronic pancreatitis; degenerative joint disease involving the right shoulder and feet; dyslipidemia; obstructive sleep apnea noncompliant with CPAP; peripheral arterial disease; active tobacco dependence; previous atopic dermatitis; chronic kidney injury; restless leg syndrome; and breast cancer identified initially in 1991.  She has been on ferrous sulfate once daily by mouth over unknown period.  Although those records are not available for review, by report, a right modified radical mastectomy was performed without further endocrine or cytotoxic chemotherapy.  She cannot recall any further details.  Her last screening mammogram on the left was in 2018, reportedly normal.  Her primary care physician is Dr. Glendale Chard.  She is accompanied by her attentive daughter Leah Wiggins.  Her memory is exceedingly poor for both distant and recent details.  Her daughter Leah Wiggins was the primary informant.  No medical records from Dr. Baird Cancer office were available at the time of this first visit.  They have been requested.  He has no history of coronary artery disease, angina, or myocardial infarction.  She has no cardiac dysrhythmia.  She denies  seizure disorder or stroke syndrome.  There is no thyroid disease.  Although she has gastroesophageal reflux disease, controlled with medication, she has no peptic ulcer disease.  She has had hepatitis in the past but cannot recall any details.  There is no viral hepatitis, inflammatory bowel disease, or symptomatic diverticulosis.  She cannot recall her last colonoscopy.    By report, she has chronic kidney injury.  There is no rheumatoid or gouty arthritis.  She has chronic pain in her feet which she attributes to "arthritis."  November 02, 2017 she presented to the emergency department at [facility] complaining of dyspnea and abdominal pain.  A cardiac evaluation revealed no EKG changes or elevated troponin levels.  No frank bleeding was identified on rectal examination.  A complete blood count showed hemoglobin 8.2 hematocrit 27.2 MCV 67.7 MCH 20.5 RDW 21.0 WBC 8.2; platelets 130,000.  A comprehensive metabolic panel was normal except for mildly elevated serum glucose and a mildly decreased calcium level.  Although she was found to be anemic, she was discharged home in stable condition with instructions to follow-up with her primary care provider.  Her overall energy level is fair.  She denies any significant appetite or weight deficit.  She has chronic dizziness, lightheadedness, and near syncope both with positional changes and walking.  She has no headache more syncopal episode.  She has no rash or itching.  There are no new visual changes.  Her vision however is poor.  She has not been to the dentist for many years.  She has a dry cough with occasional clear productive sputum.  There is no sore throat or orthopnea.  She denies any dyspnea at rest or on minimal exertion.  She ambulates with the use of a cane.  There is no pain or difficulty in swallowing.  No fever, shaking chills, sweats, or flulike symptoms are evident.  She has no heartburn or indigestion.    There is no nausea,  vomiting, diarrhea, or constipation.  She denies melena or bright red blood per rectum.  She reports no urinary frequency, urgency, hematuria, or dysuria.  Although she has mild swelling in her ankles at the end of the day, that swelling recedes overnight.  With the exception of pain in her right shoulder and feet, she denies any pain syndrome.  On an intermittent basis she has numbness and tingling in her fingers and toes.  It is with this background she presents now for further diagnostic and therapeutic recommendations in the setting of anemia and thrombocytopenia over an indefinite period without obvious explanation.  Past Medical History:  Diagnosis Date  . Arthritis    "shoulders" (05/04/2017)  . Breast cancer, right breast ([place]) 1991  . GERD (gastroesophageal reflux disease)   . Hyperlipidemia   . Hypertension   . Non-stress test nonreactive, 04/24/12, normal 12/10/2012  . OSA on CPAP    "don't wear it all the time" (05/04/2017)  . PVD (peripheral vascular disease) ([place])   . Tobacco use 12/10/2012  . Type II diabetes mellitus ([place])    Past Surgical History:  Procedure Laterality Date  . APPENDECTOMY    . BREAST BIOPSY Right 1991  . CARDIOVASCULAR STRESS TEST  04/24/2012   No significant ST segment change suggestive of ischemia.  Marland Kitchen CATARACT EXTRACTION, BILATERAL Bilateral   . JOINT REPLACEMENT    . LOWER EXTREMITY ARTERIAL DOPPLER Bilateral 05/07/2012   Left ABI-demonstrated mild arterial insufficiency. Right CIA 50-69% diameter reduction. Right SFA less than 50% diameter reduction. Left SFA 70-99% diameter reduction. Bilateral Runoff-Posterior tibial arteries appeared occluded.  Marland Kitchen MASTECTOMY PARTIAL / LUMPECTOMY W/ AXILLARY LYMPHADENECTOMY Right 1991   . TOTAL HIP ARTHROPLASTY Right 2011   Gynecologic History: Her menarche was at age 89 years.   Her last menses was in her 11s.   She reports no history of hormone replacement therapy. She was never on oral contraception.   Her last  screening unilateral mammogram on the left was in 2018, reportedly normal.  (No recent mammogram was identified on our electronic health system). Her last Pap smear was 3 years ago, reportedly normal.  Family History  Problem Relation Age of Onset  . Heart disease Mother   . Stroke Mother   . Stroke Father   . Cancer Sister        GIST  . Cancer Brother        thyroid   . Diabetes Son   . Stroke Son   Mother: Deceased: 22s: Stroke. Father: Deceased: 48s: MI. Phil has 5 brothers.  All are deceased.  One brother died of an unknown cancer. She has 2 sisters: One died of an unknown cancer.  Social History   Socioeconomic History  . Marital status: Widowed    Spouse name: Not on file  . Number of children: Not on file  . Years of education: Not on file  . Highest education level: Not on file  Occupational History  . Not on file  Social Needs  . Financial resource strain: Not on file  . Food insecurity:    Worry: Not on file    Inability: Not on file  . Transportation needs:    Medical: Not on file    Non-medical: Not on file  Tobacco Use  . Smoking status: Current Some Day Smoker    Packs/day:  0.25    Years: 70.00    Pack years: 17.50    Types: Cigarettes    Start date: 03/08/1947  . Smokeless tobacco: Never Used  Substance and Sexual Activity  . Alcohol use: No    Alcohol/week: 0.0 standard drinks  . Drug use: No  . Sexual activity: Never  Lifestyle  . Physical activity:    Days per week: Not on file    Minutes per session: Not on file  . Stress: Not on file  Relationships  . Social connections:    Talks on phone: Not on file    Gets together: Not on file    Attends religious service: Not on file    Active member of club or organization: Not on file    Attends meetings of clubs or organizations: Not on file    Relationship status: Not on file  . Intimate partner violence:    Fear of current or ex partner: Not on file    Emotionally abused: Not on file     Physically abused: Not on file    Forced sexual activity: Not on file  Other Topics Concern  . Not on file  Social History Narrative  . Not on file   Transfusion History: No prior transfusion  Exposure History: She has no prior exposure to toxic chemicals, radiation, or pesticides.   Allergies  Allergen Reactions  . Bactrim [Sulfamethoxazole-Trimethoprim] Swelling    Marked angioedema requiring hospitalization  . Penicillins Other (See Comments)    Has patient had a PCN reaction causing immediate rash, facial/tongue/throat swelling, SOB or lightheadedness with hypotension: Unk Has patient had a PCN reaction causing severe rash involving mucus membranes or skin necrosis: Unk Has patient had a PCN reaction that required hospitalization: Unk Has patient had a PCN reaction occurring within the last 10 years: No If all of the above answers are "NO", then may proceed with Cephalosporin use.   Current Outpatient Medications on File Prior to Visit  Medication Sig  . alendronate (FOSAMAX) 70 MG tablet Take 70 mg by mouth every Wednesday. Take with a full glass of water on an empty stomach  . aspirin EC 81 MG tablet Take 81 mg by mouth daily.  . ferrous sulfate 325 (65 FE) MG tablet Take 325 mg by mouth daily with breakfast.  . metFORMIN (GLUCOPHAGE-XR) 500 MG 24 hr tablet Take 500 mg by mouth 2 (two) times daily.  Marland Kitchen MICARDIS 40 MG tablet Take 40 mg by mouth daily.  . pantoprazole (PROTONIX) 40 MG tablet Take 1 tablet (40 mg total) by mouth daily at 12 noon.  . pravastatin (PRAVACHOL) 40 MG tablet Take 40 mg by mouth daily.   Marland Kitchen tiotropium (SPIRIVA) 18 MCG inhalation capsule Place 18 mcg into inhaler and inhale daily.   . clotrimazole-betamethasone (LOTRISONE) cream Apply to affected area 2 times daily prn (Patient not taking: Reported on 12/12/2017)  . diphenhydrAMINE (BENADRYL) 25 MG tablet Take 1 tablet (25 mg total) by mouth every 6 (six) hours for 7 days.  Marland Kitchen EPINEPHrine 0.3 mg/0.3 mL  IJ SOAJ injection Inject 0.3 mLs (0.3 mg total) into the muscle once as needed (prn worsening angioedema). (Patient not taking: Reported on 12/12/2017)  . hydrOXYzine (ATARAX/VISTARIL) 25 MG tablet Take 1 tablet (25 mg total) by mouth every 6 (six) hours. (Patient not taking: Reported on 12/12/2017)  . mometasone (ELOCON) 0.1 % lotion Apply 1 application topically daily as needed (IRRITATION).   Marland Kitchen mupirocin ointment (BACTROBAN) 2 % 1 application as  needed.    No current facility-administered medications on file prior to visit.     Review of Systems: Constitutional: No fever, sweats, or shaking chills.  No appetite or weight deficit; energy level fair. Skin: No rash, scaling, sores, lumps, or jaundice; history of atopic dermatitis. HEENT: No new visual changes; bilateral age-related hearing deficit. Pulmonary: No unusual cough, sore throat, or orthopnea; active tobacco dependence; DOE; obstructive sleep apnea noncompliant with CPAP. Breasts: A right modified radical mastectomy in 1991 for breast cancer (stage and postoperative treatment and/or follow-up unknown). Cardiovascular: No coronary artery disease, angina, or myocardial infarction.  No cardiac dysrhythmia; essential hypertension and dyslipidemia. Gastrointestinal: No indigestion, dysphagia, abdominal pain, diarrhea, or constipation.  No change in bowel habits; chronic pancreatitis; GERD.  No melena or bright red blood per rectum.  No nausea or vomiting. Genitourinary: No urinary frequency, urgency, hematuria, or dysuria. Musculoskeletal: Chronic arthralgias involving the shoulders and feet.  No myalgias; no joint swelling, pain, or instability. Hematologic: No bleeding tendency or easy bruisability; anemia and thrombocytopenia. Endocrine: No intolerance to heat or cold; no thyroid disease; non-insulin requiring diabetes mellitus. Vascular: No peripheral arterial disease; since 2010, a "clot" in the left groin has been monitored.  No venous  thromboembolic disease. Psychological: No anxiety, depression, or mood changes; no mental health illnesses. Neurological: Persistent dizziness, lightheadedness, and near syncope both him positional changes and when walking on an episodic basis; no headache or syncope.  Numbness and tingling of the fingers and toes on an intermittent basis.    Physical Examination: Vital Signs: Body surface area is 1.81 meters squared.  Vitals:   12/12/17 1314  BP: (!) 140/53  Pulse: 86  Resp: 18  Temp: 98.3 F (36.8 C)  SpO2: 98%    Filed Weights   12/12/17 1314  Weight: 163 lb 6.4 oz (74.1 kg)  ECOG PERFORMANCE STATUS: 1 Constitutional:  Leah Wiggins is a fully nourished and developed African-American albeit overweight.  She looks age appropriate.  She is friendly and cooperative without respiratory compromise at rest. Skin: No rashes, scaling, dryness, jaundice, or itching. HEENT: Head is normocephalic and atraumatic.  Pupils are equal round and reactive to light and accommodation.  Sclerae are anicteric.  Conjunctivae are pink.  No sinus tenderness nor oropharyngeal lesions.  Lips without cracking or peeling; tongue without mass, inflammation, or nodularity.  Mucous membranes are moist. Neck: Supple and symmetric.  No jugular venous distention or thyromegaly.  Trachea is midline. Lymphatics: No cervical or supraclavicular lymphadenopathy.  No epitrochlear, axillary, or inguinal lymphadenopathy is appreciated; she has lymphedema of the right upper extremity extending from the deltoid to the fingers. Breasts: In the left breast there is no mass, discharge, dimpling, or retraction. Respiratory/chest: There is a well-healed mastectomy scar over the right anterior chest wall without any nodularity or skin changes.  Breath sounds are diminished but clear to auscultation and percussion.  Normal excursion and respiratory effort. Back: Symmetric without deformity or tenderness. Cardiovascular: Heart rate  and rhythm are regular. Gastrointestinal: Abdomen is obese, soft, nontender; no organomegaly.  Bowel sounds are normoactive.  No masses are appreciated. Rectal examination: Not performed. Extremities: In the lower extremities, there is no asymmetric swelling, erythema, tenderness, or cord formation.  No clubbing, cyanosis, nor edema. Hematologic: No petechiae, hematomas, or ecchymoses. Psychological:  She is oriented to person, place, and time; normal affect. Neurological: There are no gross neurologic deficits.  Laboratory Results: I have reviewed the data as listed: CBC Latest Ref Rng & Units 11/02/2017 06/01/2017  05/05/2017  WBC 4.0 - 10.5 K/uL 4.5 9.3 7.9  Hemoglobin 12.0 - 15.0 g/dL 8.2(L) 10.5(L) 9.6(L)  Hematocrit 36.0 - 46.0 % 27.2(L) 32.9(L) 29.7(L)  Platelets 150 - 400 K/uL 130(L) PLATELET CLUMPS NOTED ON SMEAR, UNABLE TO ESTIMATE 87(L)    CMP Latest Ref Rng & Units 11/02/2017 06/01/2017 05/05/2017  Glucose 70 - 99 mg/dL 113(H) 83 202(H)  BUN 8 - 23 mg/dL 9 14 29(H)  Creatinine 0.44 - 1.00 mg/dL 0.85 0.88 1.13(H)  Sodium 135 - 145 mmol/L 144 139 139  Potassium 3.5 - 5.1 mmol/L 3.5 4.3 4.5  Chloride 98 - 111 mmol/L 110 103 109  CO2 22 - 32 mmol/L 24 23 20(L)  Calcium 8.9 - 10.3 mg/dL 8.5(L) 9.2 8.8(L)  Total Protein 6.5 - 8.1 g/dL 6.5 6.8 -  Total Bilirubin 0.3 - 1.2 mg/dL 0.7 0.5 -  Alkaline Phos 38 - 126 U/L 73 85 -  AST 15 - 41 U/L 24 33 -  ALT 0 - 44 U/L 20 24 -   Diagnostic/Imaging Studies: November 02, 2017 CT ABDOMEN AND PELVIS WITHOUT CONTRAST  TECHNIQUE: Multidetector CT imaging of the abdomen and pelvis was performed following the standard protocol without IV contrast.  COMPARISON:  06/01/2017  FINDINGS: Lower chest: Normal heart size without pericardial effusion. No acute pulmonary consolidation, effusion or pneumothorax. No dominant mass.  Hepatobiliary: No focal liver abnormality is seen. No gallstones, gallbladder wall thickening, or biliary  dilatation.  Pancreas: Scattered pancreatic calcifications consistent with stigmata of chronic pancreatitis. No acute inflammation, ductal dilatation or mass given limitations of a noncontrast study.  Spleen: Normal size spleen without mass.  Adrenals/Urinary Tract: Normal bilateral adrenal glands. No nephrolithiasis nor hydroureteronephrosis. Small bilateral parapelvic renal cysts are redemonstrated. The urinary bladder is unremarkable without focal mural thickening or calculi.  Stomach/Bowel: Small hiatal hernia. Decompressed stomach. The duodenal sweep and ligament of Treitz are normal. No bowel obstruction or inflammation. The distal and terminal ileum are normal. What appears to represent the appendix is normal in caliber without inflammatory change.  Vascular/Lymphatic: Moderate aortoiliac atherosclerosis. No lymphadenopathy by CT size criteria.  Reproductive: Uterus and bilateral adnexa are unremarkable.  Other: No free air nor free fluid. Small bilateral fat containing inguinal hernias.  Musculoskeletal: Partially included uncemented right hip arthroplasty without complicating features. Mild degenerative disc disease of the included lower thoracic and upper lumbar spine to L3.  IMPRESSION: 1. Diminutive normal caliber to appearance of the appendix. No pericecal inflammation. No bowel obstruction or inflammatory change. 2. Stigmata of chronic pancreatitis with scattered pancreatic gland calcifications. 3. Aortoiliac atherosclerosis without aneurysm. 4. Bilateral small parapelvic renal cysts.  Leah Wiggins M.D.  June 01, 2017 CT ABDOMEN AND PELVIS WITH CONTRAST  TECHNIQUE: Multidetector CT imaging of the abdomen and pelvis was performed using the standard protocol following bolus administration of intravenous contrast.  CONTRAST:  141mL ISOVUE-300 IOPAMIDOL (ISOVUE-300) INJECTION 61%  COMPARISON:  Chest CTA 09/07/2016.  CT Pelvis  11/22/2009  FINDINGS: Lower chest: Stable lung bases since 2018. No cardiomegaly or pericardial effusion. Calcified aortic atherosclerosis. No pleural effusion. Minor lung base atelectasis and/or scarring.  Hepatobiliary: Negative liver and gallbladder.  Pancreas: Punctate parenchymal calcifications scattered throughout the entire pancreas. No significant pancreatic atrophy. No ductal dilatation. No inflammation.  Spleen: Negative.  Adrenals/Urinary Tract: Normal adrenal glands. Bilateral renal enhancement and contrast excretion is symmetric and within normal limits. There are small benign bilateral renal parapelvic cysts. Occasional small benign renal cortical cyst. Unremarkable course of both ureters; the distal  right ureter is obscured by streak artifact in the pelvis.  Some of the urinary bladder is obscured by streak artifact. The visible bladder is unremarkable.  Stomach/Bowel: Negative rectum and sigmoid colon aside from redundancy and some retained stool. Decompressed and negative left colon. Mildly redundant but otherwise negative transverse colon. Mild retained stool in the right colon. Normal terminal ileum. Diminutive or absent appendix. No pericecal inflammation.  No dilated or abnormal small bowel loops are identified. The stomach is decompressed. The duodenum is unremarkable.  No abdominal free air or free fluid.  Vascular/Lymphatic: Aortoiliac calcified atherosclerosis. Major arterial structures in the abdomen and pelvis remain patent. There is up to moderate stenosis of the distal abdominal aorta (series 3, image 36).  The portal venous system appears patent.  No lymphadenopathy.  Reproductive: Within normal limits.  Other: Streak artifact through the pelvis from the right hip hardware. No pelvic free fluid is evident.  Musculoskeletal: Chronic degenerative changes in the spine. Previous right hip arthroplasty. No acute osseous  abnormality identified.  IMPRESSION: 1. No acute or inflammatory process identified in the Abdomen or pelvis. 2. Aortic Atherosclerosis (ICD10-I70.0) with up to moderate stenosis of the distal abdominal aorta suspected. 3. Chronic calcific pancreatitis.  No active inflammation.  Leah Wiggins M.D.  Summary/Assessment: In the setting of anemia and thrombocytopenia of unclear etiology, she presents now for further diagnostic and therapeutic recommendations.  Although those records are not available for review, by report, a modified radical mastectomy was performed on the right was performed in 1991, apparently without further endocrine or cytotoxic chemotherapy.  She cannot recall any further details.  Her last screening mammogram on the left was in 2018, reportedly normal.  Those records are unavailable on the existing chart.    Her memory is exceedingly poor for both distant and recent details.  Her daughter Leah Wiggins was the primary informant.  No medical records from Dr. Baird Cancer office were available at the time of this first visit.  They have been requested.  He has no history of coronary artery disease, angina, or myocardial infarction.  She has no cardiac dysrhythmia.  She denies seizure disorder or stroke syndrome.  There is no thyroid disease.  Although she has gastroesophageal reflux disease, controlled with medication, she has no peptic ulcer disease.  She has had pancreatitis in the past but cannot recall any details.  There is no viral hepatitis, inflammatory bowel disease, or symptomatic diverticulosis.  She cannot recall her last colonoscopy.    By report, she has chronic kidney injury.  There is no rheumatoid or gouty arthritis.  She has chronic pain in her feet which she attributes to "arthritis."  On November 02, 2017 she presented to the emergency department at [facility] complaining of dyspnea and abdominal pain.  A cardiac evaluation revealed no EKG changes or elevated  troponin levels.  No frank bleeding was identified on rectal examination.  A complete blood count showed hemoglobin 8.2 hematocrit 27.2 MCV 67.7 MCH 20.5 RDW 21.0 WBC 8.2; platelets 130,000.  A comprehensive metabolic panel was normal except for mildly elevated serum glucose and a mildly decreased calcium level.  Although she was found to be anemic, she was discharged to home in stable condition with instructions to follow-up with her primary care provider, Dr. Baird Cancer.  Her overall energy level is fair.  She denies any significant appetite or weight deficit.  She has chronic dizziness, lightheadedness, and near syncope both with positional changes and walking.  She has no  headache more syncopal episode.  She has no rash or itching.  There are no new visual changes.  Her vision however is poor.  She has not been to the dentist for many years.  She has a dry cough with occasional clear productive sputum.  There is no sore throat or orthopnea.  She denies any dyspnea at rest or on minimal exertion.  She ambulates with the use of a cane.  There is no pain or difficulty in swallowing.  No fever, shaking chills, sweats, or flulike symptoms are evident.  She has no heartburn or indigestion.    There is no nausea, vomiting, diarrhea, or constipation.  She denies melena or bright red blood per rectum.  She reports no urinary frequency, urgency, hematuria, or dysuria.  Although she has mild swelling in her ankles at the end of the day, that swelling recedes overnight.  With the exception of pain in her right shoulder and feet, she denies any pain syndrome.  On an intermittent basis she has numbness and tingling in her fingers and toes.  Her other comorbid problems include non-insulin-requiring diabetes mellitus; primary hypertension; elevated BMI; bilateral age-related hearing deficit; gastroesophageal reflux disease; osteoporosis; chronic pancreatitis; degenerative joint disease involving the right shoulder and foot;  dyslipidemia; obstructive sleep apnea noncompliant with CPAP; peripheral arterial disease; active tobacco dependence; previous atopic dermatitis; chronic kidney injury; restless leg syndrome; and breast cancer identified initially in 1991.  She has been on ferrous sulfate once daily by mouth over unknown period.  Recommendation/Plan: The results of her laboratory studies from today were not available at the time of discharge.    We discussed in lengthy detail her complex medical history, prior laboratory studies, absence of primary documentation regarding her prior breast cancer and treatment, imaging studies, and importance and methodology for evaluating anemia and thrombocytopenia.  She is were answered to their satisfaction.  A battery of laboratory studies were obtained to exclude an underlying iron deficiency, nutritional deficit, paraproteinemia, hemolytic process, metabolic anomaly, Leah deficiency, inflammatory process, or hemoglobinopathy.  Those results are pending.  The prior medical records including notes and laboratory studies from Dr. Baird Cancer have been requested.   It was strongly recommended that she consider discontinuing completely cigarette smoking.  She should discuss either pharmacologic and/or behavioral methods through her primary care physician.  She was encouraged also to stay up-to-date through her primary care provider on all of her immunizations including the seasonal influenza vaccine and the pneumococcal vaccines (2).  Barring any unforeseen complications, her next scheduled doctor visit to discuss the results of the above evaluation is on October 23.  Both Leah Wiggins and Leah Wiggins were advised to call us in the interim should any new or untoward problems arise.  The total time spent discussing the previous laboratory studies confirming both anemia and thrombocytopenia, methodology for evaluating them,  preliminary considerations and recommendations was 60 minutes.  At  least 50% of that time was spent in discussion, reviewing outside records, laboratory evaluation, counseling, and answering questions. All questions were answered to her satisfaction. .  This note was dictated using voice activated technology/software.  Unfortunately, typographical errors are not uncommon, and transcription is subject to mistakes and regrettably misinterpretation.  If necessary, clarification of the above information can be discussed with me at any time.  Thank you Dr. Baird Cancer for allowing my participation in the care of Leah Wiggins. I will keep you closely informed as the results of her preliminary laboratory data become available.  Please do not hesitate  to call should any questions arise regarding this initial consultation and discussion.  FOLLOW UP: AS DIRECTED   cc:     Glendale Chard, MD  Henreitta Leber, MD  Hematology/Oncology [place] [address] [place], [place] [address] Office: [phone] EUMP: [phone]

## 2017-12-12 NOTE — Patient Instructions (Signed)
December 12, 2017 We discussed in detail your past medical history including the anemia (decreased RBC) and low platelets.  Laboratory studies will be obtained to identify the cause of the anemia and low platelets.  Barring any unforeseen complications, your next scheduled doctor visit to review those results he is on December 27, 2017.  Please do not hesitate to call if any questions or problems arise in the interim.  Thank you! Ladona Ridgel, MD Hematology/Oncology [phone]

## 2017-12-12 NOTE — Telephone Encounter (Signed)
Inez Catalina from [place] called regarding pt she left a v/m stating she needed to speak to [place] about her nebulizer.  Returned her call she stated pt is stating she has been having SOB and coughing when she is tired. [facility] offers oximetry testing and she will fax [place] the order to have this test done. YRL,RMA

## 2017-12-12 NOTE — Telephone Encounter (Signed)
Appts scheduled avs/calendar printed per 10/8 los

## 2017-12-13 ENCOUNTER — Other Ambulatory Visit: Payer: Self-pay | Admitting: Hematology and Oncology

## 2017-12-13 LAB — GLIA (IGA/G) + TTG IGA
Antigliadin Abs, IgA: 5 units (ref 0–19)
Gliadin IgG: 2 units (ref 0–19)
Tissue Transglutaminase Ab, IgA: <2 U/mL (ref 0–3)

## 2017-12-13 LAB — PROTEIN ELECTROPHORESIS, SERUM
A/G Ratio: 1.1 (ref 0.7–1.7)
Albumin ELP: 3.5 g/dL (ref 2.9–4.4)
Alpha-1-Globulin: 0.2 g/dL (ref 0.0–0.4)
Alpha-2-Globulin: 1 g/dL (ref 0.4–1.0)
Beta Globulin: 1.1 g/dL (ref 0.7–1.3)
Gamma Globulin: 0.9 g/dL (ref 0.4–1.8)
Globulin, Total: 3.2 g/dL (ref 2.2–3.9)
Total Protein ELP: 6.7 g/dL (ref 6.0–8.5)

## 2017-12-13 LAB — IRON AND TIBC
Iron: 17 ug/dL — ABNORMAL LOW (ref 41–142)
Saturation Ratios: 4 % — ABNORMAL LOW (ref 21–57)
TIBC: 383 ug/dL (ref 236–444)
UIBC: 365 ug/dL

## 2017-12-13 LAB — ANTIPHOSPHOLIPID SYNDROME PROF
Anticardiolipin IgG: <9 GPL U/mL (ref 0–14)
Anticardiolipin IgM: <9 MPL U/mL (ref 0–12)
DRVVT: 35.3 s (ref 0.0–47.0)
PTT Lupus Anticoagulant: 27.8 s (ref 0.0–51.9)

## 2017-12-13 LAB — HEMOGLOBINOPATHY EVALUATION
Hgb A2 Quant: 2.1 % (ref 1.8–3.2)
Hgb A: 97.9 % (ref 96.4–98.8)
Hgb C: 0 %
Hgb F Quant: 0 % (ref 0.0–2.0)
Hgb S Quant: 0 %
Hgb Variant: 0 %

## 2017-12-13 LAB — IMMUNOFIXATION ELECTROPHORESIS
IgA: 297 mg/dL (ref 64–422)
IgG (Immunoglobin G), Serum: 1028 mg/dL (ref 700–1600)
IgM (Immunoglobulin M), Srm: 27 mg/dL (ref 26–217)
Total Protein ELP: 6.5 g/dL (ref 6.0–8.5)

## 2017-12-13 LAB — KAPPA/LAMBDA LIGHT CHAINS
Kappa free light chain: 23.1 mg/L — ABNORMAL HIGH (ref 3.3–19.4)
Kappa, lambda light chain ratio: 1.33 (ref 0.26–1.65)
Lambda free light chains: 17.4 mg/L (ref 5.7–26.3)

## 2017-12-13 LAB — BETA-2-GLYCOPROTEIN I ABS, IGG/M/A
Beta-2 Glyco I IgG: <9 GPI IgG units (ref 0–20)
Beta-2-Glycoprotein I IgA: <9 GPI IgA units (ref 0–25)
Beta-2-Glycoprotein I IgM: <9 GPI IgM units (ref 0–32)

## 2017-12-13 LAB — FERRITIN: Ferritin: 10 ng/mL — ABNORMAL LOW (ref 11–307)

## 2017-12-13 LAB — TSH: TSH: 0.243 u[IU]/mL — ABNORMAL LOW (ref 0.308–3.960)

## 2017-12-13 LAB — HAPTOGLOBIN: Haptoglobin: 214 mg/dL — ABNORMAL HIGH (ref 34–200)

## 2017-12-14 LAB — COPPER, SERUM: Copper: 149 ug/dL (ref 72–166)

## 2017-12-25 ENCOUNTER — Telehealth: Payer: Self-pay | Admitting: Hematology and Oncology

## 2017-12-25 ENCOUNTER — Encounter: Payer: Self-pay | Admitting: Hematology and Oncology

## 2017-12-25 ENCOUNTER — Inpatient Hospital Stay (HOSPITAL_BASED_OUTPATIENT_CLINIC_OR_DEPARTMENT_OTHER): Payer: Medicare Other | Admitting: Hematology and Oncology

## 2017-12-25 VITALS — BP 120/83 | HR 88 | Temp 98.9°F | Resp 14 | Ht 63.0 in | Wt 161.9 lb

## 2017-12-25 DIAGNOSIS — Z9011 Acquired absence of right breast and nipple: Secondary | ICD-10-CM | POA: Diagnosis not present

## 2017-12-25 DIAGNOSIS — Z853 Personal history of malignant neoplasm of breast: Secondary | ICD-10-CM

## 2017-12-25 DIAGNOSIS — G8929 Other chronic pain: Secondary | ICD-10-CM

## 2017-12-25 DIAGNOSIS — I129 Hypertensive chronic kidney disease with stage 1 through stage 4 chronic kidney disease, or unspecified chronic kidney disease: Secondary | ICD-10-CM

## 2017-12-25 DIAGNOSIS — D500 Iron deficiency anemia secondary to blood loss (chronic): Secondary | ICD-10-CM | POA: Insufficient documentation

## 2017-12-25 DIAGNOSIS — I10 Essential (primary) hypertension: Secondary | ICD-10-CM

## 2017-12-25 DIAGNOSIS — D696 Thrombocytopenia, unspecified: Secondary | ICD-10-CM | POA: Diagnosis not present

## 2017-12-25 DIAGNOSIS — K922 Gastrointestinal hemorrhage, unspecified: Secondary | ICD-10-CM

## 2017-12-25 DIAGNOSIS — N189 Chronic kidney disease, unspecified: Secondary | ICD-10-CM | POA: Diagnosis not present

## 2017-12-25 DIAGNOSIS — E1122 Type 2 diabetes mellitus with diabetic chronic kidney disease: Secondary | ICD-10-CM

## 2017-12-25 DIAGNOSIS — M79672 Pain in left foot: Secondary | ICD-10-CM

## 2017-12-25 DIAGNOSIS — M79671 Pain in right foot: Secondary | ICD-10-CM | POA: Diagnosis not present

## 2017-12-25 MED ORDER — FERROUS SULFATE 325 (65 FE) MG PO TBEC: 325.0000 mg | DELAYED_RELEASE_TABLET | Freq: Three times a day (TID) | ORAL | 2 refills | Status: DC

## 2017-12-25 NOTE — Progress Notes (Signed)
[facility] Outpatient Progress Note  Patient Name:  Leah Wiggins  DOB: 14-Feb-1927   Date of Service: December 25, 2017  Referring Provider: Glendale Chard, MD [address] [place], [place] [address]   Consulting Physician: Henreitta Leber, MD Hematology/Oncology  Reason for Visit: In the setting of anemia and thrombocytopenia of unclear etiology, she presents now for the results of her preliminary laboratory evaluation with recommendations.  Brief History: Leah Wiggins is a 84 year old resident of [place] whose past medical history is significant for non-insulin-requiring diabetes mellitus; primary hypertension; elevated BMI; bilateral age-related hearing deficit; gastroesophageal reflux disease; osteoporosis; chronic pancreatitis; degenerative joint disease involving the right shoulder and feet; dyslipidemia; obstructive sleep apnea noncompliant with CPAP; peripheral arterial disease; active tobacco dependence; previous atopic dermatitis; chronic kidney injury; restless leg syndrome; and breast cancer identified initially in 1991.  She has been on ferrous sulfate once daily by mouth over unknown period.  She discontinued it on her own.  Although those records are not available for review, by report, a right modified radical mastectomy was performed without further endocrine or cytotoxic chemotherapy.  She cannot recall any further details.  Her last screening mammogram on the left was in 2018, reportedly normal.  Her primary care physician is Dr. Glendale Chard.  She is accompanied by her attentive daughter Leah Wiggins.  Her memory is exceedingly poor for both distant and recent details.  Her daughter Leah Wiggins was the primary informant. Although she has gastroesophageal reflux disease, controlled with medication, she has no peptic ulcer disease. There is no viral hepatitis, inflammatory bowel disease, or symptomatic diverticulosis.   She cannot recall her last colonoscopy.  She has been followed by Dr. Juanita Craver, gastroenterology, in the past.  She has chronic pain in her feet which she attributes to "arthritis."    In November 02, 2017 she presented to the emergency department at [facility] complaining of dyspnea and abdominal pain.  A cardiac evaluation revealed no EKG changes or elevated troponin levels.  No frank bleeding was identified on rectal examination.  A complete blood count showed hemoglobin 8.2 hematocrit 27.2 MCV 67.7 MCH 20.5 RDW 21.0 WBC 8.2; platelets 130,000.  A comprehensive metabolic panel was normal except for mildly elevated serum glucose and a mildly decreased calcium level.  Although she was found to be anemic, she was discharged home in stable condition with instructions to follow-up with her primary care provider.  At the time of her initial visit, we discussed in lengthy detail her complex medical history, prior laboratory studies, absence of primary documentation regarding her prior breast cancer and treatment, imaging studies, and importance and methodology for evaluating anemia and thrombocytopenia.  A battery of laboratory studies were obtained to exclude an underlying iron deficiency, nutritional deficit, paraproteinemia, hemolytic process, metabolic anomaly, copper deficiency, inflammatory process, or hemoglobinopathy.  It was strongly recommended that she consider discontinuing completely cigarette smoking.    It was recommended that she discuss either pharmacologic and/or behavioral methods through her primary care physician.  Additionally, she was encouraged also to stay up-to-date through her primary care provider on all of her immunizations including the seasonal influenza vaccine and the pneumococcal vaccines (2).  It is with this background she presents now for the results of her preliminary evaluation with recommendations in the setting of anemia secondary to iron deficiency and mild  thrombocytopenia.  Interval History: In the interim since she reports no new problems or complaints. Her overall energy level is fair.  She denies any significant appetite or weight deficit.    She is exceedingly hard of hearing.  She has chronic dizziness, lightheadedness, and near syncope both with positional changes and walking.  She has no headache more syncopal episode.  She has no rash or itching. There are no new visual changes.  Her vision however is poor. She has not been to the dentist for many years. She has a dry cough with occasional clear productive sputum.  There is no sore throat or orthopnea.  She denies any dyspnea at rest or on minimal exertion.  She ambulates with the use of a cane.  There is no pain or difficulty in swallowing.  No fever, shaking chills, sweats, or flulike symptoms are evident.  She has no heartburn or indigestion. There is no nausea, vomiting, diarrhea, or constipation.  She denies melena or bright red blood per rectum.  She reports no urinary frequency, urgency, hematuria, or dysuria. Although she has mild swelling in her ankles at the end of the day, that swelling recedes overnight.  With the exception of pain in her right shoulder and feet, she denies any pain syndrome.  On an intermittent basis she has numbness and tingling in her fingers and toes  Past Medical History Reviewed        Family History Reviewed       Social History Reviewed     Past Medical History:  Diagnosis Date  . Arthritis    "shoulders" (05/04/2017)  . Breast cancer, right breast ([facility]) 1991  . GERD (gastroesophageal reflux disease)   . Hyperlipidemia   . Hypertension   . Non-stress test nonreactive, 04/24/12, normal 12/10/2012  . OSA on CPAP    "don't wear it all the time" (05/04/2017)  . PVD (peripheral vascular disease) ([facility])   . Tobacco use 12/10/2012  . Type II diabetes mellitus ([facility])    Allergies  Allergen Reactions  . Bactrim [Sulfamethoxazole-Trimethoprim] Swelling     Marked angioedema requiring hospitalization  . Penicillins Other (See Comments)    Has patient had a PCN reaction causing immediate rash, facial/tongue/throat swelling, SOB or lightheadedness with hypotension: Unk Has patient had a PCN reaction causing severe rash involving mucus membranes or skin necrosis: Unk Has patient had a PCN reaction that required hospitalization: Unk Has patient had a PCN reaction occurring within the last 10 years: No If all of the above answers are "NO", then may proceed with Cephalosporin use.   Current Outpatient Medications on File Prior to Visit  Medication Sig  . alendronate (FOSAMAX) 70 MG tablet Take 70 mg by mouth every Wednesday. Take with a full glass of water on an empty stomach  . aspirin EC 81 MG tablet Take 81 mg by mouth daily.  . clotrimazole-betamethasone (LOTRISONE) cream Apply to affected area 2 times daily prn (Patient not taking: Reported on 12/12/2017)  . diphenhydrAMINE (BENADRYL) 25 MG tablet Take 1 tablet (25 mg total) by mouth every 6 (six) hours for 7 days.  Marland Kitchen EPINEPHrine 0.3 mg/0.3 mL IJ SOAJ injection Inject 0.3 mLs (0.3 mg total) into the muscle once as needed (prn worsening angioedema). (Patient not taking: Reported on 12/12/2017)  . ferrous sulfate 325 (65 FE) MG tablet Take 325 mg by mouth daily with breakfast.  . hydrOXYzine (ATARAX/VISTARIL) 25 MG tablet Take 1 tablet (25 mg total) by mouth every 6 (six) hours. (Patient not taking: Reported on 12/12/2017)  . metFORMIN (GLUCOPHAGE-XR) 500 MG 24 hr tablet Take 500 mg by mouth 2 (two) times  daily.  Marland Kitchen MICARDIS 40 MG tablet Take 40 mg by mouth daily.  . mometasone (ELOCON) 0.1 % lotion Apply 1 application topically daily as needed (IRRITATION).   Marland Kitchen mupirocin ointment (BACTROBAN) 2 % 1 application as needed.   . pantoprazole (PROTONIX) 40 MG tablet Take 1 tablet (40 mg total) by mouth daily at 12 noon.  . pravastatin (PRAVACHOL) 40 MG tablet Take 40 mg by mouth daily.   Marland Kitchen tiotropium  (SPIRIVA) 18 MCG inhalation capsule Place 18 mcg into inhaler and inhale daily.    No current facility-administered medications on file prior to visit.     Review of Systems: Constitutional: No fever, sweats, or shaking chills.  No appetite or weight deficit; energy level fair. Skin: No rash, scaling, sores, lumps, or jaundice; history of atopic dermatitis. HEENT: No new visual changes; bilateral age-related hearing deficit. Pulmonary: No unusual cough, sore throat, or orthopnea; active tobacco dependence; DOE; obstructive sleep apnea noncompliant with CPAP. Breasts: A right modified radical mastectomy in 1991 for breast cancer (stage and postoperative treatment and/or follow-up unknown). Cardiovascular: No coronary artery disease, angina, or myocardial infarction.  No cardiac dysrhythmia; essential hypertension and dyslipidemia. Gastrointestinal: No indigestion, dysphagia, abdominal pain, diarrhea, or constipation.  No change in bowel habits; chronic pancreatitis; GERD.  No melena or bright red blood per rectum.  No nausea or vomiting. Genitourinary: No urinary frequency, urgency, hematuria, or dysuria. Musculoskeletal: Chronic arthralgias involving the shoulders and feet.  No myalgias; no joint swelling, pain, or instability. Hematologic: No bleeding tendency or easy bruisability; anemia and thrombocytopenia. Endocrine: No intolerance to heat or cold; no thyroid disease; non-insulin requiring diabetes mellitus. Vascular: No peripheral arterial disease; since 2010, a "clot" in the left groin has been monitored.  No venous thromboembolic disease. Psychological: No anxiety, depression, or mood changes; no mental health illnesses. Neurological: Persistent dizziness, lightheadedness, and near syncope both him positional changes and when walking on an episodic basis; no headache or syncope.  Numbness and tingling of the fingers and toes on an intermittent basis.    Physical Examination: Vital Signs:  Body surface area is 1.81 meters squared.  Vitals:   12/25/17 1412  BP: 120/83  Pulse: 88  Resp: 14  Temp: 98.9 F (37.2 C)  SpO2: 100%    Filed Weights   12/25/17 1412  Weight: 161 lb 14.4 oz (73.4 kg)  Body mass index is 28.68 kg/m. Constitutional:  Martha Ellerby is a fully nourished and developed African-American albeit overweight.  She looks age appropriate.  She is friendly and cooperative without respiratory compromise at rest. Skin: No rashes, scaling, dryness, jaundice, or itching. HEENT: Head is normocephalic and atraumatic.  Pupils are equal round and reactive to light and accommodation.  Sclerae are anicteric.  Conjunctivae are pink.  No sinus tenderness nor oropharyngeal lesions.  Lips without cracking or peeling; tongue without mass, inflammation, or nodularity.  Mucous membranes are moist.  She is hard of hearing. Neck: Supple and symmetric.  No jugular venous distention or thyromegaly.  Trachea is midline. Lymphatics: No cervical or supraclavicular lymphadenopathy.  No epitrochlear, axillary, or inguinal lymphadenopathy is appreciated; she has lymphedema of the right upper extremity extending from the deltoid to the fingers. Respiratory/chest: There is a well-healed mastectomy scar over the right anterior chest wall without any nodularity or skin changes.  Breath sounds are diminished but clear to auscultation and percussion.  Normal excursion and respiratory effort. Back: Symmetric without deformity or tenderness. Cardiovascular: Heart rate and rhythm are regular. Gastrointestinal: Abdomen is  obese, soft, nontender; no organomegaly.  Bowel sounds are normoactive.  No masses are appreciated. Rectal examination: Not performed. Extremities: In the lower extremities, there is no asymmetric swelling, erythema, tenderness, or cord formation.  No clubbing, cyanosis, nor edema. Hematologic: No petechiae, hematomas, or ecchymoses. Psychological:  She is oriented to person,  place, and time; normal affect; memory and cognitive deficit. Neurological: There are no gross neurologic deficits.  Laboratory Results: December 12, 2017  Ref Range & Units 13d ago  WBC Count 4.0 - 10.5 K/uL 4.3   RBC 3.87 - 5.11 MIL/uL 4.49   Hemoglobin 12.0 - 15.0 g/dL 8.6Low    Comment: Reticulocyte Hemoglobin testing  may be clinically indicated,  consider ordering this additional  test [id number]   HCT 36.0 - 46.0 % 28.9Low    MCV 80.0 - 100.0 fL 64.4Low    MCH 26.0 - 34.0 pg 19.2Low    MCHC 30.0 - 36.0 g/dL 29.8Low    RDW 11.5 - 15.5 % 20.9High    Platelet Count 150 - 400 K/uL 116Low    Comment: PLATELET COUNT CONFIRMED BY SMEAR  nRBC 0.0 - 0.2 % 0.0   Neutrophils Relative % % 62   Neutro Abs 1.7 - 7.7 K/uL 2.7   Lymphocytes Relative % 28   Lymphs Abs 0.7 - 4.0 K/uL 1.2   Monocytes Relative % 8   Monocytes Absolute 0.1 - 1.0 K/uL 0.3   Eosinophils Relative % 1   Eosinophils Absolute 0.0 - 0.5 K/uL 0.0   Basophils Relative % 1   Basophils Absolute 0.0 - 0.1 K/uL 0.0   Immature Granulocytes % 0   Abs Immature Granulocytes 0.00 - 0.07 K/uL 0.01   Target Cells  PRESENT   Platelet Morphology  LARGE PLT SEEN   Reticulocyte count 0.9% Vitamin B70 488 Folic acid 89.1 Serum copper 149 Ferritin 10 Iron/TIBC 17/383 Iron saturation 4% Hemoglobin electrophoresis: Normal adult hemoglobin present. Serum protein electrophoresis, immunofixation, and free kappa/lambda light chain analysis: No monoclonal M spike identified. Free kappa light chain 23.1 Free lambda light chain 17.4 Free kappa/lambda light chain ratio 1.33 CRP 0.8 Amylase 85 Lipase 31  Ref Range & Units 13d ago  Antigliadin Abs, IgA 0 - 19 units 5   Comment: (NOTE)           Negative          0 - 19           Weak Positive       20 - 30           Moderate to Strong Positive  >30   Gliadin IgG 0 - 19 units 2   Comment: (NOTE)           Negative           0 - 19           Weak Positive       20 - 30           Moderate to Strong Positive  >30   Tissue Transglutaminase Ab, IgA 0 - 3 U/mL <2   Comment: (NOTE)                Negative    0 - 3                Weak Positive  4 - 10                Positive      >  10  Tissue Transglutaminase (tTG) has been identified  as the endomysial antigen. Studies have demonstr-  ated that endomysial IgA antibodies have over 99%  specificity for gluten sensitive enteropathy.  Performed At: [facility]  [place], [place] [id number]  TSH 0.243 (0.308-3.96)  Ref Range & Units 13d ago  Anticardiolipin IgG 0 - 14 GPL U/mL <9   Comment: (NOTE)              Negative:       <15              Indeterminate:   15 - 20              Low-Med Positive: >20 - 80              High Positive:     >80   Anticardiolipin IgM 0 - 12 MPL U/mL <9   Comment: (NOTE)              Negative:       <13              Indeterminate:   13 - 20              Low-Med Positive: >20 - 80              High Positive:     >80   PTT Lupus Anticoagulant 0.0 - 51.9 sec 27.8   DRVVT 0.0 - 47.0 sec 35.3   Lupus Anticoag Interp  Comment:   Comment: (NOTE)      CMP Latest Ref Rng & Units 11/02/2017 06/01/2017 05/05/2017  Glucose 70 - 99 mg/dL 113(H) 83 202(H)  BUN 8 - 23 mg/dL 9 14 29(H)  Creatinine 0.44 - 1.00 mg/dL 0.85 0.88 1.13(H)  Sodium 135 - 145 mmol/L 144 139 139  Potassium 3.5 - 5.1 mmol/L 3.5 4.3 4.5  Chloride 98 - 111 mmol/L 110 103 109  CO2 22 - 32 mmol/L 24 23 20(L)  Calcium 8.9 - 10.3 mg/dL 8.5(L) 9.2 8.8(L)  Total Protein 6.5 - 8.1 g/dL 6.5 6.8 -  Total Bilirubin 0.3 - 1.2 mg/dL 0.7 0.5 -  Alkaline Phos 38 - 126 U/L 73 85 -  AST 15 - 41 U/L 24 33 -  ALT 0 - 44 U/L 20 24 -   Diagnostic/Imaging  Studies: November 02, 2017 CT ABDOMEN AND PELVIS WITHOUT CONTRAST  TECHNIQUE: Multidetector CT imaging of the abdomen and pelvis was performed following the standard protocol without IV contrast.  COMPARISON: 06/01/2017  FINDINGS: Lower chest: Normal heart size without pericardial effusion. No acute pulmonary consolidation, effusion or pneumothorax. No dominant mass.  Hepatobiliary: No focal liver abnormality is seen. No gallstones, gallbladder wall thickening, or biliary dilatation.  Pancreas: Scattered pancreatic calcifications consistent with stigmata of chronic pancreatitis. No acute inflammation, ductal dilatation or mass given limitations of a noncontrast study.  Spleen: Normal size spleen without mass.  Adrenals/Urinary Tract: Normal bilateral adrenal glands. No nephrolithiasis nor hydroureteronephrosis. Small bilateral parapelvic renal cysts are redemonstrated. The urinary bladder is unremarkable without focal mural thickening or calculi.  Stomach/Bowel: Small hiatal hernia. Decompressed stomach. The duodenal sweep and ligament of Treitz are normal. No bowel obstruction or inflammation. The distal and terminal ileum are normal. What appears to represent the appendix is normal in caliber without inflammatory change.  Vascular/Lymphatic: Moderate aortoiliac atherosclerosis. No lymphadenopathy by CT size criteria.  Reproductive: Uterus and bilateral adnexa are unremarkable.  Other: No free air nor free  fluid. Small bilateral fat containing inguinal hernias.  Musculoskeletal: Partially included uncemented right hip arthroplasty without complicating features. Mild degenerative disc disease of the included lower thoracic and upper lumbar spine to L3.  IMPRESSION: 1. Diminutive normal caliber to appearance of the appendix. No pericecal inflammation. No bowel obstruction or inflammatory change. 2. Stigmata of chronic pancreatitis with scattered pancreatic  gland calcifications. 3. Aortoiliac atherosclerosis without aneurysm. 4. Bilateral small parapelvic renal cysts.  Ashley Royalty M.D.  June 01, 2017 CT ABDOMEN AND PELVIS WITH CONTRAST  TECHNIQUE: Multidetector CT imaging of the abdomen and pelvis was performed using the standard protocol following bolus administration of intravenous contrast.  CONTRAST: 158mL ISOVUE-300 IOPAMIDOL (ISOVUE-300) INJECTION 61%  COMPARISON: Chest CTA 09/07/2016. CT Pelvis 11/22/2009  FINDINGS: Lower chest: Stable lung bases since 2018. No cardiomegaly or pericardial effusion. Calcified aortic atherosclerosis. No pleural effusion. Minor lung base atelectasis and/or scarring.  Hepatobiliary: Negative liver and gallbladder.  Pancreas: Punctate parenchymal calcifications scattered throughout the entire pancreas. No significant pancreatic atrophy. No ductal dilatation. No inflammation.  Spleen: Negative.  Adrenals/Urinary Tract: Normal adrenal glands. Bilateral renal enhancement and contrast excretion is symmetric and within normal limits. There are small benign bilateral renal parapelvic cysts. Occasional small benign renal cortical cyst. Unremarkable course of both ureters; the distal right ureter is obscured by streak artifact in the pelvis.  Some of the urinary bladder is obscured by streak artifact. The visible bladder is unremarkable.  Stomach/Bowel: Negative rectum and sigmoid colon aside from redundancy and some retained stool. Decompressed and negative left colon. Mildly redundant but otherwise negative transverse colon. Mild retained stool in the right colon. Normal terminal ileum. Diminutive or absent appendix. No pericecal inflammation.  No dilated or abnormal small bowel loops are identified. The stomach is decompressed. The duodenum is unremarkable.  No abdominal free air or free fluid.  Vascular/Lymphatic: Aortoiliac calcified atherosclerosis.  Major arterial structures in the abdomen and pelvis remain patent. There is up to moderate stenosis of the distal abdominal aorta (series 3, image 36).  The portal venous system appears patent.  No lymphadenopathy.  Reproductive: Within normal limits.  Other: Streak artifact through the pelvis from the right hip hardware. No pelvic free fluid is evident.  Musculoskeletal: Chronic degenerative changes in the spine. Previous right hip arthroplasty. No acute osseous abnormality identified.  IMPRESSION: 1. No acute or inflammatory process identified in the Abdomen or pelvis. 2. Aortic Atherosclerosis (ICD10-I70.0) with up to moderate stenosis of the distal abdominal aorta suspected. 3. Chronic calcific pancreatitis. No active inflammation.  Genevie Ann M.D.  Summary/Assessment: In the setting of anemia and thrombocytopenia of unclear etiology, she presents now for the results of her preliminary laboratory evaluation with recommendations.  She has been on ferrous sulfate once daily by mouth, but is apparently not taking it over an indefinite period.  It is unclear why she stopped taking ferrous sulfate.  There was no apparent gastrointestinal adverse reaction.  By report, a right modified radical mastectomy was performed without further endocrine or cytotoxic chemotherapy.    Further details could not be recalled. Her last screening mammogram on the left was in 2018, reportedly normal.  Her memory is exceedingly poor for both distant and recent details.  Her daughter Leah Wiggins was the primary informant. Although she has gastroesophageal reflux disease, controlled with medication, she has no peptic ulcer disease. There is no viral hepatitis, inflammatory bowel disease, or symptomatic diverticulosis.  She cannot recall her last colonoscopy.    She has been followed by Dr. Mar Daring  Mann, gastroenterology, in the past.  She has chronic pain in her feet which she attributes to "arthritis."   November 02, 2017 she presented to the emergency department at [facility] complaining of dyspnea and abdominal pain.  A cardiac evaluation revealed no EKG changes or elevated troponin levels.  No frank bleeding was identified on rectal examination.  A complete blood count on August 29 showed hemoglobin 8.2 hematocrit 27.2 MCV 67.7 MCH 20.5 RDW 21.0 WBC 8.2; platelets 130,000.  A comprehensive metabolic panel was normal except for mildly elevated serum glucose and a mildly decreased calcium level.  Although she was found to be anemic, she was discharged home in stable condition with instructions to follow-up with her primary care provider.  At the time of her initial visit, we discussed in lengthy detail her complex medical history, prior laboratory studies, absence of primary documentation regarding her prior breast cancer and treatment, imaging studies, and importance and methodology for evaluating anemia and thrombocytopenia.  A battery of laboratory studies were obtained to exclude an underlying iron deficiency, nutritional deficit, paraproteinemia, hemolytic process, metabolic anomaly, copper deficiency, inflammatory process, or hemoglobinopathy.  It was strongly recommended that she consider discontinuing completely cigarette smoking.  She should discuss either pharmacologic and/or behavioral methods through her primary care physician.  Additionally, she was encouraged also to stay up-to-date through her primary care provider on all of her immunizations including the seasonal influenza vaccine and the pneumococcal vaccines (2).    In the interim since she reports no new problems or complaints. Her overall energy level is fair.  She denies any significant appetite or weight deficit.    She is exceedingly hard of hearing.  She has chronic dizziness, lightheadedness, and near syncope both with positional changes and walking.  She has no headache more syncopal episode.  She has no rash or itching.  There are no new visual changes.  Her vision however is poor. She has not been to the dentist for many years. She has a dry cough with occasional clear productive sputum.  There is no sore throat or orthopnea.  She denies any dyspnea at rest or on minimal exertion.  She ambulates with the use of a cane.  There is no pain or difficulty in swallowing.  No fever, shaking chills, sweats, or flulike symptoms are evident.  She has no heartburn or indigestion. There is no nausea, vomiting, diarrhea, or constipation.  She denies melena or bright red blood per rectum.  She reports no urinary frequency, urgency, hematuria, or dysuria.  Although she has mild swelling in her ankles at the end of the day, that swelling recedes overnight.  With the exception of pain in her right shoulder and feet, she denies any pain syndrome.  On an intermittent basis she has numbness and tingling in her fingers and toes.  Her other comorbid problems include non-insulin-requiring diabetes mellitus; primary hypertension; elevated BMI; bilateral age-related hearing deficit; gastroesophageal reflux disease; osteoporosis; chronic pancreatitis; degenerative joint disease involving the right shoulder and feet; dyslipidemia; obstructive sleep apnea noncompliant with CPAP; peripheral arterial disease; active tobacco dependence; previous atopic dermatitis; chronic kidney injury; restless leg syndrome; and breast cancer identified initially in 1991.   Recommendation/Plan: We discussed in detail the results of her laboratory studies from October 8.  There is no evidence of Y40 or folic acid deficiency, paraproteinemia, malabsorption syndrome, antiphospholipid antibody syndrome, hemolytic process, copper deficiency, inflammatory process, or hemoglobinopathy.  Her iron studies however confirm iron deficiency anemia.  Those results  are detailed above.  Iron deficiency alone does not account for thrombocytopenia. As long as her platelets are above  100,000, I would attribute this to an immune related phenomenon.  No specific intervention is necessary with platelets greater than 100,000.  We discussed in detail the relative risk versus benefits of iron either by mouth or parenterally.  She has apparently tolerated oral iron in the past.  Although somewhat slower in correcting her underlying anemia as well as symptoms, she chose to be restarted on ferrous sulfate: 325 mg once daily; with instructions to increase ferrous sulfate: 325 mg twice daily with food if tolerated; finally ferrous sulfate: 325 mg 3 times daily with food.  She was advised not to increase the ferrous sulfate if she has gastrointestinal side effects, but to call our office if gastrointestinal toxicity occurs.  In the setting of iron deficiency anemia, until proven otherwise, the source of iron loss is bleeding from the gastrointestinal tract.  For that reason, endoscopic evaluation to include both colonoscopy, and if necessary, esophagogastroduodenoscopy, as deemed appropriate.  Those arrangements should be orchestrated by Dr. Baird Cancer.  A copy of our report is being forwarded to Dr. Collene Mares.  It was strongly recommended that she consider discontinuing completely cigarette smoking.  She should discuss pharmacologic and/or behavioral methods through her primary care physician, Dr. Baird Cancer.  She was encouraged also to stay up-to-date through her primary care office on all of her immunizations including the seasonal influenza vaccine and the pneumococcal vaccines (2).  The total time spent discussing the previous laboratory studies, confirming both anemia and thrombocytopenia, role, rationale, expectation, and mechanics of iron replacement with ferrous sulfate, relative risks versus benefits of oral versus parenteral iron, and recommendation for gastrointestinal evaluation was 40 minutes.  At least 50% of that time was spent in face-to-face discussion, counseling, and answering  questions.  All questions were answered to her satisfaction. .  This note was dictated using voice activated technology/software.  Unfortunately, typographical errors are not uncommon, and transcription is subject to mistakes and regrettably misinterpretation.  If necessary, clarification of the above information can be discussed with me at any time.  FOLLOW UP: AS DIRECTED   cc:     Glendale Chard, MD           Juanita Craver, MD   Henreitta Leber, MD  Hematology/Oncology [place] [address] [place], [place] [address] Office: [phone] [facility]: [phone]

## 2017-12-25 NOTE — Telephone Encounter (Signed)
Scheduled appt per 10/21 los - gave patient aVS and calender per los.

## 2017-12-25 NOTE — Patient Instructions (Signed)
We reviewed in detail the results of your extensive work-up and evaluation.  It is clear you are anemic and iron deficient.  There is no evidence of premature destruction of red blood cells, abnormal protein in the blood, abnormal hemoglobin production, or nutritional deficiency.    Although given the option of intravenous versus oral iron replacement, you declined intravenous iron in favor of oral iron replacement with ferrous sulfate.  A prescription for ferrous sulfate: 325 mg (60 mg elemental iron) should be taken once daily with food.  If tolerated after 3 days, you can increase ferrous sulfate: 325 mg twice daily with food; after 3 days if tolerated take ferrous sulfate: 325 mg 3 times daily.  Ferrous sulfate is a nonprescription item.  In your age group, unless proven otherwise, your source of iron deficiency is gastrointestinal bleeding.  For that reason it was very strongly recommended that a gastrointestinal evaluation be obtained.  That procedure (s) should be arranged through the office of Dr. Baird Cancer.  Please contact her office for coordinating those details.  Barring any unforeseen complications, your next scheduled doctor visit with laboratory studies before hand is on November 18.  Please do not hesitate to call us in the interim should any new or untoward problems arise.  Thank you! Ladona Ridgel, MD Hematology/Oncology

## 2017-12-26 DIAGNOSIS — Z1231 Encounter for screening mammogram for malignant neoplasm of breast: Secondary | ICD-10-CM | POA: Diagnosis not present

## 2017-12-26 DIAGNOSIS — Z853 Personal history of malignant neoplasm of breast: Secondary | ICD-10-CM | POA: Diagnosis not present

## 2017-12-26 LAB — HM MAMMOGRAPHY: HM Mammogram: NORMAL (ref 0–4)

## 2018-01-04 ENCOUNTER — Other Ambulatory Visit: Payer: Self-pay | Admitting: Gastroenterology

## 2018-01-04 DIAGNOSIS — K5520 Angiodysplasia of colon without hemorrhage: Secondary | ICD-10-CM | POA: Diagnosis not present

## 2018-01-04 DIAGNOSIS — D509 Iron deficiency anemia, unspecified: Secondary | ICD-10-CM | POA: Diagnosis not present

## 2018-01-04 DIAGNOSIS — R195 Other fecal abnormalities: Secondary | ICD-10-CM | POA: Diagnosis not present

## 2018-01-04 DIAGNOSIS — Z1211 Encounter for screening for malignant neoplasm of colon: Secondary | ICD-10-CM | POA: Diagnosis not present

## 2018-01-08 ENCOUNTER — Encounter: Payer: Self-pay | Admitting: Internal Medicine

## 2018-01-11 DIAGNOSIS — J449 Chronic obstructive pulmonary disease, unspecified: Secondary | ICD-10-CM | POA: Diagnosis not present

## 2018-01-12 DIAGNOSIS — J449 Chronic obstructive pulmonary disease, unspecified: Secondary | ICD-10-CM | POA: Diagnosis not present

## 2018-01-19 ENCOUNTER — Encounter (HOSPITAL_COMMUNITY): Payer: Self-pay | Admitting: Emergency Medicine

## 2018-01-19 ENCOUNTER — Encounter (HOSPITAL_COMMUNITY)
Admission: RE | Disposition: A | Discharge status: Home / Self Care | Payer: Self-pay | Source: Ambulatory Visit | Attending: Gastroenterology

## 2018-01-19 ENCOUNTER — Ambulatory Visit (HOSPITAL_COMMUNITY)
Admission: RE | Admit: 2018-01-19 | Discharge: 2018-01-19 | Disposition: A | Discharge status: Home / Self Care | Payer: Medicare Other | Source: Ambulatory Visit | Attending: Gastroenterology | Admitting: Gastroenterology

## 2018-01-19 ENCOUNTER — Ambulatory Visit (HOSPITAL_COMMUNITY): Payer: Medicare Other | Admitting: Registered Nurse

## 2018-01-19 ENCOUNTER — Other Ambulatory Visit: Payer: Self-pay

## 2018-01-19 DIAGNOSIS — Z96641 Presence of right artificial hip joint: Secondary | ICD-10-CM | POA: Diagnosis not present

## 2018-01-19 DIAGNOSIS — Z8719 Personal history of other diseases of the digestive system: Secondary | ICD-10-CM | POA: Insufficient documentation

## 2018-01-19 DIAGNOSIS — K921 Melena: Secondary | ICD-10-CM | POA: Insufficient documentation

## 2018-01-19 DIAGNOSIS — Z853 Personal history of malignant neoplasm of breast: Secondary | ICD-10-CM | POA: Diagnosis not present

## 2018-01-19 DIAGNOSIS — F17210 Nicotine dependence, cigarettes, uncomplicated: Secondary | ICD-10-CM | POA: Insufficient documentation

## 2018-01-19 DIAGNOSIS — Z6829 Body mass index (BMI) 29.0-29.9, adult: Secondary | ICD-10-CM | POA: Insufficient documentation

## 2018-01-19 DIAGNOSIS — D509 Iron deficiency anemia, unspecified: Secondary | ICD-10-CM | POA: Diagnosis not present

## 2018-01-19 DIAGNOSIS — I10 Essential (primary) hypertension: Secondary | ICD-10-CM | POA: Insufficient documentation

## 2018-01-19 DIAGNOSIS — K2950 Unspecified chronic gastritis without bleeding: Secondary | ICD-10-CM | POA: Insufficient documentation

## 2018-01-19 DIAGNOSIS — K6389 Other specified diseases of intestine: Secondary | ICD-10-CM | POA: Insufficient documentation

## 2018-01-19 DIAGNOSIS — E785 Hyperlipidemia, unspecified: Secondary | ICD-10-CM | POA: Diagnosis not present

## 2018-01-19 DIAGNOSIS — K219 Gastro-esophageal reflux disease without esophagitis: Secondary | ICD-10-CM | POA: Insufficient documentation

## 2018-01-19 DIAGNOSIS — E669 Obesity, unspecified: Secondary | ICD-10-CM | POA: Insufficient documentation

## 2018-01-19 DIAGNOSIS — E1151 Type 2 diabetes mellitus with diabetic peripheral angiopathy without gangrene: Secondary | ICD-10-CM | POA: Diagnosis not present

## 2018-01-19 DIAGNOSIS — G4730 Sleep apnea, unspecified: Secondary | ICD-10-CM | POA: Diagnosis not present

## 2018-01-19 DIAGNOSIS — Z79899 Other long term (current) drug therapy: Secondary | ICD-10-CM | POA: Diagnosis not present

## 2018-01-19 DIAGNOSIS — G4733 Obstructive sleep apnea (adult) (pediatric): Secondary | ICD-10-CM | POA: Insufficient documentation

## 2018-01-19 DIAGNOSIS — J449 Chronic obstructive pulmonary disease, unspecified: Secondary | ICD-10-CM | POA: Diagnosis not present

## 2018-01-19 DIAGNOSIS — K317 Polyp of stomach and duodenum: Secondary | ICD-10-CM | POA: Diagnosis not present

## 2018-01-19 HISTORY — PX: COLONOSCOPY WITH PROPOFOL: SHX5780

## 2018-01-19 HISTORY — PX: BIOPSY: SHX5522

## 2018-01-19 HISTORY — PX: ESOPHAGOGASTRODUODENOSCOPY (EGD) WITH PROPOFOL: SHX5813

## 2018-01-19 LAB — GLUCOSE, CAPILLARY: Glucose-Capillary: 102 mg/dL — ABNORMAL HIGH (ref 70–99)

## 2018-01-19 SURGERY — ESOPHAGOGASTRODUODENOSCOPY (EGD) WITH PROPOFOL
Anesthesia: Monitor Anesthesia Care

## 2018-01-19 MED ORDER — SODIUM CHLORIDE 0.9 % IV SOLN: INTRAVENOUS | Status: DC

## 2018-01-19 MED ORDER — LACTATED RINGERS IV SOLN
INTRAVENOUS | Status: DC
  Administered 2018-01-19: 09:00:00 via INTRAVENOUS

## 2018-01-19 MED ORDER — PROPOFOL 10 MG/ML IV BOLUS
INTRAVENOUS | Status: AC
  Filled 2018-01-19: qty 60

## 2018-01-19 MED ORDER — PROPOFOL 500 MG/50ML IV EMUL
INTRAVENOUS | Status: DC | PRN
  Administered 2018-01-19: 120 ug/kg/min via INTRAVENOUS

## 2018-01-19 MED ORDER — PROPOFOL 10 MG/ML IV BOLUS
INTRAVENOUS | Status: DC | PRN
  Administered 2018-01-19: 20 mg via INTRAVENOUS
  Administered 2018-01-19 (×2): 10 mg via INTRAVENOUS

## 2018-01-19 SURGICAL SUPPLY — 24 items

## 2018-01-19 NOTE — Op Note (Signed)
[facility] Patient Name: Leah Wiggins Procedure Date: 01/19/2018 MRN: [id number] Attending MD: Carol Ada , MD Date of Birth: [birth year] CSN: [id number] Age: 84 Admit Type: Outpatient Procedure:                Upper GI endoscopy Indications:              Iron deficiency anemia Providers:                Carol Ada, MD, Angus Seller, Charolette Child,                            Technician, Courtney Heys [place], CRNA Referring MD:              Medicines:                Propofol per Anesthesia Complications:            No immediate complications. Estimated Blood Loss:     Estimated blood loss was minimal. Procedure:                Pre-Anesthesia Assessment:                           - Prior to the procedure, a History and Physical                            was performed, and patient medications and                            allergies were reviewed. The patient's tolerance of                            previous anesthesia was also reviewed. The risks                            and benefits of the procedure and the sedation                            options and risks were discussed with the patient.                            All questions were answered, and informed consent                            was obtained. Prior Anticoagulants: The patient has                            taken no previous anticoagulant or antiplatelet                            agents. ASA Grade Assessment: III - A patient with                            severe systemic disease. After reviewing the risks  and benefits, the patient was deemed in                            satisfactory condition to undergo the procedure.                           - Sedation was administered by an anesthesia                            professional. Deep sedation was attained.                           After obtaining informed consent, the endoscope was   passed under direct vision. Throughout the                            procedure, the patient's blood pressure, pulse, and                            oxygen saturations were monitored continuously. The                            PCF-H190DL ([id number]) Olympus peds colonoscope was                            introduced through the mouth, and advanced to the                            second part of duodenum. The upper GI endoscopy was                            accomplished without difficulty. The patient                            tolerated the procedure well. Scope In: Scope Out: Findings:      The esophagus was normal.      A single 8 mm semi-sessile polyp with no bleeding and no stigmata of       recent bleeding was found in the gastric body. Biopsies were taken with       a cold forceps for histology.      The examined duodenum was normal.      The scope was advanced to the fourth portion of the duodenum. There was       no evidence of any inflammation, ulcerations, erosions, or vascular       abnormalities, however, an umbilicated lesion was noted in the distal       gastric body. It was not clear if this was a true lesion versus a       pancreatic rest, exhitbiting irritation. Impression:               - Normal esophagus.                           - A single gastric polyp. Biopsied.                           -  Normal examined duodenum. Moderate Sedation:      Not Applicable - Patient had care per Anesthesia. Recommendation:           - Patient has a contact number available for                            emergencies. The signs and symptoms of potential                            delayed complications were discussed with the                            patient. Return to normal activities tomorrow.                            Written discharge instructions were provided to the                            patient.                           - Resume previous diet.                            - Continue present medications.                           - Await pathology results.                           - Schedule a capsule endoscopy. Procedure Code(s):        --- Professional ---                           [phone], Esophagogastroduodenoscopy, flexible,                            transoral; with biopsy, single or multiple Diagnosis Code(s):        --- Professional ---                           K31.7, Polyp of stomach and duodenum                           D50.9, Iron deficiency anemia, unspecified CPT copyright 2018 American Medical Association. All rights reserved. The codes documented in this report are preliminary and upon coder review may  be revised to meet current compliance requirements. Carol Ada, MD Carol Ada, MD 01/19/2018 9:46:33 AM This report has been signed electronically. Number of Addenda: 0

## 2018-01-19 NOTE — H&P (Signed)
[name] HPI: This 84 year old black female presents to the office for a follow up. She had a recent rectal exam done with the [facility] which was reportedly positive. She is accompanied by her daughter today. She was found to have anemia when labs were done on 12/12/2017 with a hemoglobin of 8.6 gm/dl, an Iron of 17 and a Ferritin of 10. She is currently taking Ferrous Sulfate which was just increased to a TID schedule. She is dizzy and weak. She is short of breath but has COPD. She has 1 BM per day. She has seen blood on the toilet tissue at times. She has pelvic pain which started which started about 6 months ago. She is currently taking Pantoprazole for acid reflux. She has good appetite and her weight has been stable. She denies having any complaints of nausea, vomiting, dysphagia or odynophagia. She denies having a family history of colon cancer, celiac sprue or IBD. 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   Past Medical History:  Diagnosis Date  . Arthritis    "shoulders" (05/04/2017)  . Breast cancer, right breast ([place]) 1991  . GERD (gastroesophageal reflux disease)   . Hyperlipidemia   . Hypertension   . Non-stress test nonreactive, 04/24/12, normal 12/10/2012  . OSA on CPAP    "don't wear it all the time" (05/04/2017)  . PVD (peripheral vascular disease) ([place])   . Tobacco use 12/10/2012  . Type II diabetes mellitus ([place])     Past Surgical History:  Procedure Laterality Date  . APPENDECTOMY    . BREAST BIOPSY Right 1991  . CARDIOVASCULAR STRESS TEST  04/24/2012   No significant ST segment change suggestive of ischemia.  Marland Kitchen CATARACT EXTRACTION, BILATERAL Bilateral   . JOINT REPLACEMENT    . LOWER EXTREMITY  ARTERIAL DOPPLER Bilateral 05/07/2012   Left ABI-demonstrated mild arterial insufficiency. Right CIA 50-69% diameter reduction. Right SFA less than 50% diameter reduction. Left SFA 70-99% diameter reduction. Bilateral Runoff-Posterior tibial arteries appeared occluded.  Marland Kitchen MASTECTOMY PARTIAL / LUMPECTOMY W/ AXILLARY LYMPHADENECTOMY Right 1991   . TOTAL HIP ARTHROPLASTY Right 2011    Family History  Problem Relation Age of Onset  . Heart disease Mother   . Stroke Mother   . Stroke Father   . Cancer Sister        GIST  . Cancer Brother        thyroid   . Diabetes Son   . Stroke Son     Social History:  reports that she has been smoking cigarettes. She started smoking about 70 years ago. She has a 17.50 pack-year smoking history. She has never used smokeless tobacco. She reports that she does not drink alcohol or use drugs.  Allergies:  Allergies  Allergen Reactions  . Bactrim [Sulfamethoxazole-Trimethoprim] Swelling    Marked angioedema requiring hospitalization  . Penicillins Other (See Comments)    Has patient had a PCN reaction causing immediate rash, facial/tongue/throat swelling, SOB or lightheadedness with hypotension: Unk Has patient had a PCN reaction causing severe rash involving mucus membranes or skin necrosis: Unk Has patient had a PCN reaction that required hospitalization: Unk Has patient had a PCN reaction occurring within the last 10 years: No If all of the above answers are "NO", then may  proceed with Cephalosporin use.    Medications:  Scheduled:  Continuous: . sodium chloride      No results found for this or any previous visit (from the past 24 hour(s)).   No results found.  ROS:  As stated above in the HPI otherwise negative.  There were no vitals taken for this visit.    PE: Gen: NAD, Alert and Oriented HEENT:  [place]/AT, EOMI Neck: Supple, no LAD Lungs: CTA Bilaterally CV: RRR without M/G/R ABM: Soft, NTND, +BS Ext: No  C/C/E  Assessment/Plan: 1) IDA - EGD/Colonoscopy.  Jasher Barkan D 01/19/2018, 7:58 AM

## 2018-01-19 NOTE — Anesthesia Postprocedure Evaluation (Signed)
Anesthesia Post Note  Patient: Leah Wiggins  Procedure(s) Performed: ESOPHAGOGASTRODUODENOSCOPY (EGD) WITH PROPOFOL (N/A ) COLONOSCOPY WITH PROPOFOL (N/A ) BIOPSY     Patient location during evaluation: Endoscopy Anesthesia Type: MAC Level of consciousness: oriented, awake and alert and patient cooperative Pain management: pain level controlled Vital Signs Assessment: post-procedure vital signs reviewed and stable Respiratory status: spontaneous breathing, nonlabored ventilation and respiratory function stable Cardiovascular status: blood pressure returned to baseline and stable Postop Assessment: no apparent nausea or vomiting Anesthetic complications: no    Last Vitals:  Vitals:   01/19/18 0950 01/19/18 1000  BP: 140/68 (!) 152/62  Pulse: 72 66  Resp: (!) 21 (!) 24  Temp:    SpO2: 99% 99%    Last Pain:  Vitals:   01/19/18 1000  TempSrc:   PainSc: 0-No pain                 Reggie Bise,E. Alysen Smylie

## 2018-01-19 NOTE — Op Note (Signed)
[facility] Patient Name: Leah Wiggins Procedure Date: 01/19/2018 MRN: [id number] Attending MD: Carol Ada , MD Date of Birth: 02/13/1927 CSN: [id number] Age: 84 Admit Type: Outpatient Procedure:                Colonoscopy Indications:              Hematochezia, Heme positive stool Providers:                Carol Ada, MD, Angus Seller, Charolette Child,                            Technician, Courtney Heys [place], CRNA Referring MD:              Medicines:                Propofol per Anesthesia Complications:            No immediate complications. Estimated Blood Loss:     Estimated blood loss: none. Procedure:                Pre-Anesthesia Assessment:                           - Prior to the procedure, a History and Physical                            was performed, and patient medications and                            allergies were reviewed. The patient's tolerance of                            previous anesthesia was also reviewed. The risks                            and benefits of the procedure and the sedation                            options and risks were discussed with the patient.                            All questions were answered, and informed consent                            was obtained. Prior Anticoagulants: The patient has                            taken no previous anticoagulant or antiplatelet                            agents. ASA Grade Assessment: III - A patient with                            severe systemic disease. After reviewing the risks  and benefits, the patient was deemed in                            satisfactory condition to undergo the procedure.                           - Sedation was administered by an anesthesia                            professional. Deep sedation was attained.                           After obtaining informed consent, the colonoscope                            was  passed under direct vision. Throughout the                            procedure, the patient's blood pressure, pulse, and                            oxygen saturations were monitored continuously. The                            PCF-H190DL ([id number]) Olympus peds colonoscope was                            introduced through the anus and advanced to the the                            terminal ileum. The colonoscopy was performed                            without difficulty. The patient tolerated the                            procedure well. The quality of the bowel                            preparation was good. The terminal ileum, ileocecal                            valve, appendiceal orifice, and rectum were                            photographed. Scope In: 9:17:35 AM Scope Out: 9:32:27 AM Scope Withdrawal Time: 0 hours 10 minutes 36 seconds  Total Procedure Duration: 0 hours 14 minutes 52 seconds  Findings:      The entire examined colon appeared normal. The bleeding is most likely a       hemorrhodal source. Impression:               - The entire examined colon is normal.                           -  No specimens collected. Moderate Sedation:      Not Applicable - Patient had care per Anesthesia. Recommendation:           - Patient has a contact number available for                            emergencies. The signs and symptoms of potential                            delayed complications were discussed with the                            patient. Return to normal activities tomorrow.                            Written discharge instructions were provided to the                            patient.                           - Resume previous diet.                           - Continue present medications.                           - Repeat colonoscopy is not recommended for                            surveillance. Procedure Code(s):        --- Professional ---                            [phone], Colonoscopy, flexible; diagnostic, including                            collection of specimen(s) by brushing or washing,                            when performed (separate procedure) Diagnosis Code(s):        --- Professional ---                           K92.1, Melena (includes Hematochezia)                           R19.5, Other fecal abnormalities CPT copyright 2018 American Medical Association. All rights reserved. The codes documented in this report are preliminary and upon coder review may  be revised to meet current compliance requirements. Carol Ada, MD Carol Ada, MD 01/19/2018 9:40:02 AM This report has been signed electronically. Number of Addenda: 0

## 2018-01-19 NOTE — Transfer of Care (Signed)
Immediate Anesthesia Transfer of Care Note  Patient: Leah Wiggins  Procedure(s) Performed: ESOPHAGOGASTRODUODENOSCOPY (EGD) WITH PROPOFOL (N/A ) COLONOSCOPY WITH PROPOFOL (N/A ) BIOPSY  Patient Location: PACU  Anesthesia Type:MAC  Level of Consciousness: awake, alert  and oriented  Airway & Oxygen Therapy: Patient Spontanous Breathing and Patient connected to face mask oxygen  Post-op Assessment: Report given to RN and Post -op Vital signs reviewed and stable  Post vital signs: Reviewed and stable  Last Vitals:  Vitals Value Taken Time  BP    Temp    Pulse    Resp    SpO2      Last Pain:  Vitals:   01/19/18 0818  TempSrc: Oral         Complications: No apparent anesthesia complications

## 2018-01-19 NOTE — Anesthesia Preprocedure Evaluation (Addendum)
Anesthesia Evaluation  Patient identified by MRN, date of birth, ID band Patient awake    Reviewed: Allergy & Precautions, NPO status , Patient's Chart, lab work & pertinent test results  History of Anesthesia Complications Negative for: history of anesthetic complications  Airway Mallampati: II  TM Distance: >3 FB Neck ROM: Full    Dental  (+) Edentulous Upper, Missing, Poor Dentition, Dental Advisory Given   Pulmonary sleep apnea and Continuous Positive Airway Pressure Ventilation , COPD,  COPD inhaler, Current Smoker,    breath sounds clear to auscultation       Cardiovascular hypertension, (-) angina Rhythm:Regular Rate:Normal  '18 ECHO: EF 65-70%, valves OK   Neuro/Psych negative neurological ROS     GI/Hepatic Neg liver ROS, GERD  Medicated,  Endo/Other  diabetes (glu 102), Oral Hypoglycemic Agentsobesity  Renal/GU negative Renal ROS     Musculoskeletal   Abdominal (+) + obese,   Peds  Hematology  (+) anemia ,   Anesthesia Other Findings Breast cancer  Reproductive/Obstetrics                            Anesthesia Physical Anesthesia Plan  ASA: III  Anesthesia Plan: MAC   Post-op Pain Management:    Induction:   PONV Risk Score and Plan: 1 and Treatment may vary due to age or medical condition  Airway Management Planned: Natural Airway and Nasal Cannula  Additional Equipment:   Intra-op Plan:   Post-operative Plan:   Informed Consent: I have reviewed the patients History and Physical, chart, labs and discussed the procedure including the risks, benefits and alternatives for the proposed anesthesia with the patient or authorized representative who has indicated his/her understanding and acceptance.   Dental advisory given  Plan Discussed with: CRNA and Surgeon  Anesthesia Plan Comments: (Plan routine monitors, MAC)        Anesthesia Quick Evaluation

## 2018-01-19 NOTE — Discharge Instructions (Signed)

## 2018-01-22 ENCOUNTER — Inpatient Hospital Stay: Payer: Medicare Other | Attending: Hematology and Oncology | Admitting: Hematology and Oncology

## 2018-01-22 ENCOUNTER — Telehealth: Payer: Self-pay | Admitting: Hematology and Oncology

## 2018-01-22 ENCOUNTER — Encounter: Payer: Self-pay | Admitting: Hematology and Oncology

## 2018-01-22 ENCOUNTER — Inpatient Hospital Stay: Payer: Medicare Other

## 2018-01-22 VITALS — BP 125/92 | HR 72 | Temp 98.2°F | Resp 18 | Ht 63.0 in | Wt 163.9 lb

## 2018-01-22 DIAGNOSIS — E1122 Type 2 diabetes mellitus with diabetic chronic kidney disease: Secondary | ICD-10-CM | POA: Diagnosis not present

## 2018-01-22 DIAGNOSIS — D500 Iron deficiency anemia secondary to blood loss (chronic): Secondary | ICD-10-CM

## 2018-01-22 DIAGNOSIS — Z9011 Acquired absence of right breast and nipple: Secondary | ICD-10-CM | POA: Diagnosis not present

## 2018-01-22 DIAGNOSIS — N189 Chronic kidney disease, unspecified: Secondary | ICD-10-CM

## 2018-01-22 DIAGNOSIS — Z853 Personal history of malignant neoplasm of breast: Secondary | ICD-10-CM | POA: Diagnosis not present

## 2018-01-22 DIAGNOSIS — D509 Iron deficiency anemia, unspecified: Secondary | ICD-10-CM | POA: Diagnosis not present

## 2018-01-22 DIAGNOSIS — D696 Thrombocytopenia, unspecified: Secondary | ICD-10-CM

## 2018-01-22 DIAGNOSIS — F17210 Nicotine dependence, cigarettes, uncomplicated: Secondary | ICD-10-CM

## 2018-01-22 DIAGNOSIS — I129 Hypertensive chronic kidney disease with stage 1 through stage 4 chronic kidney disease, or unspecified chronic kidney disease: Secondary | ICD-10-CM | POA: Insufficient documentation

## 2018-01-22 LAB — CBC WITH DIFFERENTIAL (CANCER CENTER ONLY)
Abs Immature Granulocytes: 0.01 10*3/uL (ref 0.00–0.07)
Basophils Absolute: 0 10*3/uL (ref 0.0–0.1)
Basophils Relative: 1 %
Eosinophils Absolute: 0.1 10*3/uL (ref 0.0–0.5)
Eosinophils Relative: 1 %
HCT: 33.6 % — ABNORMAL LOW (ref 36.0–46.0)
Hemoglobin: 10.3 g/dL — ABNORMAL LOW (ref 12.0–15.0)
Immature Granulocytes: 0 %
Lymphocytes Relative: 22 %
Lymphs Abs: 0.9 10*3/uL (ref 0.7–4.0)
MCH: 21.3 pg — ABNORMAL LOW (ref 26.0–34.0)
MCHC: 30.7 g/dL (ref 30.0–36.0)
MCV: 69.4 fL — ABNORMAL LOW (ref 80.0–100.0)
Monocytes Absolute: 0.4 10*3/uL (ref 0.1–1.0)
Monocytes Relative: 9 %
Neutro Abs: 2.7 10*3/uL (ref 1.7–7.7)
Neutrophils Relative %: 67 %
Platelet Count: 133 10*3/uL — ABNORMAL LOW (ref 150–400)
RBC: 4.84 MIL/uL (ref 3.87–5.11)
RDW: 30.3 % — ABNORMAL HIGH (ref 11.5–15.5)
WBC Count: 4 10*3/uL (ref 4.0–10.5)
nRBC: 0 % (ref 0.0–0.2)

## 2018-01-22 LAB — COMPREHENSIVE METABOLIC PANEL
ALT: 10 U/L (ref 0–44)
AST: 17 U/L (ref 15–41)
Albumin: 3.5 g/dL (ref 3.5–5.0)
Alkaline Phosphatase: 73 U/L (ref 38–126)
Anion gap: 7 (ref 5–15)
BUN: 15 mg/dL (ref 8–23)
CO2: 27 mmol/L (ref 22–32)
Calcium: 9 mg/dL (ref 8.9–10.3)
Chloride: 111 mmol/L (ref 98–111)
Creatinine, Ser: 0.98 mg/dL (ref 0.44–1.00)
GFR calc Af Amer: 58 mL/min — ABNORMAL LOW (ref >60)
GFR calc non Af Amer: 50 mL/min — ABNORMAL LOW (ref >60)
Glucose, Bld: 100 mg/dL — ABNORMAL HIGH (ref 70–99)
Potassium: 4.2 mmol/L (ref 3.5–5.1)
Sodium: 145 mmol/L (ref 135–145)
Total Bilirubin: 0.4 mg/dL (ref 0.3–1.2)
Total Protein: 6.8 g/dL (ref 6.5–8.1)

## 2018-01-22 LAB — RETIC PANEL
Immature Retic Fract: 12.7 % (ref 2.3–15.9)
RBC.: 4.84 MIL/uL (ref 3.87–5.11)
Retic Count, Absolute: 38.7 10*3/uL (ref 19.0–186.0)
Retic Ct Pct: 0.8 % (ref 0.4–3.1)
Reticulocyte Hemoglobin: 26.7 pg — ABNORMAL LOW (ref >27.9)

## 2018-01-22 LAB — FERRITIN: Ferritin: 24 ng/mL (ref 11–307)

## 2018-01-22 NOTE — Patient Instructions (Signed)
We discussed the results of your laboratory studies from today.  You were given copies for your review.  Both your hemoglobin and platelet count are increasing with ferrous sulfate.  Continue ferrous sulfate: 325 mg 3 times daily.  If constipation or diarrhea develop, call us.  A follow-up visit with blood work has been scheduled on December 16.  Please do not hesitate to call should any new or untoward problems arise in the interim.  Happy Thanksgiving! Ladona Ridgel, MD Hematology/Oncology

## 2018-01-22 NOTE — Addendum Note (Signed)
Addendum  created 01/22/18 2297 by Lollie Sails, CRNA   Charge Capture section accepted

## 2018-01-22 NOTE — Progress Notes (Signed)
Hematology/Oncology Outpatient Progress Note  Patient Name:  Leah Wiggins  DOB: 1926-05-30   Date of Service: January 22, 2018  Referring Provider: Glendale Chard, MD [address] [place], [place] [address]   Consulting Physician: Henreitta Leber, MD Hematology/Oncology  Reason for Visit: In the setting of iron deficiency anemia and improving thrombocytopenia, she presents now following upper and lower endoscopic evaluation; continuing on ferrous sulfate, for the results of her most recent laboratory evaluation with recommendations.  Brief History: Leah Wiggins is a 84 year old resident of [place] whose past medical history is significant for non-insulin-requiring diabetes mellitus; primary hypertension; elevated BMI; bilateral age-related hearing deficit; gastroesophageal reflux disease; osteoporosis; chronic pancreatitis; degenerative joint disease involving the right shoulder and feet; dyslipidemia; obstructive sleep apnea noncompliant with CPAP; peripheral arterial disease; active tobacco dependence; previous atopic dermatitis; chronic kidney injury; restless leg syndrome; and breast cancer identified initially in 1991.  She has been on ferrous sulfate once daily by mouth over unknown period.  She discontinued it on her own.  Although those records are not available for review, by report, a right modified radical mastectomy was performed without further endocrine or cytotoxic chemotherapy. She cannot recall any further details. Her last screening mammogram on the left was in 2018, reportedly normal.  Her primary care physician is Dr. Glendale Chard.  She is accompanied by her attentive daughter [name].  Her memory is exceedingly poor for both distant and recent details.  Her daughter [name] was the primary informant. Although she has gastroesophageal reflux disease, controlled with medication, she has no peptic ulcer disease. There is no viral hepatitis,  inflammatory bowel disease, or symptomatic diverticulosis.  She cannot recall her last colonoscopy.  She has been followed by Dr. Juanita Craver, gastroenterology, in the past.  She has chronic pain in her feet which she attributes to "arthritis."    In November 02, 2017 she presented to the emergency department at [facility] complaining of dyspnea and abdominal pain.  A cardiac evaluation revealed no EKG changes or elevated troponin levels.  No frank bleeding was identified on rectal examination.  A complete blood count showed hemoglobin 8.2 hematocrit 27.2 MCV 67.7 MCH 20.5 RDW 21.0 WBC 8.2; platelets 130,000.  A comprehensive metabolic panel was normal except for mildly elevated serum glucose and a mildly decreased calcium level.  Although she was found to be anemic, she was discharged home in stable condition with instructions to follow-up with her primary care provider.  At the time of her initial visit, we discussed in lengthy detail her complex medical history, prior laboratory studies, absence of primary documentation regarding her prior breast cancer and treatment, imaging studies, and importance and methodology for evaluating anemia and thrombocytopenia. There is no evidence of N82 or folic acid deficiency, paraproteinemia, malabsorption syndrome, antiphospholipid antibody syndrome, hemolytic process, copper deficiency, inflammatory process, or hemoglobinopathy.  Her iron studies however confirm iron deficiency anemia.  Those results are detailed below.  Iron deficiency alone does not account for thrombocytopenia. As long as her platelets are above 100,000, no specific intervention is necessary (133,000). At the time of her last visit, we discussed in detail the relative risk versus benefits of iron either by mouth or parenterally.  She has apparently tolerated oral iron in the past.  Although somewhat slower in correcting her underlying anemia as well as symptoms, she chose to be  restarted on ferrous sulfate: 325 mg once daily; with instructions to increase ferrous sulfate: 325 mg twice daily with food if  tolerated; finally ferrous sulfate: 325 mg 3 times daily with food.  She has no adverse reaction.  She has been compliant.  On January 19, 2018 under the direction of Dr. Carol Ada, gastroenterology, she underwent both upper and lower endoscopic evaluation.  A single 8 mm semi-sessile polyp with no bleeding or stigmata recent bleeding was found in the gastric body.  Biopsies were taken with cold forceps for histology.  The results of that biopsy revealed mild chronic gastritis with intestinal metaplasia and reactive atypia.  No Helicobacter pylori was identified by Warthin-Starry stain.  The duodenum was normal.  There is no evidence of any inflammation, ulcerations, erosions, or vascular abnormalities.  There was however an umbilicated lesion noted in the distal gastric body.  It was not clear if this is a true lesion versus a pancreatic cyst exhibiting irritation.  The colonoscopy was normal throughout the entire colon.  There is no evidence of bleeding.  The probable source of bleeding was hemorrhoidal.  Nevertheless it was recommended that capsule endoscopy be scheduled. She tolerated the procedure well under anesthesia.  She continues on ferrous sulfate: 325 mg 3 times daily. She has had no significant gastrointestinal toxicity.  It was strongly recommended that she consider discontinuing completely cigarette smoking.    It was recommended that she discuss either pharmacologic and/or behavioral methods through her primary care physician.  Additionally, she was encouraged also to stay up-to-date through her primary care provider on all of her immunizations including the seasonal influenza vaccine and the pneumococcal vaccines (2).  It is with this background she presents now for the results of laboratory studies while on ferrous sulfate 3 times daily in the setting of iron  deficiency anemia and mild thrombocytopenia.  It is with this background she presents now for laboratory studies and reevaluation while on ferrous sulfate in the setting of iron deficiency anemia as outlined above.  Interval History: In the interim since she reports no new problems or complaints. Her overall energy level is improved. She denies any significant appetite or weight deficit. She is exceedingly hard of hearing. She has chronic dizziness, lightheadedness, and near syncope both with positional changes and walking. She has no headache more syncopal episode. She has no rash or itching. There are no new visual changes.  Her vision however is poor. She has not been to the dentist for many years. She has a dry cough with occasional clear productive sputum.  There is no sore throat or orthopnea.  She denies any dyspnea at rest or on minimal exertion.  She ambulates with the use of a cane. There is no pain or difficulty in swallowing. No fever, shaking chills, sweats, or flulike symptoms are evident. She has no heartburn or indigestion. There is no nausea, vomiting, diarrhea, or constipation. She denies melena or bright red blood per rectum. She reports no urinary frequency, urgency, hematuria, or dysuria. Although she has mild swelling in her ankles at the end of the day, that swelling recedes overnight. With the exception of pain in her right shoulder and feet, she denies any pain syndrome. On an intermittent basis she has numbness and tingling in her fingers and toes.  Past Medical History Reviewed        Family History Reviewed       Social History Reviewed     Past Medical History:  Diagnosis Date  . Arthritis    "shoulders" (05/04/2017)  . Breast cancer, right breast ([facility]) 1991  . GERD (gastroesophageal reflux disease)   .  Hyperlipidemia   . Hypertension   . Non-stress test nonreactive, 04/24/12, normal 12/10/2012  . OSA on CPAP    "don't wear it all the time" (05/04/2017)  . PVD  (peripheral vascular disease) ([facility])   . Tobacco use 12/10/2012  . Type II diabetes mellitus ([facility])    Allergies  Allergen Reactions  . Bactrim [Sulfamethoxazole-Trimethoprim] Swelling    Marked angioedema requiring hospitalization  . Penicillins Other (See Comments)    Has patient had a PCN reaction causing immediate rash, facial/tongue/throat swelling, SOB or lightheadedness with hypotension: Unk Has patient had a PCN reaction causing severe rash involving mucus membranes or skin necrosis: Unk Has patient had a PCN reaction that required hospitalization: Unk Has patient had a PCN reaction occurring within the last 10 years: No If all of the above answers are "NO", then may proceed with Cephalosporin use.   Current Outpatient Medications on File Prior to Visit  Medication Sig  . alendronate (FOSAMAX) 70 MG tablet Take 70 mg by mouth every Wednesday. Take with a full glass of water on an empty stomach  . aspirin EC 81 MG tablet Take 81 mg by mouth daily.  . clotrimazole-betamethasone (LOTRISONE) cream Apply to affected area 2 times daily prn  . EPINEPHrine 0.3 mg/0.3 mL IJ SOAJ injection Inject 0.3 mLs (0.3 mg total) into the muscle once as needed (prn worsening angioedema).  . ferrous sulfate 325 (65 FE) MG EC tablet Take 1 tablet (325 mg total) by mouth 3 (three) times daily with meals.  . metFORMIN (GLUCOPHAGE-XR) 500 MG 24 hr tablet Take 500 mg by mouth 2 (two) times daily.  Marland Kitchen MICARDIS 40 MG tablet Take 40 mg by mouth daily.  . mometasone (ELOCON) 0.1 % lotion Apply 1 application topically daily as needed (IRRITATION).   Marland Kitchen mupirocin ointment (BACTROBAN) 2 % 1 application as needed.   . pantoprazole (PROTONIX) 40 MG tablet Take 1 tablet (40 mg total) by mouth daily at 12 noon.  . pravastatin (PRAVACHOL) 40 MG tablet Take 40 mg by mouth daily.   Marland Kitchen tiotropium (SPIRIVA) 18 MCG inhalation capsule Place 18 mcg into inhaler and inhale daily.    No current facility-administered medications on  file prior to visit.     Review of Systems: Constitutional: No fever, sweats, or shaking chills.  No appetite or weight deficit; energy level fair. Skin: No rash, scaling, sores, lumps, or jaundice; history of atopic dermatitis. HEENT: No new visual changes; bilateral age-related hearing deficit. Pulmonary: No unusual cough, sore throat, or orthopnea; active tobacco dependence; DOE; obstructive sleep apnea noncompliant with CPAP. Breasts: A right modified radical mastectomy in 1991 for breast cancer (stage and postoperative treatment and/or follow-up unknown). Cardiovascular: No coronary artery disease, angina, or myocardial infarction.  No cardiac dysrhythmia; essential hypertension and dyslipidemia. Gastrointestinal: No indigestion, dysphagia, abdominal pain, diarrhea, or constipation.  No change in bowel habits; chronic pancreatitis; GERD.  No melena or bright red blood per rectum.  No nausea or vomiting. Genitourinary: No urinary frequency, urgency, hematuria, or dysuria. Musculoskeletal: Chronic arthralgias involving the shoulders and feet.  No myalgias; no joint swelling, pain, or instability. Hematologic: No bleeding tendency or easy bruisability; anemia and thrombocytopenia. Endocrine: No intolerance to heat or cold; no thyroid disease; non-insulin requiring diabetes mellitus. Vascular: No peripheral arterial disease; since 2010, a "clot" in the left groin has been monitored.  No venous thromboembolic disease. Psychological: No anxiety, depression, or mood changes; no mental health illnesses. Neurological: Persistent dizziness, lightheadedness, and near syncope both him positional  changes and when walking on an episodic basis; no headache or syncope.  Numbness and tingling of the fingers and toes on an intermittent basis.    Physical Examination: Vital Signs: Body surface area is 1.82 meters squared.  Vitals:   01/22/18 1030  BP: (!) 125/92  Pulse: 72  Resp: 18  Temp: 98.2 F (36.8  C)  SpO2: 98%    Filed Weights   01/22/18 1030  Weight: 163 lb 14.4 oz (74.3 kg)  Body mass index is 29.03 kg/m. Constitutional:  Trinidi Toppins is a fully nourished and developed African-American albeit overweight.  She looks age appropriate.  She is friendly and cooperative without respiratory compromise at rest. Skin: No rashes, scaling, dryness, jaundice, or itching. HEENT: Head is normocephalic and atraumatic.  Pupils are equal round and reactive to light and accommodation.  Sclerae are anicteric.  Conjunctivae are pink.  No sinus tenderness nor oropharyngeal lesions.  Lips without cracking or peeling; tongue without mass, inflammation, or nodularity.  Mucous membranes are moist.  She is hard of hearing. Neck: Supple and symmetric.  No jugular venous distention or thyromegaly.  Trachea is midline. Lymphatics: No cervical or supraclavicular lymphadenopathy.  No epitrochlear, axillary, or inguinal lymphadenopathy is appreciated; she has lymphedema of the right upper extremity extending from the deltoid to the fingers. Respiratory/chest: There is a well-healed mastectomy scar over the right anterior chest wall without any nodularity or skin changes.  Breath sounds are diminished but clear to auscultation and percussion.  Normal excursion and respiratory effort. Back: Symmetric without deformity or tenderness. Cardiovascular: Heart rate and rhythm are regular. Gastrointestinal: Abdomen is obese, soft, nontender; no organomegaly.  Bowel sounds are normoactive.  No masses are appreciated. Extremities: In the lower extremities, there is no asymmetric swelling, erythema, tenderness, or cord formation.  No clubbing, cyanosis, nor edema. Hematologic: No petechiae, hematomas, or ecchymoses. Psychological:  She is oriented to person, place, and time; normal affect; memory and cognitive deficit. Neurological: There are no gross neurologic deficits.  Laboratory Results: January 22, 2018  Ref  Range & Units 10:06 33mo ago 64mo ago 46mo ago 52mo ago  WBC Count 4.0 - 10.5 K/uL 4.0  4.3  4.5  9.3  7.9   RBC 3.87 - 5.11 MIL/uL 4.84  4.49  4.02  4.35  3.94   Hemoglobin 12.0 - 15.0 g/dL 10.3Low   8.6Low  CM 8.2Low   10.5Low   9.6Low    HCT 36.0 - 46.0 % 33.6Low   28.9Low   27.2Low   32.9Low   29.7Low    MCV 80.0 - 100.0 fL 69.4Low   64.4Low   67.7Low  R 75.6Low  R 75.4Low  R  MCH 26.0 - 34.0 pg 21.3Low   19.2Low   20.4Low   24.1Low   24.4Low    MCHC 30.0 - 36.0 g/dL 30.7  29.8Low   30.1  31.9  32.3   RDW 11.5 - 15.5 % 30.3High   20.9High   21.0High   17.2High   17.4High    Platelet Count 150 - 400 K/uL 133Low   116Low  CM 130Low  CM PLATELET CLUMPS NOTED ON SMEAR, UNABLE TO ESTIMATE  87Low  CM  nRBC 0.0 - 0.2 % 0.0  0.0      Neutrophils Relative % % 67  62   85    Neutro Abs 1.7 - 7.7 K/uL 2.7  2.7   8.0High     Lymphocytes Relative % 22  28   7  Lymphs Abs 0.7 - 4.0 K/uL 0.9  1.2   0.6Low     Monocytes Relative % 9  8   7     Monocytes Absolute 0.1 - 1.0 K/uL 0.4  0.3   0.6    Eosinophils Relative % 1  1   1     Eosinophils Absolute 0.0 - 0.5 K/uL 0.1  0.0   0.1 R   Basophils Relative % 1  1   0    Basophils Absolute 0.0 - 0.1 K/uL 0.0  0.0   0.0 CM   Immature Granulocytes % 0  0      Abs Immature Granulocytes 0.00 - 0.07 K/uL 0.01  0.01      Schistocytes  PRESENT       Target Cells  PRESENT  PRESENT      Comment: Performed at [facility], [place] [address] [place], [place] [address]  Platelet Morphology   LARGE PLT SEEN CM       Ref Range & Units 10:06 (01/22/18) 31mo ago (11/02/17) 51mo ago (06/01/17) 50mo ago (05/05/17) 4mo ago (05/04/17)  Sodium 135 - 145 mmol/L 145  144  139  139  140   Potassium 3.5 - 5.1 mmol/L 4.2  3.5  4.3  4.5  4.4   Chloride 98 - 111 mmol/L 111  110  103 R 109 R 108 R  CO2 22 - 32 mmol/L 27  24  23   20Low   22   Glucose, Bld 70 - 99 mg/dL 100High   113High   83 R 202High  R 125High  R  BUN 8 - 23 mg/dL 15  9  14  R 29High  R 25High   R  Creatinine, Ser 0.44 - 1.00 mg/dL 0.98  0.85  0.88  1.13High   1.39High    Calcium 8.9 - 10.3 mg/dL 9.0  8.5Low   9.2  8.8Low   8.8Low    Total Protein 6.5 - 8.1 g/dL 6.8  6.5  6.8     Albumin 3.5 - 5.0 g/dL 3.5  3.6  3.8     AST 15 - 41 U/L 17  24  33     ALT 0 - 44 U/L 10  20  24  R    Alkaline Phosphatase 38 - 126 U/L 73  73  85     Total Bilirubin 0.3 - 1.2 mg/dL 0.4  0.7  0.5     GFR calc non Af Amer >60 mL/min 50Low   59Low   57Low   42Low   33Low    GFR calc Af Amer >60 mL/min 58Low   >60 CM >60 CM 49Low  CM 38Low  CM  Comment: (NOTE)   Reticulocyte count 0.8% Reticulocyte hemoglobin 20.6 0.7 Ferritin 24  December 12, 2017  Ref Range & Units 13d ago  WBC Count 4.0 - 10.5 K/uL 4.3   RBC 3.87 - 5.11 MIL/uL 4.49   Hemoglobin 12.0 - 15.0 g/dL 8.6Low    Comment: Reticulocyte Hemoglobin testing  may be clinically indicated,  consider ordering this additional  test [id number]   HCT 36.0 - 46.0 % 28.9Low    MCV 80.0 - 100.0 fL 64.4Low    MCH 26.0 - 34.0 pg 19.2Low    MCHC 30.0 - 36.0 g/dL 29.8Low    RDW 11.5 - 15.5 % 20.9High    Platelet Count 150 - 400 K/uL 116Low    Comment: PLATELET COUNT CONFIRMED BY SMEAR  nRBC 0.0 -  0.2 % 0.0   Neutrophils Relative % % 62   Neutro Abs 1.7 - 7.7 K/uL 2.7   Lymphocytes Relative % 28   Lymphs Abs 0.7 - 4.0 K/uL 1.2   Monocytes Relative % 8   Monocytes Absolute 0.1 - 1.0 K/uL 0.3   Eosinophils Relative % 1   Eosinophils Absolute 0.0 - 0.5 K/uL 0.0   Basophils Relative % 1   Basophils Absolute 0.0 - 0.1 K/uL 0.0   Immature Granulocytes % 0   Abs Immature Granulocytes 0.00 - 0.07 K/uL 0.01   Target Cells  PRESENT   Platelet Morphology  LARGE PLT SEEN   Reticulocyte count 0.9% Vitamin U31 497 Folic acid 02.6 Serum copper 149 Ferritin 10 Iron/TIBC 17/383 Iron saturation 4% Hemoglobin electrophoresis: Normal adult hemoglobin present. Serum protein electrophoresis, immunofixation, and free kappa/lambda light chain analysis: No  monoclonal M spike identified. Free kappa light chain 23.1 Free lambda light chain 17.4 Free kappa/lambda light chain ratio 1.33 CRP 0.8 Amylase 85 Lipase 31  Ref Range & Units 13d ago  Antigliadin Abs, IgA 0 - 19 units 5   Comment: (NOTE)           Negative          0 - 19           Weak Positive       20 - 30           Moderate to Strong Positive  >30   Gliadin IgG 0 - 19 units 2   Comment: (NOTE)           Negative          0 - 19           Weak Positive       20 - 30           Moderate to Strong Positive  >30   Tissue Transglutaminase Ab, IgA 0 - 3 U/mL <2   Comment: (NOTE)                Negative    0 - 3                Weak Positive  4 - 10                Positive      >10  Tissue Transglutaminase (tTG) has been identified  as the endomysial antigen. Studies have demonstr-  ated that endomysial IgA antibodies have over 99%  specificity for gluten sensitive enteropathy.  Performed At: [facility]  [place], [place] [id number]  TSH 0.243 (0.308-3.96)  Ref Range & Units 13d ago  Anticardiolipin IgG 0 - 14 GPL U/mL <9   Comment: (NOTE)              Negative:       <15              Indeterminate:   15 - 20              Low-Med Positive: >20 - 80              High Positive:     >80   Anticardiolipin IgM 0 - 12 MPL U/mL <9   Comment: (NOTE)              Negative:       <13  Indeterminate:   13 - 20              Low-Med Positive: >20 - 80              High Positive:     >80   PTT Lupus Anticoagulant 0.0 - 51.9 sec 27.8   DRVVT 0.0 - 47.0 sec 35.3   Lupus Anticoag Interp  Comment:   Comment: (NOTE)      Diagnostic/Imaging Studies: November 02, 2017 CT ABDOMEN AND PELVIS  WITHOUT CONTRAST  TECHNIQUE: Multidetector CT imaging of the abdomen and pelvis was performed following the standard protocol without IV contrast.  COMPARISON: 06/01/2017  FINDINGS: Lower chest: Normal heart size without pericardial effusion. No acute pulmonary consolidation, effusion or pneumothorax. No dominant mass.  Hepatobiliary: No focal liver abnormality is seen. No gallstones, gallbladder wall thickening, or biliary dilatation.  Pancreas: Scattered pancreatic calcifications consistent with stigmata of chronic pancreatitis. No acute inflammation, ductal dilatation or mass given limitations of a noncontrast study.  Spleen: Normal size spleen without mass.  Adrenals/Urinary Tract: Normal bilateral adrenal glands. No nephrolithiasis nor hydroureteronephrosis. Small bilateral parapelvic renal cysts are redemonstrated. The urinary bladder is unremarkable without focal mural thickening or calculi.  Stomach/Bowel: Small hiatal hernia. Decompressed stomach. The duodenal sweep and ligament of Treitz are normal. No bowel obstruction or inflammation. The distal and terminal ileum are normal. What appears to represent the appendix is normal in caliber without inflammatory change.  Vascular/Lymphatic: Moderate aortoiliac atherosclerosis. No lymphadenopathy by CT size criteria.  Reproductive: Uterus and bilateral adnexa are unremarkable.  Other: No free air nor free fluid. Small bilateral fat containing inguinal hernias.  Musculoskeletal: Partially included uncemented right hip arthroplasty without complicating features. Mild degenerative disc disease of the included lower thoracic and upper lumbar spine to L3.  IMPRESSION: 1. Diminutive normal caliber to appearance of the appendix. No pericecal inflammation. No bowel obstruction or inflammatory change. 2. Stigmata of chronic pancreatitis with scattered pancreatic gland calcifications. 3. Aortoiliac  atherosclerosis without aneurysm. 4. Bilateral small parapelvic renal cysts.  Ashley Royalty M.D.  June 01, 2017 CT ABDOMEN AND PELVIS WITH CONTRAST  TECHNIQUE: Multidetector CT imaging of the abdomen and pelvis was performed using the standard protocol following bolus administration of intravenous contrast.  CONTRAST: 1100mL ISOVUE-300 IOPAMIDOL (ISOVUE-300) INJECTION 61%  COMPARISON: Chest CTA 09/07/2016. CT Pelvis 11/22/2009  FINDINGS: Lower chest: Stable lung bases since 2018. No cardiomegaly or pericardial effusion. Calcified aortic atherosclerosis. No pleural effusion. Minor lung base atelectasis and/or scarring.  Hepatobiliary: Negative liver and gallbladder.  Pancreas: Punctate parenchymal calcifications scattered throughout the entire pancreas. No significant pancreatic atrophy. No ductal dilatation. No inflammation.  Spleen: Negative.  Adrenals/Urinary Tract: Normal adrenal glands. Bilateral renal enhancement and contrast excretion is symmetric and within normal limits. There are small benign bilateral renal parapelvic cysts. Occasional small benign renal cortical cyst. Unremarkable course of both ureters; the distal right ureter is obscured by streak artifact in the pelvis.  Some of the urinary bladder is obscured by streak artifact. The visible bladder is unremarkable.  Stomach/Bowel: Negative rectum and sigmoid colon aside from redundancy and some retained stool. Decompressed and negative left colon. Mildly redundant but otherwise negative transverse colon. Mild retained stool in the right colon. Normal terminal ileum. Diminutive or absent appendix. No pericecal inflammation.  No dilated or abnormal small bowel loops are identified. The stomach is decompressed. The duodenum is unremarkable.  No abdominal free air or free fluid.  Vascular/Lymphatic: Aortoiliac calcified atherosclerosis. Major arterial structures in the  abdomen and pelvis  remain patent. There is up to moderate stenosis of the distal abdominal aorta (series 3, image 36).  The portal venous system appears patent.  No lymphadenopathy.  Reproductive: Within normal limits.  Other: Streak artifact through the pelvis from the right hip hardware. No pelvic free fluid is evident.  Musculoskeletal: Chronic degenerative changes in the spine. Previous right hip arthroplasty. No acute osseous abnormality identified.  IMPRESSION: 1. No acute or inflammatory process identified in the Abdomen or pelvis. 2. Aortic Atherosclerosis (ICD10-I70.0) with up to moderate stenosis of the distal abdominal aorta suspected. 3. Chronic calcific pancreatitis. No active inflammation.  Genevie Ann M.D.  Summary/Assessment: In the setting of iron deficiency anemia and improving thrombocytopenia, she presents now following upper and lower endoscopic evaluation; continuing on ferrous sulfate, for the results of her most recent laboratory evaluation with recommendations.  Although those records are not available for review, by report, a right modified radical mastectomy was performed without further endocrine or cytotoxic chemotherapy. She cannot recall any further details. Her last screening mammogram on the left was in 2018, reportedly normal. She is accompanied by her attentive daughter [name]. Her memory is exceedingly poor for both distant and recent details. Her daughter [name] was the primary informant. Although she has gastroesophageal reflux disease, controlled with medication, she has no peptic ulcer disease. There is no viral hepatitis, inflammatory bowel disease, or symptomatic diverticulosis.  She cannot recall her last colonoscopy.  She has been followed by Dr. Juanita Craver, gastroenterology, in the past.  She has chronic pain in her feet which she attributes to "arthritis."  On November 02, 2017 she presented to the emergency department at [facility]  complaining of dyspnea and abdominal pain.  A cardiac evaluation revealed no EKG changes or elevated troponin levels.  No frank bleeding was identified on rectal examination.  A complete blood count showed hemoglobin 8.2 hematocrit 27.2 MCV 67.7 MCH 20.5 RDW 21.0 WBC 8.2; platelets 130,000.  A comprehensive metabolic panel was normal except for mildly elevated serum glucose and a mildly decreased calcium level.  Although she was found to be anemic, she was discharged home in stable condition with instructions to follow-up with her primary care provider.  At the time of her initial visit, we discussed in lengthy detail her complex medical history, prior laboratory studies, absence of primary documentation regarding her prior breast cancer and treatment, imaging studies, and importance and methodology for evaluating anemia and thrombocytopenia. There is no evidence of W43 or folic acid deficiency, paraproteinemia, malabsorption syndrome, antiphospholipid antibody syndrome, hemolytic process, copper deficiency, inflammatory process, or hemoglobinopathy.  Her iron studies however confirm iron deficiency anemia.  Those results are detailed below.  Iron deficiency alone does not account for thrombocytopenia. As long as her platelets are above 100,000, no specific intervention is necessary (133,000). At the time of her last visit, we discussed in detail the relative risk versus benefits of iron either by mouth or parenterally.  She has apparently tolerated oral iron in the past.  Although somewhat slower in correcting her underlying anemia as well as symptoms, she chose to be restarted on ferrous sulfate: 325 mg once daily; with instructions to increase ferrous sulfate: 325 mg twice daily with food if tolerated; finally ferrous sulfate: 325 mg 3 times daily with food.  She has no adverse reaction.  She has been compliant.  On January 19, 2018 under the direction of Dr.Patrick Quince [facility], gastroenterology, she underwent  both upper and lower endoscopic evaluation.  A single 8 mm semi-sessile polyp with no bleeding or stigmata recent bleeding was found in the gastric body.  Biopsies were taken with cold forceps for histology.  The results of that biopsy revealed mild chronic gastritis with intestinal metaplasia and reactive atypia.  No Helicobacter pylori was identified by Warthin-Starry stain.  The duodenum was normal.  There is no evidence of any inflammation, ulcerations, erosions, or vascular abnormalities.  There was however an umbilicated lesion noted in the distal gastric body.  It was not clear if this is a true lesion versus a pancreatic cyst exhibiting irritation.  The colonoscopy was normal throughout the entire colon.  There is no evidence of bleeding.  The probable source of bleeding was hemorrhoidal.  She tolerated the procedure well under anesthesia.  She continues on ferrous sulfate: 325 mg 3 times daily.  She has had no significant gastrointestinal toxicity.  It was strongly recommended that she consider discontinuing completely cigarette smoking.    It was recommended that she discuss either pharmacologic and/or behavioral methods through her primary care physician.  Additionally, she was encouraged also to stay up-to-date through her primary care provider on all of her immunizations including the seasonal influenza vaccine and the pneumococcal vaccines (2).  It is with this background she presents now for the results of laboratory studies while on ferrous sulfate 3 times daily in the setting of iron deficiency anemia and mild thrombocytopenia.   In the interim since she reports no new problems or complaints. Her overall energy level is improved. She denies any significant appetite or weight deficit. She is exceedingly hard of hearing. She has chronic dizziness, lightheadedness, and near syncope both with positional changes and walking. She has no headache more syncopal episode. She has no rash or itching.  There are no new visual changes.  Her vision however is poor. She has not been to the dentist for many years. She has a dry cough with occasional clear productive sputum.  There is no sore throat or orthopnea.  She denies any dyspnea at rest or on minimal exertion.  She ambulates with the use of a cane. There is no pain or difficulty in swallowing. No fever, shaking chills, sweats, or flulike symptoms are evident. She has no heartburn or indigestion. There is no nausea, vomiting, diarrhea, or constipation.  She denies melena or bright red blood per rectum. She reports no urinary frequency, urgency, hematuria, or dysuria. Although she has mild swelling in her ankles at the end of the day, that swelling recedes overnight. With the exception of pain in her right shoulder and feet, she denies any pain syndrome. On an intermittent basis she has numbness and tingling in her fingers and toes.  Her other comorbid problems include non-insulin-requiring diabetes mellitus; primary hypertension; elevated BMI; bilateral age-related hearing deficit; gastroesophageal reflux disease; osteoporosis; chronic pancreatitis; degenerative joint disease involving the right shoulder and feet; dyslipidemia; obstructive sleep apnea noncompliant with CPAP; peripheral arterial disease; active tobacco dependence; previous atopic dermatitis; chronic kidney injury; restless leg syndrome; and breast cancer identified initially in 1991.   Recommendation/Plan: We discussed in detail the results of her laboratory studies from today.  Although still iron deficient, her hemoglobin has increased significantly over the past 4 weeks, from 8.6 to 10.3 g/dL.  Her ferritin however remains low.  Those results are detailed above.  It was recommended that she continue iron supplementation with ferrous sulfate: 325 mg 3 times daily.  She was advised to call us if gastrointestinal toxicity occurs.  We reviewed in lengthy detail the results of her upper  and lower endoscopic evaluation.  With the exception of mild chronic gastritis and sessile polyps in the stomach, there was no evidence of active bleeding.  Those results are detailed above.  It was recommended that she consider discontinuing completely cigarette smoking.  She should discuss pharmacologic and/or behavioral methods through her primary care physician, Dr. Baird Cancer.  She was encouraged also to stay up-to-date through her primary care office on all of her immunizations including the seasonal influenza vaccine and the pneumococcal vaccines (2).  Barring any unforeseen complications, her next scheduled doctor visit with laboratory studies is on December 16.  She was advised to call us in the interim should any new or untoward problems arise.  The total time spent discussing the laboratory studies, upper and lower endoscopic evaluation, and recommendation was 25 minutes.  At least 50% of that time was spent in face-to-face discussion, counseling, and answering questions.  All questions were answered to her satisfaction. .  This note was dictated using voice activated technology/software.  Unfortunately, typographical errors are not uncommon, and transcription is subject to mistakes and regrettably misinterpretation.  If necessary, clarification of the above information can be discussed with me at any time.  FOLLOW UP: AS DIRECTED   cc:     Glendale Chard, MD           Juanita Craver, MD           Carol Ada, MD   Henreitta Leber, MD  Hematology/Oncology [facility] [address] [place], [place] [address] Office: [phone] REVQ: [phone]

## 2018-01-22 NOTE — Telephone Encounter (Signed)
Printed calendar and avs. °

## 2018-02-07 ENCOUNTER — Ambulatory Visit (INDEPENDENT_AMBULATORY_CARE_PROVIDER_SITE_OTHER): Payer: Medicare Other

## 2018-02-07 ENCOUNTER — Ambulatory Visit (INDEPENDENT_AMBULATORY_CARE_PROVIDER_SITE_OTHER): Payer: Medicare Other | Admitting: Nurse Practitioner

## 2018-02-07 ENCOUNTER — Encounter: Payer: Self-pay | Admitting: Nurse Practitioner

## 2018-02-07 VITALS — BP 148/82 | HR 74 | Temp 98.6°F | Ht 63.0 in | Wt 163.8 lb

## 2018-02-07 VITALS — BP 148/82 | HR 74 | Temp 98.6°F | Wt 163.8 lb

## 2018-02-07 DIAGNOSIS — R059 Cough, unspecified: Secondary | ICD-10-CM

## 2018-02-07 DIAGNOSIS — Z0000 Encounter for general adult medical examination without abnormal findings: Secondary | ICD-10-CM | POA: Diagnosis not present

## 2018-02-07 DIAGNOSIS — I10 Essential (primary) hypertension: Secondary | ICD-10-CM | POA: Diagnosis not present

## 2018-02-07 DIAGNOSIS — G629 Polyneuropathy, unspecified: Secondary | ICD-10-CM

## 2018-02-07 DIAGNOSIS — R7303 Prediabetes: Secondary | ICD-10-CM | POA: Diagnosis not present

## 2018-02-07 DIAGNOSIS — R05 Cough: Secondary | ICD-10-CM

## 2018-02-07 LAB — POCT URINALYSIS DIPSTICK
Bilirubin, UA: NEGATIVE
Blood, UA: POSITIVE
Glucose, UA: NEGATIVE
Ketones, UA: NEGATIVE
Nitrite, UA: NEGATIVE
Protein, UA: NEGATIVE
Spec Grav, UA: 1.01 (ref 1.010–1.025)
Urobilinogen, UA: 0.2 E.U./dL
pH, UA: 5.5 (ref 5.0–8.0)

## 2018-02-07 LAB — POCT UA - MICROALBUMIN
Albumin/Creatinine Ratio, Urine, POC: 30
Creatinine, POC: 50 mg/dL
Microalbumin Ur, POC: 10 mg/L

## 2018-02-07 MED ORDER — BENZONATATE 100 MG PO CAPS: 100.0000 mg | ORAL_CAPSULE | Freq: Four times a day (QID) | ORAL | 1 refills | Status: DC | PRN

## 2018-02-07 NOTE — Progress Notes (Signed)
Subjective:   SHAKA ZECH is a 84 y.o. female who presents for Medicare Annual (Subsequent) preventive examination.  Review of Systems:  n/a Cardiac Risk Factors include: advanced age (>45men, >72 women);diabetes mellitus;hypertension;sedentary lifestyle     Objective:     Vitals: BP (!) 148/82 (BP Location: Left Arm, Patient Position: Sitting)   Pulse 74   Temp 98.6 F (37 C) (Oral)   Ht 5\' 3"  (1.6 m)   Wt 163 lb 12.8 oz (74.3 kg)   SpO2 95%   BMI 29.02 kg/m   Body mass index is 29.02 kg/m.  Advanced Directives 02/07/2018 02/07/2018 01/22/2018 01/19/2018 05/04/2017 09/07/2016 12/18/2015  Does Patient Have a Medical Advance Directive? No No No No No No No  Would patient like information on creating a medical advance directive? Yes (MAU/Ambulatory/Procedural Areas - Information given) No - Patient declined - - No - Patient declined No - Patient declined -    Tobacco Social History   Tobacco Use  Smoking Status Current Some Day Smoker  . Packs/day: 0.25  . Years: 70.00  . Pack years: 17.50  . Types: Cigarettes  . Start date: 03/08/1947  Smokeless Tobacco Never Used  Tobacco Comment   has tried cold [place]     Ready to quit: Yes Counseling given: Not Answered Comment: has tried cold [place]   Clinical Intake:  Pre-visit preparation completed: Yes  Pain : No/denies pain     Nutritional Status: BMI 25 -29 Overweight Nutritional Risks: None Diabetes: Yes CBG done?: No Did pt. bring in CBG monitor from home?: No  How often do you need to have someone help you when you read instructions, pamphlets, or other written materials from your doctor or pharmacy?: 1 - Never What is the last grade level you completed in school?: 10th grade  Interpreter Needed?: No  Information entered by :: NAllen LPN  Past Medical History:  Diagnosis Date  . Arthritis    "shoulders" (05/04/2017)  . Breast cancer, right breast ([place]) 1991  . GERD (gastroesophageal reflux  disease)   . Hyperlipidemia   . Hypertension   . Non-stress test nonreactive, 04/24/12, normal 12/10/2012  . OSA on CPAP    "don't wear it all the time" (05/04/2017)  . PVD (peripheral vascular disease) ([place])   . Tobacco use 12/10/2012  . Type II diabetes mellitus ([place])    Past Surgical History:  Procedure Laterality Date  . APPENDECTOMY    . BIOPSY  01/19/2018   Procedure: BIOPSY;  Surgeon: Carol Ada, MD;  Location: [facility] ENDOSCOPY;  Service: Endoscopy;;  . BREAST BIOPSY Right 1991  . CARDIOVASCULAR STRESS TEST  04/24/2012   No significant ST segment change suggestive of ischemia.  Marland Kitchen CATARACT EXTRACTION, BILATERAL Bilateral   . COLONOSCOPY WITH PROPOFOL N/A 01/19/2018   Procedure: COLONOSCOPY WITH PROPOFOL;  Surgeon: Carol Ada, MD;  Location: [facility] ENDOSCOPY;  Service: Endoscopy;  Laterality: N/A;  . ESOPHAGOGASTRODUODENOSCOPY (EGD) WITH PROPOFOL N/A 01/19/2018   Procedure: ESOPHAGOGASTRODUODENOSCOPY (EGD) WITH PROPOFOL;  Surgeon: Carol Ada, MD;  Location: [facility] ENDOSCOPY;  Service: Endoscopy;  Laterality: N/A;  . JOINT REPLACEMENT    . LOWER EXTREMITY ARTERIAL DOPPLER Bilateral 05/07/2012   Left ABI-demonstrated mild arterial insufficiency. Right CIA 50-69% diameter reduction. Right SFA less than 50% diameter reduction. Left SFA 70-99% diameter reduction. Bilateral Runoff-Posterior tibial arteries appeared occluded.  Marland Kitchen MASTECTOMY PARTIAL / LUMPECTOMY W/ AXILLARY LYMPHADENECTOMY Right 1991   . TOTAL HIP ARTHROPLASTY Right 2011   Family History  Problem Relation Age of Onset  .  Heart disease Mother   . Stroke Mother   . Stroke Father   . Cancer Sister        GIST  . Cancer Brother        thyroid   . Diabetes Son   . Stroke Son    Social History   Socioeconomic History  . Marital status: Widowed    Spouse name: Not on file  . Number of children: Not on file  . Years of education: Not on file  . Highest education level: Not on file  Occupational History  . Occupation:  retired  Scientific laboratory technician  . Financial resource strain: Not hard at all  . Food insecurity:    Worry: Never true    Inability: Never true  . Transportation needs:    Medical: No    Non-medical: No  Tobacco Use  . Smoking status: Current Some Day Smoker    Packs/day: 0.25    Years: 70.00    Pack years: 17.50    Types: Cigarettes    Start date: 03/08/1947  . Smokeless tobacco: Never Used  . Tobacco comment: has tried cold [place]  Substance and Sexual Activity  . Alcohol use: No    Alcohol/week: 0.0 standard drinks  . Drug use: No  . Sexual activity: Not Currently  Lifestyle  . Physical activity:    Days per week: 0 days    Minutes per session: 0 min  . Stress: Not at all  Relationships  . Social connections:    Talks on phone: Not on file    Gets together: Not on file    Attends religious service: Not on file    Active member of club or organization: Not on file    Attends meetings of clubs or organizations: Not on file    Relationship status: Not on file  Other Topics Concern  . Not on file  Social History Narrative  . Not on file    Outpatient Encounter Medications as of 02/07/2018  Medication Sig  . alendronate (FOSAMAX) 70 MG tablet Take 70 mg by mouth every Wednesday. Take with a full glass of water on an empty stomach  . aspirin EC 81 MG tablet Take 81 mg by mouth daily.  . clotrimazole-betamethasone (LOTRISONE) cream Apply to affected area 2 times daily prn  . ferrous sulfate 325 (65 FE) MG EC tablet Take 1 tablet (325 mg total) by mouth 3 (three) times daily with meals.  . metFORMIN (GLUCOPHAGE-XR) 500 MG 24 hr tablet Take 500 mg by mouth 2 (two) times daily.  Marland Kitchen MICARDIS 40 MG tablet Take 40 mg by mouth daily.  . mometasone (ELOCON) 0.1 % lotion Apply 1 application topically daily as needed (IRRITATION).   Marland Kitchen mupirocin ointment (BACTROBAN) 2 % 1 application as needed.   . pantoprazole (PROTONIX) 40 MG tablet Take 1 tablet (40 mg total) by mouth daily at 12 noon.  .  pravastatin (PRAVACHOL) 40 MG tablet Take 40 mg by mouth daily.   Marland Kitchen tiotropium (SPIRIVA) 18 MCG inhalation capsule Place 18 mcg into inhaler and inhale daily.   Marland Kitchen EPINEPHrine 0.3 mg/0.3 mL IJ SOAJ injection Inject 0.3 mLs (0.3 mg total) into the muscle once as needed (prn worsening angioedema). (Patient not taking: Reported on 01/22/2018)   No facility-administered encounter medications on file as of 02/07/2018.     Activities of Daily Living In your present state of health, do you have any difficulty performing the following activities: 02/07/2018 05/04/2017  Hearing? Tempie Donning  Comment wears hearing aides -  Vision? Y N  Comment sometimes gets blurry -  Difficulty concentrating or making decisions? Y N  Comment forgetfulnesss -  Walking or climbing stairs? Y N  Comment gets tired, uses cane -  Dressing or bathing? N N  Doing errands, shopping? Tempie Donning  Comment has someone with all the time "she doesn't driveChief Executive Officer and eating ? N -  Using the Toilet? N -  In the past six months, have you accidently leaked urine? Y -  Comment occasional -  Do you have problems with loss of bowel control? N -  Managing your Medications? N -  Managing your Finances? N -  Housekeeping or managing your Housekeeping? N -  Some recent data might be hidden    Patient Care Team: Glendale Chard, MD as PCP - General (Internal Medicine)    Assessment:   This is a routine wellness examination for Carlean.  Exercise Activities and Dietary recommendations Current Exercise Habits: The patient does not participate in regular exercise at present, Exercise limited by: orthopedic condition(s)  Goals   None     Fall Risk Fall Risk  02/07/2018 04/15/2013  Falls in the past year? 0 No  Risk for fall due to : Medication side effect Impaired balance/gait  Follow up Falls prevention discussed -   Is the patient's home free of loose throw rugs in walkways, pet beds, electrical cords, etc?   yes      Grab bars  in the bathroom? yes      Handrails on the stairs?   n/a      Adequate lighting?   yes  Timed Get Up and Go performed: n/a  Depression Screen PHQ 2/9 Scores 02/07/2018 04/15/2013  PHQ - 2 Score 0 0     Cognitive Function     6CIT Screen 02/07/2018  What Year? 0 points  What month? 0 points  What time? 0 points  Count back from 20 0 points  Months in reverse 4 points  Repeat phrase 2 points  Total Score 6    Immunization History  Administered Date(s) Administered  . Influenza Whole 02/16/2010  . Pneumococcal-Unspecified 06/19/2013    Qualifies for Shingles Vaccine? yes  Screening Tests Health Maintenance  Topic Date Due  . FOOT EXAM  [birth year]/20/1936  . OPHTHALMOLOGY EXAM  11/20/1938  . TETANUS/TDAP  11/20/1947  . PNA vac Low Risk Adult (2 of 2 - PCV13) 06/20/2014  . INFLUENZA VACCINE  10/05/2017  . HEMOGLOBIN A1C  11/01/2017  . DEXA SCAN  Completed    Cancer Screenings: Lung: Low Dose CT Chest recommended if Age 47-80 years, 30 pack-year currently smoking OR have quit w/in 15years. Patient does not qualify. Breast:  Up to date on Mammogram? Yes   Up to date of Bone Density/Dexa? Yes Colorectal: not required  Additional Screenings: : Hepatitis C Screening: n/a     Plan:   Patient does not have any health goals at this time.  I have personally reviewed and noted the following in the patient's chart:   . Medical and social history . Use of alcohol, tobacco or illicit drugs  . Current medications and supplements . Functional ability and status . Nutritional status . Physical activity . Advanced directives . List of other physicians . Hospitalizations, surgeries, and ER visits in previous 12 months . Vitals . Screenings to include cognitive, depression, and falls . Referrals and appointments  In addition, I have reviewed and discussed with  patient certain preventive protocols, quality metrics, and best practice recommendations. A written personalized care plan  for preventive services as well as general preventive health recommendations were provided to patient.     Kellie Simmering, LPN  [id number]

## 2018-02-07 NOTE — Patient Instructions (Signed)

## 2018-02-07 NOTE — Patient Instructions (Signed)
Leah Wiggins , Thank you for taking time to come for your Medicare Wellness Visit. I appreciate your ongoing commitment to your health goals. Please review the following plan we discussed and let me know if I can assist you in the future.   Screening recommendations/referrals: Colonoscopy: 01/2018 Mammogram: 12/2017 Bone Density: 02/2014 Recommended yearly ophthalmology/optometry visit for glaucoma screening and checkup Recommended yearly dental visit for hygiene and checkup  Vaccinations: Influenza vaccine: 12/2017 Pneumococcal vaccine: 10/2016 Tdap vaccine: 12/2017 Shingles vaccine: dec    Advanced directives: Advance directive discussed with you today. I have provided a copy for you to complete at home and have notarized. Once this is complete please bring a copy in to our office so we can scan it into your chart.   Conditions/risks identified: Overweight  Next appointment: 02/07/2018 at 2:45   Preventive Care 55 Years and Older, Female Preventive care refers to lifestyle choices and visits with your health care provider that can promote health and wellness. What does preventive care include?  A yearly physical exam. This is also called an annual well check.  Dental exams once or twice a year.  Routine eye exams. Ask your health care provider how often you should have your eyes checked.  Personal lifestyle choices, including:  Daily care of your teeth and gums.  Regular physical activity.  Eating a healthy diet.  Avoiding tobacco and drug use.  Limiting alcohol use.  Practicing safe sex.  Taking low-dose aspirin every day.  Taking vitamin and mineral supplements as recommended by your health care provider. What happens during an annual well check? The services and screenings done by your health care provider during your annual well check will depend on your age, overall health, lifestyle risk factors, and family history of disease. Counseling  Your health care  provider may ask you questions about your:  Alcohol use.  Tobacco use.  Drug use.  Emotional well-being.  Home and relationship well-being.  Sexual activity.  Eating habits.  History of falls.  Memory and ability to understand (cognition).  Work and work Statistician.  Reproductive health. Screening  You may have the following tests or measurements:  Height, weight, and BMI.  Blood pressure.  Lipid and cholesterol levels. These may be checked every 5 years, or more frequently if you are over 23 years old.  Skin check.  Lung cancer screening. You may have this screening every year starting at age 82 if you have a 30-pack-year history of smoking and currently smoke or have quit within the past 15 years.  Fecal occult blood test (FOBT) of the stool. You may have this test every year starting at age 82.  Flexible sigmoidoscopy or colonoscopy. You may have a sigmoidoscopy every 5 years or a colonoscopy every 10 years starting at age 82.  Hepatitis C blood test.  Hepatitis B blood test.  Sexually transmitted disease (STD) testing.  Diabetes screening. This is done by checking your blood sugar (glucose) after you have not eaten for a while (fasting). You may have this done every 1-3 years.  Bone density scan. This is done to screen for osteoporosis. You may have this done starting at age 82.  Mammogram. This may be done every 1-2 years. Talk to your health care provider about how often you should have regular mammograms. Talk with your health care provider about your test results, treatment options, and if necessary, the need for more tests. Vaccines  Your health care provider may recommend certain vaccines, such as:  Influenza vaccine. This is recommended every year.  Tetanus, diphtheria, and acellular pertussis (Tdap, Td) vaccine. You may need a Td booster every 10 years.  Zoster vaccine. You may need this after age 82.  Pneumococcal 13-valent conjugate (PCV13)  vaccine. One dose is recommended after age 82.  Pneumococcal polysaccharide (PPSV23) vaccine. One dose is recommended after age 82. Talk to your health care provider about which screenings and vaccines you need and how often you need them. This information is not intended to replace advice given to you by your health care provider. Make sure you discuss any questions you have with your health care provider. Document Released: 03/20/2015 Document Revised: 11/11/2015 Document Reviewed: 12/23/2014 Elsevier Interactive Patient Education  2017 [place] Prevention in the Home Falls can cause injuries. They can happen to people of all ages. There are many things you can do to make your home safe and to help prevent falls. What can I do on the outside of my home?  Regularly fix the edges of walkways and driveways and fix any cracks.  Remove anything that might make you trip as you walk through a door, such as a raised step or threshold.  Trim any bushes or trees on the path to your home.  Use bright outdoor lighting.  Clear any walking paths of anything that might make someone trip, such as rocks or tools.  Regularly check to see if handrails are loose or broken. Make sure that both sides of any steps have handrails.  Any raised decks and porches should have guardrails on the edges.  Have any leaves, snow, or ice cleared regularly.  Use sand or salt on walking paths during winter.  Clean up any spills in your garage right away. This includes oil or grease spills. What can I do in the bathroom?  Use night lights.  Install grab bars by the toilet and in the tub and shower. Do not use towel bars as grab bars.  Use non-skid mats or decals in the tub or shower.  If you need to sit down in the shower, use a plastic, non-slip stool.  Keep the floor dry. Clean up any water that spills on the floor as soon as it happens.  Remove soap buildup in the tub or shower  regularly.  Attach bath mats securely with double-sided non-slip rug tape.  Do not have throw rugs and other things on the floor that can make you trip. What can I do in the bedroom?  Use night lights.  Make sure that you have a light by your bed that is easy to reach.  Do not use any sheets or blankets that are too big for your bed. They should not hang down onto the floor.  Have a firm chair that has side arms. You can use this for support while you get dressed.  Do not have throw rugs and other things on the floor that can make you trip. What can I do in the kitchen?  Clean up any spills right away.  Avoid walking on wet floors.  Keep items that you use a lot in easy-to-reach places.  If you need to reach something above you, use a strong step stool that has a grab bar.  Keep electrical cords out of the way.  Do not use floor polish or wax that makes floors slippery. If you must use wax, use non-skid floor wax.  Do not have throw rugs and other things on the floor that  can make you trip. What can I do with my stairs?  Do not leave any items on the stairs.  Make sure that there are handrails on both sides of the stairs and use them. Fix handrails that are broken or loose. Make sure that handrails are as long as the stairways.  Check any carpeting to make sure that it is firmly attached to the stairs. Fix any carpet that is loose or worn.  Avoid having throw rugs at the top or bottom of the stairs. If you do have throw rugs, attach them to the floor with carpet tape.  Make sure that you have a light switch at the top of the stairs and the bottom of the stairs. If you do not have them, ask someone to add them for you. What else can I do to help prevent falls?  Wear shoes that:  Do not have high heels.  Have rubber bottoms.  Are comfortable and fit you well.  Are closed at the toe. Do not wear sandals.  If you use a stepladder:  Make sure that it is fully  opened. Do not climb a closed stepladder.  Make sure that both sides of the stepladder are locked into place.  Ask someone to hold it for you, if possible.  Clearly mark and make sure that you can see:  Any grab bars or handrails.  First and last steps.  Where the edge of each step is.  Use tools that help you move around (mobility aids) if they are needed. These include:  Canes.  Walkers.  Scooters.  Crutches.  Turn on the lights when you go into a dark area. Replace any light bulbs as soon as they burn out.  Set up your furniture so you have a clear path. Avoid moving your furniture around.  If any of your floors are uneven, fix them.  If there are any pets around you, be aware of where they are.  Review your medicines with your doctor. Some medicines can make you feel dizzy. This can increase your chance of falling. Ask your doctor what other things that you can do to help prevent falls. This information is not intended to replace advice given to you by your health care provider. Make sure you discuss any questions you have with your health care provider. Document Released: 12/18/2008 Document Revised: 07/30/2015 Document Reviewed: 03/28/2014 Elsevier Interactive Patient Education  2017 [employer].

## 2018-02-07 NOTE — Progress Notes (Signed)
Subjective:     Patient ID: Leah Wiggins , female    DOB: 1926/07/05 , 84 y.o.   MRN: [id number]   Chief Complaint  Patient presents with  . Diabetes    HPI  Diabetes  She presents for her follow-up diabetic visit. She has type 2 diabetes mellitus. Her disease course has been stable. Pertinent negatives for hypoglycemia include no dizziness or nervousness/anxiousness. Pertinent negatives for diabetes include no blurred vision, no polydipsia, no polyphagia and no polyuria. Symptoms are stable. Her weight is stable. She is following a generally unhealthy diet. When asked about meal planning, she reported none. She has not had a previous visit with a dietitian. She participates in exercise intermittently. Her overall blood glucose range is 90-110 mg/dl. She sees a podiatrist Landscape architect).    Past Medical History:  Diagnosis Date  . Arthritis    "shoulders" (05/04/2017)  . Breast cancer, right breast ([place]) 1991  . GERD (gastroesophageal reflux disease)   . Hyperlipidemia   . Hypertension   . Non-stress test nonreactive, 04/24/12, normal 12/10/2012  . OSA on CPAP    "don't wear it all the time" (05/04/2017)  . PVD (peripheral vascular disease) ([place])   . Tobacco use 12/10/2012  . Type II diabetes mellitus ([facility])      Family History  Problem Relation Age of Onset  . Heart disease Mother   . Stroke Mother   . Stroke Father   . Cancer Sister        GIST  . Cancer Brother        thyroid   . Diabetes Son   . Stroke Son      Current Outpatient Medications:  .  alendronate (FOSAMAX) 70 MG tablet, Take 70 mg by mouth every Wednesday. Take with a full glass of water on an empty stomach, Disp: , Rfl:  .  aspirin EC 81 MG tablet, Take 81 mg by mouth daily., Disp: , Rfl:  .  clotrimazole-betamethasone (LOTRISONE) cream, Apply to affected area 2 times daily prn, Disp: 30 g, Rfl: 0 .  EPINEPHrine 0.3 mg/0.3 mL IJ SOAJ injection, Inject 0.3 mLs (0.3 mg total) into the muscle once  as needed (prn worsening angioedema). (Patient not taking: Reported on 01/22/2018), Disp: 1 Device, Rfl: 0 .  ferrous sulfate 325 (65 FE) MG EC tablet, Take 1 tablet (325 mg total) by mouth 3 (three) times daily with meals., Disp: 90 tablet, Rfl: 2 .  metFORMIN (GLUCOPHAGE-XR) 500 MG 24 hr tablet, Take 500 mg by mouth 2 (two) times daily., Disp: , Rfl:  .  MICARDIS 40 MG tablet, Take 40 mg by mouth daily., Disp: , Rfl:  .  mometasone (ELOCON) 0.1 % lotion, Apply 1 application topically daily as needed (IRRITATION). , Disp: , Rfl: 0 .  mupirocin ointment (BACTROBAN) 2 %, 1 application as needed. , Disp: , Rfl: 0 .  pantoprazole (PROTONIX) 40 MG tablet, Take 1 tablet (40 mg total) by mouth daily at 12 noon., Disp: 30 tablet, Rfl: 0 .  pravastatin (PRAVACHOL) 40 MG tablet, Take 40 mg by mouth daily. , Disp: , Rfl:  .  tiotropium (SPIRIVA) 18 MCG inhalation capsule, Place 18 mcg into inhaler and inhale daily. , Disp: , Rfl:    Allergies  Allergen Reactions  . Bactrim [Sulfamethoxazole-Trimethoprim] Swelling    Marked angioedema requiring hospitalization  . Penicillins Other (See Comments)    Has patient had a PCN reaction causing immediate rash, facial/tongue/throat swelling, SOB or lightheadedness with hypotension:  Unk Has patient had a PCN reaction causing severe rash involving mucus membranes or skin necrosis: Unk Has patient had a PCN reaction that required hospitalization: Unk Has patient had a PCN reaction occurring within the last 10 years: No If all of the above answers are "NO", then may proceed with Cephalosporin use.     Review of Systems  Constitutional: Negative.   HENT: Negative.   Eyes: Negative.  Negative for blurred vision.  Respiratory: Negative.   Cardiovascular: Negative.   Gastrointestinal: Negative.   Endocrine: Negative.  Negative for polydipsia, polyphagia and polyuria.  Genitourinary: Negative.   Musculoskeletal: Negative.   Skin: Negative.    Allergic/Immunologic: Negative.   Neurological: Negative.  Negative for dizziness.  Hematological: Negative.   Psychiatric/Behavioral: Negative.  The patient is not nervous/anxious.      Today's Vitals   02/07/18 1442  BP: (!) 148/82  Pulse: 74  Temp: 98.6 F (37 C)  TempSrc: Oral  SpO2: 95%  Weight: 163 lb 12.8 oz (74.3 kg)  PainSc: 0-No pain   Body mass index is 29.02 kg/m.   Objective:  Physical Exam  Constitutional: She is oriented to person, place, and time. Vital signs are normal. She appears well-developed and well-nourished.  HENT:  Head: Normocephalic and atraumatic.  Right Ear: Hearing, tympanic membrane, external ear and ear canal normal.  Left Ear: Hearing, tympanic membrane, external ear and ear canal normal.  Nose: Nose normal.  Mouth/Throat: Oropharynx is clear and moist.  Eyes: Pupils are equal, round, and reactive to light. Conjunctivae, EOM and lids are normal.  Fundoscopic exam:      The right eye shows no papilledema.       The left eye shows no papilledema.  Neck: Normal range of motion and full passive range of motion without pain. Neck supple. Carotid bruit is not present. No thyroid mass present.  Cardiovascular: Normal rate, regular rhythm, normal heart sounds, intact distal pulses and normal pulses.  No murmur heard. Pulmonary/Chest: Effort normal and breath sounds normal.  Abdominal: Soft. Bowel sounds are normal.  Musculoskeletal: Normal range of motion.  Neurological: She is alert and oriented to person, place, and time. She has normal strength. No cranial nerve deficit or sensory deficit.  Skin: Skin is warm and dry. Capillary refill takes less than 2 seconds.  Psychiatric: She has a normal mood and affect. Her behavior is normal. Judgment and thought content normal.  Vitals reviewed.       Assessment And Plan:     1. Health maintenance examination . Behavior modifications discussed and diet history reviewed.   . Pt will continue to  exercise regularly and modify diet with low GI, plant based foods and decrease intake of processed foods.  . Recommend intake of daily multivitamin, Vitamin D, and calcium.  . Recommend mammogram (up to date) and colonoscopy (done 11/18) for preventive screenings, as well as recommend immunizations that include influenza, TDAP, and Shingles   2. Prediabetes  Chronic, controlled  Continue with current medications  Encouraged to limit intake of sugary foods and drinks  Encouraged to increase physical activity to 150 minutes per week 3. Essential hypertension . B/P is controlled.  . The importance of regular exercise and dietary modification was stressed to the patient.  . Stressed importance of losing ten percent of her body weight to help with B/P control.  . The weight loss would help with decreasing cardiac        Minette Brine, FNP

## 2018-02-12 ENCOUNTER — Telehealth: Payer: Self-pay | Admitting: Hematology and Oncology

## 2018-02-12 NOTE — Telephone Encounter (Signed)
RR out - moved 12/16 f/u to [place]. Not able to reach patient re time change. Schedule mailed.

## 2018-02-19 ENCOUNTER — Ambulatory Visit: Payer: Medicare Other | Admitting: Nurse Practitioner

## 2018-02-19 ENCOUNTER — Other Ambulatory Visit: Payer: Medicare Other

## 2018-02-20 DIAGNOSIS — K219 Gastro-esophageal reflux disease without esophagitis: Secondary | ICD-10-CM | POA: Diagnosis not present

## 2018-02-20 DIAGNOSIS — D509 Iron deficiency anemia, unspecified: Secondary | ICD-10-CM | POA: Diagnosis not present

## 2018-02-20 DIAGNOSIS — K5520 Angiodysplasia of colon without hemorrhage: Secondary | ICD-10-CM | POA: Diagnosis not present

## 2018-02-21 ENCOUNTER — Encounter: Payer: Self-pay | Admitting: Nurse Practitioner

## 2018-02-26 DIAGNOSIS — B351 Tinea unguium: Secondary | ICD-10-CM | POA: Diagnosis not present

## 2018-02-26 DIAGNOSIS — E1151 Type 2 diabetes mellitus with diabetic peripheral angiopathy without gangrene: Secondary | ICD-10-CM | POA: Diagnosis not present

## 2018-02-26 DIAGNOSIS — I739 Peripheral vascular disease, unspecified: Secondary | ICD-10-CM | POA: Diagnosis not present

## 2018-03-19 DIAGNOSIS — L299 Pruritus, unspecified: Secondary | ICD-10-CM | POA: Diagnosis not present

## 2018-03-19 DIAGNOSIS — L2084 Intrinsic (allergic) eczema: Secondary | ICD-10-CM | POA: Diagnosis not present

## 2018-03-19 DIAGNOSIS — L853 Xerosis cutis: Secondary | ICD-10-CM | POA: Diagnosis not present

## 2018-04-06 ENCOUNTER — Ambulatory Visit (INDEPENDENT_AMBULATORY_CARE_PROVIDER_SITE_OTHER): Payer: Medicare Other | Admitting: Nurse Practitioner

## 2018-04-06 ENCOUNTER — Encounter: Payer: Self-pay | Admitting: Nurse Practitioner

## 2018-04-06 VITALS — BP 112/68 | HR 74 | Temp 98.3°F | Ht 59.6 in | Wt 162.8 lb

## 2018-04-06 DIAGNOSIS — J449 Chronic obstructive pulmonary disease, unspecified: Secondary | ICD-10-CM | POA: Diagnosis not present

## 2018-04-06 DIAGNOSIS — J439 Emphysema, unspecified: Secondary | ICD-10-CM | POA: Insufficient documentation

## 2018-04-06 NOTE — Progress Notes (Addendum)
Subjective:     Patient ID: Leah Wiggins , female    DOB: 05-23-1926 , 85 y.o.   MRN: [id number]   Chief Complaint  Patient presents with  . MED CHECK    Pt is unable to use spiriva and would like a different medication.    HPI  She needs education on her inhaler (respimat) Cough  This is a chronic problem. The current episode started more than 1 year ago. The problem has been unchanged. The problem occurs constantly. The cough is non-productive. Pertinent negatives include no chest pain, headaches or shortness of breath. She has tried leukotriene antagonists for the symptoms. The treatment provided moderate relief. Her past medical history is significant for COPD.     Past Medical History:  Diagnosis Date  . Arthritis    "shoulders" (05/04/2017)  . Breast cancer, right breast ([place]) 1991  . GERD (gastroesophageal reflux disease)   . Hyperlipidemia   . Hypertension   . Non-stress test nonreactive, 04/24/12, normal 12/10/2012  . OSA on CPAP    "don't wear it all the time" (05/04/2017)  . PVD (peripheral vascular disease) ([place])   . Tobacco use 12/10/2012  . Type II diabetes mellitus ([facility])      Family History  Problem Relation Age of Onset  . Heart disease Mother   . Stroke Mother   . Stroke Father   . Cancer Sister        GIST  . Cancer Brother        thyroid   . Diabetes Son   . Stroke Son      Current Outpatient Medications:  .  alendronate (FOSAMAX) 70 MG tablet, Take 70 mg by mouth every Wednesday. Take with a full glass of water on an empty stomach, Disp: , Rfl:  .  aspirin EC 81 MG tablet, Take 81 mg by mouth daily., Disp: , Rfl:  .  benzonatate (TESSALON PERLES) 100 MG capsule, Take 1 capsule (100 mg total) by mouth every 6 (six) hours as needed for cough., Disp: 30 capsule, Rfl: 1 .  clotrimazole-betamethasone (LOTRISONE) cream, Apply to affected area 2 times daily prn, Disp: 30 g, Rfl: 0 .  metFORMIN (GLUCOPHAGE-XR) 500 MG 24 hr tablet, Take 500 mg by  mouth 2 (two) times daily., Disp: , Rfl:  .  MICARDIS 40 MG tablet, Take 40 mg by mouth daily., Disp: , Rfl:  .  mometasone (ELOCON) 0.1 % lotion, Apply 1 application topically daily as needed (IRRITATION). , Disp: , Rfl: 0 .  mupirocin ointment (BACTROBAN) 2 %, 1 application as needed. , Disp: , Rfl: 0 .  pantoprazole (PROTONIX) 40 MG tablet, Take 1 tablet (40 mg total) by mouth daily at 12 noon., Disp: 30 tablet, Rfl: 0 .  pravastatin (PRAVACHOL) 40 MG tablet, Take 40 mg by mouth daily. , Disp: , Rfl:  .  EPINEPHrine 0.3 mg/0.3 mL IJ SOAJ injection, Inject 0.3 mLs (0.3 mg total) into the muscle once as needed (prn worsening angioedema). (Patient not taking: Reported on 01/22/2018), Disp: 1 Device, Rfl: 0 .  ferrous sulfate 325 (65 FE) MG EC tablet, Take 1 tablet (325 mg total) by mouth 3 (three) times daily with meals., Disp: 90 tablet, Rfl: 2 .  tiotropium (SPIRIVA) 18 MCG inhalation capsule, Place 18 mcg into inhaler and inhale daily. , Disp: , Rfl:    Allergies  Allergen Reactions  . Bactrim [Sulfamethoxazole-Trimethoprim] Swelling    Marked angioedema requiring hospitalization  . Penicillins Other (See Comments)  Has patient had a PCN reaction causing immediate rash, facial/tongue/throat swelling, SOB or lightheadedness with hypotension: Unk Has patient had a PCN reaction causing severe rash involving mucus membranes or skin necrosis: Unk Has patient had a PCN reaction that required hospitalization: Unk Has patient had a PCN reaction occurring within the last 10 years: No If all of the above answers are "NO", then may proceed with Cephalosporin use.     Review of Systems  Constitutional: Negative.  Negative for fatigue.  HENT: Negative for congestion.   Eyes: Negative for visual disturbance.  Respiratory: Positive for cough. Negative for choking, chest tightness and shortness of breath.   Cardiovascular: Negative.  Negative for chest pain, palpitations and leg swelling.   Gastrointestinal: Negative.   Endocrine: Negative.   Musculoskeletal: Negative.   Skin: Negative.   Neurological: Negative for dizziness, weakness and headaches.  Psychiatric/Behavioral: Negative for confusion. The patient is not nervous/anxious.      Today's Vitals   04/06/18 1537  BP: 112/68  Pulse: 74  Temp: 98.3 F (36.8 C)  TempSrc: Oral  SpO2: 96%  Weight: 162 lb 12.8 oz (73.8 kg)  Height: 4' 11.6" (1.514 m)  PainSc: 0-No pain   Body mass index is 32.22 kg/m.   Objective:  Physical Exam Neck:     Musculoskeletal: Normal range of motion.  Cardiovascular:     Rate and Rhythm: Normal rate and regular rhythm.  Pulmonary:     Effort: Pulmonary effort is normal. No respiratory distress.     Breath sounds: Normal breath sounds.  Neurological:     General: No focal deficit present.     Mental Status: She is oriented to person, place, and time.  Psychiatric:        Mood and Affect: Mood normal.         Assessment And Plan:     1. Chronic obstructive pulmonary disease, unspecified COPD type ([place])  spiriva respimat instructions given and demonstrated multiple times with her granddaughter present.  This is the best device due to her limitations with her hand.  She is to try over the next month if she continues to have problems she is to call back to the office.      Minette Brine, FNP

## 2018-05-04 ENCOUNTER — Telehealth: Payer: Self-pay

## 2018-05-04 NOTE — Telephone Encounter (Signed)
The patient called and left a message to tell Dr. Baird Cancer that she got her oxygen machine from [facility] and to tell her thank you.

## 2018-05-05 ENCOUNTER — Encounter (HOSPITAL_COMMUNITY): Payer: Self-pay

## 2018-05-05 ENCOUNTER — Other Ambulatory Visit: Payer: Self-pay

## 2018-05-05 ENCOUNTER — Emergency Department (HOSPITAL_COMMUNITY): Payer: Medicare Other

## 2018-05-05 ENCOUNTER — Emergency Department (HOSPITAL_COMMUNITY)
Admission: EM | Admit: 2018-05-05 | Discharge: 2018-05-05 | Disposition: A | Discharge status: Home / Self Care | Payer: Medicare Other | Attending: Emergency Medicine | Admitting: Emergency Medicine

## 2018-05-05 DIAGNOSIS — I10 Essential (primary) hypertension: Secondary | ICD-10-CM | POA: Insufficient documentation

## 2018-05-05 DIAGNOSIS — Y92512 Supermarket, store or market as the place of occurrence of the external cause: Secondary | ICD-10-CM | POA: Insufficient documentation

## 2018-05-05 DIAGNOSIS — Y999 Unspecified external cause status: Secondary | ICD-10-CM | POA: Insufficient documentation

## 2018-05-05 DIAGNOSIS — Z79899 Other long term (current) drug therapy: Secondary | ICD-10-CM | POA: Insufficient documentation

## 2018-05-05 DIAGNOSIS — F17210 Nicotine dependence, cigarettes, uncomplicated: Secondary | ICD-10-CM | POA: Insufficient documentation

## 2018-05-05 DIAGNOSIS — E853 Secondary systemic amyloidosis: Secondary | ICD-10-CM | POA: Insufficient documentation

## 2018-05-05 DIAGNOSIS — W19XXXA Unspecified fall, initial encounter: Secondary | ICD-10-CM

## 2018-05-05 DIAGNOSIS — Z7984 Long term (current) use of oral hypoglycemic drugs: Secondary | ICD-10-CM | POA: Diagnosis not present

## 2018-05-05 DIAGNOSIS — S4991XA Unspecified injury of right shoulder and upper arm, initial encounter: Secondary | ICD-10-CM | POA: Diagnosis present

## 2018-05-05 DIAGNOSIS — Z7982 Long term (current) use of aspirin: Secondary | ICD-10-CM | POA: Insufficient documentation

## 2018-05-05 DIAGNOSIS — W1830XA Fall on same level, unspecified, initial encounter: Secondary | ICD-10-CM | POA: Insufficient documentation

## 2018-05-05 DIAGNOSIS — E119 Type 2 diabetes mellitus without complications: Secondary | ICD-10-CM | POA: Insufficient documentation

## 2018-05-05 DIAGNOSIS — I972 Postmastectomy lymphedema syndrome: Secondary | ICD-10-CM | POA: Insufficient documentation

## 2018-05-05 DIAGNOSIS — S42201A Unspecified fracture of upper end of right humerus, initial encounter for closed fracture: Secondary | ICD-10-CM | POA: Diagnosis not present

## 2018-05-05 DIAGNOSIS — M25511 Pain in right shoulder: Secondary | ICD-10-CM | POA: Diagnosis not present

## 2018-05-05 DIAGNOSIS — Y939 Activity, unspecified: Secondary | ICD-10-CM | POA: Insufficient documentation

## 2018-05-05 DIAGNOSIS — I739 Peripheral vascular disease, unspecified: Secondary | ICD-10-CM | POA: Diagnosis not present

## 2018-05-05 MED ORDER — HYDROCODONE-ACETAMINOPHEN 5-325 MG PO TABS: 2.0000 | ORAL_TABLET | ORAL | 0 refills | Status: DC | PRN

## 2018-05-05 MED ORDER — HYDROCODONE-ACETAMINOPHEN 5-325 MG PO TABS: 1.0000 | ORAL_TABLET | ORAL | 0 refills | Status: DC | PRN

## 2018-05-05 NOTE — Progress Notes (Signed)
Orthopedic Tech Progress Note Patient Details:  Leah Wiggins 1926/08/27 [id number] She did very well, had to talk to her daughter over the phone letting her know mom is going to need help with taking the sling on and off. Ortho Devices Type of Ortho Device: Shoulder immobilizer Ortho Device/Splint Location: right arm Ortho Device/Splint Interventions: Adjustment, Ordered, Application   Post Interventions Patient Tolerated: Well Instructions Provided: Care of device, Adjustment of device   Janit Pagan 05/05/2018, 3:46 PM

## 2018-05-05 NOTE — Discharge Instructions (Addendum)
Return if any problems.

## 2018-05-05 NOTE — ED Triage Notes (Signed)
Pt arrives to ED with complaints of right shoulder pain after tripping on the curb while walking in to the grocery store to buy a bag of potatoes. Pt denies LOC, arrives with right arm in makeshift sling for comfort.

## 2018-05-05 NOTE — ED Notes (Signed)
Patient verbalizes understanding of discharge instructions. Opportunity for questioning and answers were provided. Armband removed by staff, pt discharged from ED with family.  

## 2018-05-05 NOTE — ED Provider Notes (Signed)
[facility] EMERGENCY DEPARTMENT Provider Note   CSN: [id number] Arrival date & time: 05/05/18  1407    History   Chief Complaint Chief Complaint  Patient presents with  . Shoulder Injury  . Fall    HPI Leah Wiggins is a 85 y.o. female.     The history is provided by the patient. No language interpreter was used.  Shoulder Injury  This is a new problem. The current episode started 1 to 2 hours ago. The problem occurs constantly. The problem has been gradually worsening. Nothing aggravates the symptoms. Nothing relieves the symptoms. She has tried nothing for the symptoms. The treatment provided no relief.  Fall    Pt tripped over a corner at grocery store.  Pt hit her right shoulder.  Pt reports she has lymphedema in her right arm from breast cancer Past Medical History:  Diagnosis Date  . Arthritis    "shoulders" (05/04/2017)  . Breast cancer, right breast ([place]) 1991  . GERD (gastroesophageal reflux disease)   . Hyperlipidemia   . Hypertension   . Non-stress test nonreactive, 04/24/12, normal 12/10/2012  . OSA on CPAP    "don't wear it all the time" (05/04/2017)  . PVD (peripheral vascular disease) ([place])   . Tobacco use 12/10/2012  . Type II diabetes mellitus [facility])     Patient Active Problem List   Diagnosis Date Noted  . Chronic obstructive pulmonary disease ([place]) 04/06/2018  . Prediabetes 02/07/2018  . Iron deficiency anemia due to chronic blood loss 12/25/2017  . Anaphylaxis 05/04/2017  . Rash and nonspecific skin eruption 05/04/2017  . Chronic pancreatitis ([place]) 09/08/2016  . History of breast cancer 08/14/2014  . Empty sella ([place]) 07/14/2014  . Pain in the chest 04/28/2014  . Diabetes mellitus without complication ([place]) 47/82/9562  . Chest pain 03/03/2014  . Essential hypertension 06/24/2013  . PAD (peripheral artery disease), abnormal ABIs 12/10/2012  . Non-stress test nonreactive, 04/24/12, normal 12/10/2012  . Dyslipidemia  12/10/2012  . Tobacco use 12/10/2012  . Thrombocytopenia ([place]) 01/24/2012  . RESTLESS LEG SYNDROME 02/16/2010  . METHICILLIN SUSECPTIBLE PNEUMONIA STAPH AUREUS 01/13/2010  . SEPTIC ARTHRITIS 01/13/2010  . HIP THI LEG&ANK ABRASION/FRICION BURN W/O INF 01/13/2010    Past Surgical History:  Procedure Laterality Date  . APPENDECTOMY    . BIOPSY  01/19/2018   Procedure: BIOPSY;  Surgeon: Carol Ada, MD;  Location: [facility] ENDOSCOPY;  Service: Endoscopy;;  . BREAST BIOPSY Right 1991  . CARDIOVASCULAR STRESS TEST  04/24/2012   No significant ST segment change suggestive of ischemia.  Marland Kitchen CATARACT EXTRACTION, BILATERAL Bilateral   . COLONOSCOPY WITH PROPOFOL N/A 01/19/2018   Procedure: COLONOSCOPY WITH PROPOFOL;  Surgeon: Carol Ada, MD;  Location: [facility] ENDOSCOPY;  Service: Endoscopy;  Laterality: N/A;  . ESOPHAGOGASTRODUODENOSCOPY (EGD) WITH PROPOFOL N/A 01/19/2018   Procedure: ESOPHAGOGASTRODUODENOSCOPY (EGD) WITH PROPOFOL;  Surgeon: Carol Ada, MD;  Location: [facility] ENDOSCOPY;  Service: Endoscopy;  Laterality: N/A;  . JOINT REPLACEMENT    . LOWER EXTREMITY ARTERIAL DOPPLER Bilateral 05/07/2012   Left ABI-demonstrated mild arterial insufficiency. Right CIA 50-69% diameter reduction. Right SFA less than 50% diameter reduction. Left SFA 70-99% diameter reduction. Bilateral Runoff-Posterior tibial arteries appeared occluded.  Marland Kitchen MASTECTOMY PARTIAL / LUMPECTOMY W/ AXILLARY LYMPHADENECTOMY Right 1991   . TOTAL HIP ARTHROPLASTY Right 2011     OB History   No obstetric history on file.      Home Medications    Prior to Admission medications   Medication Sig Start  Date End Date Taking? Authorizing Provider  alendronate (FOSAMAX) 70 MG tablet Take 70 mg by mouth every Wednesday. Take with a full glass of water on an empty stomach    [provider]  aspirin EC 81 MG tablet Take 81 mg by mouth daily.    [provider]  benzonatate (TESSALON PERLES) 100 MG capsule Take 1 capsule  (100 mg total) by mouth every 6 (six) hours as needed for cough. 02/07/18 02/07/19  Minette Brine, FNP  clotrimazole-betamethasone (LOTRISONE) cream Apply to affected area 2 times daily prn 10/31/15   Janne Napoleon, NP  EPINEPHrine 0.3 mg/0.3 mL IJ SOAJ injection Inject 0.3 mLs (0.3 mg total) into the muscle once as needed (prn worsening angioedema). Patient not taking: Reported on 01/22/2018 05/05/17   Nita Sells, MD  ferrous sulfate 325 (65 FE) MG EC tablet Take 1 tablet (325 mg total) by mouth 3 (three) times daily with meals. 12/25/17 02/23/18  Henreitta Leber, MD  metFORMIN (GLUCOPHAGE-XR) 500 MG 24 hr tablet Take 500 mg by mouth 2 (two) times daily.    [provider]  MICARDIS 40 MG tablet Take 40 mg by mouth daily. 04/17/17   [provider]  mometasone (ELOCON) 0.1 % lotion Apply 1 application topically daily as needed (IRRITATION).  04/15/14   [provider]  mupirocin ointment (BACTROBAN) 2 % 1 application as needed.  04/11/17   [provider]  pantoprazole (PROTONIX) 40 MG tablet Take 1 tablet (40 mg total) by mouth daily at 12 noon. 03/04/14   Elgergawy, Silver Huguenin, MD  pravastatin (PRAVACHOL) 40 MG tablet Take 40 mg by mouth daily.  11/10/15   [provider]  tiotropium (SPIRIVA) 18 MCG inhalation capsule Place 18 mcg into inhaler and inhale daily.  11/05/11   [provider]    Family History Family History  Problem Relation Age of Onset  . Heart disease Mother   . Stroke Mother   . Stroke Father   . Cancer Sister        GIST  . Cancer Brother        thyroid   . Diabetes Son   . Stroke Son     Social History Social History   Tobacco Use  . Smoking status: Current Some Day Smoker    Packs/day: 0.25    Years: 70.00    Pack years: 17.50    Types: Cigarettes    Start date: 03/08/1947  . Smokeless tobacco: Never Used  . Tobacco comment: has tried cold [place]  Substance Use Topics  . Alcohol use: No    Alcohol/week:  0.0 standard drinks  . Drug use: No     Allergies   Bactrim [sulfamethoxazole-trimethoprim] and Penicillins   Review of Systems Review of Systems  All other systems reviewed and are negative.    Physical Exam Updated Vital Signs BP (!) 155/70 (BP Location: Left Arm)   Pulse 77   Temp 98.9 F (37.2 C) (Oral)   Resp 17   Ht 5\' 3"  (1.6 m)   Wt 74.4 kg   SpO2 97%   BMI 29.05 kg/m   Physical Exam Vitals signs and nursing note reviewed.  Constitutional:      Appearance: She is normal weight.  HENT:     Head: Normocephalic.     Mouth/Throat:     Mouth: Mucous membranes are moist.  Eyes:     Pupils: Pupils are equal, round, and reactive to light.  Neck:  Musculoskeletal: Normal range of motion.  Cardiovascular:     Rate and Rhythm: Normal rate.  Pulmonary:     Effort: Pulmonary effort is normal.  Musculoskeletal:        General: Swelling present.     Comments: Swollen right arm. Tender to palpation, nv and ns intact   Skin:    General: Skin is warm.  Neurological:     General: No focal deficit present.     Mental Status: She is alert.  Psychiatric:        Mood and Affect: Mood normal.      ED Treatments / Results  Labs (all labs ordered are listed, but only abnormal results are displayed) Labs Reviewed - No data to display  EKG None  Radiology No results found.  Procedures Procedures (including critical care time)  Medications Ordered in ED Medications - No data to display   Initial Impression / Assessment and Plan / ED Course  I have reviewed the triage vital signs and the nursing notes.  Pertinent labs & imaging results that were available during my care of the patient were reviewed by me and considered in my medical decision making (see chart for details).          Final Clinical Impressions(s) / ED Diagnoses   Final diagnoses:  Fall, initial encounter  Closed fracture of proximal end of right humerus, unspecified fracture  morphology, initial encounter    ED Discharge Orders         Ordered    HYDROcodone-acetaminophen (NORCO/VICODIN) 5-325 MG tablet  Every 4 hours PRN     05/05/18 1523        An After Visit Summary was printed and given to the patient.    Fransico Meadow, [place] 05/05/18 [place], Wenda Overland, MD 05/06/18 [phone]

## 2018-05-07 DIAGNOSIS — M79671 Pain in right foot: Secondary | ICD-10-CM | POA: Diagnosis not present

## 2018-05-07 DIAGNOSIS — M79672 Pain in left foot: Secondary | ICD-10-CM | POA: Diagnosis not present

## 2018-05-07 DIAGNOSIS — B351 Tinea unguium: Secondary | ICD-10-CM | POA: Diagnosis not present

## 2018-05-07 DIAGNOSIS — I739 Peripheral vascular disease, unspecified: Secondary | ICD-10-CM | POA: Diagnosis not present

## 2018-05-08 DIAGNOSIS — M25531 Pain in right wrist: Secondary | ICD-10-CM | POA: Diagnosis not present

## 2018-05-11 ENCOUNTER — Other Ambulatory Visit: Payer: Self-pay

## 2018-05-11 ENCOUNTER — Encounter: Payer: Self-pay | Admitting: Internal Medicine

## 2018-05-11 ENCOUNTER — Ambulatory Visit (INDEPENDENT_AMBULATORY_CARE_PROVIDER_SITE_OTHER): Payer: Medicare Other | Admitting: Internal Medicine

## 2018-05-11 VITALS — BP 128/74 | HR 84 | Temp 98.3°F | Ht 63.0 in | Wt 163.0 lb

## 2018-05-11 DIAGNOSIS — E2839 Other primary ovarian failure: Secondary | ICD-10-CM | POA: Diagnosis not present

## 2018-05-11 DIAGNOSIS — H9193 Unspecified hearing loss, bilateral: Secondary | ICD-10-CM

## 2018-05-11 DIAGNOSIS — Z6828 Body mass index (BMI) 28.0-28.9, adult: Secondary | ICD-10-CM

## 2018-05-11 DIAGNOSIS — Z09 Encounter for follow-up examination after completed treatment for conditions other than malignant neoplasm: Secondary | ICD-10-CM | POA: Diagnosis not present

## 2018-05-11 DIAGNOSIS — E663 Overweight: Secondary | ICD-10-CM

## 2018-05-11 DIAGNOSIS — W101XXD Fall (on)(from) sidewalk curb, subsequent encounter: Secondary | ICD-10-CM | POA: Diagnosis not present

## 2018-05-11 DIAGNOSIS — S4291XD Fracture of right shoulder girdle, part unspecified, subsequent encounter for fracture with routine healing: Secondary | ICD-10-CM

## 2018-05-11 DIAGNOSIS — R413 Other amnesia: Secondary | ICD-10-CM | POA: Diagnosis not present

## 2018-05-11 MED ORDER — PANTOPRAZOLE SODIUM 40 MG PO TBEC: 40.0000 mg | DELAYED_RELEASE_TABLET | Freq: Every day | ORAL | 2 refills | Status: DC

## 2018-05-11 MED ORDER — ONETOUCH VERIO W/DEVICE KIT: PACK | 1 refills | Status: DC

## 2018-05-11 MED ORDER — GLUCOSE BLOOD VI STRP: ORAL_STRIP | 11 refills | Status: DC

## 2018-05-11 MED ORDER — ALENDRONATE SODIUM 70 MG PO TABS: 70.0000 mg | ORAL_TABLET | ORAL | 2 refills | Status: DC

## 2018-05-11 MED ORDER — ONETOUCH DELICA LANCETS 33G MISC: 11 refills | Status: DC

## 2018-05-11 MED ORDER — MICARDIS 40 MG PO TABS: 40.0000 mg | ORAL_TABLET | Freq: Every day | ORAL | 2 refills | Status: DC

## 2018-05-11 NOTE — Patient Instructions (Signed)
Hearing Loss  Hearing loss is a partial or total loss of the ability to hear. This can be temporary or permanent, and it can happen in one or both ears. Hearing loss may be referred to as deafness. Medical care is necessary to treat hearing loss properly and to prevent the condition from getting worse. Your hearing may partially or completely come back, depending on what caused your hearing loss and how severe it is. In some cases, hearing loss is permanent. What are the causes? Common causes of hearing loss include:  Too much wax in the ear canal.  Infection of the ear canal or middle ear.  Fluid in the middle ear.  Injury to the ear or surrounding area.  An object stuck in the ear.  Prolonged exposure to loud sounds, such as music. Less common causes of hearing loss include:  Tumors in the ear.  Viral or bacterial infections, such as meningitis.  A hole in the eardrum (perforated eardrum).  Problems with the hearing nerve that sends signals between the brain and the ear.  Certain medicines. What are the signs or symptoms? Symptoms of this condition may include:  Difficulty telling the difference between sounds.  Difficulty following a conversation when there is background noise.  Lack of response to sounds in your environment. This may be most noticeable when you do not respond to startling sounds.  Needing to turn up the volume on the television, radio, etc.  Ringing in the ears.  Dizziness.  Pain in the ears. How is this diagnosed? This condition is diagnosed based on a physical exam and a hearing test (audiometry). The audiometry test will be performed by a hearing specialist (audiologist). You may also be referred to an ear, nose, and throat (ENT) specialist (otolaryngologist). How is this treated? Treatment for recent onset of hearing loss may include:  Ear wax removal.  Being prescribed medicines to prevent infection (antibiotics).  Being prescribed  medicines to reduce inflammation (corticosteroids). Follow these instructions at home:  If you were prescribed an antibiotic medicine, take it as told by your health care provider. Do not stop taking the antibiotic even if you start to feel better.  Take over-the-counter and prescription medicines only as told by your health care provider.  Avoid loud noises.  Return to your normal activities as told by your health care provider. Ask your health care provider what activities are safe for you.  Keep all follow-up visits as told by your health care provider. This is important. Contact a health care provider if:  You feel dizzy.  You develop new symptoms.  You vomit or feel nauseous.  You have a fever. Get help right away if:  You develop sudden changes in your vision.  You have severe ear pain.  You have new or increased weakness.  You have a severe headache. This information is not intended to replace advice given to you by your health care provider. Make sure you discuss any questions you have with your health care provider. Document Released: 02/21/2005 Document Revised: 07/30/2015 Document Reviewed: 07/09/2014 Elsevier Interactive Patient Education  2019 [employer].

## 2018-05-14 LAB — RPR, QUANT+TP ABS (REFLEX)
Rapid Plasma Reagin, Quant: 1:1 {titer} — ABNORMAL HIGH
T Pallidum Abs: UNDETERMINED

## 2018-05-14 LAB — TSH: TSH: 1.58 u[IU]/mL (ref 0.450–4.500)

## 2018-05-14 LAB — VITAMIN B12: Vitamin B-12: 1026 pg/mL (ref 232–1245)

## 2018-05-22 DIAGNOSIS — M25511 Pain in right shoulder: Secondary | ICD-10-CM | POA: Diagnosis not present

## 2018-06-03 NOTE — Progress Notes (Signed)
Subjective:     Patient ID: Leah Wiggins , female    DOB: [birth year] , 85 y.o.   MRN: [id number]   Chief Complaint  Patient presents with  . Follow-up    Emergency room    HPI  She is here today for f/u ER evaluation. She presented on 2/29 s/p fall. She reports she tripped over a curb when walking into the grocery store. She fell onto her right arm and developed right arm pain. 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 ER evaluation revealed tenderness R proximal humerus w/ no elbow or wrist tenderness, edema of entire arm and forearm, 2+ radial pulse and normal sensation. Xray revealed proximal radius fx. She was placed in a sling and advised to f/u with Ortho. She is still wearing the sling. She does have some pain with movement.     Past Medical History:  Diagnosis Date  . Arthritis    "shoulders" (05/04/2017)  . Breast cancer, right breast ([place]) 1991  . GERD (gastroesophageal reflux disease)   . Hyperlipidemia   . Hypertension   . Non-stress test nonreactive, 04/24/12, normal 12/10/2012  . OSA on CPAP    "don't wear it all the time" (05/04/2017)  . PVD (peripheral vascular disease) ([place])   . Tobacco use 12/10/2012  . Type II diabetes mellitus ([facility])      Family History  Problem Relation Age of Onset  . Heart disease Mother   . Stroke Mother   . Stroke Father   . Cancer Sister        GIST  . Cancer Brother        thyroid   . Diabetes Son   . Stroke Son      Current Outpatient Medications:  .  alendronate (FOSAMAX) 70 MG tablet, Take 1 tablet (70 mg total) by mouth every Wednesday. Take with a full glass of water on an empty stomach, Disp: 12 tablet, Rfl: 2 .  aspirin EC 81 MG tablet, Take 81 mg by mouth daily., Disp: , Rfl:  .  benzonatate (TESSALON PERLES) 100 MG capsule, Take 1 capsule (100 mg total) by mouth every 6 (six) hours as needed for cough., Disp: 30 capsule, Rfl: 1 .  clotrimazole-betamethasone (LOTRISONE) cream, Apply to  affected area 2 times daily prn, Disp: 30 g, Rfl: 0 .  EPINEPHrine 0.3 mg/0.3 mL IJ SOAJ injection, Inject 0.3 mLs (0.3 mg total) into the muscle once as needed (prn worsening angioedema)., Disp: 1 Device, Rfl: 0 .  ferrous sulfate 325 (65 FE) MG EC tablet, Take 1 tablet (325 mg total) by mouth 3 (three) times daily with meals., Disp: 90 tablet, Rfl: 2 .  HYDROcodone-acetaminophen (NORCO/VICODIN) 5-325 MG tablet, Take 1 tablet by mouth every 4 (four) hours as needed., Disp: 10 tablet, Rfl: 0 .  metFORMIN (GLUCOPHAGE-XR) 500 MG 24 hr tablet, Take 500 mg by mouth 2 (two) times daily., Disp: , Rfl:  .  MICARDIS 40 MG tablet, Take 1 tablet (40 mg total) by mouth daily., Disp: 90 tablet, Rfl: 2 .  mometasone (ELOCON) 0.1 % lotion, Apply 1 application topically daily as needed (IRRITATION). , Disp: , Rfl: 0 .  mupirocin ointment (BACTROBAN) 2 %, 1 application as needed. , Disp: , Rfl: 0 .  pantoprazole (PROTONIX) 40 MG tablet, Take 1 tablet (40 mg total) by mouth daily at 12 noon., Disp: 90 tablet, Rfl: 2 .  pravastatin (PRAVACHOL) 40 MG tablet, Take 40 mg by mouth  daily. , Disp: , Rfl:  .  tiotropium (SPIRIVA) 18 MCG inhalation capsule, Place 18 mcg into inhaler and inhale daily. , Disp: , Rfl:  .  Blood Glucose Monitoring Suppl (ONETOUCH VERIO) w/Device KIT, Use as directed to check blood sugars 1 time per day dx: e11.65, Disp: 1 kit, Rfl: 1 .  glucose blood (ONETOUCH VERIO) test strip, Use as instructed to check blood sugars 1 time per day dx: e11.65, Disp: 50 each, Rfl: 11 .  OneTouch Delica Lancets 01U MISC, Use as directed to check blood sugars 1 time per day dx: e11.65, Disp: 50 each, Rfl: 11   Allergies  Allergen Reactions  . Bactrim [Sulfamethoxazole-Trimethoprim] Swelling    Marked angioedema requiring hospitalization  . Penicillins Other (See Comments)    Has patient had a PCN reaction causing immediate rash, facial/tongue/throat swelling, SOB or lightheadedness with hypotension: Unk Has  patient had a PCN reaction causing severe rash involving mucus membranes or skin necrosis: Unk Has patient had a PCN reaction that required hospitalization: Unk Has patient had a PCN reaction occurring within the last 10 years: No If all of the above answers are "NO", then may proceed with Cephalosporin use.     Review of Systems  Constitutional: Negative.   HENT: Positive for hearing loss.   Respiratory: Negative.   Cardiovascular: Negative.   Gastrointestinal: Negative.   Musculoskeletal: Positive for arthralgias.  Neurological: Negative.        Daughter reports pt is having some issues with her memory.   Psychiatric/Behavioral: Negative.      Today's Vitals   05/11/18 1447  BP: 128/74  Pulse: 84  Temp: 98.3 F (36.8 C)  TempSrc: Oral  Weight: 163 lb (73.9 kg)  Height: 5' 3" (1.6 m)   Body mass index is 28.87 kg/m.   Objective:  Physical Exam Vitals signs and nursing note reviewed.  Constitutional:      Appearance: Normal appearance.  HENT:     Head: Normocephalic and atraumatic.  Cardiovascular:     Rate and Rhythm: Normal rate and regular rhythm.     Heart sounds: Normal heart sounds.  Pulmonary:     Effort: Pulmonary effort is normal.     Breath sounds: Normal breath sounds.  Musculoskeletal:     Comments: Right arm in sling  Skin:    General: Skin is warm.  Neurological:     General: No focal deficit present.     Mental Status: She is alert.  Psychiatric:        Mood and Affect: Mood normal.        Behavior: Behavior normal.         Assessment And Plan:     1. Fall involving sidewalk curb, subsequent encounter  Occurred on 2/29. ER records reviewed in full detail.   2. Closed fracture of right shoulder with routine healing, subsequent encounter  Please see above. Pt advised that she will need to have f/u dexa scan.   3. Bilateral hearing loss, unspecified hearing loss type  Chronic. I will refer her to ENT for further evaluation.   -  Ambulatory referral to ENT  4. Memory loss  I will check labs as listed below along with RPR.   - TSH - Vitamin B12  5. Estrogen deficiency  - DG Bone Density; Future  6. Body mass index (bmi) 28.0-28.9, adult  Her weight is stable.   7. Overweight   Maximino Greenland, MD

## 2018-06-18 ENCOUNTER — Ambulatory Visit (INDEPENDENT_AMBULATORY_CARE_PROVIDER_SITE_OTHER): Payer: Medicare Other | Admitting: Internal Medicine

## 2018-06-18 ENCOUNTER — Encounter: Payer: Self-pay | Admitting: Internal Medicine

## 2018-06-18 ENCOUNTER — Other Ambulatory Visit: Payer: Self-pay

## 2018-06-18 VITALS — BP 112/78 | HR 85 | Temp 98.7°F | Ht 63.0 in | Wt 163.0 lb

## 2018-06-18 DIAGNOSIS — Z7189 Other specified counseling: Secondary | ICD-10-CM

## 2018-06-18 DIAGNOSIS — E2839 Other primary ovarian failure: Secondary | ICD-10-CM

## 2018-06-18 DIAGNOSIS — I129 Hypertensive chronic kidney disease with stage 1 through stage 4 chronic kidney disease, or unspecified chronic kidney disease: Secondary | ICD-10-CM | POA: Diagnosis not present

## 2018-06-18 DIAGNOSIS — F17210 Nicotine dependence, cigarettes, uncomplicated: Secondary | ICD-10-CM

## 2018-06-18 DIAGNOSIS — N182 Chronic kidney disease, stage 2 (mild): Secondary | ICD-10-CM

## 2018-06-18 DIAGNOSIS — S4291XD Fracture of right shoulder girdle, part unspecified, subsequent encounter for fracture with routine healing: Secondary | ICD-10-CM

## 2018-06-18 DIAGNOSIS — J449 Chronic obstructive pulmonary disease, unspecified: Secondary | ICD-10-CM

## 2018-06-18 DIAGNOSIS — E1122 Type 2 diabetes mellitus with diabetic chronic kidney disease: Secondary | ICD-10-CM

## 2018-06-18 NOTE — Patient Instructions (Signed)

## 2018-06-18 NOTE — Progress Notes (Addendum)
Virtual Visit via Video Note   This visit type was conducted due to national recommendations for restrictions regarding the COVID-19 Pandemic (e.g. social distancing).  This format is felt to be most appropriate for this patient at this time.  All issues noted in this document were discussed and addressed.  No physical exam was performed (except for noted visual exam findings with Video Visits).  Please refer to the patient's chart (MyChart message for video visits and phone note for telephone visits) for the patient's consent to telehealth for [facility].  Date:  06/18/2018   ID:  Leah Wiggins, DOB [birth year], MRN [id number]  Patient Location:  Home  Provider location:   Office    Chief Complaint:  Blood pressure check, dm check  History of Present Illness:    Leah Wiggins is a 85 y.o. female who presents via video conferencing for a telehealth visit today.   The patient does not have symptoms concerning for COVID-19 infection (fever, chills, cough, or new shortness of breath).   Hypertension  This is a chronic problem. The current episode started more than 1 year ago. The problem has been gradually improving since onset. The problem is controlled. Pertinent negatives include no blurred vision, chest pain, palpitations or shortness of breath. Risk factors for coronary artery disease include diabetes mellitus, dyslipidemia, post-menopausal state and sedentary lifestyle.  Diabetes  She presents for her follow-up diabetic visit. She has type 2 diabetes mellitus. Her disease course has been stable. There are no hypoglycemic associated symptoms. Pertinent negatives for diabetes include no blurred vision and no chest pain. There are no hypoglycemic complications.   She reports compliance with meds.   Past Medical History:  Diagnosis Date  . Arthritis    "shoulders" (05/04/2017)  . Breast cancer, right breast ([place]) 1991  . GERD (gastroesophageal reflux disease)   .  Hyperlipidemia   . Hypertension   . Non-stress test nonreactive, 04/24/12, normal 12/10/2012  . OSA on CPAP    "don't wear it all the time" (05/04/2017)  . PVD (peripheral vascular disease) ([place])   . Tobacco use 12/10/2012  . Type II diabetes mellitus ([place])    Past Surgical History:  Procedure Laterality Date  . APPENDECTOMY    . BIOPSY  01/19/2018   Procedure: BIOPSY;  Surgeon: Carol Ada, MD;  Location: [facility];  Service: Endoscopy;;  . BREAST BIOPSY Right 1991  . CARDIOVASCULAR STRESS TEST  04/24/2012   No significant ST segment change suggestive of ischemia.  Marland Kitchen CATARACT EXTRACTION, BILATERAL Bilateral   . COLONOSCOPY WITH PROPOFOL N/A 01/19/2018   Procedure: COLONOSCOPY WITH PROPOFOL;  Surgeon: Carol Ada, MD;  Location: [facility];  Service: Endoscopy;  Laterality: N/A;  . ESOPHAGOGASTRODUODENOSCOPY (EGD) WITH PROPOFOL N/A 01/19/2018   Procedure: ESOPHAGOGASTRODUODENOSCOPY (EGD) WITH PROPOFOL;  Surgeon: Carol Ada, MD;  Location: [facility];  Service: Endoscopy;  Laterality: N/A;  . JOINT REPLACEMENT    . LOWER EXTREMITY ARTERIAL DOPPLER Bilateral 05/07/2012   Left ABI-demonstrated mild arterial insufficiency. Right CIA 50-69% diameter reduction. Right SFA less than 50% diameter reduction. Left SFA 70-99% diameter reduction. Bilateral Runoff-Posterior tibial arteries appeared occluded.  Marland Kitchen MASTECTOMY PARTIAL / LUMPECTOMY W/ AXILLARY LYMPHADENECTOMY Right 1991   . TOTAL HIP ARTHROPLASTY Right 2011     Current Meds  Medication Sig  . alendronate (FOSAMAX) 70 MG tablet Take 1 tablet (70 mg total) by mouth every Wednesday. Take with a full glass of water on an empty stomach  . aspirin EC 81 MG tablet  Take 81 mg by mouth daily.  . benzonatate (TESSALON PERLES) 100 MG capsule Take 1 capsule (100 mg total) by mouth every 6 (six) hours as needed for cough.  . Blood Glucose Monitoring Suppl (ONETOUCH VERIO) w/Device KIT Use as directed to check blood sugars 1 time per day dx:  e11.65  . clotrimazole-betamethasone (LOTRISONE) cream Apply to affected area 2 times daily prn  . EPINEPHrine 0.3 mg/0.3 mL IJ SOAJ injection Inject 0.3 mLs (0.3 mg total) into the muscle once as needed (prn worsening angioedema).  . ferrous sulfate 325 (65 FE) MG EC tablet Take 1 tablet (325 mg total) by mouth 3 (three) times daily with meals.  Marland Kitchen glucose blood (ONETOUCH VERIO) test strip Use as instructed to check blood sugars 1 time per day dx: e11.65  . metFORMIN (GLUCOPHAGE-XR) 500 MG 24 hr tablet Take 500 mg by mouth 2 (two) times daily.  Marland Kitchen MICARDIS 40 MG tablet Take 1 tablet (40 mg total) by mouth daily.  . mometasone (ELOCON) 0.1 % lotion Apply 1 application topically daily as needed (IRRITATION).   Marland Kitchen mupirocin ointment (BACTROBAN) 2 % 1 application as needed.   Glory Rosebush Delica Lancets 53Z MISC Use as directed to check blood sugars 1 time per day dx: e11.65  . pantoprazole (PROTONIX) 40 MG tablet Take 1 tablet (40 mg total) by mouth daily at 12 noon.  . pravastatin (PRAVACHOL) 40 MG tablet Take 40 mg by mouth daily.   Marland Kitchen tiotropium (SPIRIVA) 18 MCG inhalation capsule Place 18 mcg into inhaler and inhale daily.      Allergies:   Bactrim [sulfamethoxazole-trimethoprim] and Penicillins   Social History   Tobacco Use  . Smoking status: Current Some Day Smoker    Packs/day: 0.25    Years: 70.00    Pack years: 17.50    Types: Cigarettes    Start date: 03/08/1947  . Smokeless tobacco: Never Used  . Tobacco comment: has tried cold [place]  Substance Use Topics  . Alcohol use: No    Alcohol/week: 0.0 standard drinks  . Drug use: No     Family Hx: The patient's family history includes Cancer in her brother and sister; Diabetes in her son; Heart disease in her mother; Stroke in her father, mother, and son.  ROS:   Please see the history of present illness.    Review of Systems  Constitutional: Negative.   Eyes: Negative for blurred vision.  Respiratory: Negative.  Negative for  shortness of breath.   Cardiovascular: Negative.  Negative for chest pain and palpitations.  Gastrointestinal: Negative.   Neurological: Negative.   Psychiatric/Behavioral: Negative.     All other systems reviewed and are negative.   Labs/Other Tests and Data Reviewed:    Recent Labs: 01/22/2018: ALT 10; BUN 15; Creatinine, Ser 0.98; Hemoglobin 10.3; Platelet Count 133; Potassium 4.2; Sodium 145 05/11/2018: TSH 1.580   Recent Lipid Panel Lab Results  Component Value Date/Time   CHOL 125 09/08/2016 02:22 AM   TRIG 46 09/08/2016 02:22 AM   HDL 51 09/08/2016 02:22 AM   CHOLHDL 2.5 09/08/2016 02:22 AM   LDLCALC 65 09/08/2016 02:22 AM    Wt Readings from Last 3 Encounters:  06/18/18 163 lb (73.9 kg)  05/11/18 163 lb (73.9 kg)  05/05/18 164 lb (74.4 kg)     Exam:    Vital Signs:  BP 112/78 (BP Location: Left Arm, Patient Position: Sitting, Cuff Size: Normal) Comment: pt provided  Pulse 85 Comment: pt provided  Temp 98.7 F (  37.1 C) (Oral) Comment: pt provided  Ht '5\' 3"'$  (1.6 m)   Wt 163 lb (73.9 kg) Comment: pt provided  BMI 28.87 kg/m     Physical Exam  Constitutional: She is oriented to person, place, and time and well-developed, well-nourished, and in no distress.  HENT:  Head: Normocephalic and atraumatic.  Neck: Normal range of motion.  Pulmonary/Chest: Effort normal.  Neurological: She is alert and oriented to person, place, and time.  Psychiatric: Affect normal.  Nursing note and vitals reviewed.   ASSESSMENT & PLAN:     1. Hypertensive nephropathy  Well controlled. She will continue with current meds. She is encouraged to avoid adding salt to her foods.   2. Type 2 diabetes mellitus with stage 2 chronic kidney disease, without long-term current use of insulin ([place])  I will check labs as listed below. She agrees to come in within 2-3 weeks for bloodwork.  She is encouraged to avoid sugary beverages and sweetened foods.   - Lipid panel; Future -  CMP14+EGFR; Future - Hemoglobin A1c; Future  3. Chronic obstructive pulmonary disease, unspecified COPD type (HCC)  Chronic, yet stable. Importance of smoking cessation was discussed with the patient.   4. Cigarette nicotine dependence without complication  Again, discussion regarding tobacco cessation was discussed with the patient for greater than 3 minutes.   5. Closed fracture of right shoulder with routine healing, subsequent encounter  She is having minimal pain at this time. Pt advised she needs to have f/u bone density.   6. Estrogen deficiency  - DG Bone Density; Future    COVID-19 Education: The signs and symptoms of COVID-19 were discussed with the patient and how to seek care for testing (follow up with PCP or arrange E-visit).  The importance of social distancing was discussed today.  Patient Risk:   After full review of this patients clinical status, I feel that they are at least moderate risk at this time.  Time:   Today, I have spent 16 minutes with the patient with telehealth technology discussing above diagnoses.    Medication Adjustments/Labs and Tests Ordered: Current medicines are reviewed at length with the patient today.  Concerns regarding medicines are outlined above.   Tests Ordered: Orders Placed This Encounter  Procedures  . DG Bone Density  . Lipid panel  . CMP14+EGFR  . Hemoglobin A1c   Medication Changes: No orders of the defined types were placed in this encounter.   Disposition:  Follow up in 3 month(s)  Signed, Maximino Greenland, MD

## 2018-06-19 DIAGNOSIS — M25511 Pain in right shoulder: Secondary | ICD-10-CM | POA: Diagnosis not present

## 2018-06-25 DIAGNOSIS — M6281 Muscle weakness (generalized): Secondary | ICD-10-CM | POA: Diagnosis not present

## 2018-06-25 DIAGNOSIS — M25511 Pain in right shoulder: Secondary | ICD-10-CM | POA: Diagnosis not present

## 2018-06-25 DIAGNOSIS — M25611 Stiffness of right shoulder, not elsewhere classified: Secondary | ICD-10-CM | POA: Diagnosis not present

## 2018-06-27 DIAGNOSIS — M6281 Muscle weakness (generalized): Secondary | ICD-10-CM | POA: Diagnosis not present

## 2018-06-27 DIAGNOSIS — M25511 Pain in right shoulder: Secondary | ICD-10-CM | POA: Diagnosis not present

## 2018-06-27 DIAGNOSIS — M25611 Stiffness of right shoulder, not elsewhere classified: Secondary | ICD-10-CM | POA: Diagnosis not present

## 2018-07-02 DIAGNOSIS — M6281 Muscle weakness (generalized): Secondary | ICD-10-CM | POA: Diagnosis not present

## 2018-07-02 DIAGNOSIS — M25611 Stiffness of right shoulder, not elsewhere classified: Secondary | ICD-10-CM | POA: Diagnosis not present

## 2018-07-02 DIAGNOSIS — M25511 Pain in right shoulder: Secondary | ICD-10-CM | POA: Diagnosis not present

## 2018-07-04 DIAGNOSIS — M25611 Stiffness of right shoulder, not elsewhere classified: Secondary | ICD-10-CM | POA: Diagnosis not present

## 2018-07-04 DIAGNOSIS — M6281 Muscle weakness (generalized): Secondary | ICD-10-CM | POA: Diagnosis not present

## 2018-07-04 DIAGNOSIS — M25511 Pain in right shoulder: Secondary | ICD-10-CM | POA: Diagnosis not present

## 2018-07-05 ENCOUNTER — Other Ambulatory Visit: Payer: Self-pay

## 2018-07-05 ENCOUNTER — Other Ambulatory Visit: Payer: Medicare Other

## 2018-07-05 DIAGNOSIS — E1122 Type 2 diabetes mellitus with diabetic chronic kidney disease: Secondary | ICD-10-CM

## 2018-07-05 DIAGNOSIS — N182 Chronic kidney disease, stage 2 (mild): Principal | ICD-10-CM

## 2018-07-06 ENCOUNTER — Telehealth: Payer: Self-pay

## 2018-07-06 LAB — CMP14+EGFR
ALT: 9 IU/L (ref 0–32)
AST: 19 IU/L (ref 0–40)
Albumin/Globulin Ratio: 1.6 (ref 1.2–2.2)
Albumin: 4 g/dL (ref 3.6–4.6)
Alkaline Phosphatase: 138 IU/L — ABNORMAL HIGH (ref 39–117)
BUN/Creatinine Ratio: 13 (ref 12–28)
BUN: 11 mg/dL (ref 8–27)
Bilirubin Total: 0.2 mg/dL (ref 0.0–1.2)
CO2: 23 mmol/L (ref 20–29)
Calcium: 8.9 mg/dL (ref 8.7–10.3)
Chloride: 103 mmol/L (ref 96–106)
Creatinine, Ser: 0.85 mg/dL (ref 0.57–1.00)
GFR calc Af Amer: 70 mL/min/{1.73_m2} (ref >59)
GFR calc non Af Amer: 61 mL/min/{1.73_m2} (ref >59)
Globulin, Total: 2.5 g/dL (ref 1.5–4.5)
Glucose: 109 mg/dL — ABNORMAL HIGH (ref 65–99)
Potassium: 4.2 mmol/L (ref 3.5–5.2)
Sodium: 145 mmol/L — ABNORMAL HIGH (ref 134–144)
Total Protein: 6.5 g/dL (ref 6.0–8.5)

## 2018-07-06 LAB — HEMOGLOBIN A1C
Est. average glucose Bld gHb Est-mCnc: 108 mg/dL
Hgb A1c MFr Bld: 5.4 % (ref 4.8–5.6)

## 2018-07-06 LAB — LIPID PANEL
Chol/HDL Ratio: 2 ratio (ref 0.0–4.4)
Cholesterol, Total: 145 mg/dL (ref 100–199)
HDL: 74 mg/dL (ref >39)
LDL Calculated: 63 mg/dL (ref 0–99)
Triglycerides: 40 mg/dL (ref 0–149)
VLDL Cholesterol Cal: 8 mg/dL (ref 5–40)

## 2018-07-06 NOTE — Telephone Encounter (Signed)
-----   Message from Glendale Chard, MD sent at 07/06/2018  9:13 AM EDT ----- Your hba1c is 5.4, this is great! Your sodium level is slightly elevated, please stay well hydrated.  Your cholesterol is great.

## 2018-07-06 NOTE — Telephone Encounter (Signed)
Left the patient a message to call back for lab results. 

## 2018-07-09 ENCOUNTER — Ambulatory Visit: Payer: Self-pay

## 2018-07-09 DIAGNOSIS — M25661 Stiffness of right knee, not elsewhere classified: Secondary | ICD-10-CM | POA: Diagnosis not present

## 2018-07-09 DIAGNOSIS — N182 Chronic kidney disease, stage 2 (mild): Secondary | ICD-10-CM

## 2018-07-09 DIAGNOSIS — M6281 Muscle weakness (generalized): Secondary | ICD-10-CM | POA: Diagnosis not present

## 2018-07-09 DIAGNOSIS — E1122 Type 2 diabetes mellitus with diabetic chronic kidney disease: Secondary | ICD-10-CM

## 2018-07-09 DIAGNOSIS — J449 Chronic obstructive pulmonary disease, unspecified: Secondary | ICD-10-CM

## 2018-07-09 DIAGNOSIS — I129 Hypertensive chronic kidney disease with stage 1 through stage 4 chronic kidney disease, or unspecified chronic kidney disease: Secondary | ICD-10-CM

## 2018-07-09 DIAGNOSIS — M25511 Pain in right shoulder: Secondary | ICD-10-CM | POA: Diagnosis not present

## 2018-07-09 NOTE — Chronic Care Management (AMB) (Signed)
  Chronic Care Management   Outreach Note  07/09/2018 Name: Leah Wiggins MRN: [id number] DOB: 1926/07/27  Referred FU:[id number] health plan Reason for referral : Care Coordination   An unsuccessful telephone outreach was attempted today. The patient was referred to the case management team by for assistance with chronic care management and care coordination. Unfortunately, today's call was unsuccessful with no option to leave a voice message.  Follow Up Plan: The CM team will reach out to the patient again over the next 7-10 days.   Daneen Schick, BSW, CDP [facility] / [facility] Care Management Social Worker [phone]  Total time spent performing care coordination and/or care management activities with the patient by phone or face to face = 5 minutes.

## 2018-07-10 ENCOUNTER — Telehealth: Payer: Self-pay

## 2018-07-10 NOTE — Telephone Encounter (Signed)
The pt was notified that her daughter Leah Wiggins's flma forms are ready for pickup and has been faxed.

## 2018-07-11 DIAGNOSIS — M25511 Pain in right shoulder: Secondary | ICD-10-CM | POA: Diagnosis not present

## 2018-07-11 DIAGNOSIS — M25611 Stiffness of right shoulder, not elsewhere classified: Secondary | ICD-10-CM | POA: Diagnosis not present

## 2018-07-11 DIAGNOSIS — M6281 Muscle weakness (generalized): Secondary | ICD-10-CM | POA: Diagnosis not present

## 2018-07-13 ENCOUNTER — Ambulatory Visit: Payer: Self-pay

## 2018-07-13 DIAGNOSIS — E1122 Type 2 diabetes mellitus with diabetic chronic kidney disease: Secondary | ICD-10-CM

## 2018-07-13 DIAGNOSIS — J449 Chronic obstructive pulmonary disease, unspecified: Secondary | ICD-10-CM

## 2018-07-13 DIAGNOSIS — N182 Chronic kidney disease, stage 2 (mild): Secondary | ICD-10-CM

## 2018-07-13 DIAGNOSIS — I129 Hypertensive chronic kidney disease with stage 1 through stage 4 chronic kidney disease, or unspecified chronic kidney disease: Secondary | ICD-10-CM

## 2018-07-13 NOTE — Chronic Care Management (AMB) (Deleted)
  Chronic Care Management   Outreach Note  07/13/2018 Name: CALEEN TAAFFE MRN: [id number] DOB: 06-Aug-1926  Referred by: Glendale Chard, MD Reason for referral : Care Coordination   {CCM OUTREACH ATTEMPTS:22247}  Follow Up Plan: {CCM FOLLOW UP PLAN:22241}  SIGNATURE

## 2018-07-13 NOTE — Chronic Care Management (AMB) (Signed)
  Chronic Care Management   Telephone Outreach Note  07/13/2018 Name: Leah Wiggins MRN: [id number] DOB: April 13, 1926  Referred by: patient's health plan.   I reached out to Ms. Leah Wiggins today by phone in response to a referral sent by Leah Wiggins health plan. Unfortunately, the patient had difficulty understanding CCM SW. Patient gave verbal permission to outreach her daughter to complete call. CCM SW placed an unsuccessful call to the patients daughter, Leah Wiggins. HIPAA compliant voice message was left requesting a return call.  The CM team will reach out to the patient again over the next 7-10 days.    Glendale Chard, MD has been notified of this outreach and Leah Wiggins's decision and plan.   Daneen Schick, BSW, CDP [facility] / [facility] Care Management Social Worker [phone]  Total time spent performing care coordination and/or care management activities with the patient by phone or face to face =  8 minutes.

## 2018-07-16 DIAGNOSIS — M25611 Stiffness of right shoulder, not elsewhere classified: Secondary | ICD-10-CM | POA: Diagnosis not present

## 2018-07-16 DIAGNOSIS — M6281 Muscle weakness (generalized): Secondary | ICD-10-CM | POA: Diagnosis not present

## 2018-07-16 DIAGNOSIS — M25511 Pain in right shoulder: Secondary | ICD-10-CM | POA: Diagnosis not present

## 2018-07-18 DIAGNOSIS — M25511 Pain in right shoulder: Secondary | ICD-10-CM | POA: Diagnosis not present

## 2018-07-18 DIAGNOSIS — M25611 Stiffness of right shoulder, not elsewhere classified: Secondary | ICD-10-CM | POA: Diagnosis not present

## 2018-07-18 DIAGNOSIS — M6281 Muscle weakness (generalized): Secondary | ICD-10-CM | POA: Diagnosis not present

## 2018-07-19 ENCOUNTER — Ambulatory Visit: Payer: Self-pay

## 2018-07-19 DIAGNOSIS — E1122 Type 2 diabetes mellitus with diabetic chronic kidney disease: Secondary | ICD-10-CM

## 2018-07-19 DIAGNOSIS — I129 Hypertensive chronic kidney disease with stage 1 through stage 4 chronic kidney disease, or unspecified chronic kidney disease: Secondary | ICD-10-CM

## 2018-07-19 DIAGNOSIS — N182 Chronic kidney disease, stage 2 (mild): Secondary | ICD-10-CM

## 2018-07-19 NOTE — Chronic Care Management (AMB) (Signed)
  Chronic Care Management   Outreach Note  07/19/2018 Name: Leah Wiggins MRN: [id number] DOB: 03-10-1926  Referred by: patients health plan Reason for referral : Care Coordination   Third unsuccessful telephone outreach was attempted today. The patient was referred to the case management team for assistance with chronic care management and care coordination. The patient's primary care provider has been notified of our unsuccessful attempts to make or maintain contact with the patient. The care management team is pleased to engage with this patient at any time in the future should he/she be interested in assistance from the care management team.   Follow Up Plan: No further follow up required: Unable to establish patient contact after multpile attempts to the patient as well as her daughter, Wonda Cerise, CDP [facility] / [place] Worker [phone]  Total time spent performing care coordination and/or care management activities with the patient by phone or face to face = 3 minutes.

## 2018-07-20 ENCOUNTER — Other Ambulatory Visit: Payer: Self-pay | Admitting: Internal Medicine

## 2018-07-23 DIAGNOSIS — M25611 Stiffness of right shoulder, not elsewhere classified: Secondary | ICD-10-CM | POA: Diagnosis not present

## 2018-07-23 DIAGNOSIS — M79672 Pain in left foot: Secondary | ICD-10-CM | POA: Diagnosis not present

## 2018-07-23 DIAGNOSIS — E1151 Type 2 diabetes mellitus with diabetic peripheral angiopathy without gangrene: Secondary | ICD-10-CM | POA: Diagnosis not present

## 2018-07-23 DIAGNOSIS — I739 Peripheral vascular disease, unspecified: Secondary | ICD-10-CM | POA: Diagnosis not present

## 2018-07-23 DIAGNOSIS — R234 Changes in skin texture: Secondary | ICD-10-CM | POA: Diagnosis not present

## 2018-07-23 DIAGNOSIS — M79671 Pain in right foot: Secondary | ICD-10-CM | POA: Diagnosis not present

## 2018-07-23 DIAGNOSIS — B351 Tinea unguium: Secondary | ICD-10-CM | POA: Diagnosis not present

## 2018-07-23 DIAGNOSIS — M6281 Muscle weakness (generalized): Secondary | ICD-10-CM | POA: Diagnosis not present

## 2018-07-23 DIAGNOSIS — M25511 Pain in right shoulder: Secondary | ICD-10-CM | POA: Diagnosis not present

## 2018-07-25 DIAGNOSIS — M25611 Stiffness of right shoulder, not elsewhere classified: Secondary | ICD-10-CM | POA: Diagnosis not present

## 2018-07-25 DIAGNOSIS — M6281 Muscle weakness (generalized): Secondary | ICD-10-CM | POA: Diagnosis not present

## 2018-07-25 DIAGNOSIS — M25511 Pain in right shoulder: Secondary | ICD-10-CM | POA: Diagnosis not present

## 2018-07-31 DIAGNOSIS — M25511 Pain in right shoulder: Secondary | ICD-10-CM | POA: Diagnosis not present

## 2018-08-24 ENCOUNTER — Other Ambulatory Visit (HOSPITAL_COMMUNITY): Payer: Self-pay | Admitting: Podiatry

## 2018-08-24 DIAGNOSIS — I739 Peripheral vascular disease, unspecified: Secondary | ICD-10-CM

## 2018-08-27 ENCOUNTER — Ambulatory Visit (HOSPITAL_COMMUNITY)
Admission: RE | Admit: 2018-08-27 | Discharge: 2018-08-27 | Disposition: A | Discharge status: Home / Self Care | Payer: Medicare Other | Source: Ambulatory Visit | Attending: Cardiovascular Disease | Admitting: Cardiovascular Disease

## 2018-08-27 ENCOUNTER — Other Ambulatory Visit: Payer: Self-pay

## 2018-08-27 DIAGNOSIS — E119 Type 2 diabetes mellitus without complications: Secondary | ICD-10-CM | POA: Diagnosis not present

## 2018-08-27 DIAGNOSIS — I739 Peripheral vascular disease, unspecified: Secondary | ICD-10-CM | POA: Diagnosis not present

## 2018-08-27 DIAGNOSIS — Z961 Presence of intraocular lens: Secondary | ICD-10-CM | POA: Diagnosis not present

## 2018-08-27 LAB — HM DIABETES EYE EXAM

## 2018-09-03 ENCOUNTER — Encounter: Payer: Self-pay | Admitting: Internal Medicine

## 2018-09-04 ENCOUNTER — Telehealth: Payer: Self-pay

## 2018-09-04 NOTE — Telephone Encounter (Signed)
The pt wanted the office to call [facility] to ask about her metformin because she said that the pharmacy called and said that they were sending a different metformin.  I called [facility] and was told that the pt's account was terminated. I called and notified the pt of this information.

## 2018-09-18 DIAGNOSIS — L853 Xerosis cutis: Secondary | ICD-10-CM | POA: Diagnosis not present

## 2018-09-18 DIAGNOSIS — L299 Pruritus, unspecified: Secondary | ICD-10-CM | POA: Diagnosis not present

## 2018-09-18 DIAGNOSIS — L2084 Intrinsic (allergic) eczema: Secondary | ICD-10-CM | POA: Insufficient documentation

## 2018-09-27 IMAGING — DX DG CHEST 2V
2 series · 2 of 2 positions shown · non-contrast
Comparison: 12/18/2015.

CLINICAL DATA: Cough.  Weight loss.

EXAM:
CHEST  2 VIEW

[dg chest 2 view (1 of 2)]
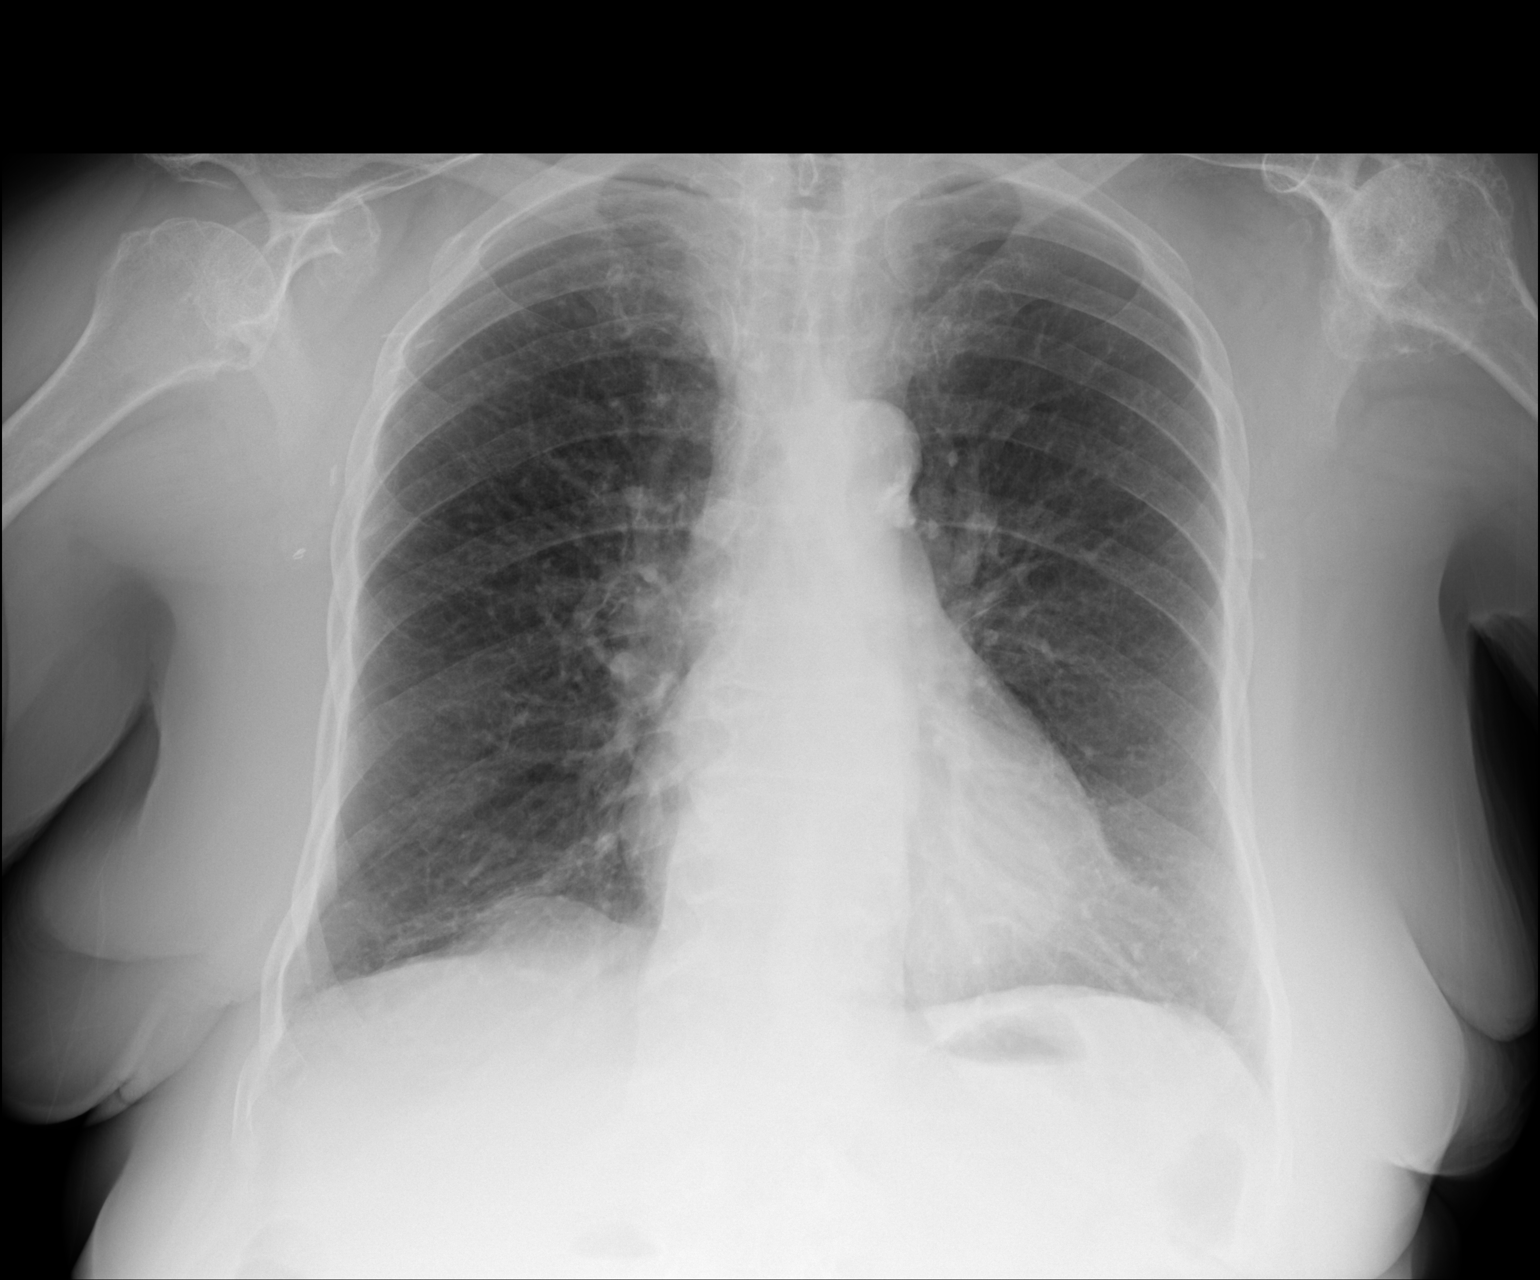

[dg chest 2 view (2 of 2)]
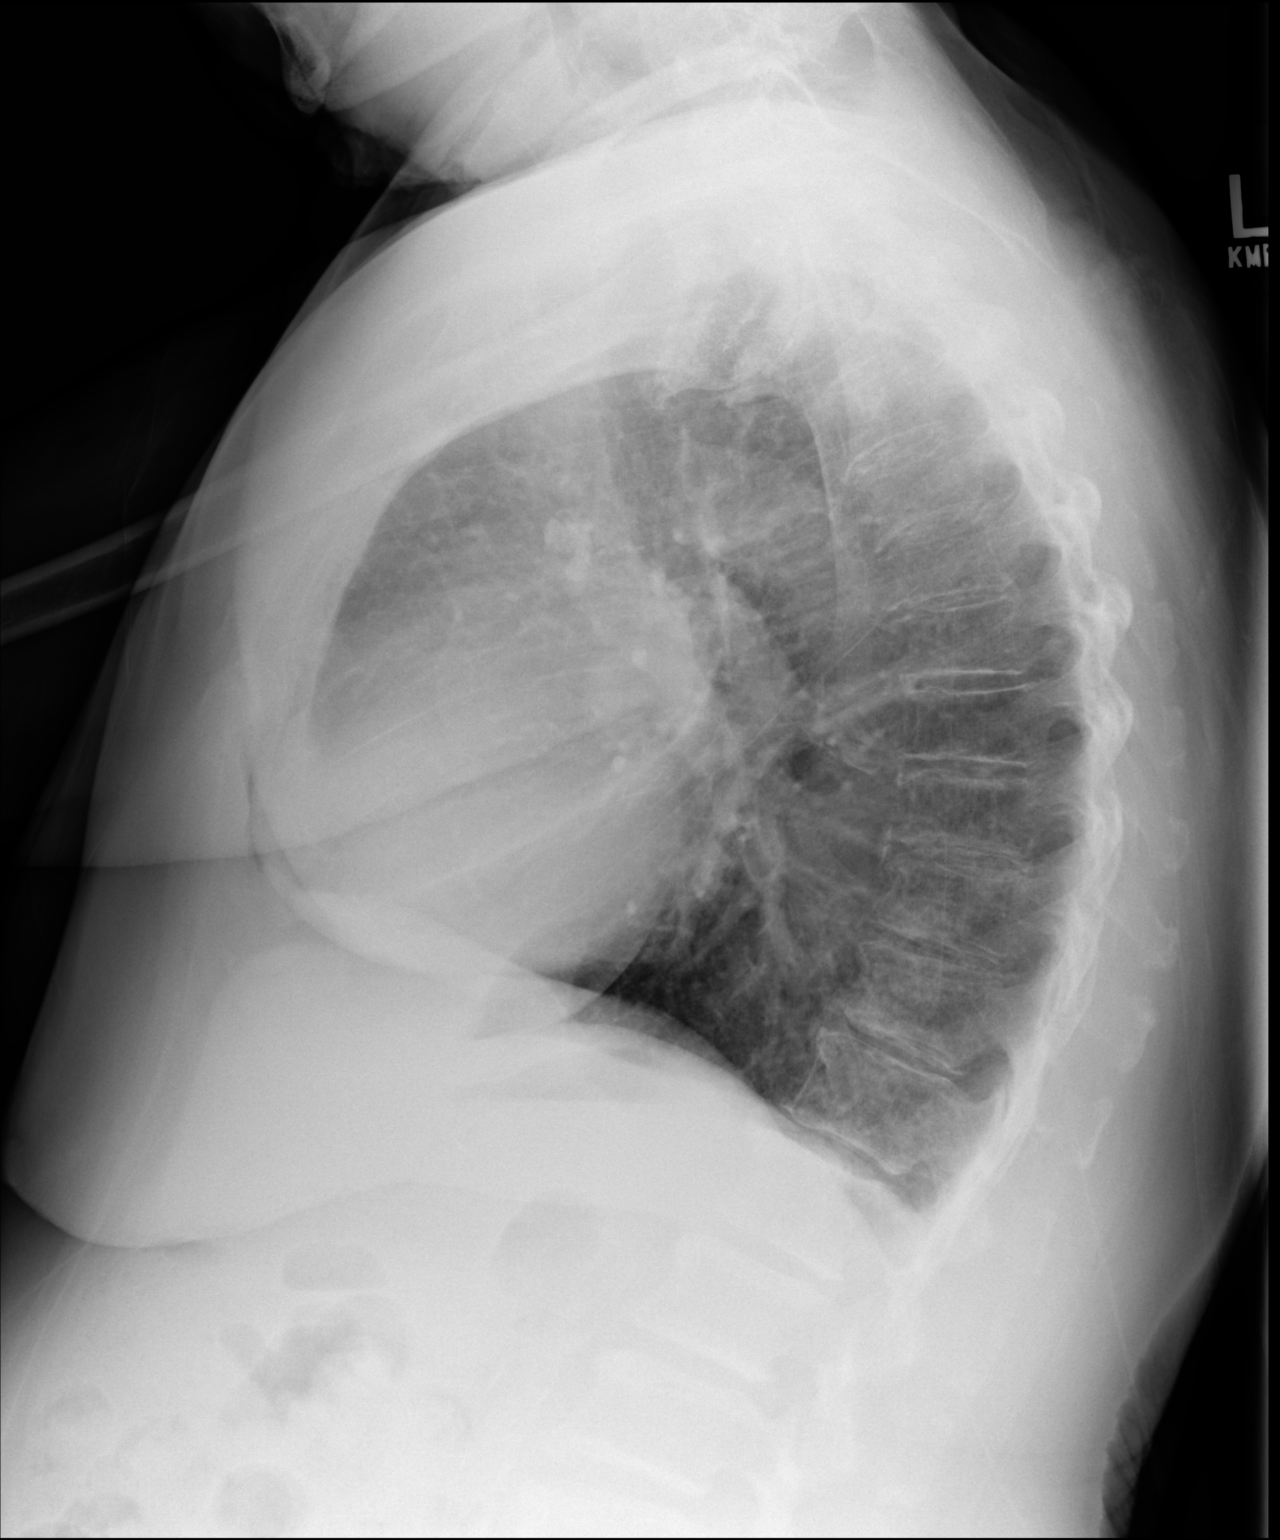

[2 of 2 positions shown; findings below may reference images not displayed]

FINDINGS: Mediastinum hilar structures are normal. Heart size normal. No focal
infiltrate. No pleural effusion or pneumothorax. No acute bony
abnormality. Degenerative changes both shoulders, no change from
prior exam .
IMPRESSION: No acute cardiopulmonary disease.

## 2018-10-08 DIAGNOSIS — I739 Peripheral vascular disease, unspecified: Secondary | ICD-10-CM | POA: Diagnosis not present

## 2018-10-08 DIAGNOSIS — E1151 Type 2 diabetes mellitus with diabetic peripheral angiopathy without gangrene: Secondary | ICD-10-CM | POA: Diagnosis not present

## 2018-10-08 DIAGNOSIS — B351 Tinea unguium: Secondary | ICD-10-CM | POA: Diagnosis not present

## 2018-10-18 ENCOUNTER — Ambulatory Visit: Payer: Medicare Other | Admitting: Internal Medicine

## 2018-10-30 ENCOUNTER — Encounter: Payer: Self-pay | Admitting: Cardiovascular Disease

## 2018-10-30 ENCOUNTER — Other Ambulatory Visit: Payer: Self-pay

## 2018-10-30 ENCOUNTER — Ambulatory Visit (INDEPENDENT_AMBULATORY_CARE_PROVIDER_SITE_OTHER): Payer: Medicare Other | Admitting: Cardiovascular Disease

## 2018-10-30 VITALS — BP 134/64 | HR 86 | Temp 96.8°F | Ht 64.0 in | Wt 160.0 lb

## 2018-10-30 DIAGNOSIS — I10 Essential (primary) hypertension: Secondary | ICD-10-CM | POA: Diagnosis not present

## 2018-10-30 DIAGNOSIS — I739 Peripheral vascular disease, unspecified: Secondary | ICD-10-CM

## 2018-10-30 DIAGNOSIS — E785 Hyperlipidemia, unspecified: Secondary | ICD-10-CM

## 2018-10-30 DIAGNOSIS — Z008 Encounter for other general examination: Secondary | ICD-10-CM

## 2018-10-30 NOTE — Assessment & Plan Note (Signed)
History of PAD with Dopplers performed 08/27/2018 revealing a right ABI 0.76 and a left of 0.88.  She does have a mildly elevated signal in her mid to distal right SFA with tibial vessel disease as well as tibial the vessel disease on the left.  She really denies claudication but does have symptoms of peripheral neuropathy and some pain in her feet.  I do not think she requires further work-up at this time.

## 2018-10-30 NOTE — Progress Notes (Signed)
10/30/2018 Leah Wiggins   01-25-1927  [id number]  Primary Physician Glendale Chard, MD Primary Cardiologist: Lorretta Harp MD FACP, [place], [place], [place]  HPI:  Leah Wiggins is a 85 y.o.  mildly overweight widowed [place] American female, mother of 60, grandmother of greater than 40 grandchildren. She is accompanied by her daughter Leah Wiggins today. I last saw her in the office  08/08/2017.Marland Kitchen Her risk factors include recently discontinued tobacco abuse having smoked for many years and currently wearing a nicotine patch., contrary, hypertension, mild hyperlipidemia, and non-insulin-requiring diabetes. She also has peripheral vascular occlusive disease with Dopplers that show a right ABI of .64 with a high-frequency signal in the origin of the right common iliac artery I saw this progression of disease in the distal right SFA., left ABI of 0.84 with a high-frequency signal in the origin of the left SFA, though she really denies claudication. She had a negative Myoview performed 04/24/12 and more recently 04/29/14 for atypical chest pain.  Since I saw her in the office a year ago she is remained asymptomatic denying chest pain, shortness of breath or claudication.  Current Meds  Medication Sig  . alendronate (FOSAMAX) 70 MG tablet Take 1 tablet (70 mg total) by mouth every Wednesday. Take with a full glass of water on an empty stomach  . aspirin EC 81 MG tablet Take 81 mg by mouth daily.  . benzonatate (TESSALON PERLES) 100 MG capsule Take 1 capsule (100 mg total) by mouth every 6 (six) hours as needed for cough.  . Blood Glucose Monitoring Suppl (ONETOUCH VERIO) w/Device KIT Use as directed to check blood sugars 1 time per day dx: e11.65  . clotrimazole-betamethasone (LOTRISONE) cream Apply to affected area 2 times daily prn  . EPINEPHrine 0.3 mg/0.3 mL IJ SOAJ injection Inject 0.3 mLs (0.3 mg total) into the muscle once as needed (prn worsening angioedema).  Marland Kitchen glucose blood (ONETOUCH  VERIO) test strip Use as instructed to check blood sugars 1 time per day dx: e11.65  . metFORMIN (GLUCOPHAGE-XR) 500 MG 24 hr tablet TAKE 1 TABLET TWICE A DAY  . MICARDIS 40 MG tablet Take 1 tablet (40 mg total) by mouth daily.  . mometasone (ELOCON) 0.1 % lotion Apply 1 application topically daily as needed (IRRITATION).   Marland Kitchen mupirocin ointment (BACTROBAN) 2 % 1 application as needed.   Glory Rosebush Delica Lancets 15A MISC Use as directed to check blood sugars 1 time per day dx: e11.65  . pantoprazole (PROTONIX) 40 MG tablet Take 1 tablet (40 mg total) by mouth daily at 12 noon.  . pravastatin (PRAVACHOL) 40 MG tablet TAKE 1 TABLET DAILY  . tiotropium (SPIRIVA) 18 MCG inhalation capsule Place 18 mcg into inhaler and inhale daily.      Allergies  Allergen Reactions  . Bactrim [Sulfamethoxazole-Trimethoprim] Swelling    Marked angioedema requiring hospitalization  . Penicillins Other (See Comments)    Has patient had a PCN reaction causing immediate rash, facial/tongue/throat swelling, SOB or lightheadedness with hypotension: Unk Has patient had a PCN reaction causing severe rash involving mucus membranes or skin necrosis: Unk Has patient had a PCN reaction that required hospitalization: Unk Has patient had a PCN reaction occurring within the last 10 years: No If all of the above answers are "NO", then may proceed with Cephalosporin use.    Social History   Socioeconomic History  . Marital status: Widowed    Spouse name: Not on file  . Number of children: Not  on file  . Years of education: Not on file  . Highest education level: Not on file  Occupational History  . Occupation: retired  Scientific laboratory technician  . Financial resource strain: Not hard at all  . Food insecurity    Worry: Never true    Inability: Never true  . Transportation needs    Medical: No    Non-medical: No  Tobacco Use  . Smoking status: Current Some Day Smoker    Packs/day: 0.25    Years: 70.00    Pack years: 17.50     Types: Cigarettes    Start date: 03/08/1947  . Smokeless tobacco: Never Used  . Tobacco comment: has tried cold [place]  Substance and Sexual Activity  . Alcohol use: No    Alcohol/week: 0.0 standard drinks  . Drug use: No  . Sexual activity: Not Currently  Lifestyle  . Physical activity    Days per week: 0 days    Minutes per session: 0 min  . Stress: Not at all  Relationships  . Social Herbalist on phone: Not on file    Gets together: Not on file    Attends religious service: Not on file    Active member of club or organization: Not on file    Attends meetings of clubs or organizations: Not on file    Relationship status: Not on file  . Intimate partner violence    Fear of current or ex partner: No    Emotionally abused: No    Physically abused: No    Forced sexual activity: No  Other Topics Concern  . Not on file  Social History Narrative  . Not on file     Review of Systems: General: negative for chills, fever, night sweats or weight changes.  Cardiovascular: negative for chest pain, dyspnea on exertion, edema, orthopnea, palpitations, paroxysmal nocturnal dyspnea or shortness of breath Dermatological: negative for rash Respiratory: negative for cough or wheezing Urologic: negative for hematuria Abdominal: negative for nausea, vomiting, diarrhea, bright red blood per rectum, melena, or hematemesis Neurologic: negative for visual changes, syncope, or dizziness All other systems reviewed and are otherwise negative except as noted above.    Blood pressure 134/64, pulse 86, temperature (!) 96.8 F (36 C), height '5\' 4"'$  (1.626 m), weight 160 lb (72.6 kg), SpO2 98 %.  General appearance: alert and no distress Neck: no adenopathy, no carotid bruit, no JVD, supple, symmetrical, trachea midline and thyroid not enlarged, symmetric, no tenderness/mass/nodules Lungs: clear to auscultation bilaterally Heart: regular rate and rhythm, S1, S2 normal, no murmur, click,  rub or gallop Extremities: extremities normal, atraumatic, no cyanosis or edema Pulses: 2+ and symmetric Skin: Skin color, texture, turgor normal. No rashes or lesions Neurologic: Alert and oriented X 3, normal strength and tone. Normal symmetric reflexes. Normal coordination and gait  EKG sinus rhythm at 75 without ST or T wave changes.  I personally reviewed this EKG.  ASSESSMENT AND PLAN:   PAD (peripheral artery disease), abnormal ABIs History of PAD with Dopplers performed 08/27/2018 revealing a right ABI 0.76 and a left of 0.88.  She does have a mildly elevated signal in her mid to distal right SFA with tibial vessel disease as well as tibial the vessel disease on the left.  She really denies claudication but does have symptoms of peripheral neuropathy and some pain in her feet.  I do not think she requires further work-up at this time.  Dyslipidemia History of dyslipidemia on  statin therapy with lipid profile performed 07/05/2018 revealing total cholesterol 145, LDL 63 and HDL 74.  Essential hypertension History of essential hypertension with blood pressure measured at 134/64.  She is on Micardis.      Lorretta Harp MD FACP,FACC,FAHA, [facility] 10/30/2018 4:24 PM

## 2018-10-30 NOTE — Patient Instructions (Signed)
Medication Instructions:  Your physician recommends that you continue on your current medications as directed. Please refer to the Current Medication list given to you today.  If you need a refill on your cardiac medications before your next appointment, please call your pharmacy.   Lab work: NONE If you have labs (blood work) drawn today and your tests are completely normal, you will receive your results only by: Marland Kitchen MyChart Message (if you have MyChart) OR . A paper copy in the mail If you have any lab test that is abnormal or we need to change your treatment, we will call you to review the results.  Testing/Procedures: NONE  Follow-Up: At [facility], you and your health needs are our priority.  As part of our continuing mission to provide you with exceptional heart care, we have created designated Provider Care Teams.  These Care Teams include your primary Cardiologist (physician) and Advanced Practice Providers (APPs -  Physician Assistants and Nurse Practitioners) who all work together to provide you with the care you need, when you need it. . You MAY SCHEDULE a follow up appointment AS NEEDED.  You may see Dr. Gwenlyn Found or one of the following Advanced Practice Providers on your designated Care Team:   . Kerin Ransom, PA-C . Daleen Snook Kroeger, PA-C . Sande Rives, PA-C . Almyra Deforest, PA-C . Fabian Sharp, PA-C . Jory Sims, DNP . Rosaria Ferries, PA-C

## 2018-10-30 NOTE — Assessment & Plan Note (Signed)
History of essential hypertension with blood pressure measured at 134/64.  She is on Micardis.

## 2018-10-30 NOTE — Assessment & Plan Note (Signed)
History of dyslipidemia on statin therapy with lipid profile performed 07/05/2018 revealing total cholesterol 145, LDL 63 and HDL 74.

## 2018-10-31 ENCOUNTER — Ambulatory Visit (INDEPENDENT_AMBULATORY_CARE_PROVIDER_SITE_OTHER): Payer: Medicare Other | Admitting: Internal Medicine

## 2018-10-31 ENCOUNTER — Encounter: Payer: Self-pay | Admitting: Internal Medicine

## 2018-10-31 VITALS — BP 128/68 | HR 91 | Temp 98.7°F | Ht 64.0 in | Wt 160.2 lb

## 2018-10-31 DIAGNOSIS — R202 Paresthesia of skin: Secondary | ICD-10-CM | POA: Diagnosis not present

## 2018-10-31 DIAGNOSIS — R350 Frequency of micturition: Secondary | ICD-10-CM | POA: Diagnosis not present

## 2018-10-31 DIAGNOSIS — J410 Simple chronic bronchitis: Secondary | ICD-10-CM | POA: Diagnosis not present

## 2018-10-31 LAB — POCT URINALYSIS DIPSTICK
Bilirubin, UA: NEGATIVE
Glucose, UA: NEGATIVE
Ketones, UA: NEGATIVE
Nitrite, UA: NEGATIVE
Protein, UA: NEGATIVE
Spec Grav, UA: 1.025 (ref 1.010–1.025)
Urobilinogen, UA: 0.2 E.U./dL
pH, UA: 5.5 (ref 5.0–8.0)

## 2018-10-31 MED ORDER — GABAPENTIN 100 MG PO CAPS: 100.0000 mg | ORAL_CAPSULE | Freq: Every day | ORAL | 1 refills | Status: DC

## 2018-10-31 MED ORDER — BENZONATATE 100 MG PO CAPS: 100.0000 mg | ORAL_CAPSULE | Freq: Two times a day (BID) | ORAL | 1 refills | Status: AC | PRN

## 2018-10-31 MED ORDER — BENZONATATE 100 MG PO CAPS: 100.0000 mg | ORAL_CAPSULE | Freq: Two times a day (BID) | ORAL | 1 refills | Status: DC | PRN

## 2018-11-01 LAB — URINE CULTURE

## 2018-11-05 ENCOUNTER — Ambulatory Visit: Payer: Medicare Other | Admitting: Internal Medicine

## 2018-11-09 DIAGNOSIS — M25512 Pain in left shoulder: Secondary | ICD-10-CM | POA: Diagnosis not present

## 2018-11-25 NOTE — Progress Notes (Signed)
Subjective:     Patient ID: Leah Wiggins , female    DOB: [birth year] , 85 y.o.   MRN: [id number]   Chief Complaint  Patient presents with  . Other    foot pain    HPI  She is brought in by her daughter today for further evaluation of foot pain. She c/o burning sensation in her lower extremities. Pain is worse at night. There is no pain with ambulation.     Past Medical History:  Diagnosis Date  . Arthritis    "shoulders" (05/04/2017)  . Breast cancer, right breast ([place]) 1991  . GERD (gastroesophageal reflux disease)   . Hyperlipidemia   . Hypertension   . Non-stress test nonreactive, 04/24/12, normal 12/10/2012  . OSA on CPAP    "don't wear it all the time" (05/04/2017)  . PVD (peripheral vascular disease) ([place])   . Tobacco use 12/10/2012  . Type II diabetes mellitus (HCC)      Family History  Problem Relation Age of Onset  . Heart disease Mother   . Stroke Mother   . Stroke Father   . Cancer Sister        GIST  . Cancer Brother        thyroid   . Diabetes Son   . Stroke Son      Current Outpatient Medications:  .  alendronate (FOSAMAX) 70 MG tablet, Take 1 tablet (70 mg total) by mouth every Wednesday. Take with a full glass of water on an empty stomach, Disp: 12 tablet, Rfl: 2 .  aspirin EC 81 MG tablet, Take 81 mg by mouth daily., Disp: , Rfl:  .  Blood Glucose Monitoring Suppl (ONETOUCH VERIO) w/Device KIT, Use as directed to check blood sugars 1 time per day dx: e11.65, Disp: 1 kit, Rfl: 1 .  clotrimazole-betamethasone (LOTRISONE) cream, Apply to affected area 2 times daily prn, Disp: 30 g, Rfl: 0 .  metFORMIN (GLUCOPHAGE-XR) 500 MG 24 hr tablet, TAKE 1 TABLET TWICE A DAY, Disp: 180 tablet, Rfl: 3 .  OneTouch Delica Lancets 88K MISC, Use as directed to check blood sugars 1 time per day dx: e11.65, Disp: 50 each, Rfl: 11 .  benzonatate (TESSALON PERLES) 100 MG capsule, Take 1 capsule (100 mg total) by mouth 2 (two) times daily as needed for cough., Disp: 30  capsule, Rfl: 1 .  EPINEPHrine 0.3 mg/0.3 mL IJ SOAJ injection, Inject 0.3 mLs (0.3 mg total) into the muscle once as needed (prn worsening angioedema)., Disp: 1 Device, Rfl: 0 .  ferrous sulfate 325 (65 FE) MG EC tablet, Take 1 tablet (325 mg total) by mouth 3 (three) times daily with meals., Disp: 90 tablet, Rfl: 2 .  gabapentin (NEURONTIN) 100 MG capsule, Take 1 capsule (100 mg total) by mouth at bedtime., Disp: 90 capsule, Rfl: 1 .  glucose blood (ONETOUCH VERIO) test strip, Use as instructed to check blood sugars 1 time per day dx: e11.65, Disp: 50 each, Rfl: 11 .  HYDROcodone-acetaminophen (NORCO/VICODIN) 5-325 MG tablet, Take 1 tablet by mouth every 4 (four) hours as needed. (Patient not taking: Reported on 10/30/2018), Disp: 10 tablet, Rfl: 0 .  MICARDIS 40 MG tablet, Take 1 tablet (40 mg total) by mouth daily., Disp: 90 tablet, Rfl: 2 .  mometasone (ELOCON) 0.1 % lotion, Apply 1 application topically daily as needed (IRRITATION). , Disp: , Rfl: 0 .  mupirocin ointment (BACTROBAN) 2 %, 1 application as needed. , Disp: , Rfl: 0 .  pantoprazole (PROTONIX)  40 MG tablet, Take 1 tablet (40 mg total) by mouth daily at 12 noon., Disp: 90 tablet, Rfl: 2 .  pravastatin (PRAVACHOL) 40 MG tablet, TAKE 1 TABLET DAILY, Disp: 90 tablet, Rfl: 3 .  tiotropium (SPIRIVA) 18 MCG inhalation capsule, Place 18 mcg into inhaler and inhale daily. , Disp: , Rfl:    Allergies  Allergen Reactions  . Bactrim [Sulfamethoxazole-Trimethoprim] Swelling    Marked angioedema requiring hospitalization  . Penicillins Other (See Comments)    Has patient had a PCN reaction causing immediate rash, facial/tongue/throat swelling, SOB or lightheadedness with hypotension: Unk Has patient had a PCN reaction causing severe rash involving mucus membranes or skin necrosis: Unk Has patient had a PCN reaction that required hospitalization: Unk Has patient had a PCN reaction occurring within the last 10 years: No If all of the above  answers are "NO", then may proceed with Cephalosporin use.     Review of Systems  Constitutional: Negative.   Respiratory: Positive for cough.   Cardiovascular: Negative.   Gastrointestinal: Negative.   Neurological: Negative.   Psychiatric/Behavioral: Negative.      Today's Vitals   10/31/18 1524  BP: 128/68  Pulse: 91  Temp: 98.7 F (37.1 C)  TempSrc: Oral  SpO2: 95%  Weight: 160 lb 3.2 oz (72.7 kg)  Height: '5\' 4"'$  (1.626 m)   Body mass index is 27.5 kg/m.   Objective:  Physical Exam Vitals signs and nursing note reviewed.  Constitutional:      Appearance: Normal appearance.  HENT:     Head: Normocephalic and atraumatic.  Cardiovascular:     Rate and Rhythm: Normal rate and regular rhythm.     Heart sounds: Normal heart sounds.  Pulmonary:     Effort: Pulmonary effort is normal.     Breath sounds: Rhonchi present.  Skin:    General: Skin is warm.  Neurological:     General: No focal deficit present.     Mental Status: She is alert.  Psychiatric:        Mood and Affect: Mood normal.        Behavior: Behavior normal.         Assessment And Plan:     1. Paresthesia of lower extremity  Her symptoms are suggestive of diabetic neuropathy.  I will start her on gabapentin '100mg'$  capsule nightly. She is encouraged to call me in a week to let me know how she is doing. She will rto in four to six weeks for re-evaluation.   2. Smokers' cough ([place])  She was given rx tessalon perles to use as needed.   - benzonatate (TESSALON PERLES) 100 MG capsule; Take 1 capsule (100 mg total) by mouth 2 (two) times daily as needed for cough.  Dispense: 30 capsule; Refill: 1  3. Urinary frequency  I will check urinalysis. I will treat accordingly.   - POCT Urinalysis Dipstick (81002) - Culture, Urine    Maximino Greenland, MD    THE PATIENT IS ENCOURAGED TO PRACTICE SOCIAL DISTANCING DUE TO THE COVID-19 PANDEMIC.

## 2018-12-21 ENCOUNTER — Telehealth: Payer: Self-pay

## 2018-12-24 DIAGNOSIS — E1151 Type 2 diabetes mellitus with diabetic peripheral angiopathy without gangrene: Secondary | ICD-10-CM | POA: Diagnosis not present

## 2018-12-24 DIAGNOSIS — B351 Tinea unguium: Secondary | ICD-10-CM | POA: Diagnosis not present

## 2018-12-25 ENCOUNTER — Other Ambulatory Visit: Payer: Self-pay | Admitting: *Deleted

## 2018-12-25 NOTE — Patient Outreach (Signed)
[place] [facility]) Care Management  12/25/2018  Leah Wiggins Jun 09, 1926 [id number]   Telephone Screen  Referral Date:  12/21/2018 Referral Source:  EMMI Prevent  Reason for Referral:  Screening Insurance:  Medicare   Outreach Attempt:  Successful telephone outreach to patient for telephone screening post EMMI prevent call.  HIPAA verified with patient.  [facility] services reviewed and discussed with patient.  Patient stating she does not feel she needs any of the services at this time.  Agrees to receive [facility] pamphlet and discuss services with daughter.  States daughter will call RN Health Coach back if they wish to enroll in services.  Declines screening and services at this time.  Encouraged patient or daughter to contact [facility] in the future if they are interested in services.  Plan:  RN Health Coach will send Successful Outreach letter with [facility] brochure.  RN Health Coach will close case based on patient declining services at this time.   [place] [phone] [email or web address]

## 2019-01-03 ENCOUNTER — Other Ambulatory Visit: Payer: Self-pay | Admitting: Internal Medicine

## 2019-01-17 ENCOUNTER — Telehealth: Payer: Self-pay

## 2019-01-17 NOTE — Telephone Encounter (Signed)
The pt called and said that she got a call from the office about signing up for a program and that she would like to sign up.  I asked the pt what program she was talking about and the pt said that she couldn't remember.  I asked the pt if she could remember the name of who called her and the pt said she couldn't remember their name. I told the pt that I would ask who called her and if I find out who it was I would let them know what the pt said.

## 2019-02-11 DIAGNOSIS — L2084 Intrinsic (allergic) eczema: Secondary | ICD-10-CM | POA: Diagnosis not present

## 2019-02-11 DIAGNOSIS — Z79899 Other long term (current) drug therapy: Secondary | ICD-10-CM | POA: Diagnosis not present

## 2019-02-11 DIAGNOSIS — L853 Xerosis cutis: Secondary | ICD-10-CM | POA: Diagnosis not present

## 2019-02-14 ENCOUNTER — Other Ambulatory Visit: Payer: Self-pay

## 2019-02-14 ENCOUNTER — Ambulatory Visit (INDEPENDENT_AMBULATORY_CARE_PROVIDER_SITE_OTHER): Payer: Medicare Other | Admitting: Internal Medicine

## 2019-02-14 ENCOUNTER — Encounter: Payer: Self-pay | Admitting: Internal Medicine

## 2019-02-14 ENCOUNTER — Ambulatory Visit (INDEPENDENT_AMBULATORY_CARE_PROVIDER_SITE_OTHER): Payer: Medicare Other

## 2019-02-14 VITALS — BP 150/70 | HR 79 | Temp 98.0°F | Ht 59.5 in | Wt 161.2 lb

## 2019-02-14 VITALS — BP 150/70 | HR 79 | Temp 98.0°F | Ht 59.8 in | Wt 161.2 lb

## 2019-02-14 DIAGNOSIS — D696 Thrombocytopenia, unspecified: Secondary | ICD-10-CM

## 2019-02-14 DIAGNOSIS — E236 Other disorders of pituitary gland: Secondary | ICD-10-CM

## 2019-02-14 DIAGNOSIS — E1122 Type 2 diabetes mellitus with diabetic chronic kidney disease: Secondary | ICD-10-CM

## 2019-02-14 DIAGNOSIS — E119 Type 2 diabetes mellitus without complications: Secondary | ICD-10-CM | POA: Diagnosis not present

## 2019-02-14 DIAGNOSIS — Z0000 Encounter for general adult medical examination without abnormal findings: Secondary | ICD-10-CM | POA: Diagnosis not present

## 2019-02-14 DIAGNOSIS — M79671 Pain in right foot: Secondary | ICD-10-CM | POA: Diagnosis not present

## 2019-02-14 DIAGNOSIS — E6609 Other obesity due to excess calories: Secondary | ICD-10-CM

## 2019-02-14 DIAGNOSIS — I129 Hypertensive chronic kidney disease with stage 1 through stage 4 chronic kidney disease, or unspecified chronic kidney disease: Secondary | ICD-10-CM

## 2019-02-14 DIAGNOSIS — N182 Chronic kidney disease, stage 2 (mild): Secondary | ICD-10-CM

## 2019-02-14 DIAGNOSIS — E66811 Obesity, class 1: Secondary | ICD-10-CM

## 2019-02-14 DIAGNOSIS — Z6832 Body mass index (BMI) 32.0-32.9, adult: Secondary | ICD-10-CM

## 2019-02-14 LAB — POCT UA - MICROALBUMIN
Albumin/Creatinine Ratio, Urine, POC: <30
Creatinine, POC: 200 mg/dL
Microalbumin Ur, POC: 10 mg/L

## 2019-02-14 NOTE — Progress Notes (Signed)
This visit occurred during the SARS-CoV-2 public health emergency.  Safety protocols were in place, including screening questions prior to the visit, additional usage of staff PPE, and extensive cleaning of exam room while observing appropriate contact time as indicated for disinfecting solutions.  Subjective:   Leah Wiggins is a 85 y.o. female who presents for Medicare Annual (Subsequent) preventive examination.  Review of Systems:  n/a Cardiac Risk Factors include: advanced age (>26mn, >>13women);diabetes mellitus;dyslipidemia;hypertension;obesity (BMI >30kg/m2);sedentary lifestyle     Objective:     Vitals: BP (!) 150/70 (BP Location: Left Arm, Patient Position: Sitting, Cuff Size: Normal)   Pulse 79   Temp 98 F (36.7 C) (Oral)   Ht 4' 11.8" (1.519 m)   Wt 161 lb 3.2 oz (73.1 kg)   SpO2 96%   BMI 31.69 kg/m   Body mass index is 31.69 kg/m.  Advanced Directives 02/14/2019 05/05/2018 02/07/2018 02/07/2018 01/22/2018 01/19/2018 05/04/2017  Does Patient Have a Medical Advance Directive? No No No No No No No  Would patient like information on creating a medical advance directive? Yes (MAU/Ambulatory/Procedural Areas - Information given) No - Patient declined Yes (MAU/Ambulatory/Procedural Areas - Information given) No - Patient declined - - No - Patient declined    Tobacco Social History   Tobacco Use  Smoking Status Current Some Day Smoker  . Packs/day: 0.25  . Years: 70.00  . Pack years: 17.50  . Types: Cigarettes  . Start date: 03/08/1947  Smokeless Tobacco Never Used  Tobacco Comment   has tried cold [place]    Ready to quit: Yes Counseling given: Yes Comment: has tried cold [place]  Clinical Intake:  Pre-visit preparation completed: Yes  Pain : No/denies pain     Nutritional Status: BMI > 30  Obese Nutritional Risks: None Diabetes: Yes CBG done?: No Did pt. bring in CBG monitor from home?: No  How often do you need to have someone help you when  you read instructions, pamphlets, or other written materials from your doctor or pharmacy?: 1 - Never What is the last grade level you completed in school?: 12th grade  Interpreter Needed?: No  Information entered by :: NAllen LPN  Past Medical History:  Diagnosis Date  . Arthritis    "shoulders" (05/04/2017)  . [place]  . GERD (gastroesophageal reflux disease)   . Hyperlipidemia   . Hypertension   . Non-stress test nonreactive, 04/24/12, normal 12/10/2012  . OSA on CPAP    "don't wear it all the time" (05/04/2017)  . [place] disease) [place]   . Tobacco use 12/10/2012  . [place]    Past Surgical History:  Procedure Laterality Date  . APPENDECTOMY    . BIOPSY  01/19/2018   Procedure: BIOPSY;  Surgeon: HCarol Ada MD;  Location: [facility];  Service: Endoscopy;;  . BREAST BIOPSY Right 1991  . CARDIOVASCULAR STRESS TEST  04/24/2012   No significant ST segment change suggestive of ischemia.  .Marland KitchenCATARACT EXTRACTION, BILATERAL Bilateral   . COLONOSCOPY WITH PROPOFOL N/A 01/19/2018   Procedure: COLONOSCOPY WITH PROPOFOL;  Surgeon: HCarol Ada MD;  Location: [facility];  Service: Endoscopy;  Laterality: N/A;  . ESOPHAGOGASTRODUODENOSCOPY (EGD) WITH PROPOFOL N/A 01/19/2018   Procedure: ESOPHAGOGASTRODUODENOSCOPY (EGD) WITH PROPOFOL;  Surgeon: HCarol Ada MD;  Location: [facility];  Service: Endoscopy;  Laterality: N/A;  . JOINT REPLACEMENT    . LOWER EXTREMITY ARTERIAL DOPPLER Bilateral 05/07/2012   Left ABI-demonstrated mild arterial  insufficiency. Right CIA 50-69% diameter reduction. Right SFA less than 50% diameter reduction. Left SFA 70-99% diameter reduction. Bilateral Runoff-Posterior tibial arteries appeared occluded.  Marland Kitchen MASTECTOMY PARTIAL / LUMPECTOMY W/ AXILLARY LYMPHADENECTOMY Right 1991   . TOTAL HIP ARTHROPLASTY Right 2011   Family History  Problem Relation Age of Onset  . Heart disease Mother    . Stroke Mother   . Stroke Father   . Cancer Sister        GIST  . Cancer Brother        thyroid   . Diabetes Son   . Stroke Son    Social History   Socioeconomic History  . Marital status: Widowed    Spouse name: Not on file  . Number of children: Not on file  . Years of education: Not on file  . Highest education level: Not on file  Occupational History  . Occupation: retired  Tobacco Use  . Smoking status: Current Some Day Smoker    Packs/day: 0.25    Years: 70.00    Pack years: 17.50    Types: Cigarettes    Start date: 03/08/1947  . Smokeless tobacco: Never Used  . Tobacco comment: has tried cold [place]  Substance and Sexual Activity  . Alcohol use: No    Alcohol/week: 0.0 standard drinks  . Drug use: No  . Sexual activity: Not Currently  Other Topics Concern  . Not on file  Social History Narrative  . Not on file   Social Determinants of Health   Financial Resource Strain: Low Risk   . Difficulty of Paying Living Expenses: Not hard at all  Food Insecurity: No Food Insecurity  . Worried About Charity fundraiser in the Last Year: Never true  . Ran Out of Food in the Last Year: Never true  Transportation Needs: No Transportation Needs  . Lack of Transportation (Medical): No  . Lack of Transportation (Non-Medical): No  Physical Activity: Inactive  . Days of Exercise per Week: 0 days  . Minutes of Exercise per Session: 0 min  Stress: No Stress Concern Present  . Feeling of Stress : Not at all  Social Connections:   . Frequency of Communication with Friends and Family: Not on file  . Frequency of Social Gatherings with Friends and Family: Not on file  . Attends Religious Services: Not on file  . Active Member of Clubs or Organizations: Not on file  . Attends Archivist Meetings: Not on file  . Marital Status: Not on file    Outpatient Encounter Medications as of 02/14/2019  Medication Sig  . alendronate (FOSAMAX) 70 MG tablet TAKE 1 TABLET  EVERY WEDNESDAY WITH A FULL GLASS OF WATER ON AN EMPTY STOMACH  . aspirin EC 81 MG tablet Take 81 mg by mouth daily.  . Blood Glucose Monitoring Suppl (ONETOUCH VERIO) w/Device KIT Use as directed to check blood sugars 1 time per day dx: e11.65  . clotrimazole-betamethasone (LOTRISONE) cream Apply to affected area 2 times daily prn  . EPINEPHrine 0.3 mg/0.3 mL IJ SOAJ injection Inject 0.3 mLs (0.3 mg total) into the muscle once as needed (prn worsening angioedema).  . gabapentin (NEURONTIN) 100 MG capsule Take 1 capsule (100 mg total) by mouth at bedtime.  Marland Kitchen glucose blood (ONETOUCH VERIO) test strip Use as instructed to check blood sugars 1 time per day dx: e11.65  . metFORMIN (GLUCOPHAGE-XR) 500 MG 24 hr tablet TAKE 1 TABLET TWICE A DAY  . MICARDIS 40 MG  tablet TAKE 1 TABLET DAILY  . mometasone (ELOCON) 0.1 % lotion Apply 1 application topically daily as needed (IRRITATION).   Marland Kitchen mupirocin ointment (BACTROBAN) 2 % 1 application as needed.   Glory Rosebush Delica Lancets 83M MISC Use as directed to check blood sugars 1 time per day dx: e11.65  . pantoprazole (PROTONIX) 40 MG tablet TAKE 1 TABLET DAILY AT 12 NOON  . pravastatin (PRAVACHOL) 40 MG tablet TAKE 1 TABLET DAILY  . tiotropium (SPIRIVA) 18 MCG inhalation capsule Place 18 mcg into inhaler and inhale daily.   . benzonatate (TESSALON PERLES) 100 MG capsule Take 1 capsule (100 mg total) by mouth 2 (two) times daily as needed for cough. (Patient not taking: Reported on 02/14/2019)  . ferrous sulfate 325 (65 FE) MG EC tablet Take 1 tablet (325 mg total) by mouth 3 (three) times daily with meals.  Marland Kitchen HYDROcodone-acetaminophen (NORCO/VICODIN) 5-325 MG tablet Take 1 tablet by mouth every 4 (four) hours as needed. (Patient not taking: Reported on 10/30/2018)   No facility-administered encounter medications on file as of 02/14/2019.    Activities of Daily Living In your present state of health, do you have any difficulty performing the following  activities: 02/14/2019  Hearing? Y  Comment wears hearing aides  Vision? Y  Comment sometimes gets a little blurry  Difficulty concentrating or making decisions? Y  Comment sometimes  Walking or climbing stairs? Y  Comment gets tired  Dressing or bathing? N  Doing errands, shopping? N  Preparing Food and eating ? N  Using the Toilet? N  In the past six months, have you accidently leaked urine? N  Do you have problems with loss of bowel control? N  Managing your Medications? N  Managing your Finances? N  Housekeeping or managing your Housekeeping? N  Some recent data might be hidden    Patient Care Team: Glendale Chard, MD as PCP - General (Internal Medicine)    Assessment:   This is a routine wellness examination for Leah Wiggins.  Exercise Activities and Dietary recommendations Current Exercise Habits: The patient does not participate in regular exercise at present  Goals    . Patient Stated     02/14/2019, no goals       Fall Risk Fall Risk  02/14/2019 10/31/2018 04/06/2018 02/07/2018 04/15/2013  Falls in the past year? 0 1 0 0 No  Number falls in past yr: - 0 - - -  Injury with Fall? - 1 - - -  Risk for fall due to : Medication side effect - - Medication side effect Impaired balance/gait  Follow up Falls evaluation completed;Education provided;Falls prevention discussed - - Falls prevention discussed -   Is the patient's home free of loose throw rugs in walkways, pet beds, electrical cords, etc?   yes      Grab bars in the bathroom? yes      Handrails on the stairs?   n/a      Adequate lighting?   yes  Timed Get Up and Go performed: n/a  Depression Screen PHQ 2/9 Scores 02/14/2019 02/07/2018 04/15/2013  PHQ - 2 Score 0 0 0  PHQ- 9 Score 0 - -     Cognitive Function     6CIT Screen 02/14/2019 02/07/2018  What Year? 0 points 0 points  What month? 0 points 0 points  What time? 3 points 0 points  Count back from 20 0 points 0 points  Months in reverse 4 points 4  points  Repeat phrase 10  points 2 points  Total Score 17 6    Immunization History  Administered Date(s) Administered  . Influenza Whole 02/16/2010  . Pneumococcal-Unspecified 06/19/2013    Qualifies for Shingles Vaccine? yes  Screening Tests Health Maintenance  Topic Date Due  . INFLUENZA VACCINE  06/05/2019 (Originally 10/06/2018)  . TETANUS/TDAP  02/14/2020 (Originally 11/20/1945)  . HEMOGLOBIN A1C  08/15/2019  . OPHTHALMOLOGY EXAM  08/27/2019  . FOOT EXAM  10/31/2019  . DEXA SCAN  Completed  . PNA vac Low Risk Adult  Completed    Cancer Screenings: Lung: Low Dose CT Chest recommended if Age 18-80 years, 30 pack-year currently smoking OR have quit w/in 15years. Patient does not qualify. Breast:  Up to date on Mammogram? Yes   Up to date of Bone Density/Dexa? Yes Colorectal: not required  Additional Screenings: : Hepatitis C Screening: n/a     Plan:    Patient has no goals set at this time.   I have personally reviewed and noted the following in the patient's chart:   . Medical and social history . Use of alcohol, tobacco or illicit drugs  . Current medications and supplements . Functional ability and status . Nutritional status . Physical activity . Advanced directives . List of other physicians . Hospitalizations, surgeries, and ER visits in previous 12 months . Vitals . Screenings to include cognitive, depression, and falls . Referrals and appointments  In addition, I have reviewed and discussed with patient certain preventive protocols, quality metrics, and best practice recommendations. A written personalized care plan for preventive services as well as general preventive health recommendations were provided to patient.     Kellie Simmering, LPN  [id number]

## 2019-02-14 NOTE — Patient Instructions (Signed)
Leah Wiggins , Thank you for taking time to come for your Medicare Wellness Visit. I appreciate your ongoing commitment to your health goals. Please review the following plan we discussed and let me know if I can assist you in the future.   Screening recommendations/referrals: Colonoscopy: not required Mammogram: not required Bone Density: 02/2014 Recommended yearly ophthalmology/optometry visit for glaucoma screening and checkup Recommended yearly dental visit for hygiene and checkup  Vaccinations: Influenza vaccine: decline Pneumococcal vaccine: 10/2016 Tdap vaccine: decline Shingles vaccine: discussed    Advanced directives: Advance directive discussed with you today. I have provided a copy for you to complete at home and have notarized. Once this is complete please bring a copy in to our office so we can scan it into your chart.   Conditions/risks identified: obesity, smoking  Next appointment:    Preventive Care 36 Years and Older, Female Preventive care refers to lifestyle choices and visits with your health care provider that can promote health and wellness. What does preventive care include?  A yearly physical exam. This is also called an annual well check.  Dental exams once or twice a year.  Routine eye exams. Ask your health care provider how often you should have your eyes checked.  Personal lifestyle choices, including:  Daily care of your teeth and gums.  Regular physical activity.  Eating a healthy diet.  Avoiding tobacco and drug use.  Limiting alcohol use.  Practicing safe sex.  Taking low-dose aspirin every day.  Taking vitamin and mineral supplements as recommended by your health care provider. What happens during an annual well check? The services and screenings done by your health care provider during your annual well check will depend on your age, overall health, lifestyle risk factors, and family history of disease. Counseling  Your health  care provider may ask you questions about your:  Alcohol use.  Tobacco use.  Drug use.  Emotional well-being.  Home and relationship well-being.  Sexual activity.  Eating habits.  History of falls.  Memory and ability to understand (cognition).  Work and work Statistician.  Reproductive health. Screening  You may have the following tests or measurements:  Height, weight, and BMI.  Blood pressure.  Lipid and cholesterol levels. These may be checked every 5 years, or more frequently if you are over 83 years old.  Skin check.  Lung cancer screening. You may have this screening every year starting at age 83 if you have a 30-pack-year history of smoking and currently smoke or have quit within the past 15 years.  Fecal occult blood test (FOBT) of the stool. You may have this test every year starting at age 833.  Flexible sigmoidoscopy or colonoscopy. You may have a sigmoidoscopy every 5 years or a colonoscopy every 10 years starting at age 83.  Hepatitis C blood test.  Hepatitis B blood test.  Sexually transmitted disease (STD) testing.  Diabetes screening. This is done by checking your blood sugar (glucose) after you have not eaten for a while (fasting). You may have this done every 1-3 years.  Bone density scan. This is done to screen for osteoporosis. You may have this done starting at age 83.  Mammogram. This may be done every 1-2 years. Talk to your health care provider about how often you should have regular mammograms. Talk with your health care provider about your test results, treatment options, and if necessary, the need for more tests. Vaccines  Your health care provider may recommend certain vaccines, such as:  Influenza vaccine. This is recommended every year.  Tetanus, diphtheria, and acellular pertussis (Tdap, Td) vaccine. You may need a Td booster every 10 years.  Zoster vaccine. You may need this after age 83.  Pneumococcal 13-valent conjugate  (PCV13) vaccine. One dose is recommended after age 83.  Pneumococcal polysaccharide (PPSV23) vaccine. One dose is recommended after age 83. Talk to your health care provider about which screenings and vaccines you need and how often you need them. This information is not intended to replace advice given to you by your health care provider. Make sure you discuss any questions you have with your health care provider. Document Released: 03/20/2015 Document Revised: 11/11/2015 Document Reviewed: 12/23/2014 Elsevier Interactive Patient Education  2017 [place] Prevention in the Home Falls can cause injuries. They can happen to people of all ages. There are many things you can do to make your home safe and to help prevent falls. What can I do on the outside of my home?  Regularly fix the edges of walkways and driveways and fix any cracks.  Remove anything that might make you trip as you walk through a door, such as a raised step or threshold.  Trim any bushes or trees on the path to your home.  Use bright outdoor lighting.  Clear any walking paths of anything that might make someone trip, such as rocks or tools.  Regularly check to see if handrails are loose or broken. Make sure that both sides of any steps have handrails.  Any raised decks and porches should have guardrails on the edges.  Have any leaves, snow, or ice cleared regularly.  Use sand or salt on walking paths during winter.  Clean up any spills in your garage right away. This includes oil or grease spills. What can I do in the bathroom?  Use night lights.  Install grab bars by the toilet and in the tub and shower. Do not use towel bars as grab bars.  Use non-skid mats or decals in the tub or shower.  If you need to sit down in the shower, use a plastic, non-slip stool.  Keep the floor dry. Clean up any water that spills on the floor as soon as it happens.  Remove soap buildup in the tub or shower  regularly.  Attach bath mats securely with double-sided non-slip rug tape.  Do not have throw rugs and other things on the floor that can make you trip. What can I do in the bedroom?  Use night lights.  Make sure that you have a light by your bed that is easy to reach.  Do not use any sheets or blankets that are too big for your bed. They should not hang down onto the floor.  Have a firm chair that has side arms. You can use this for support while you get dressed.  Do not have throw rugs and other things on the floor that can make you trip. What can I do in the kitchen?  Clean up any spills right away.  Avoid walking on wet floors.  Keep items that you use a lot in easy-to-reach places.  If you need to reach something above you, use a strong step stool that has a grab bar.  Keep electrical cords out of the way.  Do not use floor polish or wax that makes floors slippery. If you must use wax, use non-skid floor wax.  Do not have throw rugs and other things on the floor that  can make you trip. What can I do with my stairs?  Do not leave any items on the stairs.  Make sure that there are handrails on both sides of the stairs and use them. Fix handrails that are broken or loose. Make sure that handrails are as long as the stairways.  Check any carpeting to make sure that it is firmly attached to the stairs. Fix any carpet that is loose or worn.  Avoid having throw rugs at the top or bottom of the stairs. If you do have throw rugs, attach them to the floor with carpet tape.  Make sure that you have a light switch at the top of the stairs and the bottom of the stairs. If you do not have them, ask someone to add them for you. What else can I do to help prevent falls?  Wear shoes that:  Do not have high heels.  Have rubber bottoms.  Are comfortable and fit you well.  Are closed at the toe. Do not wear sandals.  If you use a stepladder:  Make sure that it is fully  opened. Do not climb a closed stepladder.  Make sure that both sides of the stepladder are locked into place.  Ask someone to hold it for you, if possible.  Clearly mark and make sure that you can see:  Any grab bars or handrails.  First and last steps.  Where the edge of each step is.  Use tools that help you move around (mobility aids) if they are needed. These include:  Canes.  Walkers.  Scooters.  Crutches.  Turn on the lights when you go into a dark area. Replace any light bulbs as soon as they burn out.  Set up your furniture so you have a clear path. Avoid moving your furniture around.  If any of your floors are uneven, fix them.  If there are any pets around you, be aware of where they are.  Review your medicines with your doctor. Some medicines can make you feel dizzy. This can increase your chance of falling. Ask your doctor what other things that you can do to help prevent falls. This information is not intended to replace advice given to you by your health care provider. Make sure you discuss any questions you have with your health care provider. Document Released: 12/18/2008 Document Revised: 07/30/2015 Document Reviewed: 03/28/2014 Elsevier Interactive Patient Education  2017 [place] with Quitting Smoking  Quitting smoking is a physical and mental challenge. You will face cravings, withdrawal symptoms, and temptation. Before quitting, work with your health care provider to make a plan that can help you cope. Preparation can help you quit and keep you from giving in. How can I cope with cravings? Cravings usually last for 5-10 minutes. If you get through it, the craving will pass. Consider taking the following actions to help you cope with cravings:  Keep your mouth busy: ? Chew sugar-free gum. ? Suck on hard candies or a straw. ? Brush your teeth.  Keep your hands and body busy: ? Immediately change to a different activity when you feel  a craving. ? Squeeze or play with a ball. ? Do an activity or a hobby, like making bead jewelry, practicing needlepoint, or working with wood. ? Mix up your normal routine. ? Take a short exercise break. Go for a quick walk or run up and down stairs. ? Spend time in public places where smoking is not allowed.  Focus on doing something kind or helpful for someone else.  Call a friend or family member to talk during a craving.  Join a support group.  Call a quit line, such as 1-800-QUIT-NOW.  Talk with your health care provider about medicines that might help you cope with cravings and make quitting easier for you. How can I deal with withdrawal symptoms? Your body may experience negative effects as it tries to get used to not having nicotine in the system. These effects are called withdrawal symptoms. They may include:  Feeling hungrier than normal.  Trouble concentrating.  Irritability.  Trouble sleeping.  Feeling depressed.  Restlessness and agitation.  Craving a cigarette. To manage withdrawal symptoms:  Avoid places, people, and activities that trigger your cravings.  Remember why you want to quit.  Get plenty of sleep.  Avoid coffee and other caffeinated drinks. These may worsen some of your symptoms. How can I handle social situations? Social situations can be difficult when you are quitting smoking, especially in the first few weeks. To manage this, you can:  Avoid parties, bars, and other social situations where people might be smoking.  Avoid alcohol.  Leave right away if you have the urge to smoke.  Explain to your family and friends that you are quitting smoking. Ask for understanding and support.  Plan activities with friends or family where smoking is not an option. What are some ways I can cope with stress? Wanting to smoke may cause stress, and stress can make you want to smoke. Find ways to manage your stress. Relaxation techniques can help. For  example:  Breathe slowly and deeply, in through your nose and out through your mouth.  Listen to soothing, relaxing music.  Talk with a family member or friend about your stress.  Light a candle.  Soak in a bath or take a shower.  Think about a peaceful place. What are some ways I can prevent weight gain? Be aware that many people gain weight after they quit smoking. However, not everyone does. To keep from gaining weight, have a plan in place before you quit and stick to the plan after you quit. Your plan should include:  Having healthy snacks. When you have a craving, it may help to: ? Eat plain popcorn, crunchy carrots, celery, or other cut vegetables. ? Chew sugar-free gum.  Changing how you eat: ? Eat small portion sizes at meals. ? Eat 4-6 small meals throughout the day instead of 1-2 large meals a day. ? Be mindful when you eat. Do not watch television or do other things that might distract you as you eat.  Exercising regularly: ? Make time to exercise each day. If you do not have time for a long workout, do short bouts of exercise for 5-10 minutes several times a day. ? Do some form of strengthening exercise, like weight lifting, and some form of aerobic exercise, like running or swimming.  Drinking plenty of water or other low-calorie or no-calorie drinks. Drink 6-8 glasses of water daily, or as much as instructed by your health care provider. Summary  Quitting smoking is a physical and mental challenge. You will face cravings, withdrawal symptoms, and temptation to smoke again. Preparation can help you as you go through these challenges.  You can cope with cravings by keeping your mouth busy (such as by chewing gum), keeping your body and hands busy, and making calls to family, friends, or a helpline for people who want to quit smoking.  You can cope with withdrawal symptoms by avoiding places where people smoke, avoiding drinks with caffeine, and getting plenty of rest.   Ask your health care provider about the different ways to prevent weight gain, avoid stress, and handle social situations. This information is not intended to replace advice given to you by your health care provider. Make sure you discuss any questions you have with your health care provider. Document Released: 02/19/2016 Document Revised: 02/03/2017 Document Reviewed: 02/19/2016 Elsevier Patient Education  2020 [employer].

## 2019-02-15 LAB — CMP14+EGFR
ALT: 12 IU/L (ref 0–32)
AST: 14 IU/L (ref 0–40)
Albumin/Globulin Ratio: 1.7 (ref 1.2–2.2)
Albumin: 4 g/dL (ref 3.5–4.6)
Alkaline Phosphatase: 85 IU/L (ref 39–117)
BUN/Creatinine Ratio: 24 (ref 12–28)
BUN: 22 mg/dL (ref 10–36)
Bilirubin Total: <0.2 mg/dL (ref 0.0–1.2)
CO2: 25 mmol/L (ref 20–29)
Calcium: 9.2 mg/dL (ref 8.7–10.3)
Chloride: 107 mmol/L — ABNORMAL HIGH (ref 96–106)
Creatinine, Ser: 0.92 mg/dL (ref 0.57–1.00)
GFR calc Af Amer: 63 mL/min/{1.73_m2} (ref >59)
GFR calc non Af Amer: 55 mL/min/{1.73_m2} — ABNORMAL LOW (ref >59)
Globulin, Total: 2.3 g/dL (ref 1.5–4.5)
Glucose: 98 mg/dL (ref 65–99)
Potassium: 4.9 mmol/L (ref 3.5–5.2)
Sodium: 144 mmol/L (ref 134–144)
Total Protein: 6.3 g/dL (ref 6.0–8.5)

## 2019-02-15 LAB — HEMOGLOBIN A1C
Est. average glucose Bld gHb Est-mCnc: 117 mg/dL
Hgb A1c MFr Bld: 5.7 % — ABNORMAL HIGH (ref 4.8–5.6)

## 2019-02-15 LAB — CBC
Hematocrit: 36.1 % (ref 34.0–46.6)
Hemoglobin: 11.3 g/dL (ref 11.1–15.9)
MCH: 24.4 pg — ABNORMAL LOW (ref 26.6–33.0)
MCHC: 31.3 g/dL — ABNORMAL LOW (ref 31.5–35.7)
MCV: 78 fL — ABNORMAL LOW (ref 79–97)
RBC: 4.63 x10E6/uL (ref 3.77–5.28)
RDW: 17 % — ABNORMAL HIGH (ref 11.7–15.4)
WBC: 5.2 10*3/uL (ref 3.4–10.8)

## 2019-02-15 LAB — LIPID PANEL
Chol/HDL Ratio: 2.1 ratio (ref 0.0–4.4)
Cholesterol, Total: 151 mg/dL (ref 100–199)
HDL: 72 mg/dL (ref >39)
LDL Chol Calc (NIH): 69 mg/dL (ref 0–99)
Triglycerides: 45 mg/dL (ref 0–149)
VLDL Cholesterol Cal: 10 mg/dL (ref 5–40)

## 2019-02-17 NOTE — Progress Notes (Signed)
This visit occurred during the SARS-CoV-2 public health emergency.  Safety protocols were in place, including screening questions prior to the visit, additional usage of staff PPE, and extensive cleaning of exam room while observing appropriate contact time as indicated for disinfecting solutions.  Subjective:     Patient ID: Leah Wiggins , female    DOB: [birth year] , 85 y.o.   MRN: [id number]   Chief Complaint  Patient presents with  . Hypertension  . Diabetes    HPI  She presents today for bp check. She feels well on current meds. Admits she has yet to take her meds today. She is accompanied by her daughter.   Hypertension This is a chronic problem. The current episode started more than 1 year ago. The problem has been gradually improving since onset. The problem is controlled. Pertinent negatives include no blurred vision, chest pain, palpitations or shortness of breath. Risk factors for coronary artery disease include diabetes mellitus, dyslipidemia, post-menopausal state and sedentary lifestyle.  Diabetes She presents for her follow-up diabetic visit. She has type 2 diabetes mellitus. Her disease course has been stable. There are no hypoglycemic associated symptoms. Pertinent negatives for diabetes include no blurred vision and no chest pain. There are no hypoglycemic complications.     Past Medical History:  Diagnosis Date  . Arthritis    "shoulders" (05/04/2017)  . Breast cancer, right breast ([place]) 1991  . GERD (gastroesophageal reflux disease)   . Hyperlipidemia   . Hypertension   . Non-stress test nonreactive, 04/24/12, normal 12/10/2012  . OSA on CPAP    "don't wear it all the time" (05/04/2017)  . PVD (peripheral vascular disease) ([place])   . Tobacco use 12/10/2012  . Type II diabetes mellitus (HCC)      Family History  Problem Relation Age of Onset  . Heart disease Mother   . Stroke Mother   . Stroke Father   . Cancer Sister        GIST  . Cancer Brother         thyroid   . Diabetes Son   . Stroke Son      Current Outpatient Medications:  .  alendronate (FOSAMAX) 70 MG tablet, TAKE 1 TABLET EVERY WEDNESDAY WITH A FULL GLASS OF WATER ON AN EMPTY STOMACH, Disp: 12 tablet, Rfl: 3 .  aspirin EC 81 MG tablet, Take 81 mg by mouth daily., Disp: , Rfl:  .  benzonatate (TESSALON PERLES) 100 MG capsule, Take 1 capsule (100 mg total) by mouth 2 (two) times daily as needed for cough. (Patient not taking: Reported on 02/14/2019), Disp: 30 capsule, Rfl: 1 .  Blood Glucose Monitoring Suppl (ONETOUCH VERIO) w/Device KIT, Use as directed to check blood sugars 1 time per day dx: e11.65, Disp: 1 kit, Rfl: 1 .  clotrimazole-betamethasone (LOTRISONE) cream, Apply to affected area 2 times daily prn, Disp: 30 g, Rfl: 0 .  EPINEPHrine 0.3 mg/0.3 mL IJ SOAJ injection, Inject 0.3 mLs (0.3 mg total) into the muscle once as needed (prn worsening angioedema)., Disp: 1 Device, Rfl: 0 .  ferrous sulfate 325 (65 FE) MG EC tablet, Take 1 tablet (325 mg total) by mouth 3 (three) times daily with meals., Disp: 90 tablet, Rfl: 2 .  gabapentin (NEURONTIN) 100 MG capsule, Take 1 capsule (100 mg total) by mouth at bedtime., Disp: 90 capsule, Rfl: 1 .  glucose blood (ONETOUCH VERIO) test strip, Use as instructed to check blood sugars 1 time per day dx: e11.65, Disp: 50  each, Rfl: 11 .  HYDROcodone-acetaminophen (NORCO/VICODIN) 5-325 MG tablet, Take 1 tablet by mouth every 4 (four) hours as needed. (Patient not taking: Reported on 10/30/2018), Disp: 10 tablet, Rfl: 0 .  metFORMIN (GLUCOPHAGE-XR) 500 MG 24 hr tablet, TAKE 1 TABLET TWICE A DAY, Disp: 180 tablet, Rfl: 3 .  MICARDIS 40 MG tablet, TAKE 1 TABLET DAILY, Disp: 90 tablet, Rfl: 3 .  mometasone (ELOCON) 0.1 % lotion, Apply 1 application topically daily as needed (IRRITATION). , Disp: , Rfl: 0 .  mupirocin ointment (BACTROBAN) 2 %, 1 application as needed. , Disp: , Rfl: 0 .  OneTouch Delica Lancets 21Y MISC, Use as directed to check  blood sugars 1 time per day dx: e11.65, Disp: 50 each, Rfl: 11 .  pantoprazole (PROTONIX) 40 MG tablet, TAKE 1 TABLET DAILY AT 12 NOON, Disp: 90 tablet, Rfl: 3 .  pravastatin (PRAVACHOL) 40 MG tablet, TAKE 1 TABLET DAILY, Disp: 90 tablet, Rfl: 3 .  tiotropium (SPIRIVA) 18 MCG inhalation capsule, Place 18 mcg into inhaler and inhale daily. , Disp: , Rfl:    Allergies  Allergen Reactions  . Bactrim [Sulfamethoxazole-Trimethoprim] Swelling    Marked angioedema requiring hospitalization  . Penicillins Other (See Comments)    Has patient had a PCN reaction causing immediate rash, facial/tongue/throat swelling, SOB or lightheadedness with hypotension: Unk Has patient had a PCN reaction causing severe rash involving mucus membranes or skin necrosis: Unk Has patient had a PCN reaction that required hospitalization: Unk Has patient had a PCN reaction occurring within the last 10 years: No If all of the above answers are "NO", then may proceed with Cephalosporin use.     Review of Systems  Constitutional: Negative.   Eyes: Negative for blurred vision.  Respiratory: Negative.  Negative for shortness of breath.   Cardiovascular: Negative.  Negative for chest pain and palpitations.  Gastrointestinal: Negative.   Musculoskeletal: Positive for arthralgias.       She c/o r foot pain. She denies fall/trauma. There is some pain with ambulation.   Neurological: Negative.   Psychiatric/Behavioral: Negative.      Today's Vitals   02/14/19 0858  BP: (!) 150/70  Pulse: 79  Temp: 98 F (36.7 C)  TempSrc: Oral  Weight: 161 lb 3.2 oz (73.1 kg)  Height: 4' 11.5" (1.511 m)  PainSc: 0-No pain   Body mass index is 32.01 kg/m.   Objective:  Physical Exam Vitals and nursing note reviewed.  Constitutional:      Appearance: Normal appearance.  HENT:     Head: Normocephalic and atraumatic.  Cardiovascular:     Rate and Rhythm: Normal rate and regular rhythm.     Heart sounds: Normal heart sounds.   Pulmonary:     Effort: Pulmonary effort is normal.     Comments: DECREASED BREATH SOUNDS AT BASES Feet:     Right foot:     Skin integrity: Callus and dry skin present.     Comments: Top of right foot is tender to touch. There is no overlying erythe Skin:    General: Skin is warm.  Neurological:     General: No focal deficit present.     Mental Status: She is alert.  Psychiatric:        Mood and Affect: Mood normal.        Behavior: Behavior normal.         Assessment And Plan:     1. Hypertensive nephropathy  Chronic, uncontrolled. She has yet to take her  meds today. She admits that she is also agitated with her daughter who is not present. I will check labs as listed below.   - CMP14+EGFR - CBC - Lipid panel  2. Type 2 diabetes mellitus with stage 2 chronic kidney disease, without long-term current use of insulin (HCC)  I will check an hba1c today. She has been stable off of meds.   - Hemoglobin A1c  3. Empty sella ([place])  Chronic, she has been stable.   4. Thrombocytopenia, unspecified (HCC)  Chronic. I will check CBC today.   5. Right foot pain  She is advised to apply OTC Voltaren gel to affected area two to three times daily as needed. If her sx persist, I will refer her to Podiatry for further evaluation.   6. Class 1 obesity due to excess calories with serious comorbidity and body mass index (BMI) of 32.0 to 32.9 in adult  She is encouraged to perform chair exercises four to five days per week.   Maximino Greenland, MD     THE PATIENT IS ENCOURAGED TO PRACTICE SOCIAL DISTANCING DUE TO THE COVID-19 PANDEMIC.

## 2019-02-18 ENCOUNTER — Telehealth: Payer: Self-pay

## 2019-02-18 NOTE — Telephone Encounter (Signed)
I left the pt a message to call the office back.  I was calling because Dr. Baird Cancer wants to know if the pt is still having right foot pain and if so does she want to see a specialist. I was also going to remind the pt that she has seen Dr Rowland [facility] office in the past.

## 2019-03-04 ENCOUNTER — Ambulatory Visit: Payer: Medicare Other | Attending: Internal Medicine

## 2019-03-04 DIAGNOSIS — Z20822 Contact with and (suspected) exposure to covid-19: Secondary | ICD-10-CM

## 2019-03-06 LAB — NOVEL CORONAVIRUS, NAA: SARS-CoV-2, NAA: NOT DETECTED

## 2019-03-11 ENCOUNTER — Other Ambulatory Visit: Payer: Self-pay | Admitting: Internal Medicine

## 2019-03-11 ENCOUNTER — Ambulatory Visit
Admission: RE | Admit: 2019-03-11 | Discharge: 2019-03-11 | Disposition: A | Discharge status: Home / Self Care | Payer: Medicare Other | Source: Ambulatory Visit | Attending: Internal Medicine | Admitting: Internal Medicine

## 2019-03-11 DIAGNOSIS — R079 Chest pain, unspecified: Secondary | ICD-10-CM

## 2019-03-11 DIAGNOSIS — R059 Cough, unspecified: Secondary | ICD-10-CM

## 2019-03-11 DIAGNOSIS — R05 Cough: Secondary | ICD-10-CM | POA: Diagnosis not present

## 2019-04-01 ENCOUNTER — Telehealth: Payer: Self-pay

## 2019-04-01 NOTE — Telephone Encounter (Signed)
I returned the pt's call and notified her of her chest x-ray results.  Dr. Baird Cancer said that it showed acute disease.

## 2019-04-16 ENCOUNTER — Telehealth: Payer: Self-pay

## 2019-04-16 NOTE — Telephone Encounter (Signed)
The pt called and wanted to ask if the office has a Dr. Melony Wiggins because she has an appt on the 12th  The pt was told no, that in her chart it says that she is seeing Dr. Lucia Wiggins about her ear.

## 2019-04-17 DIAGNOSIS — B351 Tinea unguium: Secondary | ICD-10-CM | POA: Diagnosis not present

## 2019-04-17 DIAGNOSIS — E1151 Type 2 diabetes mellitus with diabetic peripheral angiopathy without gangrene: Secondary | ICD-10-CM | POA: Diagnosis not present

## 2019-04-19 ENCOUNTER — Encounter (INDEPENDENT_AMBULATORY_CARE_PROVIDER_SITE_OTHER): Payer: Self-pay | Admitting: Otolaryngology

## 2019-04-19 ENCOUNTER — Other Ambulatory Visit: Payer: Self-pay

## 2019-04-19 ENCOUNTER — Ambulatory Visit (INDEPENDENT_AMBULATORY_CARE_PROVIDER_SITE_OTHER): Payer: Medicare Other | Admitting: Otolaryngology

## 2019-04-19 VITALS — Temp 97.2°F

## 2019-04-19 DIAGNOSIS — H6123 Impacted cerumen, bilateral: Secondary | ICD-10-CM

## 2019-04-19 NOTE — Progress Notes (Signed)
HPI: Leah Wiggins is a 86 y.o. female who presents for evaluation of complaints of itching in her ears especially when she wears her hearing aids.  She got her hearing aids from [facility].. She complains of occasional discomfort or pain in the ears.  She presently does not have her hearing aids.  Past Medical History:  Diagnosis Date  . Arthritis    "shoulders" (05/04/2017)  . Breast cancer, right breast ([place]) 1991  . GERD (gastroesophageal reflux disease)   . Hyperlipidemia   . Hypertension   . Non-stress test nonreactive, 04/24/12, normal 12/10/2012  . OSA on CPAP    "don't wear it all the time" (05/04/2017)  . PVD (peripheral vascular disease) ([place])   . Tobacco use 12/10/2012  . Type II diabetes mellitus ([place])    Past Surgical History:  Procedure Laterality Date  . APPENDECTOMY    . BIOPSY  01/19/2018   Procedure: BIOPSY;  Surgeon: Carol Ada, MD;  Location: [facility];  Service: Endoscopy;;  . BREAST BIOPSY Right 1991  . CARDIOVASCULAR STRESS TEST  04/24/2012   No significant ST segment change suggestive of ischemia.  Marland Kitchen CATARACT EXTRACTION, BILATERAL Bilateral   . COLONOSCOPY WITH PROPOFOL N/A 01/19/2018   Procedure: COLONOSCOPY WITH PROPOFOL;  Surgeon: Carol Ada, MD;  Location: [facility];  Service: Endoscopy;  Laterality: N/A;  . ESOPHAGOGASTRODUODENOSCOPY (EGD) WITH PROPOFOL N/A 01/19/2018   Procedure: ESOPHAGOGASTRODUODENOSCOPY (EGD) WITH PROPOFOL;  Surgeon: Carol Ada, MD;  Location: [facility];  Service: Endoscopy;  Laterality: N/A;  . JOINT REPLACEMENT    . LOWER EXTREMITY ARTERIAL DOPPLER Bilateral 05/07/2012   Left ABI-demonstrated mild arterial insufficiency. Right CIA 50-69% diameter reduction. Right SFA less than 50% diameter reduction. Left SFA 70-99% diameter reduction. Bilateral Runoff-Posterior tibial arteries appeared occluded.  Marland Kitchen MASTECTOMY PARTIAL / LUMPECTOMY W/ AXILLARY LYMPHADENECTOMY Right 1991   . TOTAL HIP ARTHROPLASTY Right 2011    Social History   Socioeconomic History  . Marital status: Widowed    Spouse name: Not on file  . Number of children: Not on file  . Years of education: Not on file  . Highest education level: Not on file  Occupational History  . Occupation: retired  Tobacco Use  . Smoking status: Current Some Day Smoker    Packs/day: 0.25    Years: 70.00    Pack years: 17.50    Types: Cigarettes    Start date: 03/08/1947  . Smokeless tobacco: Never Used  . Tobacco comment: has tried cold [place]  Substance and Sexual Activity  . Alcohol use: No    Alcohol/week: 0.0 standard drinks  . Drug use: No  . Sexual activity: Not Currently  Other Topics Concern  . Not on file  Social History Narrative  . Not on file   Social Determinants of Health   Financial Resource Strain:   . Difficulty of Paying Living Expenses: Not on file  Food Insecurity:   . Worried About Charity fundraiser in the Last Year: Not on file  . Ran Out of Food in the Last Year: Not on file  Transportation Needs:   . Lack of Transportation (Medical): Not on file  . Lack of Transportation (Non-Medical): Not on file  Physical Activity:   . Days of Exercise per Week: Not on file  . Minutes of Exercise per Session: Not on file  Stress:   . Feeling of Stress : Not on file  Social Connections:   . Frequency of Communication with Friends and Family: Not  on file  . Frequency of Social Gatherings with Friends and Family: Not on file  . Attends Religious Services: Not on file  . Active Member of Clubs or Organizations: Not on file  . Attends Archivist Meetings: Not on file  . Marital Status: Not on file   Family History  Problem Relation Age of Onset  . Heart disease Mother   . Stroke Mother   . Stroke Father   . Cancer Sister        GIST  . Cancer Brother        thyroid   . Diabetes Son   . Stroke Son    Allergies  Allergen Reactions  . Bactrim [Sulfamethoxazole-Trimethoprim] Swelling    Marked  angioedema requiring hospitalization  . Penicillins Other (See Comments)    Has patient had a PCN reaction causing immediate rash, facial/tongue/throat swelling, SOB or lightheadedness with hypotension: Unk Has patient had a PCN reaction causing severe rash involving mucus membranes or skin necrosis: Unk Has patient had a PCN reaction that required hospitalization: Unk Has patient had a PCN reaction occurring within the last 10 years: No If all of the above answers are "NO", then may proceed with Cephalosporin use.   Prior to Admission medications   Medication Sig Start Date End Date Taking? Authorizing Provider  alendronate (FOSAMAX) 70 MG tablet TAKE 1 TABLET EVERY WEDNESDAY WITH A FULL GLASS OF WATER ON AN EMPTY STOMACH 01/03/19  Yes Glendale Chard, MD  aspirin EC 81 MG tablet Take 81 mg by mouth daily.   Yes [provider]  benzonatate (TESSALON PERLES) 100 MG capsule Take 1 capsule (100 mg total) by mouth 2 (two) times daily as needed for cough. 10/31/18 10/31/19 Yes Glendale Chard, MD  Blood Glucose Monitoring Suppl [facility] VERIO) w/Device KIT Use as directed to check blood sugars 1 time per day dx: e11.65 05/11/18  Yes Glendale Chard, MD  clotrimazole-betamethasone (LOTRISONE) cream Apply to affected area 2 times daily prn 10/31/15  Yes Mabe, Shanon Brow, NP  EPINEPHrine 0.3 mg/0.3 mL IJ SOAJ injection Inject 0.3 mLs (0.3 mg total) into the muscle once as needed (prn worsening angioedema). 05/05/17  Yes Nita Sells, MD  gabapentin (NEURONTIN) 100 MG capsule Take 1 capsule (100 mg total) by mouth at bedtime. 10/31/18 10/31/19 Yes Glendale Chard, MD  glucose blood (ONETOUCH VERIO) test strip Use as instructed to check blood sugars 1 time per day dx: e11.65 05/11/18  Yes Glendale Chard, MD  HYDROcodone-acetaminophen (NORCO/VICODIN) 5-325 MG tablet Take 1 tablet by mouth every 4 (four) hours as needed. 05/05/18  Yes Caryl Ada K, PA-C  metFORMIN (GLUCOPHAGE-XR) 500 MG 24 hr tablet TAKE 1  TABLET TWICE A DAY 07/24/18  Yes Glendale Chard, MD  MICARDIS 40 MG tablet TAKE 1 TABLET DAILY 01/03/19  Yes Glendale Chard, MD  mometasone (ELOCON) 0.1 % lotion Apply 1 application topically daily as needed (IRRITATION).  04/15/14  Yes [provider]  mupirocin ointment (BACTROBAN) 2 % 1 application as needed.  04/11/17  Yes [provider]  OneTouch Delica Lancets 32G MISC Use as directed to check blood sugars 1 time per day dx: e11.65 05/11/18  Yes Glendale Chard, MD  pantoprazole (PROTONIX) 40 MG tablet TAKE 1 TABLET DAILY AT 12 NOON 01/03/19  Yes Glendale Chard, MD  pravastatin (PRAVACHOL) 40 MG tablet TAKE 1 TABLET DAILY 07/24/18  Yes Glendale Chard, MD  tiotropium (SPIRIVA) 18 MCG inhalation capsule Place 18 mcg into inhaler and inhale daily.  11/05/11  Yes [provider]  ferrous sulfate 325 (65 FE) MG EC tablet Take 1 tablet (325 mg total) by mouth 3 (three) times daily with meals. 12/25/17 06/18/18  Henreitta Leber, MD     Positive ROS: Otherwise negative  All other systems have been reviewed and were otherwise negative with the exception of those mentioned in the HPI and as above.  Physical Exam: Constitutional: Alert, well-appearing, no acute distress Ears: External ears without lesions or tenderness. Ear canals very mild eczema of the external skin.  Large amount of wax in both ear canals that was cleaned in the office using forceps and suction.  TMs are clear bilaterally.. Nasal: External nose without lesions. Clear nasal passages Oral: Oropharynx clear. Neck: No palpable adenopathy or masses Respiratory: Breathing comfortably  Skin: No facial/neck lesions or rash noted.  Cerumen impaction removal  Date/Time: 04/19/2019 3:14 PM Performed by: Rozetta Nunnery, MD Authorized by: Rozetta Nunnery, MD   Consent:    Consent obtained:  Verbal   Consent given by:  Patient   Risks discussed:  Pain and bleeding Procedure details:    Location:  L  ear and R ear   Procedure type: suction and forceps   Post-procedure details:    Inspection:  TM intact and canal normal   Hearing quality:  Improved   Patient tolerance of procedure:  Tolerated well, no immediate complications Comments:     TMs are clear bilaterally.  Ear canals are clear bilaterally.    Assessment: Cerumen buildup bilaterally. Bilateral SNHL  Plan: Ear canals were cleaned in the office. I prescribed Diprolene 0.05% cream or lotion to use in the external ear with a Q-tip twice daily x3 days as needed itching.  Radene Journey, MD

## 2019-05-20 ENCOUNTER — Other Ambulatory Visit: Payer: Self-pay

## 2019-05-20 ENCOUNTER — Encounter: Payer: Self-pay | Admitting: Internal Medicine

## 2019-05-20 ENCOUNTER — Ambulatory Visit (INDEPENDENT_AMBULATORY_CARE_PROVIDER_SITE_OTHER): Payer: Medicare Other | Admitting: Internal Medicine

## 2019-05-20 VITALS — BP 138/80 | HR 82 | Temp 98.3°F | Ht 59.5 in | Wt 164.6 lb

## 2019-05-20 DIAGNOSIS — E1122 Type 2 diabetes mellitus with diabetic chronic kidney disease: Secondary | ICD-10-CM

## 2019-05-20 DIAGNOSIS — J410 Simple chronic bronchitis: Secondary | ICD-10-CM

## 2019-05-20 DIAGNOSIS — Z6832 Body mass index (BMI) 32.0-32.9, adult: Secondary | ICD-10-CM

## 2019-05-20 DIAGNOSIS — R413 Other amnesia: Secondary | ICD-10-CM

## 2019-05-20 DIAGNOSIS — E6609 Other obesity due to excess calories: Secondary | ICD-10-CM

## 2019-05-20 DIAGNOSIS — R2689 Other abnormalities of gait and mobility: Secondary | ICD-10-CM

## 2019-05-20 DIAGNOSIS — N182 Chronic kidney disease, stage 2 (mild): Secondary | ICD-10-CM

## 2019-05-20 DIAGNOSIS — I129 Hypertensive chronic kidney disease with stage 1 through stage 4 chronic kidney disease, or unspecified chronic kidney disease: Secondary | ICD-10-CM

## 2019-05-20 DIAGNOSIS — I739 Peripheral vascular disease, unspecified: Secondary | ICD-10-CM

## 2019-05-20 MED ORDER — MICARDIS 40 MG PO TABS: 40.0000 mg | ORAL_TABLET | Freq: Every day | ORAL | 3 refills | Status: DC

## 2019-05-20 MED ORDER — PRAVASTATIN SODIUM 40 MG PO TABS: 40.0000 mg | ORAL_TABLET | Freq: Every day | ORAL | 3 refills | Status: DC

## 2019-05-20 NOTE — Progress Notes (Signed)
This visit occurred during the SARS-CoV-2 public health emergency.  Safety protocols were in place, including screening questions prior to the visit, additional usage of staff PPE, and extensive cleaning of exam room while observing appropriate contact time as indicated for disinfecting solutions.  Subjective:     Patient ID: Leah Wiggins , female    DOB: [birth year] , 86 y.o.   MRN: [id number]   Chief Complaint  Patient presents with  . Diabetes    complained of headaches ear pain (seen by ent)  daughter wants to know if syphilis causes cognitive issues   . Hypertension    HPI  HPI   Past Medical History:  Diagnosis Date  . Arthritis    "shoulders" (05/04/2017)  . Breast cancer, right breast ([place]) 1991  . GERD (gastroesophageal reflux disease)   . Hyperlipidemia   . Hypertension   . Non-stress test nonreactive, 04/24/12, normal 12/10/2012  . OSA on CPAP    "don't wear it all the time" (05/04/2017)  . PVD (peripheral vascular disease) ([place])   . Tobacco use 12/10/2012  . Type II diabetes mellitus (HCC)      Family History  Problem Relation Age of Onset  . Heart disease Mother   . Stroke Mother   . Stroke Father   . Cancer Sister        GIST  . Cancer Brother        thyroid   . Diabetes Son   . Stroke Son      Current Outpatient Medications:  .  alendronate (FOSAMAX) 70 MG tablet, TAKE 1 TABLET EVERY WEDNESDAY WITH A FULL GLASS OF WATER ON AN EMPTY STOMACH, Disp: 12 tablet, Rfl: 3 .  aspirin EC 81 MG tablet, Take 81 mg by mouth daily., Disp: , Rfl:  .  benzonatate (TESSALON PERLES) 100 MG capsule, Take 1 capsule (100 mg total) by mouth 2 (two) times daily as needed for cough., Disp: 30 capsule, Rfl: 1 .  Blood Glucose Monitoring Suppl (ONETOUCH VERIO) w/Device KIT, Use as directed to check blood sugars 1 time per day dx: e11.65, Disp: 1 kit, Rfl: 1 .  clotrimazole-betamethasone (LOTRISONE) cream, Apply to affected area 2 times daily prn, Disp: 30 g, Rfl: 0 .   EPINEPHrine 0.3 mg/0.3 mL IJ SOAJ injection, Inject 0.3 mLs (0.3 mg total) into the muscle once as needed (prn worsening angioedema)., Disp: 1 Device, Rfl: 0 .  ferrous sulfate 325 (65 FE) MG EC tablet, Take 1 tablet (325 mg total) by mouth 3 (three) times daily with meals., Disp: 90 tablet, Rfl: 2 .  gabapentin (NEURONTIN) 100 MG capsule, Take 1 capsule (100 mg total) by mouth at bedtime., Disp: 90 capsule, Rfl: 1 .  glucose blood (ONETOUCH VERIO) test strip, Use as instructed to check blood sugars 1 time per day dx: e11.65, Disp: 50 each, Rfl: 11 .  HYDROcodone-acetaminophen (NORCO/VICODIN) 5-325 MG tablet, Take 1 tablet by mouth every 4 (four) hours as needed., Disp: 10 tablet, Rfl: 0 .  metFORMIN (GLUCOPHAGE-XR) 500 MG 24 hr tablet, TAKE 1 TABLET TWICE A DAY, Disp: 180 tablet, Rfl: 3 .  MICARDIS 40 MG tablet, Take 1 tablet (40 mg total) by mouth daily., Disp: 90 tablet, Rfl: 3 .  mometasone (ELOCON) 0.1 % lotion, Apply 1 application topically daily as needed (IRRITATION). , Disp: , Rfl: 0 .  mupirocin ointment (BACTROBAN) 2 %, 1 application as needed. , Disp: , Rfl: 0 .  OneTouch Delica Lancets 75I MISC, Use as directed to  check blood sugars 1 time per day dx: e11.65, Disp: 50 each, Rfl: 11 .  pantoprazole (PROTONIX) 40 MG tablet, TAKE 1 TABLET DAILY AT 12 NOON, Disp: 90 tablet, Rfl: 3 .  pravastatin (PRAVACHOL) 40 MG tablet, Take 1 tablet (40 mg total) by mouth daily., Disp: 90 tablet, Rfl: 3 .  tiotropium (SPIRIVA) 18 MCG inhalation capsule, Place 18 mcg into inhaler and inhale daily. , Disp: , Rfl:    Allergies  Allergen Reactions  . Bactrim [Sulfamethoxazole-Trimethoprim] Swelling    Marked angioedema requiring hospitalization  . Penicillins Other (See Comments)    Has patient had a PCN reaction causing immediate rash, facial/tongue/throat swelling, SOB or lightheadedness with hypotension: Unk Has patient had a PCN reaction causing severe rash involving mucus membranes or skin necrosis:  Unk Has patient had a PCN reaction that required hospitalization: Unk Has patient had a PCN reaction occurring within the last 10 years: No If all of the above answers are "NO", then may proceed with Cephalosporin use.     Review of Systems  Constitutional: Negative.   Respiratory: Negative.   Cardiovascular: Negative.   Gastrointestinal: Negative.   Neurological: Negative.        Daughter expresses concern regarding pt's memory. She has noticed that this is more of an issue recently. There have been no cooking accidents in the home.   Pt c/o imbalance with walking. Thinks it is her ears. Denies ear pain/tinnitus. Has h/o hearing deficit. Admits she does not always wear hearing aids.   Psychiatric/Behavioral: Negative.      Today's Vitals   05/20/19 1109  BP: 138/80  Pulse: 82  Temp: 98.3 F (36.8 C)  Weight: 164 lb 9.6 oz (74.7 kg)  Height: 4' 11.5" (1.511 m)   Body mass index is 32.69 kg/m.   Objective:  Physical Exam Vitals and nursing note reviewed.  Constitutional:      Appearance: Normal appearance.  HENT:     Head: Normocephalic and atraumatic.     Right Ear: Tympanic membrane, ear canal and external ear normal. There is no impacted cerumen.     Left Ear: Tympanic membrane, ear canal and external ear normal. There is no impacted cerumen.  Cardiovascular:     Rate and Rhythm: Normal rate and regular rhythm.     Heart sounds: Normal heart sounds.  Pulmonary:     Effort: Pulmonary effort is normal.     Comments: Decreased breath sounds at bases Skin:    General: Skin is warm.  Neurological:     General: No focal deficit present.     Mental Status: She is alert.  Psychiatric:        Mood and Affect: Mood normal.        Behavior: Behavior normal.         Assessment And Plan:     1. Type 2 diabetes mellitus with stage 2 chronic kidney disease, without long-term current use of insulin (HCC)  Chronic. Both patient and dgtr agree to [facility] referral. Previous  labs reviewed in full detail. I will check labs at her next visit.  I will make med changes as needed at that time.   - Referral to Chronic Care Management Services  2. Hypertensive nephropathy  Chronic, fair control. Pt is aware that optimal BP is less than 130/80. She is encouraged to avoid adding salt to her foods.   - Referral to Chronic Care Management Services  3. Memory loss  I will check labs as listed below.  I will also refer her to Neuro for further evaluation.   - TSH - Vitamin B12 - Ambulatory referral to Neurology - Referral to Chronic Care Management Services  4. Smokers' cough (HCC)  Chronic. She does not wish to quit at this time; however, she does agree to cut back on number of cigs smoked per day.   5. PAD (peripheral artery disease) (HCC)  Chronic. Importance of regular exercise and statin compliance was discussed with the patient.   6. Imbalance  She and daughter agree to [facility] evaluation with PT. She is encouraged to perform exercises on her "off' days as well. EAR exam performed, no cerumen impaction noted. Advised to wear at least one hearing aid daily.   - Ambulatory referral to Home Health  7. Class 1 obesity due to excess calories with serious comorbidity and body mass index (BMI) of 32.0 to 32.9 in adult  She is encouraged to strive for BMI less than 30 to decrease cardiac risk. She is encouraged to avoid sugary beverages and processed foods.   Maximino Greenland, MD    THE PATIENT IS ENCOURAGED TO PRACTICE SOCIAL DISTANCING DUE TO THE COVID-19 PANDEMIC.

## 2019-05-20 NOTE — Patient Instructions (Signed)
Peripheral Vascular Disease  Peripheral vascular disease (PVD) is a disease of the blood vessels that are not part of your heart and brain. A simple term for PVD is poor circulation. In most cases, PVD narrows the blood vessels that carry blood from your heart to the rest of your body. This can reduce the supply of blood to your arms, legs, and internal organs, like your stomach or kidneys. However, PVD most often affects a person's lower legs and feet. Without treatment, PVD tends to get worse. PVD can also lead to acute ischemic limb. This is when an arm or leg suddenly cannot get enough blood. This is a medical emergency. Follow these instructions at home: Lifestyle  Do not use any products that contain nicotine or tobacco, such as cigarettes and e-cigarettes. If you need help quitting, ask your doctor.  Lose weight if you are overweight. Or, stay at a healthy weight as told by your doctor.  Eat a diet that is low in fat and cholesterol. If you need help, ask your doctor.  Exercise regularly. Ask your doctor for activities that are right for you. General instructions  Take over-the-counter and prescription medicines only as told by your doctor.  Take good care of your feet: ? Wear comfortable shoes that fit well. ? Check your feet often for any cuts or sores.  Keep all follow-up visits as told by your doctor This is important. Contact a doctor if:  You have cramps in your legs when you walk.  You have leg pain when you are at rest.  You have coldness in a leg or foot.  Your skin changes.  You are unable to get or have an erection (erectile dysfunction).  You have cuts or sores on your feet that do not heal. Get help right away if:  Your arm or leg turns cold, numb, and blue.  Your arms or legs become red, warm, swollen, painful, or numb.  You have chest pain.  You have trouble breathing.  You suddenly have weakness in your face, arm, or leg.  You become very  confused or you cannot speak.  You suddenly have a very bad headache.  You suddenly cannot see. Summary  Peripheral vascular disease (PVD) is a disease of the blood vessels.  A simple term for PVD is poor circulation. Without treatment, PVD tends to get worse.  Treatment may include exercise, low fat and low cholesterol diet, and quitting smoking. This information is not intended to replace advice given to you by your health care provider. Make sure you discuss any questions you have with your health care provider. Document Revised: 02/03/2017 Document Reviewed: 03/31/2016 Elsevier Patient Education  2020 Elsevier Inc.  

## 2019-05-21 LAB — TSH: TSH: 1.81 u[IU]/mL (ref 0.450–4.500)

## 2019-05-21 LAB — VITAMIN B12: Vitamin B-12: 904 pg/mL (ref 232–1245)

## 2019-05-22 ENCOUNTER — Ambulatory Visit: Payer: Self-pay

## 2019-05-22 DIAGNOSIS — I739 Peripheral vascular disease, unspecified: Secondary | ICD-10-CM

## 2019-05-22 DIAGNOSIS — E1122 Type 2 diabetes mellitus with diabetic chronic kidney disease: Secondary | ICD-10-CM

## 2019-05-22 NOTE — Chronic Care Management (AMB) (Signed)
  Chronic Care Management   Outreach Note  05/22/2019 Name: Leah Wiggins MRN: [id number] DOB: 1926-03-26  Referred by: Glendale Chard, MD Reason for referral : Care Coordination   An unsuccessful telephone outreach to the patients daughter, Leah Wiggins, was attempted today. The patient was referred to the case management team for assistance with care management and care coordination.   Follow Up Plan: A HIPPA compliant phone message was left for Leah Wiggins providing contact information and requesting a return call.  The care management team will reach out to [facility] again over the next 10 days.   Daneen Schick, BSW, CDP Social Worker, Certified Dementia Practitioner [place] [facility] [place] [facility] [phone]

## 2019-05-28 ENCOUNTER — Ambulatory Visit: Payer: Self-pay

## 2019-05-28 DIAGNOSIS — I739 Peripheral vascular disease, unspecified: Secondary | ICD-10-CM

## 2019-05-28 DIAGNOSIS — N182 Chronic kidney disease, stage 2 (mild): Secondary | ICD-10-CM

## 2019-05-28 DIAGNOSIS — E1122 Type 2 diabetes mellitus with diabetic chronic kidney disease: Secondary | ICD-10-CM

## 2019-05-28 NOTE — Chronic Care Management (AMB) (Signed)
  Chronic Care Management   Outreach Note  05/28/2019 Name: [name] MRN: [id number] DOB: December 28, 1926  Referred by: Glendale Chard, MD Reason for referral : Care Coordination   A second unsuccessful telephone outreach was attempted today. SW attempted to contact the patients daughter, [name] as indicated in provider referral. SW left a HIPAA compliant voice message requesting a return call. The patient was referred to the case management team for assistance with care management and care coordination.   Follow Up Plan: The care management team will reach out to the patient again over the next 14 days.   Daneen Schick, BSW, CDP Social Worker, Certified Dementia Practitioner [place] [facility] [place] [facility] [phone]

## 2019-05-29 ENCOUNTER — Telehealth: Payer: Self-pay

## 2019-05-29 NOTE — Telephone Encounter (Signed)
I returned the pt's call to notify her that she didn't have an appt with Dr. Baird Cancer for the 29th that she needed to cancel.

## 2019-05-29 NOTE — Telephone Encounter (Signed)
I spoke with Leah Wiggins the pt's daughter and she said that her mother was going to keep her neurology referral and the physical therapy referral but that she didn't want to see a Education officer, museum.  Ms. Leah Wiggins was told that the social worker can help with resources that maybe needed for the pt. Ms. Leah Wiggins said that she will call the social worker back for the pt.

## 2019-06-03 ENCOUNTER — Ambulatory Visit: Payer: Self-pay

## 2019-06-03 DIAGNOSIS — E1122 Type 2 diabetes mellitus with diabetic chronic kidney disease: Secondary | ICD-10-CM

## 2019-06-03 DIAGNOSIS — H6093 Unspecified otitis externa, bilateral: Secondary | ICD-10-CM | POA: Diagnosis not present

## 2019-06-03 DIAGNOSIS — N182 Chronic kidney disease, stage 2 (mild): Secondary | ICD-10-CM

## 2019-06-03 DIAGNOSIS — I739 Peripheral vascular disease, unspecified: Secondary | ICD-10-CM

## 2019-06-03 DIAGNOSIS — H608X3 Other otitis externa, bilateral: Secondary | ICD-10-CM | POA: Diagnosis not present

## 2019-06-03 DIAGNOSIS — L309 Dermatitis, unspecified: Secondary | ICD-10-CM | POA: Diagnosis not present

## 2019-06-03 DIAGNOSIS — R413 Other amnesia: Secondary | ICD-10-CM

## 2019-06-03 DIAGNOSIS — H90A32 Mixed conductive and sensorineural hearing loss, unilateral, left ear with restricted hearing on the contralateral side: Secondary | ICD-10-CM | POA: Diagnosis not present

## 2019-06-03 DIAGNOSIS — H90A21 Sensorineural hearing loss, unilateral, right ear, with restricted hearing on the contralateral side: Secondary | ICD-10-CM | POA: Diagnosis not present

## 2019-06-03 NOTE — Chronic Care Management (AMB) (Signed)
  Chronic Care Management   Outreach Note  06/03/2019 Name: Leah Wiggins MRN: [id number] DOB: Jan 18, 1927  Referred by: Glendale Chard, MD Reason for referral : Care Coordination   SW planned third outreach attempt to the patients daughter today to discuss enrollment into care management program. Noted phone encounter from 05/29/2019 where daughter indicated she is not interested in working with SW at this time.   Follow Up Plan: No further follow up required: SW will close referral due to declination of services.  Daneen Schick, BSW, CDP Social Worker, Certified Dementia Practitioner [place] [facility] [place] [facility] [phone]

## 2019-06-24 ENCOUNTER — Ambulatory Visit (INDEPENDENT_AMBULATORY_CARE_PROVIDER_SITE_OTHER): Payer: Medicare Other | Admitting: Neurology

## 2019-06-24 ENCOUNTER — Telehealth: Payer: Self-pay | Admitting: Neurology

## 2019-06-24 ENCOUNTER — Other Ambulatory Visit: Payer: Self-pay

## 2019-06-24 ENCOUNTER — Encounter: Payer: Self-pay | Admitting: Neurology

## 2019-06-24 VITALS — BP 150/70 | HR 75 | Temp 97.1°F | Ht 59.5 in | Wt 166.0 lb

## 2019-06-24 DIAGNOSIS — R413 Other amnesia: Secondary | ICD-10-CM | POA: Diagnosis not present

## 2019-06-24 NOTE — Progress Notes (Signed)
PATIENT: Leah Wiggins DOB: [birth year]  Chief Complaint  Patient presents with  . Memory Loss    MMSE 23/30 - 5 animals. She is here with her daughter, [name], to have her declining memory evaluated.   Marland Kitchen PCP    Glendale Chard, MD     HISTORICAL  Leah Wiggins is a 86 years old female, seen in request by her primary care physician Dr. Baird Cancer, Bailey Mech for evaluation of memory loss, she is accompanied by her daughter [name] at today's visit on June 24, 2019.  I have reviewed and summarized the referring note from the referring physician.  She had past medical history of diabetes, hyperlipidemia, hypertension, right breast cancer, status post lobectomy.  She is a mother of 8 children, worked at maintenance in the past, currently lives with her son, she denies family history of memory loss, has hard of hearing.  Around 2019, she was noted to have gradual onset memory loss, tends to misplace things, also began to make mistake in her medications, it is difficult for her to keep up with the schedule of medications, her daughter has placed the medicine pillbox, but she still tends to " mess with it", her daughter also took over her bill payment made it autno draft online, she forgets to pay her bill.  She is quite independent in her daily activity, can dress, feed, bathe without assistant, denies bowel and bladder incontinence, complains of shoulder pain due to previous fall, right shoulder injury  Laboratory evaluations in 2021, normal B12 905 TSH, A1c was mildly elevated 5.7, lipid panel LDL of 69   REVIEW OF SYSTEMS: Full 14 system review of systems performed and notable only for as above All other review of systems were negative.  ALLERGIES: Allergies  Allergen Reactions  . Bactrim [Sulfamethoxazole-Trimethoprim] Swelling    Marked angioedema requiring hospitalization  . Penicillins Other (See Comments)    Has patient had a PCN reaction causing immediate rash,  facial/tongue/throat swelling, SOB or lightheadedness with hypotension: Unk Has patient had a PCN reaction causing severe rash involving mucus membranes or skin necrosis: Unk Has patient had a PCN reaction that required hospitalization: Unk Has patient had a PCN reaction occurring within the last 10 years: No If all of the above answers are "NO", then may proceed with Cephalosporin use.    HOME MEDICATIONS: Current Outpatient Medications  Medication Sig Dispense Refill  . alendronate (FOSAMAX) 70 MG tablet TAKE 1 TABLET EVERY WEDNESDAY WITH A FULL GLASS OF WATER ON AN EMPTY STOMACH 12 tablet 3  . aspirin EC 81 MG tablet Take 81 mg by mouth daily.    . benzonatate (TESSALON PERLES) 100 MG capsule Take 1 capsule (100 mg total) by mouth 2 (two) times daily as needed for cough. 30 capsule 1  . Blood Glucose Monitoring Suppl (ONETOUCH VERIO) w/Device KIT Use as directed to check blood sugars 1 time per day dx: e11.65 1 kit 1  . clotrimazole-betamethasone (LOTRISONE) cream Apply to affected area 2 times daily prn 30 g 0  . EPINEPHrine 0.3 mg/0.3 mL IJ SOAJ injection Inject 0.3 mLs (0.3 mg total) into the muscle once as needed (prn worsening angioedema). 1 Device 0  . glucose blood (ONETOUCH VERIO) test strip Use as instructed to check blood sugars 1 time per day dx: e11.65 50 each 11  . HYDROcodone-acetaminophen (NORCO/VICODIN) 5-325 MG tablet Take 1 tablet by mouth every 4 (four) hours as needed. 10 tablet 0  . metFORMIN (GLUCOPHAGE-XR) 500 MG 24  hr tablet TAKE 1 TABLET TWICE A DAY 180 tablet 3  . MICARDIS 40 MG tablet Take 1 tablet (40 mg total) by mouth daily. 90 tablet 3  . mometasone (ELOCON) 0.1 % lotion Apply 1 application topically daily as needed (IRRITATION).   0  . mupirocin ointment (BACTROBAN) 2 % 1 application as needed.   0  . OneTouch Delica Lancets 59F MISC Use as directed to check blood sugars 1 time per day dx: e11.65 50 each 11  . pantoprazole (PROTONIX) 40 MG tablet TAKE 1  TABLET DAILY AT 12 NOON 90 tablet 3  . pravastatin (PRAVACHOL) 40 MG tablet Take 1 tablet (40 mg total) by mouth daily. 90 tablet 3  . tiotropium (SPIRIVA) 18 MCG inhalation capsule Place 18 mcg into inhaler and inhale daily.     . ferrous sulfate 325 (65 FE) MG EC tablet Take 1 tablet (325 mg total) by mouth 3 (three) times daily with meals. 90 tablet 2   No current facility-administered medications for this visit.    PAST MEDICAL HISTORY: Past Medical History:  Diagnosis Date  . Arthritis    "shoulders" (05/04/2017)  . Breast cancer, right breast ([place]) 1991  . GERD (gastroesophageal reflux disease)   . Hard of hearing   . Hyperlipidemia   . Hypertension   . Memory loss   . Non-stress test nonreactive, 04/24/12, normal 12/10/2012  . OSA on CPAP    "don't wear it all the time" (05/04/2017)  . PVD (peripheral vascular disease) ([place])   . Tobacco use 12/10/2012  . Type II diabetes mellitus ([place])     PAST SURGICAL HISTORY: Past Surgical History:  Procedure Laterality Date  . APPENDECTOMY    . BIOPSY  01/19/2018   Procedure: BIOPSY;  Surgeon: Carol Ada, MD;  Location: [facility];  Service: Endoscopy;;  . BREAST BIOPSY Right 1991  . CARDIOVASCULAR STRESS TEST  04/24/2012   No significant ST segment change suggestive of ischemia.  Marland Kitchen CATARACT EXTRACTION, BILATERAL Bilateral   . COLONOSCOPY WITH PROPOFOL N/A 01/19/2018   Procedure: COLONOSCOPY WITH PROPOFOL;  Surgeon: Carol Ada, MD;  Location: [facility];  Service: Endoscopy;  Laterality: N/A;  . ESOPHAGOGASTRODUODENOSCOPY (EGD) WITH PROPOFOL N/A 01/19/2018   Procedure: ESOPHAGOGASTRODUODENOSCOPY (EGD) WITH PROPOFOL;  Surgeon: Carol Ada, MD;  Location: [facility];  Service: Endoscopy;  Laterality: N/A;  . JOINT REPLACEMENT    . LOWER EXTREMITY ARTERIAL DOPPLER Bilateral 05/07/2012   Left ABI-demonstrated mild arterial insufficiency. Right CIA 50-69% diameter reduction. Right SFA less than 50% diameter reduction. Left SFA  70-99% diameter reduction. Bilateral Runoff-Posterior tibial arteries appeared occluded.  Marland Kitchen MASTECTOMY PARTIAL / LUMPECTOMY W/ AXILLARY LYMPHADENECTOMY Right 1991   . TOTAL HIP ARTHROPLASTY Right 2011    FAMILY HISTORY: Family History  Problem Relation Age of Onset  . Heart disease Mother   . Stroke Mother   . Stroke Father   . Cancer Sister        GIST  . Cancer Brother        thyroid   . Diabetes Son   . Stroke Son     SOCIAL HISTORY: Social History   Socioeconomic History  . Marital status: Widowed    Spouse name: Not on file  . Number of children: 8  . Years of education: 8th grade  . Highest education level: Not on file  Occupational History  . Occupation: retired  Tobacco Use  . Smoking status: Current Some Day Smoker    Packs/day: 0.25  Years: 70.00    Pack years: 17.50    Types: Cigarettes    Start date: 03/08/1947  . Smokeless tobacco: Never Used  . Tobacco comment: has tried cold [place]  Substance and Sexual Activity  . Alcohol use: No    Alcohol/week: 0.0 standard drinks  . Drug use: No  . Sexual activity: Not Currently  Other Topics Concern  . Not on file  Social History Narrative   Lives at home with her son.   She had eight children (two sets of twins).   No daily use of caffeine.   Right-handed.   Social Determinants of Health   Financial Resource Strain:   . Difficulty of Paying Living Expenses:   Food Insecurity:   . Worried About Charity fundraiser in the Last Year:   . Arboriculturist in the Last Year:   Transportation Needs:   . Film/video editor (Medical):   Marland Kitchen Lack of Transportation (Non-Medical):   Physical Activity:   . Days of Exercise per Week:   . Minutes of Exercise per Session:   Stress:   . Feeling of Stress :   Social Connections:   . Frequency of Communication with Friends and Family:   . Frequency of Social Gatherings with Friends and Family:   . Attends Religious Services:   . Active Member of Clubs or  Organizations:   . Attends Archivist Meetings:   Marland Kitchen Marital Status:   Intimate Partner Violence:   . Fear of Current or Ex-Partner:   . Emotionally Abused:   Marland Kitchen Physically Abused:   . Sexually Abused:      PHYSICAL EXAM   Vitals:   06/24/19 0820  BP: (!) 150/70  Pulse: 75  Temp: (!) 97.1 F (36.2 C)  Weight: 166 lb (75.3 kg)  Height: 4' 11.5" (1.511 m)    Not recorded      Body mass index is 32.97 kg/m.  PHYSICAL EXAMNIATION:  Gen: NAD, conversant, well nourised, well groomed                     Cardiovascular: Regular rate rhythm, no peripheral edema, warm, nontender. Eyes: Conjunctivae clear without exudates or hemorrhage Neck: Supple, no carotid bruits. Pulmonary: Clear to auscultation bilaterally   NEUROLOGICAL EXAM:  MMSE - Mini Mental State Exam 06/24/2019  Orientation to time 5  Orientation to Place 5  Registration 3  Attention/ Calculation 2  Recall 0  Language- name 2 objects 2  Language- repeat 1  Language- follow 3 step command 3  Language- read & follow direction 1  Write a sentence 1  Copy design 0  Total score 23  Animal naming 5   CRANIAL NERVES: CN II: Visual fields are full to confrontation. Pupils are round equal and briskly reactive to light. CN III, IV, VI: extraocular movement are normal. No ptosis. CN V: Facial sensation is intact to light touch CN VII: Face is symmetric with normal eye closure  CN VIII: Hearing is normal to causal conversation. CN IX, X: Phonation is normal. CN XI: Head turning and shoulder shrug are intact  MOTOR: Limited upper extremity proximal muscle strength examination due to shoulder pain, there was no significant weakness of bilateral upper or lower extremity strength  REFLEXES: Reflexes are 1 and symmetric at the biceps, triceps, knees, and ankles. Plantar responses are flexor.  SENSORY: Intact to light touch, pinprick and vibratory sensation are intact in fingers and  toes.  COORDINATION:  There is no trunk or limb dysmetria noted.  GAIT/STANCE: She needs push-up to get up from seated position, fairly steady, kyphosis  DIAGNOSTIC DATA (LABS, IMAGING, TESTING) - I reviewed patient records, labs, notes, testing and imaging myself where available.   ASSESSMENT AND PLAN  TRACYANN DUFFELL is a 86 y.o. female   Mild cognitive impairment  Mini-Mental Status Examination 23 out of 30  Laboratory evaluation showed no treatable etiology  Most likely central nervous system degenerative disorder, may have a vascular component  Proceed with MRI of the brain  Return to clinic in 6 months   Marcial Pacas, M.D. Ph.D.  [facility] [address] [place], [place] [address] Ph: [phone] Fax: [phone]  CC: Glendale Chard, MD

## 2019-06-24 NOTE — Telephone Encounter (Signed)
[facility] medicare/tricare order sent to GI. No auth they will reach out to the patient to schedule.

## 2019-06-25 DIAGNOSIS — M25512 Pain in left shoulder: Secondary | ICD-10-CM | POA: Diagnosis not present

## 2019-06-26 ENCOUNTER — Other Ambulatory Visit: Payer: Self-pay | Admitting: Internal Medicine

## 2019-07-22 ENCOUNTER — Ambulatory Visit
Admission: RE | Admit: 2019-07-22 | Discharge: 2019-07-22 | Disposition: A | Discharge status: Home / Self Care | Payer: Medicare Other | Source: Ambulatory Visit | Attending: Neurology | Admitting: Neurology

## 2019-07-22 ENCOUNTER — Other Ambulatory Visit: Payer: Self-pay

## 2019-07-22 DIAGNOSIS — R413 Other amnesia: Secondary | ICD-10-CM | POA: Diagnosis not present

## 2019-08-16 ENCOUNTER — Telehealth: Payer: Self-pay

## 2019-08-16 NOTE — Telephone Encounter (Signed)
The pt's daughter [name] was notified that the pt's daughter Cheryl's [employer] form is completed but the top part on the 2nd page needed to be completed by Malachy Mood. A copy has been kept for the pt's chart.

## 2019-08-19 ENCOUNTER — Ambulatory Visit (INDEPENDENT_AMBULATORY_CARE_PROVIDER_SITE_OTHER): Payer: Medicare Other | Admitting: Internal Medicine

## 2019-08-19 ENCOUNTER — Other Ambulatory Visit: Payer: Self-pay

## 2019-08-19 ENCOUNTER — Encounter: Payer: Self-pay | Admitting: Internal Medicine

## 2019-08-19 VITALS — BP 110/74 | HR 80 | Temp 98.8°F | Ht 59.5 in | Wt 166.2 lb

## 2019-08-19 DIAGNOSIS — Z6833 Body mass index (BMI) 33.0-33.9, adult: Secondary | ICD-10-CM

## 2019-08-19 DIAGNOSIS — E236 Other disorders of pituitary gland: Secondary | ICD-10-CM | POA: Diagnosis not present

## 2019-08-19 DIAGNOSIS — E6609 Other obesity due to excess calories: Secondary | ICD-10-CM

## 2019-08-19 DIAGNOSIS — N182 Chronic kidney disease, stage 2 (mild): Secondary | ICD-10-CM

## 2019-08-19 DIAGNOSIS — K219 Gastro-esophageal reflux disease without esophagitis: Secondary | ICD-10-CM

## 2019-08-19 DIAGNOSIS — E1122 Type 2 diabetes mellitus with diabetic chronic kidney disease: Secondary | ICD-10-CM | POA: Diagnosis not present

## 2019-08-19 DIAGNOSIS — R3915 Urgency of urination: Secondary | ICD-10-CM

## 2019-08-19 DIAGNOSIS — I129 Hypertensive chronic kidney disease with stage 1 through stage 4 chronic kidney disease, or unspecified chronic kidney disease: Secondary | ICD-10-CM | POA: Diagnosis not present

## 2019-08-19 DIAGNOSIS — J449 Chronic obstructive pulmonary disease, unspecified: Secondary | ICD-10-CM

## 2019-08-19 DIAGNOSIS — D696 Thrombocytopenia, unspecified: Secondary | ICD-10-CM

## 2019-08-19 DIAGNOSIS — R0789 Other chest pain: Secondary | ICD-10-CM

## 2019-08-19 LAB — POCT URINALYSIS DIPSTICK
Bilirubin, UA: NEGATIVE
Glucose, UA: NEGATIVE
Ketones, UA: NEGATIVE
Nitrite, UA: NEGATIVE
Protein, UA: NEGATIVE
Spec Grav, UA: 1.02 (ref 1.010–1.025)
Urobilinogen, UA: 0.2 E.U./dL
pH, UA: 5.5 (ref 5.0–8.0)

## 2019-08-19 MED ORDER — ONETOUCH VERIO W/DEVICE KIT: PACK | 1 refills | Status: DC

## 2019-08-19 MED ORDER — AEROCHAMBER PLUS MISC: 2 refills | Status: AC

## 2019-08-19 MED ORDER — TIOTROPIUM BROMIDE MONOHYDRATE 18 MCG IN CAPS: 18.0000 ug | ORAL_CAPSULE | Freq: Every day | RESPIRATORY_TRACT | 2 refills | Status: DC

## 2019-08-19 NOTE — Patient Instructions (Signed)

## 2019-08-19 NOTE — Progress Notes (Signed)
This visit occurred during the SARS-CoV-2 public health emergency.  Safety protocols were in place, including screening questions prior to the visit, additional usage of staff PPE, and extensive cleaning of exam room while observing appropriate contact time as indicated for disinfecting solutions.  Subjective:     Patient ID: Leah Wiggins , female    DOB: 12-Dec-1926 , 86 y.o.   MRN: [id number]   Chief Complaint  Patient presents with  . Diabetes  . Hypertension  . [place]    HPI  She presents today for DM/HTN check. She is accompanied by her daughter.   Diabetes She presents for her follow-up diabetic visit. She has type 2 diabetes mellitus. There are no hypoglycemic associated symptoms. Associated symptoms include chest pain. Pertinent negatives for diabetes include no blurred vision. There are no hypoglycemic complications. Risk factors for coronary artery disease include diabetes mellitus, dyslipidemia, hypertension, obesity, sedentary lifestyle and post-menopausal.  Hypertension This is a chronic problem. The current episode started more than 1 year ago. The problem has been gradually improving since onset. The problem is controlled. Associated symptoms include chest pain. Pertinent negatives include no blurred vision. The current treatment provides moderate improvement.     Past Medical History:  Diagnosis Date  . Arthritis    "shoulders" (05/04/2017)  . Breast cancer, right breast ([place]) 1991  . GERD (gastroesophageal reflux disease)   . Hard of hearing   . Hyperlipidemia   . Hypertension   . Memory loss   . Non-stress test nonreactive, 04/24/12, normal 12/10/2012  . OSA on CPAP    "don't wear it all the time" (05/04/2017)  . PVD (peripheral vascular disease) ([place])   . Tobacco use 12/10/2012  . Type II diabetes mellitus (HCC)      Family History  Problem Relation Age of Onset  . Heart disease Mother   . Stroke Mother   . Stroke Father   . Cancer Sister         GIST  . Cancer Brother        thyroid   . Diabetes Son   . Stroke Son      Current Outpatient Medications:  .  alendronate (FOSAMAX) 70 MG tablet, TAKE 1 TABLET EVERY WEDNESDAY WITH A FULL GLASS OF WATER ON AN EMPTY STOMACH, Disp: 12 tablet, Rfl: 3 .  aspirin EC 81 MG tablet, Take 81 mg by mouth daily., Disp: , Rfl:  .  Blood Glucose Monitoring Suppl (ONETOUCH VERIO) w/Device KIT, Use as directed to check blood sugars 1 time per day dx: e11.65, Disp: 1 kit, Rfl: 1 .  clotrimazole-betamethasone (LOTRISONE) cream, Apply to affected area 2 times daily prn, Disp: 30 g, Rfl: 0 .  glucose blood (ONETOUCH VERIO) test strip, Use as instructed to check blood sugars 1 time per day dx: e11.65, Disp: 50 each, Rfl: 11 .  metFORMIN (GLUCOPHAGE-XR) 500 MG 24 hr tablet, TAKE 1 TABLET TWICE A DAY, Disp: 180 tablet, Rfl: 1 .  MICARDIS 40 MG tablet, Take 1 tablet (40 mg total) by mouth daily., Disp: 90 tablet, Rfl: 3 .  mometasone (ELOCON) 0.1 % lotion, Apply 1 application topically daily as needed (IRRITATION). , Disp: , Rfl: 0 .  mupirocin ointment (BACTROBAN) 2 %, 1 application as needed. , Disp: , Rfl: 0 .  OneTouch Delica Lancets 50T MISC, Use as directed to check blood sugars 1 time per day dx: e11.65, Disp: 50 each, Rfl: 11 .  pantoprazole (PROTONIX) 40 MG tablet, TAKE 1 TABLET DAILY AT  12 NOON, Disp: 90 tablet, Rfl: 3 .  pravastatin (PRAVACHOL) 40 MG tablet, Take 1 tablet (40 mg total) by mouth daily., Disp: 90 tablet, Rfl: 3 .  tiotropium (SPIRIVA) 18 MCG inhalation capsule, Place 18 mcg into inhaler and inhale daily. , Disp: , Rfl:  .  benzonatate (TESSALON PERLES) 100 MG capsule, Take 1 capsule (100 mg total) by mouth 2 (two) times daily as needed for cough. (Patient not taking: Reported on 08/19/2019), Disp: 30 capsule, Rfl: 1 .  EPINEPHrine 0.3 mg/0.3 mL IJ SOAJ injection, Inject 0.3 mLs (0.3 mg total) into the muscle once as needed (prn worsening angioedema). (Patient not taking: Reported on  08/19/2019), Disp: 1 Device, Rfl: 0 .  ferrous sulfate 325 (65 FE) MG EC tablet, Take 1 tablet (325 mg total) by mouth 3 (three) times daily with meals., Disp: 90 tablet, Rfl: 2 .  Spacer/Aero-Holding Chambers (AEROCHAMBER PLUS) inhaler, Use as instructed, Disp: 1 each, Rfl: 2   Allergies  Allergen Reactions  . Bactrim [Sulfamethoxazole-Trimethoprim] Swelling    Marked angioedema requiring hospitalization  . Penicillins Other (See Comments)    Has patient had a PCN reaction causing immediate rash, facial/tongue/throat swelling, SOB or lightheadedness with hypotension: Unk Has patient had a PCN reaction causing severe rash involving mucus membranes or skin necrosis: Unk Has patient had a PCN reaction that required hospitalization: Unk Has patient had a PCN reaction occurring within the last 10 years: No If all of the above answers are "NO", then may proceed with Cephalosporin use.     Review of Systems  Constitutional: Negative.   Eyes: Negative for blurred vision.  Respiratory: Negative.   Cardiovascular: Positive for chest pain.       She c/o intermittent chest pain - sharp/stabbing, in the middle of her chest. No associated palpitations or sob. She admits to increased flatulence and abdominal bloating.   Gastrointestinal: Negative.   Neurological: Negative.   Psychiatric/Behavioral: Negative.      Today's Vitals   08/19/19 1558  BP: 110/74  Pulse: 80  Temp: 98.8 F (37.1 C)  TempSrc: Oral  Weight: 166 lb 3.2 oz (75.4 kg)  Height: 4' 11.5" (1.511 m)  PainSc: 10-Worst pain ever  PainLoc: Chest   Body mass index is 33.01 kg/m.   Objective:  Physical Exam Vitals and nursing note reviewed.  Constitutional:      Appearance: Normal appearance. She is obese.  HENT:     Head: Normocephalic and atraumatic.  Cardiovascular:     Rate and Rhythm: Normal rate and regular rhythm.     Heart sounds: Normal heart sounds.  Pulmonary:     Effort: Pulmonary effort is normal.      Breath sounds: No stridor. No rhonchi.     Comments: Decreased breath sounds b/l Abdominal:     General: Bowel sounds are normal. There is distension.     Palpations: Abdomen is soft.     Tenderness: There is abdominal tenderness.     Comments: Epigastric tenderness.  Skin:    General: Skin is warm.  Neurological:     General: No focal deficit present.     Mental Status: She is alert.  Psychiatric:        Mood and Affect: Mood normal.        Behavior: Behavior normal.         Assessment And Plan:   1. Type 2 diabetes mellitus with stage 2 chronic kidney disease, without long-term current use of insulin (HCC)  Chronic, I  will check labs when she returns for her nurse visit in two weeks. I will check CMP, hba1c at that time. She will continue with metformin for now. Advised to increase her water intake.   2. Hypertensive nephropathy  Chronic, well controlled. She will continue with current meds. She is encouraged to avoid adding salt to her foods.   3. Chronic obstructive pulmonary disease, unspecified COPD type (HCC)  Chronic, yet stable. She was given refills of Spiriva as requested. Advised to use daily as directed. A 90-day rx was sent to her local pharmacy. Rx for spacer was also sent to the pharmacy. She is advised to use this as needed with her rescue inhaler.   4. Empty sella (HCC)  Chronic, yet stable.   5. Thrombocytopenia, unspecified ([place])  I will check CBC w /diff at her next visit. She denies having any abnormal bleeding at this time.   6. Other chest pain  Her sx are atypical; however, due to her cardiac risk factors, EKG was performed. This was NSR w/o acute changes. I think her sx are GI-related.   - EKG 12-Lead  7. Class 1 obesity due to excess calories with serious comorbidity and body mass index (BMI) of 33.0 to 33.9 in adult  She is encouraged to walk to her mailbox twice daily. Also encouraged to march in place during TV shows. Advised to lose 10  pounds to decrease cardiac risk.   8. Gastroesophageal reflux disease without esophagitis  Chronic. She was given samples of Dexilant to take once daily INSTEAD of pantoprazole. She will follow this regimen for four weeks.   9. Urinary urgency  U/a w/ leukocytes. I will check culture.   - Urine Culture - POCT Urinalysis Dipstick (81002)   Maximino Greenland, MD    THE PATIENT IS ENCOURAGED TO PRACTICE SOCIAL DISTANCING DUE TO THE COVID-19 PANDEMIC.

## 2019-08-20 LAB — URINE CULTURE: Organism ID, Bacteria: NO GROWTH

## 2019-08-22 ENCOUNTER — Telehealth: Payer: Self-pay

## 2019-08-22 NOTE — Telephone Encounter (Signed)
Left vm for pt to return call for labs

## 2019-08-22 NOTE — Telephone Encounter (Signed)
-----   Message from Glendale Chard, MD sent at 08/21/2019 11:06 PM EDT ----- Urine culture is negative.

## 2019-09-04 DIAGNOSIS — L299 Pruritus, unspecified: Secondary | ICD-10-CM | POA: Diagnosis not present

## 2019-09-04 DIAGNOSIS — L2084 Intrinsic (allergic) eczema: Secondary | ICD-10-CM | POA: Diagnosis not present

## 2019-09-04 DIAGNOSIS — Z5181 Encounter for therapeutic drug level monitoring: Secondary | ICD-10-CM | POA: Diagnosis not present

## 2019-09-04 DIAGNOSIS — Z79899 Other long term (current) drug therapy: Secondary | ICD-10-CM | POA: Diagnosis not present

## 2019-09-04 DIAGNOSIS — L853 Xerosis cutis: Secondary | ICD-10-CM | POA: Diagnosis not present

## 2019-09-16 DIAGNOSIS — H524 Presbyopia: Secondary | ICD-10-CM | POA: Diagnosis not present

## 2019-09-16 DIAGNOSIS — E119 Type 2 diabetes mellitus without complications: Secondary | ICD-10-CM | POA: Diagnosis not present

## 2019-09-16 DIAGNOSIS — H52203 Unspecified astigmatism, bilateral: Secondary | ICD-10-CM | POA: Diagnosis not present

## 2019-09-16 DIAGNOSIS — Z961 Presence of intraocular lens: Secondary | ICD-10-CM | POA: Diagnosis not present

## 2019-09-16 LAB — HM DIABETES EYE EXAM

## 2019-09-17 ENCOUNTER — Other Ambulatory Visit: Payer: Self-pay

## 2019-09-17 ENCOUNTER — Ambulatory Visit: Payer: Medicare Other

## 2019-09-17 ENCOUNTER — Telehealth: Payer: Self-pay

## 2019-09-17 ENCOUNTER — Other Ambulatory Visit: Payer: Medicare Other

## 2019-09-17 ENCOUNTER — Encounter: Payer: Self-pay | Admitting: Internal Medicine

## 2019-09-17 DIAGNOSIS — N182 Chronic kidney disease, stage 2 (mild): Secondary | ICD-10-CM

## 2019-09-17 DIAGNOSIS — D696 Thrombocytopenia, unspecified: Secondary | ICD-10-CM

## 2019-09-17 DIAGNOSIS — E1122 Type 2 diabetes mellitus with diabetic chronic kidney disease: Secondary | ICD-10-CM | POA: Diagnosis not present

## 2019-09-17 DIAGNOSIS — Z79899 Other long term (current) drug therapy: Secondary | ICD-10-CM | POA: Diagnosis not present

## 2019-09-17 MED ORDER — ONETOUCH VERIO VI STRP: ORAL_STRIP | 2 refills | Status: DC

## 2019-09-17 NOTE — Telephone Encounter (Signed)
The pt's daughter [name] said that the pt didn't need to be shown how to use her inhaler spacer because the pharmacy showed her how to use it.

## 2019-09-18 ENCOUNTER — Other Ambulatory Visit: Payer: Self-pay

## 2019-09-18 LAB — CBC WITH DIFFERENTIAL/PLATELET
Basophils Absolute: 0 10*3/uL (ref 0.0–0.2)
Basos: 1 %
EOS (ABSOLUTE): 0.1 10*3/uL (ref 0.0–0.4)
Eos: 1 %
Hematocrit: 35.8 % (ref 34.0–46.6)
Hemoglobin: 11.2 g/dL (ref 11.1–15.9)
Immature Grans (Abs): 0 10*3/uL (ref 0.0–0.1)
Immature Granulocytes: 0 %
Lymphocytes Absolute: 1.3 10*3/uL (ref 0.7–3.1)
Lymphs: 22 %
MCH: 24.8 pg — ABNORMAL LOW (ref 26.6–33.0)
MCHC: 31.3 g/dL — ABNORMAL LOW (ref 31.5–35.7)
MCV: 79 fL (ref 79–97)
Monocytes Absolute: 0.5 10*3/uL (ref 0.1–0.9)
Monocytes: 8 %
Neutrophils Absolute: 3.9 10*3/uL (ref 1.4–7.0)
Neutrophils: 68 %
RBC: 4.51 x10E6/uL (ref 3.77–5.28)
RDW: 16.6 % — ABNORMAL HIGH (ref 11.7–15.4)
WBC: 5.8 10*3/uL (ref 3.4–10.8)

## 2019-09-18 LAB — CMP14+EGFR
ALT: 17 IU/L (ref 0–32)
AST: 22 IU/L (ref 0–40)
Albumin/Globulin Ratio: 1.5 (ref 1.2–2.2)
Albumin: 3.9 g/dL (ref 3.5–4.6)
Alkaline Phosphatase: 86 IU/L (ref 48–121)
BUN/Creatinine Ratio: 17 (ref 12–28)
BUN: 15 mg/dL (ref 10–36)
Bilirubin Total: <0.2 mg/dL (ref 0.0–1.2)
CO2: 23 mmol/L (ref 20–29)
Calcium: 9.2 mg/dL (ref 8.7–10.3)
Chloride: 111 mmol/L — ABNORMAL HIGH (ref 96–106)
Creatinine, Ser: 0.89 mg/dL (ref 0.57–1.00)
GFR calc Af Amer: 66 mL/min/{1.73_m2} (ref >59)
GFR calc non Af Amer: 57 mL/min/{1.73_m2} — ABNORMAL LOW (ref >59)
Globulin, Total: 2.6 g/dL (ref 1.5–4.5)
Glucose: 97 mg/dL (ref 65–99)
Potassium: 4.3 mmol/L (ref 3.5–5.2)
Sodium: 147 mmol/L — ABNORMAL HIGH (ref 134–144)
Total Protein: 6.5 g/dL (ref 6.0–8.5)

## 2019-09-18 LAB — HEMOGLOBIN A1C
Est. average glucose Bld gHb Est-mCnc: 128 mg/dL
Hgb A1c MFr Bld: 6.1 % — ABNORMAL HIGH (ref 4.8–5.6)

## 2019-09-18 MED ORDER — FREESTYLE LITE TEST VI STRP: ORAL_STRIP | 12 refills | Status: DC

## 2019-09-18 MED ORDER — FREESTYLE FREEDOM LITE W/DEVICE KIT: PACK | 3 refills | Status: AC

## 2019-09-19 ENCOUNTER — Telehealth: Payer: Self-pay

## 2019-09-19 DIAGNOSIS — H903 Sensorineural hearing loss, bilateral: Secondary | ICD-10-CM | POA: Diagnosis not present

## 2019-09-19 NOTE — Telephone Encounter (Signed)
I left the pt's daughter [name] a message that her [employer] forms has been completed and is ready for pickup and that I don't see a fax number on the forms to fax them to.

## 2019-09-26 ENCOUNTER — Encounter: Payer: Self-pay | Admitting: Internal Medicine

## 2019-11-20 ENCOUNTER — Ambulatory Visit: Payer: Medicare Other | Admitting: Internal Medicine

## 2019-11-21 ENCOUNTER — Other Ambulatory Visit: Payer: Self-pay

## 2019-11-21 ENCOUNTER — Encounter: Payer: Self-pay | Admitting: Internal Medicine

## 2019-11-21 ENCOUNTER — Ambulatory Visit (INDEPENDENT_AMBULATORY_CARE_PROVIDER_SITE_OTHER): Payer: Medicare Other | Admitting: Internal Medicine

## 2019-11-21 VITALS — BP 130/72 | HR 81 | Temp 98.0°F | Ht 60.0 in | Wt 164.8 lb

## 2019-11-21 DIAGNOSIS — J449 Chronic obstructive pulmonary disease, unspecified: Secondary | ICD-10-CM | POA: Diagnosis not present

## 2019-11-21 DIAGNOSIS — Z23 Encounter for immunization: Secondary | ICD-10-CM

## 2019-11-21 DIAGNOSIS — Z6832 Body mass index (BMI) 32.0-32.9, adult: Secondary | ICD-10-CM

## 2019-11-21 DIAGNOSIS — E1122 Type 2 diabetes mellitus with diabetic chronic kidney disease: Secondary | ICD-10-CM

## 2019-11-21 DIAGNOSIS — I129 Hypertensive chronic kidney disease with stage 1 through stage 4 chronic kidney disease, or unspecified chronic kidney disease: Secondary | ICD-10-CM | POA: Diagnosis not present

## 2019-11-21 DIAGNOSIS — E6609 Other obesity due to excess calories: Secondary | ICD-10-CM

## 2019-11-21 DIAGNOSIS — I739 Peripheral vascular disease, unspecified: Secondary | ICD-10-CM | POA: Diagnosis not present

## 2019-11-21 DIAGNOSIS — N182 Chronic kidney disease, stage 2 (mild): Secondary | ICD-10-CM

## 2019-11-21 MED ORDER — GABAPENTIN 100 MG PO CAPS
ORAL_CAPSULE | ORAL | 2 refills | Status: DC
Start: 2019-11-21 — End: 2020-12-28

## 2019-11-21 MED ORDER — ONETOUCH VERIO VI STRP: ORAL_STRIP | 2 refills | Status: DC

## 2019-11-21 MED ORDER — OMEPRAZOLE 20 MG PO CPDR: 20.0000 mg | DELAYED_RELEASE_CAPSULE | Freq: Every day | ORAL | 0 refills | Status: DC

## 2019-11-21 NOTE — Progress Notes (Signed)
I,Tianna Badgett,acting as a Education administrator for Maximino Greenland, MD.,have documented all relevant documentation on the behalf of Maximino Greenland, MD,as directed by  Maximino Greenland, MD while in the presence of Maximino Greenland, MD.  This visit occurred during the SARS-CoV-2 public health emergency.  Safety protocols were in place, including screening questions prior to the visit, additional usage of staff PPE, and extensive cleaning of exam room while observing appropriate contact time as indicated for disinfecting solutions.  Subjective:     Patient ID: Leah Wiggins , female    DOB: [birth year] , 86 y.o.   MRN: [id number]   Chief Complaint  Patient presents with  . Diabetes    FOOT EXAM   . Hypertension    HPI  She presents today for DM/HTN check. She is accompanied by her daughter. She reports compliance with meds. Admits she is still smoking.   Diabetes She presents for her follow-up diabetic visit. She has type 2 diabetes mellitus. There are no hypoglycemic associated symptoms. Pertinent negatives for diabetes include no blurred vision. There are no hypoglycemic complications. Risk factors for coronary artery disease include diabetes mellitus, dyslipidemia, hypertension, obesity, sedentary lifestyle and post-menopausal.  Hypertension This is a chronic problem. The current episode started more than 1 year ago. The problem has been gradually improving since onset. The problem is controlled. Pertinent negatives include no blurred vision. The current treatment provides moderate improvement.     Past Medical History:  Diagnosis Date  . Arthritis    "shoulders" (05/04/2017)  . Breast cancer, right breast ([place]) 1991  . GERD (gastroesophageal reflux disease)   . Hard of hearing   . Hyperlipidemia   . Hypertension   . Memory loss   . Non-stress test nonreactive, 04/24/12, normal 12/10/2012  . OSA on CPAP    "don't wear it all the time" (05/04/2017)  . PVD (peripheral vascular disease) ([place])    . Tobacco use 12/10/2012  . Type II diabetes mellitus (HCC)      Family History  Problem Relation Age of Onset  . Heart disease Mother   . Stroke Mother   . Stroke Father   . Cancer Sister        GIST  . Cancer Brother        thyroid   . Diabetes Son   . Stroke Son      Current Outpatient Medications:  .  alendronate (FOSAMAX) 70 MG tablet, TAKE 1 TABLET EVERY WEDNESDAY WITH A FULL GLASS OF WATER ON AN EMPTY STOMACH, Disp: 12 tablet, Rfl: 3 .  aspirin EC 81 MG tablet, Take 81 mg by mouth daily., Disp: , Rfl:  .  Blood Glucose Monitoring Suppl (FREESTYLE FREEDOM LITE) w/Device KIT, Use to check blood sugar 3 times a day. Dx code e11.65, Disp: 1 kit, Rfl: 3 .  clotrimazole-betamethasone (LOTRISONE) cream, Apply to affected area 2 times daily prn, Disp: 30 g, Rfl: 0 .  EPINEPHrine 0.3 mg/0.3 mL IJ SOAJ injection, Inject 0.3 mLs (0.3 mg total) into the muscle once as needed (prn worsening angioedema)., Disp: 1 Device, Rfl: 0 .  ferrous sulfate 325 (65 FE) MG EC tablet, Take 1 tablet (325 mg total) by mouth 3 (three) times daily with meals., Disp: 90 tablet, Rfl: 2 .  metFORMIN (GLUCOPHAGE-XR) 500 MG 24 hr tablet, TAKE 1 TABLET TWICE A DAY, Disp: 180 tablet, Rfl: 1 .  MICARDIS 40 MG tablet, Take 1 tablet (40 mg total) by mouth daily., Disp: 90 tablet, Rfl:  3 .  mometasone (ELOCON) 0.1 % lotion, Apply 1 application topically daily as needed (IRRITATION). , Disp: , Rfl: 0 .  mupirocin ointment (BACTROBAN) 2 %, 1 application as needed. , Disp: , Rfl: 0 .  OneTouch Delica Lancets 36U MISC, Use as directed to check blood sugars 1 time per day dx: e11.65, Disp: 50 each, Rfl: 11 .  pravastatin (PRAVACHOL) 40 MG tablet, Take 1 tablet (40 mg total) by mouth daily., Disp: 90 tablet, Rfl: 3 .  Spacer/Aero-Holding Chambers (AEROCHAMBER PLUS) inhaler, Use as instructed, Disp: 1 each, Rfl: 2 .  tiotropium (SPIRIVA) 18 MCG inhalation capsule, Place 1 capsule (18 mcg total) into inhaler and inhale  daily., Disp: 90 capsule, Rfl: 2 .  gabapentin (NEURONTIN) 100 MG capsule, One capsule po nightly, Disp: 90 capsule, Rfl: 2 .  glucose blood (ONETOUCH VERIO) test strip, Use as instructed to check blood sugar 3 times a day. Dx code e11.65, Disp: 300 each, Rfl: 2 .  omeprazole (PRILOSEC) 20 MG capsule, Take 1 capsule (20 mg total) by mouth daily., Disp: 90 capsule, Rfl: 0   Allergies  Allergen Reactions  . Bactrim [Sulfamethoxazole-Trimethoprim] Swelling    Marked angioedema requiring hospitalization  . Penicillins Other (See Comments)    Has patient had a PCN reaction causing immediate rash, facial/tongue/throat swelling, SOB or lightheadedness with hypotension: Unk Has patient had a PCN reaction causing severe rash involving mucus membranes or skin necrosis: Unk Has patient had a PCN reaction that required hospitalization: Unk Has patient had a PCN reaction occurring within the last 10 years: No If all of the above answers are "NO", then may proceed with Cephalosporin use.     Review of Systems  Constitutional: Negative.   Eyes: Negative for blurred vision.  Respiratory: Positive for cough.        States her "breathing" isn't right. Inhaler not helping  Cardiovascular: Negative.   Gastrointestinal: Negative.   Neurological: Negative.      Today's Vitals   11/21/19 1427  BP: 130/72  Pulse: 81  Temp: 98 F (36.7 C)  TempSrc: Oral  Weight: 164 lb 12.8 oz (74.8 kg)  Height: 5' (1.524 m)   Body mass index is 32.19 kg/m.   Objective:  Physical Exam Vitals and nursing note reviewed.  Constitutional:      Appearance: Normal appearance. She is obese.  HENT:     Head: Normocephalic and atraumatic.  Cardiovascular:     Rate and Rhythm: Normal rate and regular rhythm.     Pulses:          Dorsalis pedis pulses are 1+ on the right side and 1+ on the left side.     Heart sounds: Normal heart sounds.  Pulmonary:     Effort: Pulmonary effort is normal.     Breath sounds:  Rhonchi present.     Comments: Decreased breath sounds at bases Feet:     Right foot:     Protective Sensation: 5 sites tested. 5 sites sensed.     Skin integrity: Callus and dry skin present.     Toenail Condition: Right toenails are abnormally thick.     Left foot:     Protective Sensation: 5 sites tested. 5 sites sensed.     Skin integrity: Callus and dry skin present.     Toenail Condition: Left toenails are abnormally thick.  Skin:    General: Skin is warm.  Neurological:     General: No focal deficit present.  Mental Status: She is alert.  Psychiatric:        Mood and Affect: Mood normal.        Behavior: Behavior normal.         Assessment And Plan:     1. Type 2 diabetes mellitus with stage 2 chronic kidney disease, without long-term current use of insulin (HCC) Comments: Chronic, I will check labs as listed below. importance of dietary compliance was discussed with the patient. Last a1c 6.1 in July 2021.   2. Hypertensive nephropathy Comments: Chronic, controlled. She will continue with current meds. She is encouraged to avoid adding salt to her foods.   3. Chronic obstructive pulmonary disease, unspecified COPD type ([place]) Comments: Chronic, given sample of Brextri to use 2 puffs twice daily. She has been unable to use spacer in the past. She will f/u in six weeks. Advised to call if she has any issues with the medication.   4. Peripheral artery disease ([place]) Comments: Chronic, importance of smoking cessation was discussed with the patient. Advised to walk, avoid fried foods, take chol meds and live heart healthy lifestyle.   5. Class 1 obesity due to excess calories with serious comorbidity and body mass index (BMI) of 32.0 to 32.9 in adult Comments: She is encouraged to strive for BMI less than 30 to decrease cardiac risk. Advised to increase daily activity to increase exercise.   6. Immunization due Comments: She declined flu vaccine.   Patient was given  opportunity to ask questions. Patient verbalized understanding of the plan and was able to repeat key elements of the plan. All questions were answered to their satisfaction.  Maximino Greenland, MD   I, Maximino Greenland, MD, have reviewed all documentation for this visit. The documentation on 11/24/19 for the exam, diagnosis, procedures, and orders are all accurate and complete.  THE PATIENT IS ENCOURAGED TO PRACTICE SOCIAL DISTANCING DUE TO THE COVID-19 PANDEMIC.

## 2019-11-21 NOTE — Patient Instructions (Addendum)
How to Use a Metered Dose Inhaler A metered dose inhaler is a handheld device for taking medicine that must be breathed into the lungs (inhaled). The device can be used to deliver a variety of inhaled medicines, including:  Quick relief or rescue medicines, such as bronchodilators.  Controller medicines, such as corticosteroids. The medicine is delivered by pushing down on a metal canister to release a preset amount of spray and medicine. Each device contains the amount of medicine that is needed for a preset number of uses (inhalations). Your health care provider may recommend that you use a spacer with your inhaler to help you take the medicine more effectively. A spacer is a plastic tube with a mouthpiece on one end and an opening that connects to the inhaler on the other end. A spacer holds the medicine in a tube for a short time, which allows you to inhale more medicine. What are the risks? If you do not use your inhaler correctly, medicine might not reach your lungs to help you breathe. Inhaler medicine can cause side effects, such as:  Mouth or throat infection.  Cough.  Hoarseness.  Headache.  Nausea and vomiting.  Lung infection (pneumonia) in people who have a lung condition called COPD. How to use a metered dose inhaler without a spacer  1. Remove the cap from the inhaler. 2. If you are using the inhaler for the first time, shake it for 5 seconds, turn it away from your face, then release 4 puffs into the air. This is called priming. 3. Shake the inhaler for 5 seconds. 4. Position the inhaler so the top of the canister faces up. 5. Put your index finger on the top of the medicine canister. Support the bottom of the inhaler with your thumb. 6. Breathe out normally and as completely as possible, away from the inhaler. 7. Either place the inhaler between your teeth and close your lips tightly around the mouthpiece, or hold the inhaler 1-2 inches (2.5-5 cm) away from your open  mouth. Keep your tongue down out of the way. If you are unsure which technique to use, ask your health care provider. 8. Press the canister down with your index finger to release the medicine, then inhale deeply and slowly through your mouth (not your nose) until your lungs are completely filled. Inhaling should take 4-6 seconds. 9. Hold the medicine in your lungs for 5-10 seconds (10 seconds is best). This helps the medicine get into the small airways of your lungs. 10. With your lips in a tight circle (pursed), breathe out slowly. 11. Repeat steps 3-10 until you have taken the number of puffs that your health care provider directed. Wait about 1 minute between puffs or as directed. 12. Put the cap on the inhaler. 13. If you are using a steroid inhaler, rinse your mouth with water, gargle, and spit out the water. Do not swallow the water. How to use a metered dose inhaler with a spacer  1. Remove the cap from the inhaler. 2. If you are using the inhaler for the first time, shake it for 5 seconds, turn it away from your face, then release 4 puffs into the air. This is called priming. 3. Shake the inhaler for 5 seconds. 4. Place the open end of the spacer onto the inhaler mouthpiece. 5. Position the inhaler so the top of the canister faces up and the spacer mouthpiece faces you. 6. Put your index finger on the top of the medicine canister.  Support the bottom of the inhaler and the spacer with your thumb. 7. Breathe out normally and as completely as possible, away from the spacer. 8. Place the spacer between your teeth and close your lips tightly around it. Keep your tongue down out of the way. 9. Press the canister down with your index finger to release the medicine, then inhale deeply and slowly through your mouth (not your nose) until your lungs are completely filled. Inhaling should take 4-6 seconds. 10. Hold the medicine in your lungs for 5-10 seconds (10 seconds is best). This helps the  medicine get into the small airways of your lungs. 11. With your lips in a tight circle (pursed), breathe out slowly. 12. Repeat steps 3-11 until you have taken the number of puffs that your health care provider directed. Wait about 1 minute between puffs or as directed. 13. Remove the spacer from the inhaler and put the cap on the inhaler. 14. If you are using a steroid inhaler, rinse your mouth with water, gargle, and spit out the water. Do not swallow the water. Follow these instructions at home:  Take your inhaled medicine only as told by your health care provider. Do not use the inhaler more than directed by your health care provider.  Keep all follow-up visits as told by your health care provider. This is important.  If your inhaler has a counter, you can check it to determine how full your inhaler is. If your inhaler does not have a counter, ask your health care provider when you will need to refill your inhaler and write the refill date on a calendar or on your inhaler canister. Note that you cannot know when an inhaler is empty by shaking it.  Follow directions on the package insert for care and cleaning of your inhaler and spacer. Contact a health care provider if:  Symptoms are only partially relieved with your inhaler.  You are having trouble using your inhaler.  You have an increase in phlegm.  You have headaches. Get help right away if:  You feel little or no relief after using your inhaler.  You have dizziness.  You have a fast heart rate.  You have chills or a fever.  You have night sweats.  There is blood in your phlegm. Summary  A metered dose inhaler is a handheld device for taking medicine that must be breathed into the lungs (inhaled).  The medicine is delivered by pushing down on a metal canister to release a preset amount of spray and medicine.  Each device contains the amount of medicine that is needed for a preset number of uses (inhalations). This  information is not intended to replace advice given to you by your health care provider. Make sure you discuss any questions you have with your health care provider. Document Revised: 02/03/2017 Document Reviewed: 01/12/2016 Elsevier Patient Education  2020 [employer].

## 2019-12-23 ENCOUNTER — Encounter: Payer: Self-pay | Admitting: Neurology

## 2019-12-23 ENCOUNTER — Other Ambulatory Visit: Payer: Self-pay

## 2019-12-23 ENCOUNTER — Ambulatory Visit (INDEPENDENT_AMBULATORY_CARE_PROVIDER_SITE_OTHER): Payer: Medicare Other | Admitting: Neurology

## 2019-12-23 VITALS — BP 123/57 | HR 87 | Ht 60.0 in | Wt 163.5 lb

## 2019-12-23 DIAGNOSIS — G3184 Mild cognitive impairment, so stated: Secondary | ICD-10-CM | POA: Diagnosis not present

## 2019-12-23 MED ORDER — MEMANTINE HCL 10 MG PO TABS: 10.0000 mg | ORAL_TABLET | Freq: Two times a day (BID) | ORAL | 11 refills | Status: DC

## 2019-12-23 MED ORDER — MEMANTINE HCL 10 MG PO TABS
10.0000 mg | ORAL_TABLET | Freq: Two times a day (BID) | ORAL | 11 refills | Status: DC
Start: 2019-12-23 — End: 2021-10-28

## 2019-12-23 NOTE — Progress Notes (Signed)
PATIENT: Leah Wiggins DOB: 1926-07-28  Chief Complaint  Patient presents with   Memory Loss    MMSE 21/30 - 4 animals. She is here with her daughter, [name]. No change in memory.  They would like to review the MRI brain findings.      HISTORICAL  Leah Wiggins is a 86 years old female, seen in request by her primary care physician Dr. Baird Cancer, Bailey Mech for evaluation of memory loss, she is accompanied by her daughter [name] at today's visit on June 24, 2019.  I have reviewed and summarized the referring note from the referring physician.  She had past medical history of diabetes, hyperlipidemia, hypertension, right breast cancer, status post lobectomy.  She is a mother of 8 children, worked at maintenance in the past, currently lives with her son, she denies family history of memory loss, has hard of hearing.  Around 2019, she was noted to have gradual onset memory loss, tends to misplace things, also began to make mistake in her medications, it is difficult for her to keep up with the schedule of medications, her daughter has placed the medicine pillbox, but she still tends to " mess with it", her daughter also took over her bill payment made it autno draft online, she forgets to pay her bill.  She is quite independent in her daily activity, can dress, feed, bathe without assistant, denies bowel and bladder incontinence, complains of shoulder pain due to previous fall, right shoulder injury  Laboratory evaluations in 2021, normal B12 905 TSH, A1c was mildly elevated 5.7, lipid panel LDL of 69   UPDATE Dec 23 2019: She is accompanied by her daughter at today's clinical visit, overall doing well, personally reviewed MRI of the brain, generalized atrophy no acute abnormality  She has good appetite, ambulate without pain, reading her Bible and magazine all day long,  REVIEW OF SYSTEMS: Full 14 system review of systems performed and notable only for as above All other review of  systems were negative   ALLERGIES: Allergies  Allergen Reactions   Bactrim [Sulfamethoxazole-Trimethoprim] Swelling    Marked angioedema requiring hospitalization   Penicillins Other (See Comments)    Has patient had a PCN reaction causing immediate rash, facial/tongue/throat swelling, SOB or lightheadedness with hypotension: Unk Has patient had a PCN reaction causing severe rash involving mucus membranes or skin necrosis: Unk Has patient had a PCN reaction that required hospitalization: Unk Has patient had a PCN reaction occurring within the last 10 years: No If all of the above answers are "NO", then may proceed with Cephalosporin use.    HOME MEDICATIONS: Current Outpatient Medications  Medication Sig Dispense Refill   alendronate (FOSAMAX) 70 MG tablet TAKE 1 TABLET EVERY WEDNESDAY WITH A FULL GLASS OF WATER ON AN EMPTY STOMACH 12 tablet 3   aspirin EC 81 MG tablet Take 81 mg by mouth daily.     Blood Glucose Monitoring Suppl (FREESTYLE FREEDOM LITE) w/Device KIT Use to check blood sugar 3 times a day. Dx code e11.65 1 kit 3   clotrimazole-betamethasone (LOTRISONE) cream Apply to affected area 2 times daily prn 30 g 0   EPINEPHrine 0.3 mg/0.3 mL IJ SOAJ injection Inject 0.3 mLs (0.3 mg total) into the muscle once as needed (prn worsening angioedema). 1 Device 0   gabapentin (NEURONTIN) 100 MG capsule One capsule po nightly 90 capsule 2   glucose blood (ONETOUCH VERIO) test strip Use as instructed to check blood sugar 3 times a day. Dx  code e11.65 300 each 2   metFORMIN (GLUCOPHAGE-XR) 500 MG 24 hr tablet TAKE 1 TABLET TWICE A DAY 180 tablet 1   MICARDIS 40 MG tablet Take 1 tablet (40 mg total) by mouth daily. 90 tablet 3   mometasone (ELOCON) 0.1 % lotion Apply 1 application topically daily as needed (IRRITATION).   0   mupirocin ointment (BACTROBAN) 2 % 1 application as needed.   0   omeprazole (PRILOSEC) 20 MG capsule Take 1 capsule (20 mg total) by mouth daily. 90  capsule 0   OneTouch Delica Lancets 00T MISC Use as directed to check blood sugars 1 time per day dx: e11.65 50 each 11   pravastatin (PRAVACHOL) 40 MG tablet Take 1 tablet (40 mg total) by mouth daily. 90 tablet 3   Spacer/Aero-Holding Chambers (AEROCHAMBER PLUS) inhaler Use as instructed 1 each 2   tiotropium (SPIRIVA) 18 MCG inhalation capsule Place 1 capsule (18 mcg total) into inhaler and inhale daily. 90 capsule 2   ferrous sulfate 325 (65 FE) MG EC tablet Take 1 tablet (325 mg total) by mouth 3 (three) times daily with meals. 90 tablet 2   No current facility-administered medications for this visit.    PAST MEDICAL HISTORY: Past Medical History:  Diagnosis Date   Arthritis    "shoulders" (05/04/2017)   Breast cancer, right breast ([place]) 1991   GERD (gastroesophageal reflux disease)    Hard of hearing    Hyperlipidemia    Hypertension    Memory loss    Non-stress test nonreactive, 04/24/12, normal 12/10/2012   OSA on CPAP    "don't wear it all the time" (05/04/2017)   PVD (peripheral vascular disease) ([place])    Tobacco use 12/10/2012   Type II diabetes mellitus ([place])     PAST SURGICAL HISTORY: Past Surgical History:  Procedure Laterality Date   APPENDECTOMY     BIOPSY  01/19/2018   Procedure: BIOPSY;  Surgeon: Carol Ada, MD;  Location: [facility];  Service: Endoscopy;;   BREAST BIOPSY Right 1991   CARDIOVASCULAR STRESS TEST  04/24/2012   No significant ST segment change suggestive of ischemia.   CATARACT EXTRACTION, BILATERAL Bilateral    COLONOSCOPY WITH PROPOFOL N/A 01/19/2018   Procedure: COLONOSCOPY WITH PROPOFOL;  Surgeon: Carol Ada, MD;  Location: [facility];  Service: Endoscopy;  Laterality: N/A;   ESOPHAGOGASTRODUODENOSCOPY (EGD) WITH PROPOFOL N/A 01/19/2018   Procedure: ESOPHAGOGASTRODUODENOSCOPY (EGD) WITH PROPOFOL;  Surgeon: Carol Ada, MD;  Location: [facility];  Service: Endoscopy;  Laterality: N/A;   JOINT REPLACEMENT       LOWER EXTREMITY ARTERIAL DOPPLER Bilateral 05/07/2012   Left ABI-demonstrated mild arterial insufficiency. Right CIA 50-69% diameter reduction. Right SFA less than 50% diameter reduction. Left SFA 70-99% diameter reduction. Bilateral Runoff-Posterior tibial arteries appeared occluded.   MASTECTOMY PARTIAL / LUMPECTOMY W/ AXILLARY LYMPHADENECTOMY Right 1991    TOTAL HIP ARTHROPLASTY Right 2011    FAMILY HISTORY: Family History  Problem Relation Age of Onset   Heart disease Mother    Stroke Mother    Stroke Father    Cancer Sister        GIST   Cancer Brother        thyroid    Diabetes Son    Stroke Son     SOCIAL HISTORY: Social History   Socioeconomic History   Marital status: Widowed    Spouse name: Not on file   Number of children: 8   Years of education: 8th grade   Highest  education level: Not on file  Occupational History   Occupation: retired  Tobacco Use   Smoking status: Current Some Day Smoker    Packs/day: 0.25    Years: 70.00    Pack years: 17.50    Types: Cigarettes    Start date: 03/08/1947   Smokeless tobacco: Never Used   Tobacco comment: has tried cold [place]  Vaping Use   Vaping Use: Never used  Substance and Sexual Activity   Alcohol use: No    Alcohol/week: 0.0 standard drinks   Drug use: No   Sexual activity: Not Currently  Other Topics Concern   Not on file  Social History Narrative   Lives at home with her son.   She had eight children (two sets of twins).   No daily use of caffeine.   Right-handed.   Social Determinants of Health   Financial Resource Strain:    Difficulty of Paying Living Expenses: Not on file  Food Insecurity:    Worried About Charity fundraiser in the Last Year: Not on file   [employer] of Food in the Last Year: Not on file  Transportation Needs:    Lack of Transportation (Medical): Not on file   Lack of Transportation (Non-Medical): Not on file  Physical Activity:    Days of Exercise  per Week: Not on file   Minutes of Exercise per Session: Not on file  Stress:    Feeling of Stress : Not on file  Social Connections:    Frequency of Communication with Friends and Family: Not on file   Frequency of Social Gatherings with Friends and Family: Not on file   Attends Religious Services: Not on file   Active Member of Clubs or Organizations: Not on file   Attends Archivist Meetings: Not on file   Marital Status: Not on file  Intimate Partner Violence:    Fear of Current or Ex-Partner: Not on file   Emotionally Abused: Not on file   Physically Abused: Not on file   Sexually Abused: Not on file     PHYSICAL EXAM   Vitals:   12/23/19 1521  BP: (!) 123/57  Pulse: 87  Weight: 163 lb 8 oz (74.2 kg)  Height: 5' (1.524 m)   Not recorded     Body mass index is 31.93 kg/m.  PHYSICAL EXAMNIATION:  Gen: NAD, conversant, well nourised, well groomed        NEUROLOGICAL EXAM:  MMSE - Mini Mental State Exam 12/23/2019 06/24/2019  Orientation to time 4 5  Orientation to Place 5 5  Registration 3 3  Attention/ Calculation 0 2  Recall 2 0  Language- name 2 objects 2 2  Language- repeat 0 1  Language- follow 3 step command 3 3  Language- read & follow direction 1 1  Write a sentence 1 1  Copy design 0 0  Total score 21 23  Animal naming 5   CRANIAL NERVES: CN II: Visual fields are full to confrontation. Pupils are round equal and briskly reactive to light. CN III, IV, VI: extraocular movement are normal. No ptosis. CN V: Facial sensation is intact to light touch CN VII: Face is symmetric with normal eye closure  CN VIII: Hearing is normal to causal conversation. CN IX, X: Phonation is normal. CN XI: Head turning and shoulder shrug are intact  MOTOR: Limited upper extremity proximal muscle strength examination due to shoulder pain, there was no significant weakness of bilateral upper or  lower extremity strength  REFLEXES: Reflexes are  1 and symmetric at the biceps, triceps, knees, and ankles. Plantar responses are flexor.  SENSORY: Intact to light touch, pinprick and vibratory sensation are intact in fingers and toes.  COORDINATION: There is no trunk or limb dysmetria noted.  GAIT/STANCE: She needs push-up to get up from seated position, fairly steady, kyphosis  DIAGNOSTIC DATA (LABS, IMAGING, TESTING) - I reviewed patient records, labs, notes, testing and imaging myself where available.   ASSESSMENT AND PLAN  KAMPBELL HOLAWAY is a 86 y.o. female   Mild cognitive impairment  Mini-Mental Status Examination 23 out of 30  Laboratory evaluation showed no treatable etiology  MRI of the brain showed generalized atrophy, age-related changes, no acute abnormality.  Most likely central nervous system degenerative disorder,   Start Namenda 10 mg twice a day  Marcial Pacas, M.D. Ph.D.  [facility] [address] [place], [place] [address] Ph: [phone] Fax: [phone]  CC: Glendale Chard, MD

## 2019-12-26 DIAGNOSIS — M25512 Pain in left shoulder: Secondary | ICD-10-CM | POA: Diagnosis not present

## 2020-01-08 ENCOUNTER — Other Ambulatory Visit: Payer: Self-pay

## 2020-01-08 ENCOUNTER — Encounter: Payer: Self-pay | Admitting: Internal Medicine

## 2020-01-08 ENCOUNTER — Ambulatory Visit (INDEPENDENT_AMBULATORY_CARE_PROVIDER_SITE_OTHER): Payer: Medicare Other | Admitting: Internal Medicine

## 2020-01-08 VITALS — BP 132/84 | HR 79 | Temp 98.7°F | Ht 60.0 in | Wt 163.6 lb

## 2020-01-08 DIAGNOSIS — J449 Chronic obstructive pulmonary disease, unspecified: Secondary | ICD-10-CM | POA: Diagnosis not present

## 2020-01-08 DIAGNOSIS — M19012 Primary osteoarthritis, left shoulder: Secondary | ICD-10-CM | POA: Diagnosis not present

## 2020-01-08 DIAGNOSIS — Z6831 Body mass index (BMI) 31.0-31.9, adult: Secondary | ICD-10-CM

## 2020-01-08 DIAGNOSIS — E6609 Other obesity due to excess calories: Secondary | ICD-10-CM

## 2020-01-08 DIAGNOSIS — E1122 Type 2 diabetes mellitus with diabetic chronic kidney disease: Secondary | ICD-10-CM

## 2020-01-08 DIAGNOSIS — N182 Chronic kidney disease, stage 2 (mild): Secondary | ICD-10-CM

## 2020-01-08 DIAGNOSIS — I129 Hypertensive chronic kidney disease with stage 1 through stage 4 chronic kidney disease, or unspecified chronic kidney disease: Secondary | ICD-10-CM

## 2020-01-08 MED ORDER — ONETOUCH VERIO VI STRP: ORAL_STRIP | 2 refills | Status: DC

## 2020-01-08 NOTE — Patient Instructions (Signed)
Diabetes Mellitus and Foot Care Foot care is an important part of your health, especially when you have diabetes. Diabetes may cause you to have problems because of poor blood flow (circulation) to your feet and legs, which can cause your skin to:  Become thinner and drier.  Break more easily.  Heal more slowly.  Peel and crack. You may also have nerve damage (neuropathy) in your legs and feet, causing decreased feeling in them. This means that you may not notice minor injuries to your feet that could lead to more serious problems. Noticing and addressing any potential problems early is the best way to prevent future foot problems. How to care for your feet Foot hygiene  Wash your feet daily with warm water and mild soap. Do not use hot water. Then, pat your feet and the areas between your toes until they are completely dry. Do not soak your feet as this can dry your skin.  Trim your toenails straight across. Do not dig under them or around the cuticle. File the edges of your nails with an emery board or nail file.  Apply a moisturizing lotion or petroleum jelly to the skin on your feet and to dry, brittle toenails. Use lotion that does not contain alcohol and is unscented. Do not apply lotion between your toes. Shoes and socks  Wear clean socks or stockings every day. Make sure they are not too tight. Do not wear knee-high stockings since they may decrease blood flow to your legs.  Wear shoes that fit properly and have enough cushioning. Always look in your shoes before you put them on to be sure there are no objects inside.  To break in new shoes, wear them for just a few hours a day. This prevents injuries on your feet. Wounds, scrapes, corns, and calluses  Check your feet daily for blisters, cuts, bruises, sores, and redness. If you cannot see the bottom of your feet, use a mirror or ask someone for help.  Do not cut corns or calluses or try to remove them with medicine.  If you  find a minor scrape, cut, or break in the skin on your feet, keep it and the skin around it clean and dry. You may clean these areas with mild soap and water. Do not clean the area with peroxide, alcohol, or iodine.  If you have a wound, scrape, corn, or callus on your foot, look at it several times a day to make sure it is healing and not infected. Check for: ? Redness, swelling, or pain. ? Fluid or blood. ? Warmth. ? Pus or a bad smell. General instructions  Do not cross your legs. This may decrease blood flow to your feet.  Do not use heating pads or hot water bottles on your feet. They may burn your skin. If you have lost feeling in your feet or legs, you may not know this is happening until it is too late.  Protect your feet from hot and cold by wearing shoes, such as at the beach or on hot pavement.  Schedule a complete foot exam at least once a year (annually) or more often if you have foot problems. If you have foot problems, report any cuts, sores, or bruises to your health care provider immediately. Contact a health care provider if:  You have a medical condition that increases your risk of infection and you have any cuts, sores, or bruises on your feet.  You have an injury that is not   healing.  You have redness on your legs or feet.  You feel burning or tingling in your legs or feet.  You have pain or cramps in your legs and feet.  Your legs or feet are numb.  Your feet always feel cold.  You have pain around a toenail. Get help right away if:  You have a wound, scrape, corn, or callus on your foot and: ? You have pain, swelling, or redness that gets worse. ? You have fluid or blood coming from the wound, scrape, corn, or callus. ? Your wound, scrape, corn, or callus feels warm to the touch. ? You have pus or a bad smell coming from the wound, scrape, corn, or callus. ? You have a fever. ? You have a red line going up your leg. Summary  Check your feet every day  for cuts, sores, red spots, swelling, and blisters.  Moisturize feet and legs daily.  Wear shoes that fit properly and have enough cushioning.  If you have foot problems, report any cuts, sores, or bruises to your health care provider immediately.  Schedule a complete foot exam at least once a year (annually) or more often if you have foot problems. This information is not intended to replace advice given to you by your health care provider. Make sure you discuss any questions you have with your health care provider. Document Revised: 11/14/2018 Document Reviewed: 03/25/2016 Elsevier Patient Education  2020 Elsevier Inc.  

## 2020-01-08 NOTE — Progress Notes (Signed)
error 

## 2020-01-08 NOTE — Progress Notes (Signed)
I,[name],acting as a Education administrator for Maximino Greenland, MD.,have documented all relevant documentation on the behalf of Maximino Greenland, MD,as directed by  Maximino Greenland, MD while in the presence of Maximino Greenland, MD.  This visit occurred during the SARS-CoV-2 public health emergency.  Safety protocols were in place, including screening questions prior to the visit, additional usage of staff PPE, and extensive cleaning of exam room while observing appropriate contact time as indicated for disinfecting solutions.  Subjective:     Patient ID: Leah Wiggins , female    DOB: Nov 05, 1926 , 86 y.o.   MRN: [id number]   Chief Complaint  Patient presents with  . Diabetes  . Hypertension    HPI  She presents today for DM/HTN check. She is accompanied by her daughter today.   Diabetes She presents for her follow-up diabetic visit. She has type 2 diabetes mellitus. There are no hypoglycemic associated symptoms. Pertinent negatives for diabetes include no blurred vision. There are no hypoglycemic complications. Risk factors for coronary artery disease include diabetes mellitus, dyslipidemia, hypertension, obesity, sedentary lifestyle and post-menopausal.  Hypertension This is a chronic problem. The current episode started more than 1 year ago. The problem has been gradually improving since onset. The problem is controlled. Pertinent negatives include no blurred vision. The current treatment provides moderate improvement.     Past Medical History:  Diagnosis Date  . Arthritis    "shoulders" (05/04/2017)  . Breast cancer, right breast ([place]) 1991  . GERD (gastroesophageal reflux disease)   . Hard of hearing   . Hyperlipidemia   . Hypertension   . Memory loss   . Non-stress test nonreactive, 04/24/12, normal 12/10/2012  . OSA on CPAP    "don't wear it all the time" (05/04/2017)  . PVD (peripheral vascular disease) ([place])   . Tobacco use 12/10/2012  . Type II diabetes mellitus (HCC)       Family History  Problem Relation Age of Onset  . Heart disease Mother   . Stroke Mother   . Stroke Father   . Cancer Sister        GIST  . Cancer Brother        thyroid   . Diabetes Son   . Stroke Son      Current Outpatient Medications:  .  alendronate (FOSAMAX) 70 MG tablet, TAKE 1 TABLET EVERY WEDNESDAY WITH A FULL GLASS OF WATER ON AN EMPTY STOMACH, Disp: 12 tablet, Rfl: 3 .  aspirin EC 81 MG tablet, Take 81 mg by mouth daily., Disp: , Rfl:  .  Blood Glucose Monitoring Suppl (FREESTYLE FREEDOM LITE) w/Device KIT, Use to check blood sugar 3 times a day. Dx code e11.65, Disp: 1 kit, Rfl: 3 .  clotrimazole-betamethasone (LOTRISONE) cream, Apply to affected area 2 times daily prn, Disp: 30 g, Rfl: 0 .  ferrous sulfate 325 (65 FE) MG EC tablet, Take 1 tablet (325 mg total) by mouth 3 (three) times daily with meals., Disp: 90 tablet, Rfl: 2 .  gabapentin (NEURONTIN) 100 MG capsule, One capsule po nightly, Disp: 90 capsule, Rfl: 2 .  memantine (NAMENDA) 10 MG tablet, Take 1 tablet (10 mg total) by mouth 2 (two) times daily., Disp: 60 tablet, Rfl: 11 .  metFORMIN (GLUCOPHAGE-XR) 500 MG 24 hr tablet, TAKE 1 TABLET TWICE A DAY, Disp: 180 tablet, Rfl: 1 .  MICARDIS 40 MG tablet, Take 1 tablet (40 mg total) by mouth daily., Disp: 90 tablet, Rfl: 3 .  mometasone (  ELOCON) 0.1 % lotion, Apply 1 application topically daily as needed (IRRITATION). , Disp: , Rfl: 0 .  mupirocin ointment (BACTROBAN) 2 %, 1 application as needed. , Disp: , Rfl: 0 .  omeprazole (PRILOSEC) 20 MG capsule, Take 1 capsule (20 mg total) by mouth daily., Disp: 90 capsule, Rfl: 0 .  pravastatin (PRAVACHOL) 40 MG tablet, Take 1 tablet (40 mg total) by mouth daily., Disp: 90 tablet, Rfl: 3 .  Spacer/Aero-Holding Chambers (AEROCHAMBER PLUS) inhaler, Use as instructed, Disp: 1 each, Rfl: 2 .  tiotropium (SPIRIVA) 18 MCG inhalation capsule, Place 1 capsule (18 mcg total) into inhaler and inhale daily., Disp: 90 capsule, Rfl:  2 .  EPINEPHrine 0.3 mg/0.3 mL IJ SOAJ injection, Inject 0.3 mLs (0.3 mg total) into the muscle once as needed (prn worsening angioedema). (Patient not taking: Reported on 01/08/2020), Disp: 1 Device, Rfl: 0 .  glucose blood (FREESTYLE LITE) test strip, Use to check blood sugar 3 times a day. Dx code e11.65, Disp: 150 each, Rfl: 3   Allergies  Allergen Reactions  . Bactrim [Sulfamethoxazole-Trimethoprim] Swelling    Marked angioedema requiring hospitalization  . Penicillins Other (See Comments)    Has patient had a PCN reaction causing immediate rash, facial/tongue/throat swelling, SOB or lightheadedness with hypotension: Unk Has patient had a PCN reaction causing severe rash involving mucus membranes or skin necrosis: Unk Has patient had a PCN reaction that required hospitalization: Unk Has patient had a PCN reaction occurring within the last 10 years: No If all of the above answers are "NO", then may proceed with Cephalosporin use.     Review of Systems  Constitutional: Negative.   Eyes: Negative for blurred vision.  Respiratory: Negative.   Cardiovascular: Negative.   Gastrointestinal: Negative.   Psychiatric/Behavioral: Negative.   All other systems reviewed and are negative.    Today's Vitals   01/08/20 1440  BP: 132/84  Pulse: 79  Temp: 98.7 F (37.1 C)  TempSrc: Oral  Weight: 163 lb 9.6 oz (74.2 kg)  Height: 5' (1.524 m)   Body mass index is 31.95 kg/m.  Wt Readings from Last 3 Encounters:  01/08/20 163 lb 9.6 oz (74.2 kg)  12/23/19 163 lb 8 oz (74.2 kg)  11/21/19 164 lb 12.8 oz (74.8 kg)   Objective:  Physical Exam Vitals and nursing note reviewed.  Constitutional:      Appearance: Normal appearance. She is obese.  HENT:     Head: Normocephalic and atraumatic.  Cardiovascular:     Rate and Rhythm: Normal rate and regular rhythm.     Heart sounds: Normal heart sounds.  Pulmonary:     Effort: Pulmonary effort is normal.     Comments: Decreased breath sounds  b/l Skin:    General: Skin is warm.  Neurological:     General: No focal deficit present.     Mental Status: She is alert and oriented to person, place, and time.         Assessment And Plan:     1. Type 2 diabetes mellitus with stage 2 chronic kidney disease, without long-term current use of insulin (HCC) Comments: Chronic, I will check labs as listed below. Encouraged to limit her intake of sugary beverages and processed foods. - BMP8+EGFR - Hemoglobin A1c  2. Hypertensive nephropathy Comments: Chronic, fair control. She will continue with current meds. She is encouraged to avoid adding salt to her foods.   3. Primary osteoarthritis of left shoulder Comments: Chronic, advised to apply Voltaren gel to  affected area twice daily prn. If persistent, will refer to Ortho for further eval and radiographic studies.   4. Class 1 obesity due to excess calories with serious comorbidity and body mass index (BMI) of 31.0 to 31.9 in adult Comments: She is encouraged to strive to lose ten pounds to decrease cardiac risk. Advised to perform chair exercises while watching TV.   5. Chronic obstructive pulmonary disease, unspecified COPD type ([place]) Comments: She was given samples of Brextri at her last visit. She has done well w/ this inhaler and wants to continue wiht meds.     Patient was given opportunity to ask questions. Patient verbalized understanding of the plan and was able to repeat key elements of the plan. All questions were answered to their satisfaction.  Maximino Greenland, MD   I, Maximino Greenland, MD, have reviewed all documentation for this visit. The documentation on 02/02/20 for the exam, diagnosis, procedures, and orders are all accurate and complete.  THE PATIENT IS ENCOURAGED TO PRACTICE SOCIAL DISTANCING DUE TO THE COVID-19 PANDEMIC.

## 2020-01-09 LAB — BMP8+EGFR
BUN/Creatinine Ratio: 24 (ref 12–28)
BUN: 22 mg/dL (ref 10–36)
CO2: 22 mmol/L (ref 20–29)
Calcium: 9 mg/dL (ref 8.7–10.3)
Chloride: 109 mmol/L — ABNORMAL HIGH (ref 96–106)
Creatinine, Ser: 0.91 mg/dL (ref 0.57–1.00)
GFR calc Af Amer: 64 mL/min/{1.73_m2} (ref >59)
GFR calc non Af Amer: 55 mL/min/{1.73_m2} — ABNORMAL LOW (ref >59)
Glucose: 82 mg/dL (ref 65–99)
Potassium: 4.4 mmol/L (ref 3.5–5.2)
Sodium: 144 mmol/L (ref 134–144)

## 2020-01-09 LAB — HEMOGLOBIN A1C
Est. average glucose Bld gHb Est-mCnc: 134 mg/dL
Hgb A1c MFr Bld: 6.3 % — ABNORMAL HIGH (ref 4.8–5.6)

## 2020-01-10 ENCOUNTER — Other Ambulatory Visit: Payer: Self-pay

## 2020-01-10 MED ORDER — FREESTYLE LITE TEST VI STRP: ORAL_STRIP | 3 refills | Status: DC

## 2020-01-29 ENCOUNTER — Telehealth: Payer: Self-pay

## 2020-01-29 NOTE — Telephone Encounter (Signed)
The pt was notified that her samples of [name] is ready for pickup and to try and pick them up before 2 pm today.

## 2020-02-11 ENCOUNTER — Ambulatory Visit (HOSPITAL_COMMUNITY)
Admission: EM | Admit: 2020-02-11 | Discharge: 2020-02-11 | Disposition: A | Discharge status: Home / Self Care | Payer: Medicare Other | Attending: Emergency Medicine | Admitting: Emergency Medicine

## 2020-02-11 ENCOUNTER — Other Ambulatory Visit: Payer: Self-pay | Admitting: Internal Medicine

## 2020-02-11 ENCOUNTER — Ambulatory Visit (INDEPENDENT_AMBULATORY_CARE_PROVIDER_SITE_OTHER): Payer: Medicare Other

## 2020-02-11 ENCOUNTER — Encounter (HOSPITAL_COMMUNITY): Payer: Self-pay

## 2020-02-11 ENCOUNTER — Telehealth: Payer: Self-pay

## 2020-02-11 DIAGNOSIS — Z79899 Other long term (current) drug therapy: Secondary | ICD-10-CM | POA: Diagnosis not present

## 2020-02-11 DIAGNOSIS — J449 Chronic obstructive pulmonary disease, unspecified: Secondary | ICD-10-CM | POA: Insufficient documentation

## 2020-02-11 DIAGNOSIS — R5383 Other fatigue: Secondary | ICD-10-CM

## 2020-02-11 DIAGNOSIS — F17210 Nicotine dependence, cigarettes, uncomplicated: Secondary | ICD-10-CM | POA: Diagnosis not present

## 2020-02-11 DIAGNOSIS — R0600 Dyspnea, unspecified: Secondary | ICD-10-CM | POA: Diagnosis not present

## 2020-02-11 DIAGNOSIS — R059 Cough, unspecified: Secondary | ICD-10-CM

## 2020-02-11 DIAGNOSIS — Z20822 Contact with and (suspected) exposure to covid-19: Secondary | ICD-10-CM | POA: Diagnosis not present

## 2020-02-11 DIAGNOSIS — R0602 Shortness of breath: Secondary | ICD-10-CM | POA: Insufficient documentation

## 2020-02-11 DIAGNOSIS — Z7951 Long term (current) use of inhaled steroids: Secondary | ICD-10-CM | POA: Insufficient documentation

## 2020-02-11 DIAGNOSIS — R0609 Other forms of dyspnea: Secondary | ICD-10-CM

## 2020-02-11 LAB — BASIC METABOLIC PANEL
Anion gap: 11 (ref 5–15)
BUN: 19 mg/dL (ref 8–23)
CO2: 24 mmol/L (ref 22–32)
Calcium: 8.8 mg/dL — ABNORMAL LOW (ref 8.9–10.3)
Chloride: 103 mmol/L (ref 98–111)
Creatinine, Ser: 1.01 mg/dL — ABNORMAL HIGH (ref 0.44–1.00)
GFR, Estimated: 53 mL/min — ABNORMAL LOW (ref >60)
Glucose, Bld: 164 mg/dL — ABNORMAL HIGH (ref 70–99)
Potassium: 4.1 mmol/L (ref 3.5–5.1)
Sodium: 138 mmol/L (ref 135–145)

## 2020-02-11 LAB — CBC WITH DIFFERENTIAL/PLATELET
Abs Immature Granulocytes: 0.02 10*3/uL (ref 0.00–0.07)
Basophils Absolute: 0 10*3/uL (ref 0.0–0.1)
Basophils Relative: 1 %
Eosinophils Absolute: 0.3 10*3/uL (ref 0.0–0.5)
Eosinophils Relative: 5 %
HCT: 28.3 % — ABNORMAL LOW (ref 36.0–46.0)
Hemoglobin: 9.1 g/dL — ABNORMAL LOW (ref 12.0–15.0)
Immature Granulocytes: 0 %
Lymphocytes Relative: 11 %
Lymphs Abs: 0.6 10*3/uL — ABNORMAL LOW (ref 0.7–4.0)
MCH: 23.5 pg — ABNORMAL LOW (ref 26.0–34.0)
MCHC: 32.2 g/dL (ref 30.0–36.0)
MCV: 73.1 fL — ABNORMAL LOW (ref 80.0–100.0)
Monocytes Absolute: 0.7 10*3/uL (ref 0.1–1.0)
Monocytes Relative: 13 %
Neutro Abs: 4 10*3/uL (ref 1.7–7.7)
Neutrophils Relative %: 70 %
Platelets: 85 10*3/uL — ABNORMAL LOW (ref 150–400)
RBC: 3.87 MIL/uL (ref 3.87–5.11)
RDW: 18.6 % — ABNORMAL HIGH (ref 11.5–15.5)
WBC: 5.7 10*3/uL (ref 4.0–10.5)
nRBC: 0 % (ref 0.0–0.2)

## 2020-02-11 LAB — RESP PANEL BY RT-PCR (FLU A&B, COVID) ARPGX2
Influenza A by PCR: NEGATIVE
Influenza B by PCR: NEGATIVE
SARS Coronavirus 2 by RT PCR: NEGATIVE

## 2020-02-11 NOTE — ED Provider Notes (Signed)
[place]    CSN: [id number] Arrival date & time: 02/11/20  1036      History   Chief Complaint Chief Complaint  Patient presents with  . Shortness of Breath  . Cough    HPI Leah Wiggins is a 86 y.o. female.   Leah Wiggins presents with family with complaints of shortness of breath on exertion. No chest pain . No cough. Minimal congestion. Fatigue. No chest pain . No leg swelling. No known ill contacts. Has received covid-19 vaccine series. history of OSA, previous tobacco use and COPD, gerd, htn.     ROS per HPI, negative if not otherwise mentioned.      Past Medical History:  Diagnosis Date  . Arthritis    "shoulders" (05/04/2017)  . Breast cancer, right breast ([place]) 1991  . GERD (gastroesophageal reflux disease)   . Hard of hearing   . Hyperlipidemia   . Hypertension   . Memory loss   . Non-stress test nonreactive, 04/24/12, normal 12/10/2012  . OSA on CPAP    "don't wear it all the time" (05/04/2017)  . PVD (peripheral vascular disease) ([place])   . Tobacco use 12/10/2012  . Type II diabetes mellitus [facility])     Patient Active Problem List   Diagnosis Date Noted  . Mild cognitive impairment 12/23/2019  . Memory loss 06/24/2019  . Chronic obstructive pulmonary disease ([place]) 04/06/2018  . Prediabetes 02/07/2018  . Iron deficiency anemia due to chronic blood loss 12/25/2017  . Anaphylaxis 05/04/2017  . Rash and nonspecific skin eruption 05/04/2017  . History of breast cancer 08/14/2014  . Empty sella ([place]) 07/14/2014  . Pain in the chest 04/28/2014  . Diabetes mellitus without complication ([place]) 89/38/1017  . Chest pain 03/03/2014  . Essential hypertension 06/24/2013  . PAD (peripheral artery disease), abnormal ABIs 12/10/2012  . Non-stress test nonreactive, 04/24/12, normal 12/10/2012  . Dyslipidemia 12/10/2012  . Tobacco use 12/10/2012  . Thrombocytopenia ([place]) 01/24/2012  . RESTLESS LEG SYNDROME 02/16/2010  . METHICILLIN  SUSECPTIBLE PNEUMONIA STAPH AUREUS 01/13/2010  . HIP THI LEG&ANK ABRASION/FRICION BURN W/O INF 01/13/2010    Past Surgical History:  Procedure Laterality Date  . APPENDECTOMY    . BIOPSY  01/19/2018   Procedure: BIOPSY;  Surgeon: Carol Ada, MD;  Location: [facility];  Service: Endoscopy;;  . BREAST BIOPSY Right 1991  . CARDIOVASCULAR STRESS TEST  04/24/2012   No significant ST segment change suggestive of ischemia.  Marland Kitchen CATARACT EXTRACTION, BILATERAL Bilateral   . COLONOSCOPY WITH PROPOFOL N/A 01/19/2018   Procedure: COLONOSCOPY WITH PROPOFOL;  Surgeon: Carol Ada, MD;  Location: [facility];  Service: Endoscopy;  Laterality: N/A;  . ESOPHAGOGASTRODUODENOSCOPY (EGD) WITH PROPOFOL N/A 01/19/2018   Procedure: ESOPHAGOGASTRODUODENOSCOPY (EGD) WITH PROPOFOL;  Surgeon: Carol Ada, MD;  Location: [facility];  Service: Endoscopy;  Laterality: N/A;  . JOINT REPLACEMENT    . LOWER EXTREMITY ARTERIAL DOPPLER Bilateral 05/07/2012   Left ABI-demonstrated mild arterial insufficiency. Right CIA 50-69% diameter reduction. Right SFA less than 50% diameter reduction. Left SFA 70-99% diameter reduction. Bilateral Runoff-Posterior tibial arteries appeared occluded.  Marland Kitchen MASTECTOMY PARTIAL / LUMPECTOMY W/ AXILLARY LYMPHADENECTOMY Right 1991   . TOTAL HIP ARTHROPLASTY Right 2011    OB History   No obstetric history on file.      Home Medications    Prior to Admission medications   Medication Sig Start Date End Date Taking? Authorizing Provider  alendronate (FOSAMAX) 70 MG tablet TAKE 1 TABLET EVERY WEDNESDAY WITH A  FULL GLASS OF WATER ON AN EMPTY STOMACH 01/03/19  Yes Glendale Chard, MD  aspirin EC 81 MG tablet Take 81 mg by mouth daily.   Yes [provider]  gabapentin (NEURONTIN) 100 MG capsule One capsule po nightly 11/21/19  Yes Glendale Chard, MD  memantine (NAMENDA) 10 MG tablet Take 1 tablet (10 mg total) by mouth 2 (two) times daily. 12/23/19  Yes Marcial Pacas, MD  metFORMIN  (GLUCOPHAGE-XR) 500 MG 24 hr tablet TAKE 1 TABLET TWICE A DAY 06/26/19  Yes Glendale Chard, MD  MICARDIS 40 MG tablet TAKE 1 TABLET DAILY 02/11/20  Yes Glendale Chard, MD  omeprazole (PRILOSEC) 20 MG capsule Take 1 capsule (20 mg total) by mouth daily. 11/21/19 11/20/20 Yes Glendale Chard, MD  pravastatin (PRAVACHOL) 40 MG tablet Take 1 tablet (40 mg total) by mouth daily. 05/20/19  Yes Glendale Chard, MD  Blood Glucose Monitoring Suppl (FREESTYLE FREEDOM LITE) w/Device KIT Use to check blood sugar 3 times a day. Dx code e11.65 09/18/19   Glendale Chard, MD  clotrimazole-betamethasone Donalynn Furlong) cream Apply to affected area 2 times daily prn 10/31/15   Janne Napoleon, NP  EPINEPHrine 0.3 mg/0.3 mL IJ SOAJ injection Inject 0.3 mLs (0.3 mg total) into the muscle once as needed (prn worsening angioedema). Patient not taking: Reported on 01/08/2020 05/05/17   Nita Sells, MD  ferrous sulfate 325 (65 FE) MG EC tablet Take 1 tablet (325 mg total) by mouth 3 (three) times daily with meals. 12/25/17 01/08/20  Henreitta Leber, MD  glucose blood (FREESTYLE LITE) test strip Use to check blood sugar 3 times a day. Dx code e11.65 01/10/20   Glendale Chard, MD  mometasone (ELOCON) 0.1 % lotion Apply 1 application topically daily as needed (IRRITATION).  04/15/14   [provider]  mupirocin ointment (BACTROBAN) 2 % 1 application as needed.  04/11/17   [provider]  Spacer/Aero-Holding Chambers (AEROCHAMBER PLUS) inhaler Use as instructed 08/19/19   Glendale Chard, MD  tiotropium (SPIRIVA) 18 MCG inhalation capsule Place 1 capsule (18 mcg total) into inhaler and inhale daily. 08/19/19   Glendale Chard, MD    Family History Family History  Problem Relation Age of Onset  . Heart disease Mother   . Stroke Mother   . Stroke Father   . Cancer Sister        GIST  . Cancer Brother        thyroid   . Diabetes Son   . Stroke Son     Social History Social History   Tobacco Use  . Smoking status:  Current Some Day Smoker    Packs/day: 0.25    Years: 70.00    Pack years: 17.50    Types: Cigarettes    Start date: 03/08/1947  . Smokeless tobacco: Never Used  . Tobacco comment: has tried cold [place]  Vaping Use  . Vaping Use: Never used  Substance Use Topics  . Alcohol use: No    Alcohol/week: 0.0 standard drinks  . Drug use: No     Allergies   Bactrim [sulfamethoxazole-trimethoprim] and Penicillins   Review of Systems Review of Systems   Physical Exam Triage Vital Signs ED Triage Vitals  Enc Vitals Group     BP 02/11/20 1055 (!) 158/70     Pulse Rate 02/11/20 1055 92     Resp 02/11/20 1055 (!) 28     Temp 02/11/20 1055 98.3 F (36.8 C)     Temp Source 02/11/20 1055 Oral  SpO2 02/11/20 1052 100 %     Weight --      Height --      Head Circumference --      Peak Flow --      Pain Score 02/11/20 1051 0     Pain Loc --      Pain Edu? --      Excl. in [place]? --    No data found.  Updated Vital Signs BP (!) 158/70 (BP Location: Right Arm)   Pulse 92   Temp 98.3 F (36.8 C) (Oral)   Resp (!) 28   SpO2 100%   Visual Acuity Right Eye Distance:   Left Eye Distance:   Bilateral Distance:    Right Eye Near:   Left Eye Near:    Bilateral Near:     Physical Exam Constitutional:      General: She is not in acute distress.    Appearance: She is well-developed.     Comments: HOH  Cardiovascular:     Rate and Rhythm: Normal rate and regular rhythm.  Pulmonary:     Effort: Pulmonary effort is normal. No tachypnea, accessory muscle usage or respiratory distress.     Breath sounds: Normal breath sounds.     Comments: Mild doe noted with ambulation in the hall  Musculoskeletal:     Cervical back: Normal range of motion.     Right lower leg: No edema.     Left lower leg: No edema.  Skin:    General: Skin is warm and dry.  Neurological:     Mental Status: She is alert and oriented to person, place, and time. Mental status is at baseline.     Motor: No  weakness.  Psychiatric:        Mood and Affect: Mood normal.     EKG:  NSR . Previous EKG was available for review. No stwave changes as interpreted by me.   UC Treatments / Results  Labs (all labs ordered are listed, but only abnormal results are displayed) Labs Reviewed  RESP PANEL BY RT-PCR (FLU A&B, COVID) ARPGX2  CBC WITH DIFFERENTIAL/PLATELET  BASIC METABOLIC PANEL    EKG   Radiology DG Chest 2 View  Result Date: 02/11/2020 CLINICAL DATA:  Cough, shortness of breath EXAM: CHEST - 2 VIEW COMPARISON:  03/11/2019 FINDINGS: Mild hyperinflation. Bibasilar atelectasis or scarring. Heart is normal size. No effusions or confluent opacities. No acute bony abnormality. IMPRESSION: Hyperinflation.  Bibasilar atelectasis or scarring. Electronically Signed   By: Rolm Baptise M.D.   On: 02/11/2020 11:20    Procedures Procedures (including critical care time)  Medications Ordered in UC Medications - No data to display  Initial Impression / Assessment and Plan / UC Course  I have reviewed the triage vital signs and the nursing notes.  Pertinent labs & imaging results that were available during my care of the patient were reviewed by me and considered in my medical decision making (see chart for details).     No red flag findings here today. Chest xray without apparent acute changes. No work of breathing at rest. ekg without acute findings. No chest pain . No URI symptoms. Some basic labs obtained today without significant changes in BMP, CBC pending. Will notify if positive. covid screening also in process. encouraged follow up for recheck with PCP. Er precautions provided. Patient and family verbalized understanding and agreeable to plan.    Final Clinical Impressions(s) / UC Diagnoses   Final diagnoses:  Shortness of breath   Discharge Instructions   None    ED Prescriptions    None     PDMP not reviewed this encounter.   Zigmund Gottron, NP 02/11/20 [phone]

## 2020-02-11 NOTE — ED Triage Notes (Signed)
Pt c/o SOB, non-productive cough, congestion, nausea, subjective fever, decreased appetite, malaise for approx 3 days. Took inhaler last night with some improvement to respiratory effort.   Denies v/d, CP, loss of taste/smell.  Inspiratory wheezes bilateral anterior lung fields, tachypnea noted, SOBOE.  Has taken 3 COVID vaccinations.

## 2020-02-11 NOTE — Discharge Instructions (Addendum)
Chest xray overall looks well today without significant new findings.  We are testing for covid-19 as well and would call you if this returns positive, your results will be on your MyCHart.  I am checking some basic labs as well to see if there are any other obvious causes of your symptoms. I will call you to notify you if I am concerned about these.  Please follow up with your primary care provider for recheck of your symptoms.  Return or go to the ER if any worsening- increased shortness of breath , chest pain, fevers, confusion or weakness.

## 2020-02-11 NOTE — Telephone Encounter (Signed)
Called to check on patient. She just got home from the urgent care. She is doing fine.

## 2020-02-18 ENCOUNTER — Encounter: Payer: Self-pay | Admitting: Internal Medicine

## 2020-02-18 ENCOUNTER — Ambulatory Visit (INDEPENDENT_AMBULATORY_CARE_PROVIDER_SITE_OTHER): Payer: Medicare Other | Admitting: Internal Medicine

## 2020-02-18 ENCOUNTER — Other Ambulatory Visit: Payer: Self-pay

## 2020-02-18 VITALS — BP 116/74 | HR 83 | Temp 98.5°F | Ht 60.0 in | Wt 164.0 lb

## 2020-02-18 DIAGNOSIS — R0600 Dyspnea, unspecified: Secondary | ICD-10-CM

## 2020-02-18 DIAGNOSIS — J449 Chronic obstructive pulmonary disease, unspecified: Secondary | ICD-10-CM

## 2020-02-18 DIAGNOSIS — R0609 Other forms of dyspnea: Secondary | ICD-10-CM

## 2020-02-18 DIAGNOSIS — D649 Anemia, unspecified: Secondary | ICD-10-CM

## 2020-02-18 LAB — POC HEMOCCULT BLD/STL (OFFICE/1-CARD/DIAGNOSTIC): Fecal Occult Blood, POC: NEGATIVE

## 2020-02-18 NOTE — Progress Notes (Signed)
I,[name],acting as a Education administrator for Leah Greenland, MD.,have documented all relevant documentation on the behalf of Leah Greenland, MD,as directed by  Leah Greenland, MD while in the presence of Leah Greenland, MD.  This visit occurred during the SARS-CoV-2 public health emergency.  Safety protocols were in place, including screening questions prior to the visit, additional usage of staff PPE, and extensive cleaning of exam room while observing appropriate contact time as indicated for disinfecting solutions.  Subjective:     Patient ID: Leah Wiggins , female    DOB: 02/15/1927 , 86 y.o.   MRN: [id number]   Chief Complaint  Patient presents with  . Other    Urgent care f/u    HPI  The patient is here today for a urgent care follow-up.  She was seen at [facility] on 12/7 w/ complaints of shortness of breath on exertion. No chest pain . No cough. Minimal congestion. Fatigue. No leg swelling. No known ill contacts. She has history of OSA, previous tobacco use and COPD.  She has been vaccinated against COVID.       Past Medical History:  Diagnosis Date  . Arthritis    "shoulders" (05/04/2017)  . Breast cancer, right breast ([place]) 1991  . GERD (gastroesophageal reflux disease)   . Hard of hearing   . Hyperlipidemia   . Hypertension   . Memory loss   . Non-stress test nonreactive, 04/24/12, normal 12/10/2012  . OSA on CPAP    "don't wear it all the time" (05/04/2017)  . PVD (peripheral vascular disease) ([place])   . Tobacco use 12/10/2012  . Type II diabetes mellitus ([facility])      Family History  Problem Relation Age of Onset  . Heart disease Mother   . Stroke Mother   . Stroke Father   . Cancer Sister        GIST  . Cancer Brother        thyroid   . Diabetes Son   . Stroke Son      Current Outpatient Medications:  .  alendronate (FOSAMAX) 70 MG tablet, TAKE 1 TABLET EVERY WEDNESDAY WITH A FULL GLASS OF WATER ON AN EMPTY STOMACH, Disp: 12 tablet, Rfl: 3 .   aspirin EC 81 MG tablet, Take 81 mg by mouth daily., Disp: , Rfl:  .  Blood Glucose Monitoring Suppl (FREESTYLE FREEDOM LITE) w/Device KIT, Use to check blood sugar 3 times a day. Dx code e11.65, Disp: 1 kit, Rfl: 3 .  clotrimazole-betamethasone (LOTRISONE) cream, Apply to affected area 2 times daily prn, Disp: 30 g, Rfl: 0 .  ferrous sulfate 325 (65 FE) MG EC tablet, Take 1 tablet (325 mg total) by mouth 3 (three) times daily with meals., Disp: 90 tablet, Rfl: 2 .  gabapentin (NEURONTIN) 100 MG capsule, One capsule po nightly, Disp: 90 capsule, Rfl: 2 .  metFORMIN (GLUCOPHAGE-XR) 500 MG 24 hr tablet, TAKE 1 TABLET TWICE A DAY, Disp: 180 tablet, Rfl: 1 .  MICARDIS 40 MG tablet, TAKE 1 TABLET DAILY, Disp: 90 tablet, Rfl: 3 .  mometasone (ELOCON) 0.1 % lotion, Apply 1 application topically daily as needed (IRRITATION). , Disp: , Rfl: 0 .  mupirocin ointment (BACTROBAN) 2 %, 1 application as needed. , Disp: , Rfl: 0 .  omeprazole (PRILOSEC) 20 MG capsule, Take 1 capsule (20 mg total) by mouth daily., Disp: 90 capsule, Rfl: 0 .  pravastatin (PRAVACHOL) 40 MG tablet, Take 1 tablet (40 mg total)  by mouth daily., Disp: 90 tablet, Rfl: 3 .  Spacer/Aero-Holding Chambers (AEROCHAMBER PLUS) inhaler, Use as instructed, Disp: 1 each, Rfl: 2 .  tiotropium (SPIRIVA) 18 MCG inhalation capsule, Place 1 capsule (18 mcg total) into inhaler and inhale daily., Disp: 90 capsule, Rfl: 2 .  EPINEPHrine 0.3 mg/0.3 mL IJ SOAJ injection, Inject 0.3 mLs (0.3 mg total) into the muscle once as needed (prn worsening angioedema). (Patient not taking: No sig reported), Disp: 1 Device, Rfl: 0 .  glucose blood (FREESTYLE LITE) test strip, Use to check blood sugar 3 times a day. Dx code e11.65, Disp: 150 each, Rfl: 3 .  memantine (NAMENDA) 10 MG tablet, Take 1 tablet (10 mg total) by mouth 2 (two) times daily. (Patient not taking: No sig reported), Disp: 60 tablet, Rfl: 11   Allergies  Allergen Reactions  . Bactrim  [Sulfamethoxazole-Trimethoprim] Swelling    Marked angioedema requiring hospitalization  . Penicillins Other (See Comments)    Has patient had a PCN reaction causing immediate rash, facial/tongue/throat swelling, SOB or lightheadedness with hypotension: Unk Has patient had a PCN reaction causing severe rash involving mucus membranes or skin necrosis: Unk Has patient had a PCN reaction that required hospitalization: Unk Has patient had a PCN reaction occurring within the last 10 years: No If all of the above answers are "NO", then may proceed with Cephalosporin use.     Review of Systems  Constitutional: Negative.   Respiratory: Negative.   Cardiovascular: Negative.   Gastrointestinal: Negative.   Psychiatric/Behavioral: Negative.   All other systems reviewed and are negative.    Today's Vitals   02/18/20 1502  BP: 116/74  Pulse: 83  Temp: 98.5 F (36.9 C)  TempSrc: Oral  Weight: 164 lb (74.4 kg)  Height: 5' (1.524 m)  PainSc: 0-No pain   Body mass index is 32.03 kg/m.  Wt Readings from Last 3 Encounters:  02/26/20 167 lb 3.2 oz (75.8 kg)  02/18/20 164 lb (74.4 kg)  01/08/20 163 lb 9.6 oz (74.2 kg)   Objective:  Physical Exam Vitals and nursing note reviewed.  Constitutional:      Appearance: Normal appearance. She is obese.  HENT:     Head: Normocephalic and atraumatic.  Cardiovascular:     Rate and Rhythm: Normal rate and regular rhythm.     Heart sounds: Normal heart sounds.  Pulmonary:     Effort: Pulmonary effort is normal.     Comments: Scattered rhonchi, decreased BS at bases Skin:    General: Skin is warm.  Neurological:     General: No focal deficit present.     Mental Status: She is alert and oriented to person, place, and time.       Assessment And Plan:     1. Dyspnea on exertion Comments: Urgent care notes reviewed in detail. Her hb is 9.1, this is likely contributing to her sx. Her sx are likely exacerbated by COPD as well. She will use  albuterol prn.   2. Anemia of unknown etiology Comments: CBC results reviewed. DRE performed, stool heme negative. I will check iron levels today. Advised to start OTC iron once daily. She was also given stool cards to complete at home to bring back next week.   - CBC no Diff - Iron and IBC ([id number]) - Ferritin - POC Hemoccult Bld/Stl (1-Cd Office Dx)  3. Chronic obstructive pulmonary disease, unspecified COPD type ([place]) Comments: Chronic. This may be exacerbating her sx. She will c/w Spiriva. Will consider Breztri at  next visit.    Patient was given opportunity to ask questions. Patient verbalized understanding of the plan and was able to repeat key elements of the plan. All questions were answered to their satisfaction.  Leah Greenland, MD   I, Leah Greenland, MD, have reviewed all documentation for this visit. The documentation on 03/29/20 for the exam, diagnosis, procedures, and orders are all accurate and complete.  THE PATIENT IS ENCOURAGED TO PRACTICE SOCIAL DISTANCING DUE TO THE COVID-19 PANDEMIC.

## 2020-02-19 LAB — CBC
Hematocrit: 29 % — ABNORMAL LOW (ref 34.0–46.6)
Hemoglobin: 9.1 g/dL — ABNORMAL LOW (ref 11.1–15.9)
MCH: 23.2 pg — ABNORMAL LOW (ref 26.6–33.0)
MCHC: 31.4 g/dL — ABNORMAL LOW (ref 31.5–35.7)
MCV: 74 fL — ABNORMAL LOW (ref 79–97)
RBC: 3.93 x10E6/uL (ref 3.77–5.28)
RDW: 17.9 % — ABNORMAL HIGH (ref 11.7–15.4)
WBC: 6.4 10*3/uL (ref 3.4–10.8)

## 2020-02-19 LAB — IRON AND TIBC
Iron Saturation: 7 % — CL (ref 15–55)
Iron: 22 ug/dL — ABNORMAL LOW (ref 27–139)
Total Iron Binding Capacity: 334 ug/dL (ref 250–450)
UIBC: 312 ug/dL (ref 118–369)

## 2020-02-19 LAB — FERRITIN: Ferritin: 18 ng/mL (ref 15–150)

## 2020-02-26 ENCOUNTER — Ambulatory Visit (INDEPENDENT_AMBULATORY_CARE_PROVIDER_SITE_OTHER): Payer: Medicare Other

## 2020-02-26 ENCOUNTER — Other Ambulatory Visit: Payer: Self-pay

## 2020-02-26 ENCOUNTER — Ambulatory Visit: Payer: Medicare Other | Admitting: Internal Medicine

## 2020-02-26 VITALS — BP 118/78 | HR 91 | Temp 97.9°F | Ht 60.4 in | Wt 167.2 lb

## 2020-02-26 DIAGNOSIS — Z0000 Encounter for general adult medical examination without abnormal findings: Secondary | ICD-10-CM | POA: Diagnosis not present

## 2020-02-26 NOTE — Patient Instructions (Signed)
Leah Wiggins , Thank you for taking time to come for your Medicare Wellness Visit. I appreciate your ongoing commitment to your health goals. Please review the following plan we discussed and let me know if I can assist you in the future.   Screening recommendations/referrals: Colonoscopy: not required Mammogram: notrequired Bone Density: completed 02/10/2002 Recommended yearly ophthalmology/optometry visit for glaucoma screening and checkup Recommended yearly dental visit for hygiene and checkup  Vaccinations: Influenza vaccine: decline Pneumococcal vaccine: completed 06/19/2013 Tdap vaccine: due Shingles vaccine: discussed   Covid-19: 01/09/2020, 05/20/2019, 04/29/2019  Advanced directives: Advance directive discussed with you today. Even though you declined this today please call our office should you change your mind and we can give you the proper paperwork for you to fill out.  Conditions/risks identified: none  Next appointment: Follow up in one year for your annual wellness visit    Preventive Care 65 Years and Older, Female Preventive care refers to lifestyle choices and visits with your health care provider that can promote health and wellness. What does preventive care include?  A yearly physical exam. This is also called an annual well check.  Dental exams once or twice a year.  Routine eye exams. Ask your health care provider how often you should have your eyes checked.  Personal lifestyle choices, including:  Daily care of your teeth and gums.  Regular physical activity.  Eating a healthy diet.  Avoiding tobacco and drug use.  Limiting alcohol use.  Practicing safe sex.  Taking low-dose aspirin every day.  Taking vitamin and mineral supplements as recommended by your health care provider. What happens during an annual well check? The services and screenings done by your health care provider during your annual well check will depend on your age, overall  health, lifestyle risk factors, and family history of disease. Counseling  Your health care provider may ask you questions about your:  Alcohol use.  Tobacco use.  Drug use.  Emotional well-being.  Home and relationship well-being.  Sexual activity.  Eating habits.  History of falls.  Memory and ability to understand (cognition).  Work and work Statistician.  Reproductive health. Screening  You may have the following tests or measurements:  Height, weight, and BMI.  Blood pressure.  Lipid and cholesterol levels. These may be checked every 5 years, or more frequently if you are over 9 years old.  Skin check.  Lung cancer screening. You may have this screening every year starting at age 2 if you have a 30-pack-year history of smoking and currently smoke or have quit within the past 15 years.  Fecal occult blood test (FOBT) of the stool. You may have this test every year starting at age 23.  Flexible sigmoidoscopy or colonoscopy. You may have a sigmoidoscopy every 5 years or a colonoscopy every 10 years starting at age 7.  Hepatitis C blood test.  Hepatitis B blood test.  Sexually transmitted disease (STD) testing.  Diabetes screening. This is done by checking your blood sugar (glucose) after you have not eaten for a while (fasting). You may have this done every 1-3 years.  Bone density scan. This is done to screen for osteoporosis. You may have this done starting at age 7.  Mammogram. This may be done every 1-2 years. Talk to your health care provider about how often you should have regular mammograms. Talk with your health care provider about your test results, treatment options, and if necessary, the need for more tests. Vaccines  Your health care  provider may recommend certain vaccines, such as:  Influenza vaccine. This is recommended every year.  Tetanus, diphtheria, and acellular pertussis (Tdap, Td) vaccine. You may need a Td booster every 10  years.  Zoster vaccine. You may need this after age 35.  Pneumococcal 13-valent conjugate (PCV13) vaccine. One dose is recommended after age 74.  Pneumococcal polysaccharide (PPSV23) vaccine. One dose is recommended after age 34. Talk to your health care provider about which screenings and vaccines you need and how often you need them. This information is not intended to replace advice given to you by your health care provider. Make sure you discuss any questions you have with your health care provider. Document Released: 03/20/2015 Document Revised: 11/11/2015 Document Reviewed: 12/23/2014 Elsevier Interactive Patient Education  2017 [employer] Prevention in the Home Falls can cause injuries. They can happen to people of all ages. There are many things you can do to make your home safe and to help prevent falls. What can I do on the outside of my home?  Regularly fix the edges of walkways and driveways and fix any cracks.  Remove anything that might make you trip as you walk through a door, such as a raised step or threshold.  Trim any bushes or trees on the path to your home.  Use bright outdoor lighting.  Clear any walking paths of anything that might make someone trip, such as rocks or tools.  Regularly check to see if handrails are loose or broken. Make sure that both sides of any steps have handrails.  Any raised decks and porches should have guardrails on the edges.  Have any leaves, snow, or ice cleared regularly.  Use sand or salt on walking paths during winter.  Clean up any spills in your garage right away. This includes oil or grease spills. What can I do in the bathroom?  Use night lights.  Install grab bars by the toilet and in the tub and shower. Do not use towel bars as grab bars.  Use non-skid mats or decals in the tub or shower.  If you need to sit down in the shower, use a plastic, non-slip stool.  Keep the floor dry. Clean up any water that  spills on the floor as soon as it happens.  Remove soap buildup in the tub or shower regularly.  Attach bath mats securely with double-sided non-slip rug tape.  Do not have throw rugs and other things on the floor that can make you trip. What can I do in the bedroom?  Use night lights.  Make sure that you have a light by your bed that is easy to reach.  Do not use any sheets or blankets that are too big for your bed. They should not hang down onto the floor.  Have a firm chair that has side arms. You can use this for support while you get dressed.  Do not have throw rugs and other things on the floor that can make you trip. What can I do in the kitchen?  Clean up any spills right away.  Avoid walking on wet floors.  Keep items that you use a lot in easy-to-reach places.  If you need to reach something above you, use a strong step stool that has a grab bar.  Keep electrical cords out of the way.  Do not use floor polish or wax that makes floors slippery. If you must use wax, use non-skid floor wax.  Do not have throw  rugs and other things on the floor that can make you trip. What can I do with my stairs?  Do not leave any items on the stairs.  Make sure that there are handrails on both sides of the stairs and use them. Fix handrails that are broken or loose. Make sure that handrails are as long as the stairways.  Check any carpeting to make sure that it is firmly attached to the stairs. Fix any carpet that is loose or worn.  Avoid having throw rugs at the top or bottom of the stairs. If you do have throw rugs, attach them to the floor with carpet tape.  Make sure that you have a light switch at the top of the stairs and the bottom of the stairs. If you do not have them, ask someone to add them for you. What else can I do to help prevent falls?  Wear shoes that:  Do not have high heels.  Have rubber bottoms.  Are comfortable and fit you well.  Are closed at the  toe. Do not wear sandals.  If you use a stepladder:  Make sure that it is fully opened. Do not climb a closed stepladder.  Make sure that both sides of the stepladder are locked into place.  Ask someone to hold it for you, if possible.  Clearly mark and make sure that you can see:  Any grab bars or handrails.  First and last steps.  Where the edge of each step is.  Use tools that help you move around (mobility aids) if they are needed. These include:  Canes.  Walkers.  Scooters.  Crutches.  Turn on the lights when you go into a dark area. Replace any light bulbs as soon as they burn out.  Set up your furniture so you have a clear path. Avoid moving your furniture around.  If any of your floors are uneven, fix them.  If there are any pets around you, be aware of where they are.  Review your medicines with your doctor. Some medicines can make you feel dizzy. This can increase your chance of falling. Ask your doctor what other things that you can do to help prevent falls. This information is not intended to replace advice given to you by your health care provider. Make sure you discuss any questions you have with your health care provider. Document Released: 12/18/2008 Document Revised: 07/30/2015 Document Reviewed: 03/28/2014 Elsevier Interactive Patient Education  2017 [employer].

## 2020-02-26 NOTE — Progress Notes (Signed)
This visit occurred during the SARS-CoV-2 public health emergency.  Safety protocols were in place, including screening questions prior to the visit, additional usage of staff PPE, and extensive cleaning of exam room while observing appropriate contact time as indicated for disinfecting solutions.  Subjective:   Leah Wiggins is a 86 y.o. female who presents for Medicare Annual (Subsequent) preventive examination.  Review of Systems     Cardiac Risk Factors include: advanced age (>22men, >25 women);diabetes mellitus;dyslipidemia;hypertension;obesity (BMI >30kg/m2);sedentary lifestyle     Objective:    Today's Vitals   02/26/20 1012  BP: 118/78  Pulse: 91  Temp: 97.9 F (36.6 C)  TempSrc: Oral  SpO2: 96%  Weight: 167 lb 3.2 oz (75.8 kg)  Height: 5' 0.4" (1.534 m)   Body mass index is 32.22 kg/m.  Advanced Directives 02/26/2020 02/14/2019 05/05/2018 02/07/2018 02/07/2018 01/22/2018 01/19/2018  Does Patient Have a Medical Advance Directive? No No No No No No No  Would patient like information on creating a medical advance directive? No - Patient declined Yes (MAU/Ambulatory/Procedural Areas - Information given) No - Patient declined Yes (MAU/Ambulatory/Procedural Areas - Information given) No - Patient declined - -    Current Medications (verified) Outpatient Encounter Medications as of 02/26/2020  Medication Sig  . alendronate (FOSAMAX) 70 MG tablet TAKE 1 TABLET EVERY WEDNESDAY WITH A FULL GLASS OF WATER ON AN EMPTY STOMACH  . aspirin EC 81 MG tablet Take 81 mg by mouth daily.  . Blood Glucose Monitoring Suppl (FREESTYLE FREEDOM LITE) w/Device KIT Use to check blood sugar 3 times a day. Dx code e11.65  . clotrimazole-betamethasone (LOTRISONE) cream Apply to affected area 2 times daily prn  . gabapentin (NEURONTIN) 100 MG capsule One capsule po nightly  . glucose blood (FREESTYLE LITE) test strip Use to check blood sugar 3 times a day. Dx code e11.65  . metFORMIN  (GLUCOPHAGE-XR) 500 MG 24 hr tablet TAKE 1 TABLET TWICE A DAY  . MICARDIS 40 MG tablet TAKE 1 TABLET DAILY  . mometasone (ELOCON) 0.1 % lotion Apply 1 application topically daily as needed (IRRITATION).   Marland Kitchen mupirocin ointment (BACTROBAN) 2 % 1 application as needed.   Marland Kitchen omeprazole (PRILOSEC) 20 MG capsule Take 1 capsule (20 mg total) by mouth daily.  . pravastatin (PRAVACHOL) 40 MG tablet Take 1 tablet (40 mg total) by mouth daily.  Marland Kitchen Spacer/Aero-Holding Chambers (AEROCHAMBER PLUS) inhaler Use as instructed  . tiotropium (SPIRIVA) 18 MCG inhalation capsule Place 1 capsule (18 mcg total) into inhaler and inhale daily.  Marland Kitchen EPINEPHrine 0.3 mg/0.3 mL IJ SOAJ injection Inject 0.3 mLs (0.3 mg total) into the muscle once as needed (prn worsening angioedema). (Patient not taking: No sig reported)  . ferrous sulfate 325 (65 FE) MG EC tablet Take 1 tablet (325 mg total) by mouth 3 (three) times daily with meals.  . memantine (NAMENDA) 10 MG tablet Take 1 tablet (10 mg total) by mouth 2 (two) times daily. (Patient not taking: No sig reported)   No facility-administered encounter medications on file as of 02/26/2020.    Allergies (verified) Bactrim [sulfamethoxazole-trimethoprim] and Penicillins   History: Past Medical History:  Diagnosis Date  . Arthritis    "shoulders" (05/04/2017)  . Breast cancer, right breast ([place]) 1991  . GERD (gastroesophageal reflux disease)   . Hard of hearing   . Hyperlipidemia   . Hypertension   . Memory loss   . Non-stress test nonreactive, 04/24/12, normal 12/10/2012  . OSA on CPAP    "don't wear  it all the time" (05/04/2017)  . PVD (peripheral vascular disease) ([place])   . Tobacco use 12/10/2012  . Type II diabetes mellitus ([place])    Past Surgical History:  Procedure Laterality Date  . APPENDECTOMY    . BIOPSY  01/19/2018   Procedure: BIOPSY;  Surgeon: Carol Ada, MD;  Location: [facility];  Service: Endoscopy;;  . BREAST BIOPSY Right 1991  . CARDIOVASCULAR  STRESS TEST  04/24/2012   No significant ST segment change suggestive of ischemia.  Marland Kitchen CATARACT EXTRACTION, BILATERAL Bilateral   . COLONOSCOPY WITH PROPOFOL N/A 01/19/2018   Procedure: COLONOSCOPY WITH PROPOFOL;  Surgeon: Carol Ada, MD;  Location: [facility];  Service: Endoscopy;  Laterality: N/A;  . ESOPHAGOGASTRODUODENOSCOPY (EGD) WITH PROPOFOL N/A 01/19/2018   Procedure: ESOPHAGOGASTRODUODENOSCOPY (EGD) WITH PROPOFOL;  Surgeon: Carol Ada, MD;  Location: [facility];  Service: Endoscopy;  Laterality: N/A;  . JOINT REPLACEMENT    . LOWER EXTREMITY ARTERIAL DOPPLER Bilateral 05/07/2012   Left ABI-demonstrated mild arterial insufficiency. Right CIA 50-69% diameter reduction. Right SFA less than 50% diameter reduction. Left SFA 70-99% diameter reduction. Bilateral Runoff-Posterior tibial arteries appeared occluded.  Marland Kitchen MASTECTOMY PARTIAL / LUMPECTOMY W/ AXILLARY LYMPHADENECTOMY Right 1991   . TOTAL HIP ARTHROPLASTY Right 2011   Family History  Problem Relation Age of Onset  . Heart disease Mother   . Stroke Mother   . Stroke Father   . Cancer Sister        GIST  . Cancer Brother        thyroid   . Diabetes Son   . Stroke Son    Social History   Socioeconomic History  . Marital status: Widowed    Spouse name: Not on file  . Number of children: 8  . Years of education: 8th grade  . Highest education level: Not on file  Occupational History  . Occupation: retired  Tobacco Use  . Smoking status: Former Smoker    Packs/day: 0.25    Years: 70.00    Pack years: 17.50    Types: Cigarettes    Start date: 03/08/1947  . Smokeless tobacco: Never Used  . Tobacco comment: has tried cold [place]  Vaping Use  . Vaping Use: Never used  Substance and Sexual Activity  . Alcohol use: No    Alcohol/week: 0.0 standard drinks  . Drug use: No  . Sexual activity: Not Currently  Other Topics Concern  . Not on file  Social History Narrative   Lives at home with her son.   She had eight  children (two sets of twins).   No daily use of caffeine.   Right-handed.   Social Determinants of Health   Financial Resource Strain: Low Risk   . Difficulty of Paying Living Expenses: Not hard at all  Food Insecurity: No Food Insecurity  . Worried About Charity fundraiser in the Last Year: Never true  . Ran Out of Food in the Last Year: Never true  Transportation Needs: No Transportation Needs  . Lack of Transportation (Medical): No  . Lack of Transportation (Non-Medical): No  Physical Activity: Inactive  . Days of Exercise per Week: 0 days  . Minutes of Exercise per Session: 0 min  Stress: No Stress Concern Present  . Feeling of Stress : Not at all  Social Connections: Not on file    Tobacco Counseling Counseling given: Not Answered Comment: has tried cold [place]   Clinical Intake:  Pre-visit preparation completed: Yes  Pain : No/denies pain  Nutritional Status: BMI > 30  Obese Nutritional Risks: None Diabetes: Yes  How often do you need to have someone help you when you read instructions, pamphlets, or other written materials from your doctor or pharmacy?: 1 - Never  Diabetic? Yes Nutrition Risk Assessment:  Has the patient had any N/V/D within the last 2 months?  No  Does the patient have any non-healing wounds?  No  Has the patient had any unintentional weight loss or weight gain?  No   Diabetes:  Is the patient diabetic?  Yes  If diabetic, was a CBG obtained today?  No  Did the patient bring in their glucometer from home?  No  How often do you monitor your CBG's? daily.   Financial Strains and Diabetes Management:  Are you having any financial strains with the device, your supplies or your medication? No .  Does the patient want to be seen by Chronic Care Management for management of their diabetes?  No  Would the patient like to be referred to a Nutritionist or for Diabetic Management?  No   Diabetic Exams:  Diabetic Eye Exam: Completed  09/16/2019 Diabetic Foot Exam: Completed 11/21/2019  Interpreter Needed?: No  Information entered by :: NAllen LPN   Activities of Daily Living In your present state of health, do you have any difficulty performing the following activities: 02/26/2020 02/18/2020  Hearing? Y N  Comment has hearing aides, but not today -  Vision? Y Y  Comment sometimes gets blurry -  Difficulty concentrating or making decisions? Y N  Comment sometimes -  Walking or climbing stairs? N Y  Dressing or bathing? N N  Doing errands, shopping? Y N  Preparing Food and eating ? N -  Using the Toilet? N -  In the past six months, have you accidently leaked urine? Y -  Comment sometimes -  Do you have problems with loss of bowel control? N -  Managing your Medications? N -  Managing your Finances? N -  Housekeeping or managing your Housekeeping? N -  Some recent data might be hidden    Patient Care Team: Glendale Chard, MD as PCP - General (Internal Medicine)  Indicate any recent Medical Services you may have received from other than [facility] providers in the past year (date may be approximate).     Assessment:   This is a routine wellness examination for Leah Wiggins.  Hearing/Vision screen  Hearing Screening   '125Hz'$  $[place]'250Hz'qNTii$'500Hz'$'1000Hz'$'2000Hz'$'3000Hz'$'4000Hz'$'6000Hz'$'8000Hz'$   Right ear:           Left ear:           Vision Screening Comments: Regular eye exams, Dr. Gershon Crane  Dietary issues and exercise activities discussed: Current Exercise Habits: The patient does not participate in regular exercise at present  Goals    . Patient Stated     02/14/2019, no goals    . Patient Stated     02/26/2020, get well      Depression Screen PHQ 2/9 Scores 02/26/2020 05/20/2019 02/14/2019 02/07/2018 04/15/2013  PHQ - 2 Score 0 0 0 0 0  PHQ- 9 Score - - 0 - -    Fall Risk Fall Risk  02/26/2020 05/20/2019 02/14/2019 10/31/2018 04/06/2018  Falls in the past year? 0 1 0 1 0  Number falls in past yr: - 1 - 0 -  Injury  with Fall? - 1 - 1 -  Risk for fall due to : Impaired balance/gait;Medication side effect -  Medication side effect - -  Follow up Falls evaluation completed;Education provided;Falls prevention discussed - Falls evaluation completed;Education provided;Falls prevention discussed - -    FALL RISK PREVENTION PERTAINING TO THE HOME:  Any stairs in or around the home? Yes  If so, are there any without handrails? No  Home free of loose throw rugs in walkways, pet beds, electrical cords, etc? Yes  Adequate lighting in your home to reduce risk of falls? Yes   ASSISTIVE DEVICES UTILIZED TO PREVENT FALLS:  Life alert? No  Use of a cane, walker or w/c? Yes  Grab bars in the bathroom? Yes  Shower chair or bench in shower? Yes  Elevated toilet seat or a handicapped toilet? Yes   TIMED UP AND GO:  Was the test performed? No .    Gait slow and steady with assistive device  Cognitive Function: MMSE - Mini Mental State Exam 12/23/2019 06/24/2019  Orientation to time 4 5  Orientation to Place 5 5  Registration 3 3  Attention/ Calculation 0 2  Recall 2 0  Language- name 2 objects 2 2  Language- repeat 0 1  Language- follow 3 step command 3 3  Language- read & follow direction 1 1  Write a sentence 1 1  Copy design 0 0  Total score 21 23     6CIT Screen 02/26/2020 02/14/2019 02/07/2018  What Year? 0 points 0 points 0 points  What month? 0 points 0 points 0 points  What time? 3 points 3 points 0 points  Count back from 20 0 points 0 points 0 points  Months in reverse 2 points 4 points 4 points  Repeat phrase 4 points 10 points 2 points  Total Score $RemoveBef'9 17 6    '[id number]$ Immunizations Immunization History  Administered Date(s) Administered  . Influenza Whole 02/16/2010  . PFIZER SARS-COV-2 Vaccination 04/29/2019, 05/20/2019, 01/09/2020  . Pneumococcal-Unspecified 06/19/2013    TDAP status: Due, Education has been provided regarding the importance of this vaccine. Advised may receive this  vaccine at local pharmacy or Health Dept. Aware to provide a copy of the vaccination record if obtained from local pharmacy or Health Dept. Verbalized acceptance and understanding.  Flu Vaccine status: Declined, Education has been provided regarding the importance of this vaccine but patient still declined. Advised may receive this vaccine at local pharmacy or Health Dept. Aware to provide a copy of the vaccination record if obtained from local pharmacy or Health Dept. Verbalized acceptance and understanding.  Pneumococcal vaccine status: Up to date  Covid-19 vaccine status: Completed vaccines  Qualifies for Shingles Vaccine? Yes   Zostavax completed No   Shingrix Completed?: No.    Education has been provided regarding the importance of this vaccine. Patient has been advised to call insurance company to determine out of pocket expense if they have not yet received this vaccine. Advised may also receive vaccine at local pharmacy or Health Dept. Verbalized acceptance and understanding.  Screening Tests Health Maintenance  Topic Date Due  . TETANUS/TDAP  Never done  . INFLUENZA VACCINE  06/04/2020 (Originally 10/06/2019)  . HEMOGLOBIN A1C  07/07/2020  . OPHTHALMOLOGY EXAM  09/15/2020  . FOOT EXAM  11/20/2020  . DEXA SCAN  Completed  . COVID-19 Vaccine  Completed  . PNA vac Low Risk Adult  Completed    Health Maintenance  Health Maintenance Due  Topic Date Due  . TETANUS/TDAP  Never done    Colorectal cancer screening: No longer required.   Mammogram status: No longer  required due to age.  Bone Density status: Completed 02/10/2002.  Lung Cancer Screening: (Low Dose CT Chest recommended if Age 44-80 years, 30 pack-year currently smoking OR have quit w/in 15years.) does not qualify.   Lung Cancer Screening Referral: no  Additional Screening:  Hepatitis C Screening: does not qualify;  Vision Screening: Recommended annual ophthalmology exams for early detection of glaucoma and  other disorders of the eye. Is the patient up to date with their annual eye exam?  Yes  Who is the provider or what is the name of the office in which the patient attends annual eye exams? Dr. Gershon Crane If pt is not established with a provider, would they like to be referred to a provider to establish care? No .   Dental Screening: Recommended annual dental exams for proper oral hygiene  Community Resource Referral / Chronic Care Management: CRR required this visit?  No   CCM required this visit?  No      Plan:     I have personally reviewed and noted the following in the patient's chart:   . Medical and social history . Use of alcohol, tobacco or illicit drugs  . Current medications and supplements . Functional ability and status . Nutritional status . Physical activity . Advanced directives . List of other physicians . Hospitalizations, surgeries, and ER visits in previous 12 months . Vitals . Screenings to include cognitive, depression, and falls . Referrals and appointments  In addition, I have reviewed and discussed with patient certain preventive protocols, quality metrics, and best practice recommendations. A written personalized care plan for preventive services as well as general preventive health recommendations were provided to patient.     Kellie Simmering, LPN   [id number]   Nurse Notes:

## 2020-03-21 LAB — HEMOCCULT GUIAC POC 1CARD (OFFICE)
Card #2 Fecal Occult Blod, POC: NEGATIVE
Card #3 Fecal Occult Blood, POC: NEGATIVE
Fecal Occult Blood, POC: NEGATIVE

## 2020-03-31 ENCOUNTER — Telehealth: Payer: Self-pay

## 2020-03-31 NOTE — Telephone Encounter (Signed)
I called the pt because Dr Baird Cancer wanted to know if the pt is short of breath, is she taking any iron supplements and does she want to see a blood specialist.  Ms. Laurann Montana the pt's daughter also left a message that the pt is congested and coughing green phlem.  I called to notify her that Dr Baird Cancer said the pt needed to take a covid test.

## 2020-04-01 ENCOUNTER — Telehealth: Payer: Self-pay

## 2020-04-01 ENCOUNTER — Other Ambulatory Visit (INDEPENDENT_AMBULATORY_CARE_PROVIDER_SITE_OTHER): Payer: Medicare Other

## 2020-04-01 DIAGNOSIS — D649 Anemia, unspecified: Secondary | ICD-10-CM | POA: Diagnosis not present

## 2020-04-01 NOTE — Telephone Encounter (Signed)
The pt said yes she has been feeling short of breath, yes she has been taking iron supplements and yes to seeing a blood specialist.  The pt was asked how she is doing because her daughter called about the pt being congested. The pt said she feels much better.

## 2020-04-27 ENCOUNTER — Other Ambulatory Visit: Payer: Self-pay | Admitting: Internal Medicine

## 2020-04-28 ENCOUNTER — Other Ambulatory Visit: Payer: Self-pay | Admitting: Internal Medicine

## 2020-05-01 ENCOUNTER — Telehealth: Payer: Self-pay

## 2020-05-01 NOTE — Telephone Encounter (Signed)
The pt said daughter [name] said that the pt is currently using omeprazole generic Prilosec.

## 2020-05-07 ENCOUNTER — Other Ambulatory Visit: Payer: Self-pay | Admitting: Internal Medicine

## 2020-05-11 ENCOUNTER — Ambulatory Visit (INDEPENDENT_AMBULATORY_CARE_PROVIDER_SITE_OTHER): Payer: Medicare Other | Admitting: Internal Medicine

## 2020-05-11 ENCOUNTER — Other Ambulatory Visit: Payer: Self-pay

## 2020-05-11 ENCOUNTER — Encounter: Payer: Self-pay | Admitting: Internal Medicine

## 2020-05-11 VITALS — BP 132/88 | HR 76 | Temp 97.9°F | Ht 60.4 in | Wt 176.2 lb

## 2020-05-11 DIAGNOSIS — I700 Atherosclerosis of aorta: Secondary | ICD-10-CM

## 2020-05-11 DIAGNOSIS — M79672 Pain in left foot: Secondary | ICD-10-CM

## 2020-05-11 DIAGNOSIS — E1122 Type 2 diabetes mellitus with diabetic chronic kidney disease: Secondary | ICD-10-CM | POA: Diagnosis not present

## 2020-05-11 DIAGNOSIS — Z6833 Body mass index (BMI) 33.0-33.9, adult: Secondary | ICD-10-CM

## 2020-05-11 DIAGNOSIS — I129 Hypertensive chronic kidney disease with stage 1 through stage 4 chronic kidney disease, or unspecified chronic kidney disease: Secondary | ICD-10-CM

## 2020-05-11 DIAGNOSIS — I739 Peripheral vascular disease, unspecified: Secondary | ICD-10-CM

## 2020-05-11 DIAGNOSIS — D508 Other iron deficiency anemias: Secondary | ICD-10-CM

## 2020-05-11 DIAGNOSIS — M79671 Pain in right foot: Secondary | ICD-10-CM

## 2020-05-11 DIAGNOSIS — J449 Chronic obstructive pulmonary disease, unspecified: Secondary | ICD-10-CM

## 2020-05-11 DIAGNOSIS — N182 Chronic kidney disease, stage 2 (mild): Secondary | ICD-10-CM | POA: Diagnosis not present

## 2020-05-11 DIAGNOSIS — E6609 Other obesity due to excess calories: Secondary | ICD-10-CM

## 2020-05-11 NOTE — Patient Instructions (Signed)
Diabetes Mellitus and Foot Care Foot care is an important part of your health, especially when you have diabetes. Diabetes may cause you to have problems because of poor blood flow (circulation) to your feet and legs, which can cause your skin to:  Become thinner and drier.  Break more easily.  Heal more slowly.  Peel and crack. You may also have nerve damage (neuropathy) in your legs and feet, causing decreased feeling in them. This means that you may not notice minor injuries to your feet that could lead to more serious problems. Noticing and addressing any potential problems early is the best way to prevent future foot problems. How to care for your feet Foot hygiene  Wash your feet daily with warm water and mild soap. Do not use hot water. Then, pat your feet and the areas between your toes until they are completely dry. Do not soak your feet as this can dry your skin.  Trim your toenails straight across. Do not dig under them or around the cuticle. File the edges of your nails with an emery board or nail file.  Apply a moisturizing lotion or petroleum jelly to the skin on your feet and to dry, brittle toenails. Use lotion that does not contain alcohol and is unscented. Do not apply lotion between your toes.   Shoes and socks  Wear clean socks or stockings every day. Make sure they are not too tight. Do not wear knee-high stockings since they may decrease blood flow to your legs.  Wear shoes that fit properly and have enough cushioning. Always look in your shoes before you put them on to be sure there are no objects inside.  To break in new shoes, wear them for just a few hours a day. This prevents injuries on your feet. Wounds, scrapes, corns, and calluses  Check your feet daily for blisters, cuts, bruises, sores, and redness. If you cannot see the bottom of your feet, use a mirror or ask someone for help.  Do not cut corns or calluses or try to remove them with medicine.  If you  find a minor scrape, cut, or break in the skin on your feet, keep it and the skin around it clean and dry. You may clean these areas with mild soap and water. Do not clean the area with peroxide, alcohol, or iodine.  If you have a wound, scrape, corn, or callus on your foot, look at it several times a day to make sure it is healing and not infected. Check for: ? Redness, swelling, or pain. ? Fluid or blood. ? Warmth. ? Pus or a bad smell.   General tips  Do not cross your legs. This may decrease blood flow to your feet.  Do not use heating pads or hot water bottles on your feet. They may burn your skin. If you have lost feeling in your feet or legs, you may not know this is happening until it is too late.  Protect your feet from hot and cold by wearing shoes, such as at the beach or on hot pavement.  Schedule a complete foot exam at least once a year (annually) or more often if you have foot problems. Report any cuts, sores, or bruises to your health care provider immediately. Where to find more information  American Diabetes Association: www.diabetes.org  Association of Diabetes Care & Education Specialists: www.diabeteseducator.org Contact a health care provider if:  You have a medical condition that increases your risk of infection and   you have any cuts, sores, or bruises on your feet.  You have an injury that is not healing.  You have redness on your legs or feet.  You feel burning or tingling in your legs or feet.  You have pain or cramps in your legs and feet.  Your legs or feet are numb.  Your feet always feel cold.  You have pain around any toenails. Get help right away if:  You have a wound, scrape, corn, or callus on your foot and: ? You have pain, swelling, or redness that gets worse. ? You have fluid or blood coming from the wound, scrape, corn, or callus. ? Your wound, scrape, corn, or callus feels warm to the touch. ? You have pus or a bad smell coming from  the wound, scrape, corn, or callus. ? You have a fever. ? You have a red line going up your leg. Summary  Check your feet every day for blisters, cuts, bruises, sores, and redness.  Apply a moisturizing lotion or petroleum jelly to the skin on your feet and to dry, brittle toenails.  Wear shoes that fit properly and have enough cushioning.  If you have foot problems, report any cuts, sores, or bruises to your health care provider immediately.  Schedule a complete foot exam at least once a year (annually) or more often if you have foot problems. This information is not intended to replace advice given to you by your health care provider. Make sure you discuss any questions you have with your health care provider. Document Revised: 09/12/2019 Document Reviewed: 09/12/2019 Elsevier Patient Education  2021 Elsevier Inc.  

## 2020-05-11 NOTE — Progress Notes (Signed)
I,Leah Wiggins,acting as a Education administrator for Leah Greenland, MD.,have documented all relevant documentation on the behalf of Leah Greenland, MD,as directed by  Leah Greenland, MD while in the presence of Leah Greenland, MD.  This visit occurred during the SARS-CoV-2 public health emergency.  Safety protocols were in place, including screening questions prior to the visit, additional usage of staff PPE, and extensive cleaning of exam room while observing appropriate contact time as indicated for disinfecting solutions.  Subjective:     Patient ID: Leah Wiggins , female    DOB: January 13, 1927 , 87 y.o.   MRN: [id number]   Chief Complaint  Patient presents with  . Diabetes  . Hypertension    HPI  The patient is here today for a diabetes and blood pressure follow-up.  Diabetes She presents for her follow-up diabetic visit. She has type 2 diabetes mellitus. There are no hypoglycemic associated symptoms. Pertinent negatives for diabetes include no blurred vision. There are no hypoglycemic complications. Risk factors for coronary artery disease include diabetes mellitus, dyslipidemia, hypertension, obesity, sedentary lifestyle and post-menopausal.  Hypertension This is a chronic problem. The current episode started more than 1 year ago. The problem has been gradually improving since onset. The problem is controlled. Pertinent negatives include no blurred vision. The current treatment provides moderate improvement.     Past Medical History:  Diagnosis Date  . Arthritis    "shoulders" (05/04/2017)  . Breast cancer, right breast ([place]) 1991  . GERD (gastroesophageal reflux disease)   . Hard of hearing   . Hyperlipidemia   . Hypertension   . Memory loss   . Non-stress test nonreactive, 04/24/12, normal 12/10/2012  . OSA on CPAP    "don't wear it all the time" (05/04/2017)  . PVD (peripheral vascular disease) ([place])   . Tobacco use 12/10/2012  . Type II diabetes mellitus (HCC)      Family  History  Problem Relation Age of Onset  . Heart disease Mother   . Stroke Mother   . Stroke Father   . Cancer Sister        GIST  . Cancer Brother        thyroid   . Diabetes Son   . Stroke Son      Current Outpatient Medications:  .  alendronate (FOSAMAX) 70 MG tablet, TAKE 1 TABLET EVERY WEDNESDAY WITH A FULL GLASS OF WATER ON AN EMPTY STOMACH, Disp: 12 tablet, Rfl: 3 .  aspirin EC 81 MG tablet, Take 81 mg by mouth daily., Disp: , Rfl:  .  Blood Glucose Monitoring Suppl (FREESTYLE FREEDOM LITE) w/Device KIT, Use to check blood sugar 3 times a day. Dx code e11.65, Disp: 1 kit, Rfl: 3 .  clotrimazole-betamethasone (LOTRISONE) cream, Apply to affected area 2 times daily prn, Disp: 30 g, Rfl: 0 .  ferrous sulfate 325 (65 FE) MG EC tablet, Take 1 tablet (325 mg total) by mouth 3 (three) times daily with meals. (Patient taking differently: Take 325 mg by mouth 3 (three) times daily with meals. 1 daily), Disp: 90 tablet, Rfl: 2 .  gabapentin (NEURONTIN) 100 MG capsule, One capsule po nightly, Disp: 90 capsule, Rfl: 2 .  glucose blood (FREESTYLE LITE) test strip, Use to check blood sugar 3 times a day. Dx code e11.65, Disp: 150 each, Rfl: 3 .  metFORMIN (GLUCOPHAGE-XR) 500 MG 24 hr tablet, TAKE 1 TABLET TWICE A DAY, Disp: 180 tablet, Rfl: 3 .  MICARDIS 40 MG tablet, TAKE 1  TABLET DAILY, Disp: 90 tablet, Rfl: 3 .  mometasone (ELOCON) 0.1 % lotion, Apply 1 application topically daily as needed (IRRITATION). , Disp: , Rfl: 0 .  mupirocin ointment (BACTROBAN) 2 %, 1 application as needed. , Disp: , Rfl: 0 .  pantoprazole (PROTONIX) 40 MG tablet, TAKE 1 TABLET DAILY AT 12 NOON, Disp: 90 tablet, Rfl: 3 .  pravastatin (PRAVACHOL) 40 MG tablet, TAKE 1 TABLET DAILY, Disp: 90 tablet, Rfl: 3 .  Spacer/Aero-Holding Chambers (AEROCHAMBER PLUS) inhaler, Use as instructed, Disp: 1 each, Rfl: 2 .  tiotropium (SPIRIVA) 18 MCG inhalation capsule, Place 1 capsule (18 mcg total) into inhaler and inhale daily.,  Disp: 90 capsule, Rfl: 2 .  EPINEPHrine 0.3 mg/0.3 mL IJ SOAJ injection, Inject 0.3 mLs (0.3 mg total) into the muscle once as needed (prn worsening angioedema). (Patient not taking: No sig reported), Disp: 1 Device, Rfl: 0 .  memantine (NAMENDA) 10 MG tablet, Take 1 tablet (10 mg total) by mouth 2 (two) times daily. (Patient not taking: No sig reported), Disp: 60 tablet, Rfl: 11 .  omeprazole (PRILOSEC) 20 MG capsule, TAKE 1 CAPSULE DAILY, Disp: 90 capsule, Rfl: 3   Allergies  Allergen Reactions  . Bactrim [Sulfamethoxazole-Trimethoprim] Swelling    Marked angioedema requiring hospitalization  . Penicillins Other (See Comments)    Has patient had a PCN reaction causing immediate rash, facial/tongue/throat swelling, SOB or lightheadedness with hypotension: Unk Has patient had a PCN reaction causing severe rash involving mucus membranes or skin necrosis: Unk Has patient had a PCN reaction that required hospitalization: Unk Has patient had a PCN reaction occurring within the last 10 years: No If all of the above answers are "NO", then may proceed with Cephalosporin use.     Review of Systems  Constitutional: Negative.   Eyes: Negative for blurred vision.  Respiratory: Negative.   Cardiovascular: Positive for leg swelling.       Bilateral foot swelling   Gastrointestinal: Negative.   Musculoskeletal: Positive for arthralgias.       She c/o b/l foot pain. Has been evaluated by Dr. Aljouny in the past, not currently pleased w/ her care.   Psychiatric/Behavioral: Negative.   All other systems reviewed and are negative.    Today's Vitals   05/11/20 1439  BP: 132/88  Pulse: 76  Temp: 97.9 F (36.6 C)  TempSrc: Oral  Weight: 176 lb 3.2 oz (79.9 kg)  Height: 5' 0.4" (1.534 m)  PainSc: 6   PainLoc: Head   Body mass index is 33.96 kg/m.  Wt Readings from Last 3 Encounters:  05/11/20 176 lb 3.2 oz (79.9 kg)  02/26/20 167 lb 3.2 oz (75.8 kg)  02/18/20 164 lb (74.4 kg)   Objective:   Physical Exam Vitals and nursing note reviewed.  Constitutional:      Appearance: Normal appearance. She is obese.  HENT:     Head: Normocephalic and atraumatic.  Cardiovascular:     Rate and Rhythm: Normal rate and regular rhythm.     Pulses:          Dorsalis pedis pulses are 1+ on the right side and 1+ on the left side.     Heart sounds: Normal heart sounds.  Pulmonary:     Breath sounds: Normal breath sounds.  Musculoskeletal:     Cervical back: Normal range of motion.  Feet:     Right foot:     Protective Sensation: 5 sites tested. 5 sites sensed.     Skin integrity: Callus   and dry skin present.     Toenail Condition: Right toenails are long.     Left foot:     Protective Sensation: 5 sites tested. 5 sites sensed.     Skin integrity: Callus and dry skin present.     Toenail Condition: Left toenails are abnormally thick and long.  Skin:    General: Skin is warm.  Neurological:     General: No focal deficit present.     Mental Status: She is alert and oriented to person, place, and time.         Assessment And Plan:     1. Type 2 diabetes mellitus with stage 2 chronic kidney disease, without long-term current use of insulin (HCC) Comments: Chronic, she has done well with only metformin.  She agrees to Podiatry referral for diabetic foot evaluation.  - Hemoglobin A1c - CMP14+EGFR - Ambulatory referral to Podiatry  2. Hypertensive nephropathy Comments: Chronic, controlled. Advised to limit her salt intake. Advised to use salt free seasonings.   3. Chronic obstructive pulmonary disease, unspecified COPD type (HCC) Comments: Chronic, now followed by Pulmonary. Importance of smoking cessation was d/w pt. Advised to use inhalers as prescribed.   4. Atherosclerosis of aorta (HCC) Comments: Chronic. Advised to live heart healthy lifestyle, avoid fried foods and take statin as directed. She wants to f/u with her cardiologist, Dr. Berry.   5. Other iron deficiency  anemia Comments: I will check CBC and fe/tibc today.  - CBC no Diff - Iron and IBC (CPT-83540,[id number])  6. Foot pain, bilateral Comments: I will refer her to Podiatry for further evaluation. She and her daughter are no longer pleased with previous podiatrist.  - Uric acid  7. Peripheral artery disease (HCC) Comments: Chronic. Encouraged to comply with statin therapy, increase activity and live heart healthy lifestyle.  - Ambulatory referral to Cardiology  8. Class 1 obesity due to excess calories with serious comorbidity and body mass index (BMI) of 33.0 to 33.9 in adult  She is encouraged to strive for BMI less than 30 to decrease cardiac risk. Advised to aim for at least 150 minutes of exercise per week.   Patient was given opportunity to ask questions. Patient verbalized understanding of the plan and was able to repeat key elements of the plan. All questions were answered to their satisfaction.   I, Robyn N Sanders, MD, have reviewed all documentation for this visit. The documentation on 05/17/20 for the exam, diagnosis, procedures, and orders are all accurate and complete.  THE PATIENT IS ENCOURAGED TO PRACTICE SOCIAL DISTANCING DUE TO THE COVID-19 PANDEMIC.   

## 2020-05-12 LAB — CMP14+EGFR
ALT: 17 IU/L (ref 0–32)
AST: 22 IU/L (ref 0–40)
Albumin/Globulin Ratio: 1.6 (ref 1.2–2.2)
Albumin: 3.9 g/dL (ref 3.5–4.6)
Alkaline Phosphatase: 75 IU/L (ref 44–121)
BUN/Creatinine Ratio: 16 (ref 12–28)
BUN: 16 mg/dL (ref 10–36)
Bilirubin Total: <0.2 mg/dL (ref 0.0–1.2)
CO2: 21 mmol/L (ref 20–29)
Calcium: 9.5 mg/dL (ref 8.7–10.3)
Chloride: 107 mmol/L — ABNORMAL HIGH (ref 96–106)
Creatinine, Ser: 0.98 mg/dL (ref 0.57–1.00)
Globulin, Total: 2.5 g/dL (ref 1.5–4.5)
Glucose: 80 mg/dL (ref 65–99)
Potassium: 4.2 mmol/L (ref 3.5–5.2)
Sodium: 144 mmol/L (ref 134–144)
Total Protein: 6.4 g/dL (ref 6.0–8.5)
eGFR: 54 mL/min/{1.73_m2} — ABNORMAL LOW (ref >59)

## 2020-05-12 LAB — CBC
Hematocrit: 30.7 % — ABNORMAL LOW (ref 34.0–46.6)
Hemoglobin: 9.4 g/dL — ABNORMAL LOW (ref 11.1–15.9)
MCH: 23.9 pg — ABNORMAL LOW (ref 26.6–33.0)
MCHC: 30.6 g/dL — ABNORMAL LOW (ref 31.5–35.7)
MCV: 78 fL — ABNORMAL LOW (ref 79–97)
RBC: 3.93 x10E6/uL (ref 3.77–5.28)
RDW: 21.2 % — ABNORMAL HIGH (ref 11.7–15.4)
WBC: 6.5 10*3/uL (ref 3.4–10.8)

## 2020-05-12 LAB — HEMOGLOBIN A1C
Est. average glucose Bld gHb Est-mCnc: 105 mg/dL
Hgb A1c MFr Bld: 5.3 % (ref 4.8–5.6)

## 2020-05-12 LAB — URIC ACID: Uric Acid: 5.6 mg/dL (ref 3.1–7.9)

## 2020-05-12 LAB — IRON AND TIBC
Iron Saturation: 46 % (ref 15–55)
Iron: 142 ug/dL — ABNORMAL HIGH (ref 27–139)
Total Iron Binding Capacity: 307 ug/dL (ref 250–450)
UIBC: 165 ug/dL (ref 118–369)

## 2020-05-20 ENCOUNTER — Encounter: Payer: Medicare Other | Admitting: Internal Medicine

## 2020-06-04 ENCOUNTER — Ambulatory Visit (INDEPENDENT_AMBULATORY_CARE_PROVIDER_SITE_OTHER): Payer: Medicare Other | Admitting: Sports Medicine

## 2020-06-04 ENCOUNTER — Encounter: Payer: Self-pay | Admitting: Sports Medicine

## 2020-06-04 ENCOUNTER — Other Ambulatory Visit: Payer: Self-pay

## 2020-06-04 DIAGNOSIS — K648 Other hemorrhoids: Secondary | ICD-10-CM | POA: Insufficient documentation

## 2020-06-04 DIAGNOSIS — E119 Type 2 diabetes mellitus without complications: Secondary | ICD-10-CM | POA: Diagnosis not present

## 2020-06-04 DIAGNOSIS — Z1211 Encounter for screening for malignant neoplasm of colon: Secondary | ICD-10-CM | POA: Insufficient documentation

## 2020-06-04 DIAGNOSIS — K625 Hemorrhage of anus and rectum: Secondary | ICD-10-CM | POA: Insufficient documentation

## 2020-06-04 DIAGNOSIS — Z0000 Encounter for general adult medical examination without abnormal findings: Secondary | ICD-10-CM | POA: Insufficient documentation

## 2020-06-04 DIAGNOSIS — H90A21 Sensorineural hearing loss, unilateral, right ear, with restricted hearing on the contralateral side: Secondary | ICD-10-CM | POA: Insufficient documentation

## 2020-06-04 DIAGNOSIS — I739 Peripheral vascular disease, unspecified: Secondary | ICD-10-CM

## 2020-06-04 DIAGNOSIS — K5520 Angiodysplasia of colon without hemorrhage: Secondary | ICD-10-CM | POA: Insufficient documentation

## 2020-06-04 DIAGNOSIS — M79609 Pain in unspecified limb: Secondary | ICD-10-CM | POA: Diagnosis not present

## 2020-06-04 DIAGNOSIS — L853 Xerosis cutis: Secondary | ICD-10-CM

## 2020-06-04 DIAGNOSIS — R634 Abnormal weight loss: Secondary | ICD-10-CM | POA: Insufficient documentation

## 2020-06-04 DIAGNOSIS — R195 Other fecal abnormalities: Secondary | ICD-10-CM | POA: Insufficient documentation

## 2020-06-04 DIAGNOSIS — B351 Tinea unguium: Secondary | ICD-10-CM | POA: Diagnosis not present

## 2020-06-04 DIAGNOSIS — K219 Gastro-esophageal reflux disease without esophagitis: Secondary | ICD-10-CM | POA: Insufficient documentation

## 2020-06-04 DIAGNOSIS — H90A32 Mixed conductive and sensorineural hearing loss, unilateral, left ear with restricted hearing on the contralateral side: Secondary | ICD-10-CM | POA: Insufficient documentation

## 2020-06-04 NOTE — Progress Notes (Signed)
Subjective: Leah Wiggins is a 87 y.o. female patient with history of diabetes who presents to office today complaining of long,mildly painful nails  while ambulating in shoes; unable to trim. Patient states that the glucose reading this morning was not recorded but yesterday was 135, last A1c 5.3 and last visit to PCP was around March 14th. Patient denies any new changes in medication or new problems.  Review of Systems  All other systems reviewed and are negative.    Patient Active Problem List   Diagnosis Date Noted  . Abnormal feces 06/04/2020  . Abnormal weight loss 06/04/2020  . Angiodysplasia of intestine 06/04/2020  . Colon cancer screening 06/04/2020  . Gastroesophageal reflux disease 06/04/2020  . 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 hearing of right ear 06/04/2020  . Prolapsed internal hemorrhoids 06/04/2020  . Rectal bleeding 06/04/2020  . Sensorineural hearing loss (SNHL) of right ear with restricted hearing of left ear 06/04/2020  . Mild cognitive impairment 12/23/2019  . Memory loss 06/24/2019  . Intrinsic atopic dermatitis 09/18/2018  . Chronic obstructive pulmonary disease ([place]) 04/06/2018  . Prediabetes 02/07/2018  . Iron deficiency anemia due to chronic blood loss 12/25/2017  . Anaphylaxis 05/04/2017  . Rash and nonspecific skin eruption 05/04/2017  . History of breast cancer 08/14/2014  . Empty sella ([place]) 07/14/2014  . Pain in the chest 04/28/2014  . Diabetes mellitus without complication ([place]) 72/62/0355  . Chest pain 03/03/2014  . Essential hypertension 06/24/2013  . PAD (peripheral artery disease), abnormal ABIs 12/10/2012  . Non-stress test nonreactive, 04/24/12, normal 12/10/2012  . Dyslipidemia 12/10/2012  . Tobacco use 12/10/2012  . Thrombocytopenia ([place]) 01/24/2012  . RESTLESS LEG SYNDROME 02/16/2010  . METHICILLIN SUSECPTIBLE PNEUMONIA STAPH AUREUS 01/13/2010  . HIP THI LEG&ANK ABRASION/FRICION BURN W/O  INF 01/13/2010   Current Outpatient Medications on File Prior to Visit  Medication Sig Dispense Refill  . alendronate (FOSAMAX) 70 MG tablet TAKE 1 TABLET EVERY WEDNESDAY WITH A FULL GLASS OF WATER ON AN EMPTY STOMACH 12 tablet 3  . aspirin EC 81 MG tablet Take 81 mg by mouth daily.    . betamethasone dipropionate (DIPROLENE) 0.05 % ointment     . betamethasone valerate ointment (VALISONE) 0.1 %     . Blood Glucose Monitoring Suppl (FREESTYLE FREEDOM LITE) w/Device KIT Use to check blood sugar 3 times a day. Dx code e11.65 1 kit 3  . clotrimazole-betamethasone (LOTRISONE) cream Apply to affected area 2 times daily prn 30 g 0  . gabapentin (NEURONTIN) 100 MG capsule One capsule po nightly 90 capsule 2  . glucose blood (FREESTYLE LITE) test strip Use to check blood sugar 3 times a day. Dx code e11.65 150 each 3  . hydrocortisone 2.5 % cream     . memantine (NAMENDA) 10 MG tablet Take 1 tablet (10 mg total) by mouth 2 (two) times daily. 60 tablet 11  . metFORMIN (GLUCOPHAGE-XR) 500 MG 24 hr tablet TAKE 1 TABLET TWICE A DAY 180 tablet 3  . MICARDIS 40 MG tablet TAKE 1 TABLET DAILY 90 tablet 3  . mometasone (ELOCON) 0.1 % lotion Apply 1 application topically daily as needed (IRRITATION).   0  . mupirocin ointment (BACTROBAN) 2 % 1 application as needed.   0  . pantoprazole (PROTONIX) 40 MG tablet TAKE 1 TABLET DAILY AT 12 NOON 90 tablet 3  . pravastatin (PRAVACHOL) 40 MG tablet TAKE 1 TABLET DAILY 90 tablet 3  . Spacer/Aero-Holding Chambers (AEROCHAMBER PLUS)  inhaler Use as instructed 1 each 2  . tiotropium (SPIRIVA) 18 MCG inhalation capsule Place 1 capsule (18 mcg total) into inhaler and inhale daily. 90 capsule 2  . triamcinolone (KENALOG) 0.1 %     . ferrous sulfate 325 (65 FE) MG EC tablet Take 1 tablet (325 mg total) by mouth 3 (three) times daily with meals. (Patient taking differently: Take 325 mg by mouth 3 (three) times daily with meals. 1 daily) 90 tablet 2   No current  facility-administered medications on file prior to visit.   Allergies  Allergen Reactions  . Bactrim [Sulfamethoxazole-Trimethoprim] Swelling    Marked angioedema requiring hospitalization  . Penicillins Other (See Comments)    Has patient had a PCN reaction causing immediate rash, facial/tongue/throat swelling, SOB or lightheadedness with hypotension: Unk Has patient had a PCN reaction causing severe rash involving mucus membranes or skin necrosis: Unk Has patient had a PCN reaction that required hospitalization: Unk Has patient had a PCN reaction occurring within the last 10 years: No If all of the above answers are "NO", then may proceed with Cephalosporin use.    Recent Results (from the past 2160 hour(s))  POCT occult blood stool     Status: Normal   Collection Time: 03/21/20  1:28 PM  Result Value Ref Range   Fecal Occult Blood, POC Negative Negative    Comment: the pt didn't provide a date on her stool cards as instructed.    Card #1 Date no date provided    Card #2 Fecal Occult Blod, POC Negative    Card #2 Date no date provided    Card #3 Fecal Occult Blood, POC Negative    Card #3 Date no date provided   Hemoglobin A1c     Status: None   Collection Time: 05/11/20  3:59 PM  Result Value Ref Range   Hgb A1c MFr Bld 5.3 4.8 - 5.6 %    Comment:          Prediabetes: 5.7 - 6.4          Diabetes: >6.4          Glycemic control for adults with diabetes: <7.0    Est. average glucose Bld gHb Est-mCnc 105 mg/dL  CBC no Diff     Status: Abnormal   Collection Time: 05/11/20  3:59 PM  Result Value Ref Range   WBC 6.5 3.4 - 10.8 x10E3/uL   RBC 3.93 3.77 - 5.28 x10E6/uL    Comment: Polychromasia present Few schistocytes.    Hemoglobin 9.4 (L) 11.1 - 15.9 g/dL   Hematocrit 30.7 (L) 34.0 - 46.6 %   MCV 78 (L) 79 - 97 fL   MCH 23.9 (L) 26.6 - 33.0 pg   MCHC 30.6 (L) 31.5 - 35.7 g/dL   RDW 21.2 (H) 11.7 - 15.4 %   Platelets CANCELED x10E3/uL    Comment: Unable to perform an  accurate platelet count due to aggregation of the platelets.  Result canceled by the ancillary.    Hematology Comments: Note:     Comment: Verified by microscopic examination.  CMP14+EGFR     Status: Abnormal   Collection Time: 05/11/20  3:59 PM  Result Value Ref Range   Glucose 80 65 - 99 mg/dL   BUN 16 10 - 36 mg/dL   Creatinine, Ser 0.98 0.57 - 1.00 mg/dL   eGFR 54 (L) >59 mL/min/1.73   BUN/Creatinine Ratio 16 12 - 28   Sodium 144 134 - 144 mmol/L  Potassium 4.2 3.5 - 5.2 mmol/L   Chloride 107 (H) 96 - 106 mmol/L   CO2 21 20 - 29 mmol/L   Calcium 9.5 8.7 - 10.3 mg/dL   Total Protein 6.4 6.0 - 8.5 g/dL   Albumin 3.9 3.5 - 4.6 g/dL   Globulin, Total 2.5 1.5 - 4.5 g/dL   Albumin/Globulin Ratio 1.6 1.2 - 2.2   Bilirubin Total <0.2 0.0 - 1.2 mg/dL   Alkaline Phosphatase 75 44 - 121 IU/L   AST 22 0 - 40 IU/L   ALT 17 0 - 32 IU/L  Iron and IBC ([id number])     Status: Abnormal   Collection Time: 05/11/20  3:59 PM  Result Value Ref Range   Total Iron Binding Capacity 307 250 - 450 ug/dL   UIBC 165 118 - 369 ug/dL   Iron 142 (H) 27 - 139 ug/dL   Iron Saturation 46 15 - 55 %  Uric acid     Status: None   Collection Time: 05/11/20  3:59 PM  Result Value Ref Range   Uric Acid 5.6 3.1 - 7.9 mg/dL    Comment:            Therapeutic target for gout patients: <6.0    Objective: General: Patient is awake, alert, and oriented x 3 and in no acute distress.  Integument: Skin is warm, dry and supple bilateral. Nails are tender, long, thickened and  dystrophic with subungual debris, consistent with onychomycosis, 1-5 bilateral. No signs of infection. No open lesions or preulcerative lesions present bilateral. Remaining integument unremarkable.  Vasculature:  Dorsalis Pedis pulse 1/4 bilateral. Posterior Tibial pulse  0/4 bilateral.  Capillary fill time <3 sec 1-5 bilateral. Scant hair growth to the level of the digits. Temperature gradient within normal limits. No varicosities  present bilateral. No edema present bilateral.   Neurology: The patient has diminished sensation measured with a 5.07/10g Semmes Weinstein Monofilament at all pedal sites bilateral . Vibratory sensation diminished bilateral with tuning fork. No Babinski sign present bilateral.   Musculoskeletal: Asymptomatic bunion, hammertoes, pes planus pedal deformities noted bilateral. Muscular strength 4/5 in all lower extremity muscular groups bilateral without pain on range of motion. No tenderness with calf compression bilateral.  Assessment and Plan: Problem List Items Addressed This Visit   None   Visit Diagnoses    Pain due to onychomycosis of nail    -  Primary   PVD (peripheral vascular disease) ([place])       Dry skin       Encounter for comprehensive diabetic foot examination, type 2 diabetes mellitus ([place])          -Examined patient. -Discussed and educated patient on diabetic foot care, especially with  regards to the vascular, neurological and musculoskeletal systems.  -Stressed the importance of good glycemic control and the detriment of not  controlling glucose levels in relation to the foot. -Mechanically debrided all nails 1-5 bilateral using sterile nail nipper and filed with dremel without incident  -Answered all patient questions -Patient to return  in 3 months for at risk foot care -Patient advised to call the office if any problems or questions arise in the meantime.  Landis Martins, DPM

## 2020-06-15 ENCOUNTER — Telehealth: Payer: Self-pay

## 2020-06-15 ENCOUNTER — Encounter: Payer: Self-pay | Admitting: Internal Medicine

## 2020-06-15 NOTE — Telephone Encounter (Signed)
Left the pt's daughter Leah Wiggins a message that the [facility] forms are ready for pickup.

## 2020-07-15 ENCOUNTER — Ambulatory Visit (INDEPENDENT_AMBULATORY_CARE_PROVIDER_SITE_OTHER): Payer: Medicare Other | Admitting: Cardiovascular Disease

## 2020-07-15 ENCOUNTER — Encounter: Payer: Self-pay | Admitting: Cardiovascular Disease

## 2020-07-15 ENCOUNTER — Other Ambulatory Visit: Payer: Self-pay

## 2020-07-15 VITALS — BP 104/52 | HR 74 | Ht 60.0 in | Wt 169.0 lb

## 2020-07-15 DIAGNOSIS — I10 Essential (primary) hypertension: Secondary | ICD-10-CM | POA: Diagnosis not present

## 2020-07-15 DIAGNOSIS — I739 Peripheral vascular disease, unspecified: Secondary | ICD-10-CM

## 2020-07-15 DIAGNOSIS — E785 Hyperlipidemia, unspecified: Secondary | ICD-10-CM | POA: Diagnosis not present

## 2020-07-15 NOTE — Progress Notes (Signed)
07/15/2020 Leah Wiggins   1927-01-31  [id number]  Primary Physician Glendale Chard, MD Primary Cardiologist: Lorretta Harp MD FACP, [place], [place], [place]  HPI:  Leah Wiggins is a 87 y.o.  mildly overweight widowed [place] American female, mother of 34, grandmother of greater than 32 grandchildren. She is accompanied by her daughter [name] today. I last saw her in the office8/25/2020.Marland Kitchen Her risk factors include recently discontinued tobacco abuse having smoked for many years and currently wearing a nicotine patch., contrary, hypertension, mild hyperlipidemia, and non-insulin-requiring diabetes. She also has peripheral vascular occlusive disease with Dopplers that show a right ABI of .64 with a high-frequency signal in the origin of the right common iliac artery I saw this progression of disease in the distal right SFA., left ABI of 0.84 with a high-frequency signal in the origin of the left SFA, though she really denies claudication. She had a negative Myoview performed 04/24/12 and more recently 04/29/14 for atypical chest pain.  Since I saw her in the office a year ago she is remained asymptomatic denying chest pain, shortness of breath or claudication.   Current Meds  Medication Sig  . alendronate (FOSAMAX) 70 MG tablet TAKE 1 TABLET EVERY WEDNESDAY WITH A FULL GLASS OF WATER ON AN EMPTY STOMACH  . aspirin EC 81 MG tablet Take 81 mg by mouth daily.  . betamethasone dipropionate (DIPROLENE) 0.05 % ointment   . betamethasone valerate ointment (VALISONE) 0.1 %   . Blood Glucose Monitoring Suppl (FREESTYLE FREEDOM LITE) w/Device KIT Use to check blood sugar 3 times a day. Dx code e11.65  . clotrimazole-betamethasone (LOTRISONE) cream Apply to affected area 2 times daily prn  . ferrous sulfate 325 (65 FE) MG EC tablet Take 1 tablet (325 mg total) by mouth 3 (three) times daily with meals. (Patient taking differently: Take 325 mg by mouth 3 (three) times daily with meals. 1 daily)   . gabapentin (NEURONTIN) 100 MG capsule One capsule po nightly  . glucose blood (FREESTYLE LITE) test strip Use to check blood sugar 3 times a day. Dx code e11.65  . hydrocortisone 2.5 % cream   . memantine (NAMENDA) 10 MG tablet Take 1 tablet (10 mg total) by mouth 2 (two) times daily.  . metFORMIN (GLUCOPHAGE-XR) 500 MG 24 hr tablet TAKE 1 TABLET TWICE A DAY  . MICARDIS 40 MG tablet TAKE 1 TABLET DAILY  . mometasone (ELOCON) 0.1 % lotion Apply 1 application topically daily as needed (IRRITATION).   Marland Kitchen mupirocin ointment (BACTROBAN) 2 % 1 application as needed.   . pantoprazole (PROTONIX) 40 MG tablet TAKE 1 TABLET DAILY AT 12 NOON  . pravastatin (PRAVACHOL) 40 MG tablet TAKE 1 TABLET DAILY  . Spacer/Aero-Holding Chambers (AEROCHAMBER PLUS) inhaler Use as instructed  . tiotropium (SPIRIVA) 18 MCG inhalation capsule Place 1 capsule (18 mcg total) into inhaler and inhale daily.  Marland Kitchen triamcinolone (KENALOG) 0.1 %      Allergies  Allergen Reactions  . Bactrim [Sulfamethoxazole-Trimethoprim] Swelling    Marked angioedema requiring hospitalization  . Penicillins Other (See Comments)    Has patient had a PCN reaction causing immediate rash, facial/tongue/throat swelling, SOB or lightheadedness with hypotension: Unk Has patient had a PCN reaction causing severe rash involving mucus membranes or skin necrosis: Unk Has patient had a PCN reaction that required hospitalization: Unk Has patient had a PCN reaction occurring within the last 10 years: No If all of the above answers are "NO", then may proceed with Cephalosporin use.  Social History   Socioeconomic History  . Marital status: Widowed    Spouse name: Not on file  . Number of children: 8  . Years of education: 8th grade  . Highest education level: Not on file  Occupational History  . Occupation: retired  Tobacco Use  . Smoking status: Former Smoker    Packs/day: 0.25    Years: 70.00    Pack years: 17.50    Types: Cigarettes     Start date: 03/08/1947  . Smokeless tobacco: Never Used  . Tobacco comment: has tried cold [place]  Vaping Use  . Vaping Use: Never used  Substance and Sexual Activity  . Alcohol use: No    Alcohol/week: 0.0 standard drinks  . Drug use: No  . Sexual activity: Not Currently  Other Topics Concern  . Not on file  Social History Narrative   Lives at home with her son.   She had eight children (two sets of twins).   No daily use of caffeine.   Right-handed.   Social Determinants of Health   Financial Resource Strain: Low Risk   . Difficulty of Paying Living Expenses: Not hard at all  Food Insecurity: No Food Insecurity  . Worried About Charity fundraiser in the Last Year: Never true  . Ran Out of Food in the Last Year: Never true  Transportation Needs: No Transportation Needs  . Lack of Transportation (Medical): No  . Lack of Transportation (Non-Medical): No  Physical Activity: Inactive  . Days of Exercise per Week: 0 days  . Minutes of Exercise per Session: 0 min  Stress: No Stress Concern Present  . Feeling of Stress : Not at all  Social Connections: Not on file  Intimate Partner Violence: Not on file     Review of Systems: General: negative for chills, fever, night sweats or weight changes.  Cardiovascular: negative for chest pain, dyspnea on exertion, edema, orthopnea, palpitations, paroxysmal nocturnal dyspnea or shortness of breath Dermatological: negative for rash Respiratory: negative for cough or wheezing Urologic: negative for hematuria Abdominal: negative for nausea, vomiting, diarrhea, bright red blood per rectum, melena, or hematemesis Neurologic: negative for visual changes, syncope, or dizziness All other systems reviewed and are otherwise negative except as noted above.    Blood pressure (!) 104/52, pulse 74, height 5' (1.524 m), weight 169 lb (76.7 kg).  General appearance: alert and no distress Neck: no adenopathy, no carotid bruit, no JVD, supple,  symmetrical, trachea midline and thyroid not enlarged, symmetric, no tenderness/mass/nodules Lungs: clear to auscultation bilaterally Heart: regular rate and rhythm, S1, S2 normal, no murmur, click, rub or gallop Extremities: extremities normal, atraumatic, no cyanosis or edema Pulses: 2+ and symmetric Skin: Skin color, texture, turgor normal. No rashes or lesions Neurologic: Alert and oriented X 3, normal strength and tone. Normal symmetric reflexes. Normal coordination and gait  EKG normal sinus rhythm at 74 without ST or T wave changes.  Personally reviewed this EKG.  ASSESSMENT AND PLAN:   PAD (peripheral artery disease), abnormal ABIs History of PAD Doppler studies performed 08/28/2018 showing moderate mid right SFA disease and ostial left SFA disease.  The patient denies claudication.  Dyslipidemia History of dyslipidemia on statin therapy with lipid profile performed 02/14/2019 revealing total cholesterol 151, LDL 69 HDL 72.  Essential hypertension History of essential hypertension blood pressure measured today 104/52.  She is on Micardis.      Lorretta Harp MD FACP,FACC,FAHA, [facility] 07/15/2020 4:09 PM

## 2020-07-15 NOTE — Assessment & Plan Note (Signed)
History of PAD Doppler studies performed 08/28/2018 showing moderate mid right SFA disease and ostial left SFA disease.  The patient denies claudication.

## 2020-07-15 NOTE — Assessment & Plan Note (Signed)
History of essential hypertension blood pressure measured today 104/52.  She is on Micardis.

## 2020-07-15 NOTE — Patient Instructions (Signed)

## 2020-07-15 NOTE — Assessment & Plan Note (Signed)
History of dyslipidemia on statin therapy with lipid profile performed 02/14/2019 revealing total cholesterol 151, LDL 69 HDL 72.

## 2020-07-21 DIAGNOSIS — H6063 Unspecified chronic otitis externa, bilateral: Secondary | ICD-10-CM | POA: Diagnosis not present

## 2020-07-21 DIAGNOSIS — H90A32 Mixed conductive and sensorineural hearing loss, unilateral, left ear with restricted hearing on the contralateral side: Secondary | ICD-10-CM | POA: Diagnosis not present

## 2020-07-21 DIAGNOSIS — H6123 Impacted cerumen, bilateral: Secondary | ICD-10-CM | POA: Diagnosis not present

## 2020-07-21 DIAGNOSIS — H608X3 Other otitis externa, bilateral: Secondary | ICD-10-CM | POA: Diagnosis not present

## 2020-08-05 ENCOUNTER — Other Ambulatory Visit: Payer: Self-pay

## 2020-08-06 ENCOUNTER — Other Ambulatory Visit: Payer: Self-pay | Admitting: Internal Medicine

## 2020-08-06 MED ORDER — ALBUTEROL SULFATE (2.5 MG/3ML) 0.083% IN NEBU: 2.5000 mg | INHALATION_SOLUTION | RESPIRATORY_TRACT | 2 refills | Status: DC | PRN

## 2020-08-27 DIAGNOSIS — M545 Low back pain, unspecified: Secondary | ICD-10-CM | POA: Diagnosis not present

## 2020-09-02 ENCOUNTER — Ambulatory Visit (INDEPENDENT_AMBULATORY_CARE_PROVIDER_SITE_OTHER): Payer: Medicare Other | Admitting: Internal Medicine

## 2020-09-02 ENCOUNTER — Other Ambulatory Visit: Payer: Self-pay

## 2020-09-02 ENCOUNTER — Encounter: Payer: Self-pay | Admitting: Internal Medicine

## 2020-09-02 VITALS — BP 128/76 | HR 88 | Temp 98.1°F | Ht 60.0 in | Wt 171.2 lb

## 2020-09-02 DIAGNOSIS — N182 Chronic kidney disease, stage 2 (mild): Secondary | ICD-10-CM | POA: Diagnosis not present

## 2020-09-02 DIAGNOSIS — I700 Atherosclerosis of aorta: Secondary | ICD-10-CM | POA: Diagnosis not present

## 2020-09-02 DIAGNOSIS — D508 Other iron deficiency anemias: Secondary | ICD-10-CM

## 2020-09-02 DIAGNOSIS — E1122 Type 2 diabetes mellitus with diabetic chronic kidney disease: Secondary | ICD-10-CM | POA: Diagnosis not present

## 2020-09-02 DIAGNOSIS — Z6833 Body mass index (BMI) 33.0-33.9, adult: Secondary | ICD-10-CM

## 2020-09-02 DIAGNOSIS — D696 Thrombocytopenia, unspecified: Secondary | ICD-10-CM

## 2020-09-02 DIAGNOSIS — I129 Hypertensive chronic kidney disease with stage 1 through stage 4 chronic kidney disease, or unspecified chronic kidney disease: Secondary | ICD-10-CM | POA: Diagnosis not present

## 2020-09-02 DIAGNOSIS — E785 Hyperlipidemia, unspecified: Secondary | ICD-10-CM | POA: Diagnosis not present

## 2020-09-02 DIAGNOSIS — I739 Peripheral vascular disease, unspecified: Secondary | ICD-10-CM | POA: Diagnosis not present

## 2020-09-02 DIAGNOSIS — Z0000 Encounter for general adult medical examination without abnormal findings: Secondary | ICD-10-CM

## 2020-09-02 DIAGNOSIS — Z23 Encounter for immunization: Secondary | ICD-10-CM

## 2020-09-02 DIAGNOSIS — E6609 Other obesity due to excess calories: Secondary | ICD-10-CM

## 2020-09-02 DIAGNOSIS — L03116 Cellulitis of left lower limb: Secondary | ICD-10-CM | POA: Diagnosis not present

## 2020-09-02 LAB — POCT URINALYSIS DIPSTICK
Bilirubin, UA: NEGATIVE
Glucose, UA: NEGATIVE
Ketones, UA: NEGATIVE
Nitrite, UA: NEGATIVE
Protein, UA: NEGATIVE
Spec Grav, UA: 1.025 (ref 1.010–1.025)
Urobilinogen, UA: 0.2 E.U./dL
pH, UA: 5.5 (ref 5.0–8.0)

## 2020-09-02 LAB — POCT UA - MICROALBUMIN
Albumin/Creatinine Ratio, Urine, POC: <30
Creatinine, POC: 200 mg/dL
Microalbumin Ur, POC: 30 mg/L

## 2020-09-02 MED ORDER — DOXYCYCLINE HYCLATE 100 MG PO CAPS: 100.0000 mg | ORAL_CAPSULE | Freq: Two times a day (BID) | ORAL | 0 refills | Status: DC

## 2020-09-02 MED ORDER — TETANUS-DIPHTH-ACELL PERTUSSIS 5-2.5-18.5 LF-MCG/0.5 IM SUSP: 0.5000 mL | Freq: Once | INTRAMUSCULAR | 0 refills | Status: AC

## 2020-09-02 NOTE — Patient Instructions (Signed)
Health Maintenance, Female Adopting a healthy lifestyle and getting preventive care are important in promoting health and wellness. Ask your health care provider about: The right schedule for you to have regular tests and exams. Things you can do on your own to prevent diseases and keep yourself healthy. What should I know about diet, weight, and exercise? Eat a healthy diet  Eat a diet that includes plenty of vegetables, fruits, low-fat dairy products, and lean protein. Do not eat a lot of foods that are high in solid fats, added sugars, or sodium.  Maintain a healthy weight Body mass index (BMI) is used to identify weight problems. It estimates body fat based on height and weight. Your health care provider can help determineyour BMI and help you achieve or maintain a healthy weight. Get regular exercise Get regular exercise. This is one of the most important things you can do for your health. Most adults should: Exercise for at least 150 minutes each week. The exercise should increase your heart rate and make you sweat (moderate-intensity exercise). Do strengthening exercises at least twice a week. This is in addition to the moderate-intensity exercise. Spend less time sitting. Even light physical activity can be beneficial. Watch cholesterol and blood lipids Have your blood tested for lipids and cholesterol at 85 years of age, then havethis test every 5 years. Have your cholesterol levels checked more often if: Your lipid or cholesterol levels are high. You are older than 85 years of age. You are at high risk for heart disease. What should I know about cancer screening? Depending on your health history and family history, you may need to have cancer screening at various ages. This may include screening for: Breast cancer. Cervical cancer. Colorectal cancer. Skin cancer. Lung cancer. What should I know about heart disease, diabetes, and high blood pressure? Blood pressure and heart  disease High blood pressure causes heart disease and increases the risk of stroke. This is more likely to develop in people who have high blood pressure readings, are of African descent, or are overweight. Have your blood pressure checked: Every 3-5 years if you are 18-39 years of age. Every year if you are 40 years old or older. Diabetes Have regular diabetes screenings. This checks your fasting blood sugar level. Have the screening done: Once every three years after age 40 if you are at a normal weight and have a low risk for diabetes. More often and at a younger age if you are overweight or have a high risk for diabetes. What should I know about preventing infection? Hepatitis B If you have a higher risk for hepatitis B, you should be screened for this virus. Talk with your health care provider to find out if you are at risk forhepatitis B infection. Hepatitis C Testing is recommended for: Everyone born from 1945 through 1965. Anyone with known risk factors for hepatitis C. Sexually transmitted infections (STIs) Get screened for STIs, including gonorrhea and chlamydia, if: You are sexually active and are younger than 85 years of age. You are older than 85 years of age and your health care provider tells you that you are at risk for this type of infection. Your sexual activity has changed since you were last screened, and you are at increased risk for chlamydia or gonorrhea. Ask your health care provider if you are at risk. Ask your health care provider about whether you are at high risk for HIV. Your health care provider may recommend a prescription medicine to help   prevent HIV infection. If you choose to take medicine to prevent HIV, you should first get tested for HIV. You should then be tested every 3 months for as long as you are taking the medicine. Pregnancy If you are about to stop having your period (premenopausal) and you may become pregnant, seek counseling before you get  pregnant. Take 400 to 800 micrograms (mcg) of folic acid every day if you become pregnant. Ask for birth control (contraception) if you want to prevent pregnancy. Osteoporosis and menopause Osteoporosis is a disease in which the bones lose minerals and strength with aging. This can result in bone fractures. If you are 65 years old or older, or if you are at risk for osteoporosis and fractures, ask your health care provider if you should: Be screened for bone loss. Take a calcium or vitamin D supplement to lower your risk of fractures. Be given hormone replacement therapy (HRT) to treat symptoms of menopause. Follow these instructions at home: Lifestyle Do not use any products that contain nicotine or tobacco, such as cigarettes, e-cigarettes, and chewing tobacco. If you need help quitting, ask your health care provider. Do not use street drugs. Do not share needles. Ask your health care provider for help if you need support or information about quitting drugs. Alcohol use Do not drink alcohol if: Your health care provider tells you not to drink. You are pregnant, may be pregnant, or are planning to become pregnant. If you drink alcohol: Limit how much you use to 0-1 drink a day. Limit intake if you are breastfeeding. Be aware of how much alcohol is in your drink. In the [place] one drink equals one 12 oz bottle of beer (355 mL), one 5 oz glass of wine (148 mL), or one 1 oz glass of hard liquor (44 mL). General instructions Schedule regular health, dental, and eye exams. Stay current with your vaccines. Tell your health care provider if: You often feel depressed. You have ever been abused or do not feel safe at home. Summary Adopting a healthy lifestyle and getting preventive care are important in promoting health and wellness. Follow your health care provider's instructions about healthy diet, exercising, and getting tested or screened for diseases. Follow your health care provider's  instructions on monitoring your cholesterol and blood pressure. This information is not intended to replace advice given to you by your health care provider. Make sure you discuss any questions you have with your healthcare provider. Document Revised: 02/14/2018 Document Reviewed: 02/14/2018 Elsevier Patient Education  2022 [employer]  

## 2020-09-02 NOTE — Progress Notes (Signed)
I,Yamilka [employer] as a Education administrator for Maximino Greenland, MD.,have documented all relevant documentation on the behalf of Maximino Greenland, MD,as directed by  Maximino Greenland, MD while in the presence of Maximino Greenland, MD.  This visit occurred during the SARS-CoV-2 public health emergency.  Safety protocols were in place, including screening questions prior to the visit, additional usage of staff PPE, and extensive cleaning of exam room while observing appropriate contact time as indicated for disinfecting solutions.  Subjective:     Patient ID: Leah Wiggins , female    DOB: [birth year] , 87 y.o.   MRN: [id number]   Chief Complaint  Patient presents with   Annual Exam   Diabetes   Hypertension    HPI  Patient here for physical. She is accompanied by her daughter. She reports compliance with meds. Daughter fills pill box for her weekly. She c/o right foot pain. States it itches. Denies fall/trauma. Unable to state what triggers her sx.  She then talks about her other arthritic symptoms.   Diabetes She presents for her follow-up diabetic visit. She has type 2 diabetes mellitus. There are no hypoglycemic associated symptoms. Pertinent negatives for diabetes include no blurred vision. There are no hypoglycemic complications. Risk factors for coronary artery disease include diabetes mellitus, dyslipidemia, hypertension, obesity, sedentary lifestyle and post-menopausal.  Hypertension This is a chronic problem. The current episode started more than 1 year ago. The problem has been gradually improving since onset. The problem is controlled. Pertinent negatives include no blurred vision. The current treatment provides moderate improvement.    Past Medical History:  Diagnosis Date   Arthritis    "shoulders" (05/04/2017)   Breast cancer, right breast ([place]) 1991   GERD (gastroesophageal reflux disease)    Hard of hearing    Hyperlipidemia    Hypertension    Memory loss    Non-stress  test nonreactive, 04/24/12, normal 12/10/2012   OSA on CPAP    "don't wear it all the time" (05/04/2017)   PVD (peripheral vascular disease) ([place])    Tobacco use 12/10/2012   Type II diabetes mellitus ([place])      Family History  Problem Relation Age of Onset   Heart disease Mother    Stroke Mother    Stroke Father    Cancer Sister        GIST   Cancer Brother        thyroid    Diabetes Son    Stroke Son      Current Outpatient Medications:    albuterol (PROVENTIL) (2.5 MG/3ML) 0.083% nebulizer solution, Take 3 mLs (2.5 mg total) by nebulization every 4 (four) hours as needed for wheezing or shortness of breath., Disp: 75 mL, Rfl: 2   alendronate (FOSAMAX) 70 MG tablet, TAKE 1 TABLET EVERY WEDNESDAY WITH A FULL GLASS OF WATER ON AN EMPTY STOMACH, Disp: 12 tablet, Rfl: 3   aspirin EC 81 MG tablet, Take 81 mg by mouth daily., Disp: , Rfl:    betamethasone dipropionate (DIPROLENE) 0.05 % ointment, , Disp: , Rfl:    betamethasone valerate ointment (VALISONE) 0.1 %, , Disp: , Rfl:    Blood Glucose Monitoring Suppl (FREESTYLE FREEDOM LITE) w/Device KIT, Use to check blood sugar 3 times a day. Dx code e11.65, Disp: 1 kit, Rfl: 3   clotrimazole-betamethasone (LOTRISONE) cream, Apply to affected area 2 times daily prn, Disp: 30 g, Rfl: 0   doxycycline (VIBRAMYCIN) 100 MG capsule, Take 1 capsule (100 mg total)  by mouth 2 (two) times daily., Disp: 20 capsule, Rfl: 0   ferrous sulfate 325 (65 FE) MG EC tablet, Take 1 tablet (325 mg total) by mouth 3 (three) times daily with meals. (Patient taking differently: Take 325 mg by mouth 3 (three) times daily with meals. 1 daily), Disp: 90 tablet, Rfl: 2   gabapentin (NEURONTIN) 100 MG capsule, One capsule po nightly, Disp: 90 capsule, Rfl: 2   glucose blood (FREESTYLE LITE) test strip, Use to check blood sugar 3 times a day. Dx code e11.65, Disp: 150 each, Rfl: 3   hydrocortisone 2.5 % cream, , Disp: , Rfl:    memantine (NAMENDA) 10 MG tablet, Take 1  tablet (10 mg total) by mouth 2 (two) times daily., Disp: 60 tablet, Rfl: 11   metFORMIN (GLUCOPHAGE-XR) 500 MG 24 hr tablet, TAKE 1 TABLET TWICE A DAY, Disp: 180 tablet, Rfl: 3   MICARDIS 40 MG tablet, TAKE 1 TABLET DAILY, Disp: 90 tablet, Rfl: 3   mometasone (ELOCON) 0.1 % lotion, Apply 1 application topically daily as needed (IRRITATION). , Disp: , Rfl: 0   mupirocin ointment (BACTROBAN) 2 %, 1 application as needed. , Disp: , Rfl: 0   pantoprazole (PROTONIX) 40 MG tablet, TAKE 1 TABLET DAILY AT 12 NOON, Disp: 90 tablet, Rfl: 3   pravastatin (PRAVACHOL) 40 MG tablet, TAKE 1 TABLET DAILY, Disp: 90 tablet, Rfl: 3   Spacer/Aero-Holding Chambers (AEROCHAMBER PLUS) inhaler, Use as instructed, Disp: 1 each, Rfl: 2   tiotropium (SPIRIVA) 18 MCG inhalation capsule, Place 1 capsule (18 mcg total) into inhaler and inhale daily., Disp: 90 capsule, Rfl: 2   triamcinolone (KENALOG) 0.1 %, , Disp: , Rfl:    Allergies  Allergen Reactions   Bactrim [Sulfamethoxazole-Trimethoprim] Swelling    Marked angioedema requiring hospitalization   Penicillins Other (See Comments)    Has patient had a PCN reaction causing immediate rash, facial/tongue/throat swelling, SOB or lightheadedness with hypotension: Unk Has patient had a PCN reaction causing severe rash involving mucus membranes or skin necrosis: Unk Has patient had a PCN reaction that required hospitalization: Unk Has patient had a PCN reaction occurring within the last 10 years: No If all of the above answers are "NO", then may proceed with Cephalosporin use.      The patient states she uses none for birth control. Last LMP was No LMP recorded. Patient is postmenopausal.. Negative for Dysmenorrhea. Negative for: breast discharge, breast lump(s), breast pain and breast self exam. Associated symptoms include abnormal vaginal bleeding. Pertinent negatives include abnormal bleeding (hematology), anxiety, decreased libido, depression, difficulty falling sleep,  dyspareunia, history of infertility, nocturia, sexual dysfunction, sleep disturbances, urinary incontinence, urinary urgency, vaginal discharge and vaginal itching. Diet regular.The patient states her exercise level is  minimal.  . The patient's tobacco use is:  Social History   Tobacco Use  Smoking Status Former   Packs/day: 0.25   Years: 70.00   Pack years: 17.50   Types: Cigarettes   Start date: 03/08/1947  Smokeless Tobacco Never  Tobacco Comments   has tried cold [place]  . She has been exposed to passive smoke. The patient's alcohol use is:  Social History   Substance and Sexual Activity  Alcohol Use No   Alcohol/week: 0.0 standard drinks   Review of Systems  Constitutional: Negative.   HENT: Negative.    Eyes: Negative.  Negative for blurred vision.  Respiratory: Negative.    Cardiovascular: Negative.   Gastrointestinal: Negative.   Endocrine: Negative.   Genitourinary: Negative.  Musculoskeletal: Negative.   Skin: Negative.   Allergic/Immunologic: Negative.   Neurological: Negative.   Hematological: Negative.   Psychiatric/Behavioral: Negative.      Today's Vitals   09/02/20 1453  BP: 128/76  Pulse: 88  Temp: 98.1 F (36.7 C)  Weight: 171 lb 3.2 oz (77.7 kg)  Height: 5' (1.524 m)   Body mass index is 33.44 kg/m.  Wt Readings from Last 3 Encounters:  09/02/20 171 lb 3.2 oz (77.7 kg)  07/15/20 169 lb (76.7 kg)  05/11/20 176 lb 3.2 oz (79.9 kg)     Objective:  Physical Exam Vitals and nursing note reviewed.  Constitutional:      Appearance: Normal appearance. She is obese.  HENT:     Head: Normocephalic and atraumatic.     Right Ear: Tympanic membrane, ear canal and external ear normal.     Left Ear: Tympanic membrane, ear canal and external ear normal.     Nose:     Comments: Masked     Mouth/Throat:     Comments: Masked  Eyes:     Extraocular Movements: Extraocular movements intact.     Conjunctiva/sclera: Conjunctivae normal.     Pupils:  Pupils are equal, round, and reactive to light.  Cardiovascular:     Rate and Rhythm: Normal rate and regular rhythm.     Pulses:          Dorsalis pedis pulses are 0 on the right side and 1+ on the left side.       Posterior tibial pulses are 0 on the right side and 0 on the left side.     Heart sounds: Normal heart sounds.  Pulmonary:     Effort: Pulmonary effort is normal.     Breath sounds: Normal breath sounds.  Chest:  Breasts:    Right: Absent.     Left: Normal.     Comments: S/p right mastectomy Abdominal:     General: Bowel sounds are normal.     Palpations: Abdomen is soft.  Genitourinary:    Comments: deferred Musculoskeletal:        General: Normal range of motion.     Cervical back: Normal range of motion and neck supple.     Right foot: Bunion present.     Left foot: Bunion present.  Feet:     Right foot:     Protective Sensation: 5 sites tested.  3 sites sensed.     Skin integrity: Callus and dry skin present.     Toenail Condition: Right toenails are abnormally thick.     Left foot:     Protective Sensation: 5 sites tested.  3 sites sensed.     Skin integrity: Erythema, warmth, callus and dry skin present.     Toenail Condition: Left toenails are abnormally thick.     Comments: Dry scaly skin on right, peeling Also with erythema and warm on the right Skin:    General: Skin is warm and dry.  Neurological:     General: No focal deficit present.     Mental Status: She is alert and oriented to person, place, and time.  Psychiatric:        Mood and Affect: Mood normal.        Behavior: Behavior normal.        Assessment And Plan:     1. Encounter for general adult medical examination w/o abnormal findings Comments: A full exam was performed. She is encouraged to perform monthly breast exams.  PATIENT IS ADVISED TO GET 30-45 MINUTES REGULAR EXERCISE NO LESS THAN FOUR TO FIVE DAYS PER WEEK - BOTH WEIGHTBEARING EXERCISES AND AEROBIC ARE RECOMMENDED.  PATIENT  IS ADVISED TO FOLLOW A HEALTHY DIET WITH AT LEAST SIX FRUITS/VEGGIES PER DAY, DECREASE INTAKE OF RED MEAT, AND TO INCREASE FISH INTAKE TO TWO DAYS PER WEEK.  MEATS/FISH SHOULD NOT BE FRIED, BAKED OR BROILED IS PREFERABLE.  IT IS ALSO IMPORTANT TO CUT BACK ON YOUR SUGAR INTAKE. PLEASE AVOID ANYTHING WITH ADDED SUGAR, CORN SYRUP OR OTHER SWEETENERS. IF YOU MUST USE A SWEETENER, YOU CAN TRY STEVIA. IT IS ALSO IMPORTANT TO AVOID ARTIFICIALLY SWEETENERS AND DIET BEVERAGES. LASTLY, I SUGGEST WEARING SPF 50 SUNSCREEN ON EXPOSED PARTS AND ESPECIALLY WHEN IN THE DIRECT SUNLIGHT FOR AN EXTENDED PERIOD OF TIME.  PLEASE AVOID FAST FOOD RESTAURANTS AND INCREASE YOUR WATER INTAKE.   2. Type 2 diabetes mellitus with stage 2 chronic kidney disease, without long-term current use of insulin (HCC) Comments: Diabetic foot exam was performed. I DISCUSSED WITH THE PATIENT AT LENGTH REGARDING THE GOALS OF GLYCEMIC CONTROL AND POSSIBLE LONG-TERM COMPLICATIONS.  I  ALSO STRESSED THE IMPORTANCE OF COMPLIANCE WITH HOME GLUCOSE MONITORING, DIETARY RESTRICTIONS INCLUDING AVOIDANCE OF SUGARY DRINKS/PROCESSED FOODS,  ALONG WITH REGULAR EXERCISE.  I  ALSO STRESSED THE IMPORTANCE OF ANNUAL EYE EXAMS, SELF FOOT CARE AND COMPLIANCE WITH OFFICE VISITS.  - CMP14+EGFR - Hemoglobin A1c - Lipid panel - POCT Urinalysis Dipstick (81002) - POCT UA - Microalbumin  3. Hypertensive nephropathy Comments: Chronic, well controlled. Encouraged to follow low sodium diet. EKG performed, NSR w/o acute changes.  She is encouraged to follow low sodium diet. She will f/u in six months.  - CMP14+EGFR - EKG 12-Lead  4. PVD (peripheral vascular disease) ([place]) Comments: Chronic, likely contributing to issues with RLE. I will refer her to Vascular for further evaluation after going to the wound clinic.  5. Atherosclerosis of aorta (HCC) Comments: Chronic. Importance of following heart healthy lifestyle and statin compliance was stressed to the patient.    6. Other iron deficiency anemia Comments: I will check labs as listed below. I will adjust iron supplementation regimen as needed.  - CMP14+EGFR - Iron and IBC ([id number]) - Ferritin - CBC with Diff  7. Cellulitis of left foot Comments: She is PCN allergic, will send rx doxycycline to the pharmacy. I will also refer her to Wound clinic for further evaluation.  - doxycycline (VIBRAMYCIN) 100 MG capsule; Take 1 capsule (100 mg total) by mouth 2 (two) times daily.  Dispense: 20 capsule; Refill: 0 - Ambulatory referral to Wound Clinic  8. Thrombocytopenia ([place]) Comments: I will check CBC w/ diff today.  9. Class 1 obesity due to excess calories with serious comorbidity and body mass index (BMI) of 33.0 to 33.9 in adult Comments: She is encouraged to strive for BMI less than 30 to decrease cardiac risk. Advised to perform chair exercises while watching TV.   10. Immunization due Comments: I will send rx Boostrix (Tdap) to her local pharmacy.  - Tdap (BOOSTRIX) 5-2.5-18.5 LF-MCG/0.5 injection; Inject 0.5 mLs into the muscle once for 1 dose.  Dispense: 0.5 mL; Refill: 0   Patient was given opportunity to ask questions. Patient verbalized understanding of the plan and was able to repeat key elements of the plan. All questions were answered to their satisfaction.   I, Maximino Greenland, MD, have reviewed all documentation for this visit. The documentation on 09/03/20 for the exam, diagnosis, procedures, and orders are  all accurate and complete.  THE PATIENT IS ENCOURAGED TO PRACTICE SOCIAL DISTANCING DUE TO THE COVID-19 PANDEMIC.

## 2020-09-03 ENCOUNTER — Ambulatory Visit (INDEPENDENT_AMBULATORY_CARE_PROVIDER_SITE_OTHER): Payer: Medicare Other | Admitting: Sports Medicine

## 2020-09-03 ENCOUNTER — Encounter: Payer: Self-pay | Admitting: Sports Medicine

## 2020-09-03 ENCOUNTER — Encounter: Payer: Self-pay | Admitting: Internal Medicine

## 2020-09-03 DIAGNOSIS — B351 Tinea unguium: Secondary | ICD-10-CM

## 2020-09-03 DIAGNOSIS — M79609 Pain in unspecified limb: Secondary | ICD-10-CM

## 2020-09-03 DIAGNOSIS — E6609 Other obesity due to excess calories: Secondary | ICD-10-CM | POA: Insufficient documentation

## 2020-09-03 DIAGNOSIS — I129 Hypertensive chronic kidney disease with stage 1 through stage 4 chronic kidney disease, or unspecified chronic kidney disease: Secondary | ICD-10-CM | POA: Insufficient documentation

## 2020-09-03 DIAGNOSIS — I130 Hypertensive heart and chronic kidney disease with heart failure and stage 1 through stage 4 chronic kidney disease, or unspecified chronic kidney disease: Secondary | ICD-10-CM | POA: Insufficient documentation

## 2020-09-03 DIAGNOSIS — I739 Peripheral vascular disease, unspecified: Secondary | ICD-10-CM

## 2020-09-03 DIAGNOSIS — N182 Chronic kidney disease, stage 2 (mild): Secondary | ICD-10-CM | POA: Insufficient documentation

## 2020-09-03 DIAGNOSIS — I700 Atherosclerosis of aorta: Secondary | ICD-10-CM | POA: Insufficient documentation

## 2020-09-03 DIAGNOSIS — I119 Hypertensive heart disease without heart failure: Secondary | ICD-10-CM | POA: Insufficient documentation

## 2020-09-03 DIAGNOSIS — E1122 Type 2 diabetes mellitus with diabetic chronic kidney disease: Secondary | ICD-10-CM | POA: Insufficient documentation

## 2020-09-03 DIAGNOSIS — L03116 Cellulitis of left lower limb: Secondary | ICD-10-CM | POA: Insufficient documentation

## 2020-09-03 DIAGNOSIS — D649 Anemia, unspecified: Secondary | ICD-10-CM | POA: Insufficient documentation

## 2020-09-03 DIAGNOSIS — E119 Type 2 diabetes mellitus without complications: Secondary | ICD-10-CM

## 2020-09-03 LAB — CMP14+EGFR
ALT: 13 IU/L (ref 0–32)
AST: 18 IU/L (ref 0–40)
Albumin/Globulin Ratio: 1.4 (ref 1.2–2.2)
Albumin: 3.8 g/dL (ref 3.5–4.6)
Alkaline Phosphatase: 105 IU/L (ref 44–121)
BUN/Creatinine Ratio: 17 (ref 12–28)
BUN: 17 mg/dL (ref 10–36)
Bilirubin Total: <0.2 mg/dL (ref 0.0–1.2)
CO2: 25 mmol/L (ref 20–29)
Calcium: 9.1 mg/dL (ref 8.7–10.3)
Chloride: 104 mmol/L (ref 96–106)
Creatinine, Ser: 1 mg/dL (ref 0.57–1.00)
Globulin, Total: 2.7 g/dL (ref 1.5–4.5)
Glucose: 215 mg/dL — ABNORMAL HIGH (ref 65–99)
Potassium: 4 mmol/L (ref 3.5–5.2)
Sodium: 145 mmol/L — ABNORMAL HIGH (ref 134–144)
Total Protein: 6.5 g/dL (ref 6.0–8.5)
eGFR: 53 mL/min/{1.73_m2} — ABNORMAL LOW (ref >59)

## 2020-09-03 LAB — CBC WITH DIFFERENTIAL/PLATELET
Basophils Absolute: 0 10*3/uL (ref 0.0–0.2)
Basos: 1 %
EOS (ABSOLUTE): 0.1 10*3/uL (ref 0.0–0.4)
Eos: 1 %
Hematocrit: 37.1 % (ref 34.0–46.6)
Hemoglobin: 11.2 g/dL (ref 11.1–15.9)
Immature Grans (Abs): 0 10*3/uL (ref 0.0–0.1)
Immature Granulocytes: 1 %
Lymphocytes Absolute: 0.8 10*3/uL (ref 0.7–3.1)
Lymphs: 14 %
MCH: 23.6 pg — ABNORMAL LOW (ref 26.6–33.0)
MCHC: 30.2 g/dL — ABNORMAL LOW (ref 31.5–35.7)
MCV: 78 fL — ABNORMAL LOW (ref 79–97)
Monocytes Absolute: 0.6 10*3/uL (ref 0.1–0.9)
Monocytes: 10 %
Neutrophils Absolute: 4.3 10*3/uL (ref 1.4–7.0)
Neutrophils: 73 %
RBC: 4.74 x10E6/uL (ref 3.77–5.28)
RDW: 16.5 % — ABNORMAL HIGH (ref 11.7–15.4)
WBC: 5.8 10*3/uL (ref 3.4–10.8)

## 2020-09-03 LAB — LIPID PANEL
Chol/HDL Ratio: 2.5 ratio (ref 0.0–4.4)
Cholesterol, Total: 166 mg/dL (ref 100–199)
HDL: 67 mg/dL (ref >39)
LDL Chol Calc (NIH): 81 mg/dL (ref 0–99)
Triglycerides: 102 mg/dL (ref 0–149)
VLDL Cholesterol Cal: 18 mg/dL (ref 5–40)

## 2020-09-03 LAB — IRON AND TIBC
Iron Saturation: 31 % (ref 15–55)
Iron: 82 ug/dL (ref 27–139)
Total Iron Binding Capacity: 268 ug/dL (ref 250–450)
UIBC: 186 ug/dL (ref 118–369)

## 2020-09-03 LAB — FERRITIN: Ferritin: 29 ng/mL (ref 15–150)

## 2020-09-03 LAB — HEMOGLOBIN A1C
Est. average glucose Bld gHb Est-mCnc: 143 mg/dL
Hgb A1c MFr Bld: 6.6 % — ABNORMAL HIGH (ref 4.8–5.6)

## 2020-09-03 NOTE — Progress Notes (Signed)
Subjective: Leah Wiggins is a 87 y.o. female patient with history of diabetes who presents to office today complaining of long,mildly painful nails  while ambulating in shoes; unable to trim. FBS yesterday 215. PCP visit yesterday. Patient denies any new changes in medication or new problems.  Patient Active Problem List   Diagnosis Date Noted   Hypertensive nephropathy 09/03/2020   Type 2 diabetes mellitus with stage 2 chronic kidney disease, without long-term current use of insulin ([place]) 09/03/2020   Atherosclerosis of aorta ([place]) 09/03/2020   Absolute anemia 09/03/2020   Cellulitis of left foot 09/03/2020   Class 1 obesity due to excess calories with serious comorbidity and body mass index (BMI) of 33.0 to 33.9 in adult 09/03/2020   Abnormal feces 06/04/2020   Abnormal weight loss 06/04/2020   Angiodysplasia of intestine 06/04/2020   Colon cancer screening 06/04/2020   Gastroesophageal reflux disease 06/04/2020   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 hearing of right ear 06/04/2020   Prolapsed internal hemorrhoids 06/04/2020   Rectal bleeding 06/04/2020   Sensorineural hearing loss (SNHL) of right ear with restricted hearing of left ear 06/04/2020   Mild cognitive impairment 12/23/2019   Memory loss 06/24/2019   Intrinsic atopic dermatitis 09/18/2018   Chronic obstructive pulmonary disease ([place]) 04/06/2018   Prediabetes 02/07/2018   Iron deficiency anemia due to chronic blood loss 12/25/2017   Anaphylaxis 05/04/2017   Rash and nonspecific skin eruption 05/04/2017   History of breast cancer 08/14/2014   Empty sella ([place]) 07/14/2014   Pain in the chest 04/28/2014   Diabetes mellitus without complication ([place]) 81/19/1478   Chest pain 03/03/2014   Essential hypertension 06/24/2013   PAD (peripheral artery disease), abnormal ABIs 12/10/2012   Non-stress test nonreactive, 04/24/12, normal 12/10/2012   Dyslipidemia 12/10/2012   Tobacco use  12/10/2012   Thrombocytopenia ([place]) 01/24/2012   RESTLESS LEG SYNDROME 02/16/2010   METHICILLIN SUSECPTIBLE PNEUMONIA STAPH AUREUS 01/13/2010   HIP THI LEG&ANK ABRASION/FRICION BURN W/O INF 01/13/2010   Current Outpatient Medications on File Prior to Visit  Medication Sig Dispense Refill   albuterol (PROVENTIL) (2.5 MG/3ML) 0.083% nebulizer solution Take 3 mLs (2.5 mg total) by nebulization every 4 (four) hours as needed for wheezing or shortness of breath. 75 mL 2   alendronate (FOSAMAX) 70 MG tablet TAKE 1 TABLET EVERY WEDNESDAY WITH A FULL GLASS OF WATER ON AN EMPTY STOMACH 12 tablet 3   aspirin EC 81 MG tablet Take 81 mg by mouth daily.     betamethasone dipropionate (DIPROLENE) 0.05 % ointment      betamethasone valerate ointment (VALISONE) 0.1 %      Blood Glucose Monitoring Suppl (FREESTYLE FREEDOM LITE) w/Device KIT Use to check blood sugar 3 times a day. Dx code e11.65 1 kit 3   clotrimazole-betamethasone (LOTRISONE) cream Apply to affected area 2 times daily prn 30 g 0   doxycycline (VIBRAMYCIN) 100 MG capsule Take 1 capsule (100 mg total) by mouth 2 (two) times daily. 20 capsule 0   ferrous sulfate 325 (65 FE) MG EC tablet Take 1 tablet (325 mg total) by mouth 3 (three) times daily with meals. (Patient taking differently: Take 325 mg by mouth 3 (three) times daily with meals. 1 daily) 90 tablet 2   gabapentin (NEURONTIN) 100 MG capsule One capsule po nightly 90 capsule 2   glucose blood (FREESTYLE LITE) test strip Use to check blood sugar 3 times a day. Dx code e11.65 150 each 3  hydrocortisone 2.5 % cream      memantine (NAMENDA) 10 MG tablet Take 1 tablet (10 mg total) by mouth 2 (two) times daily. 60 tablet 11   metFORMIN (GLUCOPHAGE-XR) 500 MG 24 hr tablet TAKE 1 TABLET TWICE A DAY 180 tablet 3   MICARDIS 40 MG tablet TAKE 1 TABLET DAILY 90 tablet 3   mometasone (ELOCON) 0.1 % lotion Apply 1 application topically daily as needed (IRRITATION).   0   mupirocin ointment  (BACTROBAN) 2 % 1 application as needed.   0   pantoprazole (PROTONIX) 40 MG tablet TAKE 1 TABLET DAILY AT 12 NOON 90 tablet 3   pravastatin (PRAVACHOL) 40 MG tablet TAKE 1 TABLET DAILY 90 tablet 3   Spacer/Aero-Holding Chambers (AEROCHAMBER PLUS) inhaler Use as instructed 1 each 2   tiotropium (SPIRIVA) 18 MCG inhalation capsule Place 1 capsule (18 mcg total) into inhaler and inhale daily. 90 capsule 2   triamcinolone (KENALOG) 0.1 %      No current facility-administered medications on file prior to visit.   Allergies  Allergen Reactions   Bactrim [Sulfamethoxazole-Trimethoprim] Swelling    Marked angioedema requiring hospitalization   Penicillins Other (See Comments)    Has patient had a PCN reaction causing immediate rash, facial/tongue/throat swelling, SOB or lightheadedness with hypotension: Unk Has patient had a PCN reaction causing severe rash involving mucus membranes or skin necrosis: Unk Has patient had a PCN reaction that required hospitalization: Unk Has patient had a PCN reaction occurring within the last 10 years: No If all of the above answers are "NO", then may proceed with Cephalosporin use.    Recent Results (from the past 2160 hour(s))  CMP14+EGFR     Status: Abnormal   Collection Time: 09/02/20  3:56 PM  Result Value Ref Range   Glucose 215 (H) 65 - 99 mg/dL   BUN 17 10 - 36 mg/dL   Creatinine, Ser 1.00 0.57 - 1.00 mg/dL   eGFR 53 (L) >59 mL/min/1.73   BUN/Creatinine Ratio 17 12 - 28   Sodium 145 (H) 134 - 144 mmol/L   Potassium 4.0 3.5 - 5.2 mmol/L   Chloride 104 96 - 106 mmol/L   CO2 25 20 - 29 mmol/L   Calcium 9.1 8.7 - 10.3 mg/dL   Total Protein 6.5 6.0 - 8.5 g/dL   Albumin 3.8 3.5 - 4.6 g/dL   Globulin, Total 2.7 1.5 - 4.5 g/dL   Albumin/Globulin Ratio 1.4 1.2 - 2.2   Bilirubin Total <0.2 0.0 - 1.2 mg/dL   Alkaline Phosphatase 105 44 - 121 IU/L   AST 18 0 - 40 IU/L   ALT 13 0 - 32 IU/L  Hemoglobin A1c     Status: Abnormal   Collection Time:  09/02/20  3:56 PM  Result Value Ref Range   Hgb A1c MFr Bld 6.6 (H) 4.8 - 5.6 %    Comment:          Prediabetes: 5.7 - 6.4          Diabetes: >6.4          Glycemic control for adults with diabetes: <7.0    Est. average glucose Bld gHb Est-mCnc 143 mg/dL  Lipid panel     Status: None   Collection Time: 09/02/20  3:56 PM  Result Value Ref Range   Cholesterol, Total 166 100 - 199 mg/dL   Triglycerides 102 0 - 149 mg/dL   HDL 67 >39 mg/dL   VLDL Cholesterol Cal 18 5 -  40 mg/dL   LDL Chol Calc (NIH) 81 0 - 99 mg/dL   Chol/HDL Ratio 2.5 0.0 - 4.4 ratio    Comment:                                   T. Chol/HDL Ratio                                             Men  Women                               1/2 Avg.Risk  3.4    3.3                                   Avg.Risk  5.0    4.4                                2X Avg.Risk  9.6    7.1                                3X Avg.Risk 23.4   11.0   Iron and IBC ([id number])     Status: None   Collection Time: 09/02/20  3:56 PM  Result Value Ref Range   Total Iron Binding Capacity 268 250 - 450 ug/dL   UIBC 186 118 - 369 ug/dL   Iron 82 27 - 139 ug/dL   Iron Saturation 31 15 - 55 %  Ferritin     Status: None   Collection Time: 09/02/20  3:56 PM  Result Value Ref Range   Ferritin 29 15 - 150 ng/mL  CBC with Diff     Status: Abnormal   Collection Time: 09/02/20  3:56 PM  Result Value Ref Range   WBC 5.8 3.4 - 10.8 x10E3/uL   RBC 4.74 3.77 - 5.28 x10E6/uL   Hemoglobin 11.2 11.1 - 15.9 g/dL   Hematocrit 37.1 34.0 - 46.6 %   MCV 78 (L) 79 - 97 fL   MCH 23.6 (L) 26.6 - 33.0 pg   MCHC 30.2 (L) 31.5 - 35.7 g/dL   RDW 16.5 (H) 11.7 - 15.4 %   Platelets CANCELED x10E3/uL    Comment: Unable to perform an accurate platelet count due to aggregation of the platelets.  Result canceled by the ancillary.    Neutrophils 73 Not Estab. %   Lymphs 14 Not Estab. %   Monocytes 10 Not Estab. %   Eos 1 Not Estab. %   Basos 1 Not Estab. %    Neutrophils Absolute 4.3 1.4 - 7.0 x10E3/uL   Lymphocytes Absolute 0.8 0.7 - 3.1 x10E3/uL   Monocytes Absolute 0.6 0.1 - 0.9 x10E3/uL   EOS (ABSOLUTE) 0.1 0.0 - 0.4 x10E3/uL   Basophils Absolute 0.0 0.0 - 0.2 x10E3/uL   Immature Granulocytes 1 Not Estab. %   Immature Grans (Abs) 0.0 0.0 - 0.1 x10E3/uL   Hematology Comments: Note:     Comment: Verified by microscopic examination.  POCT Urinalysis Dipstick (62703)     Status: Abnormal  Collection Time: 09/02/20  5:17 PM  Result Value Ref Range   Color, UA yellowc    Clarity, UA clear    Glucose, UA Negative Negative   Bilirubin, UA Negative    Ketones, UA Negative    Spec Grav, UA 1.025 1.010 - 1.025   Blood, UA small    pH, UA 5.5 5.0 - 8.0   Protein, UA Negative Negative   Urobilinogen, UA 0.2 0.2 or 1.0 E.U./dL   Nitrite, UA Negative    Leukocytes, UA Small (1+) (A) Negative   Appearance     Odor    POCT UA - Microalbumin     Status: Normal   Collection Time: 09/02/20  5:19 PM  Result Value Ref Range   Microalbumin Ur, POC 30 mg/L   Creatinine, POC 200 mg/dL   Albumin/Creatinine Ratio, Urine, POC <30     Objective: General: Patient is awake, alert, and oriented x 3 and in no acute distress.  Integument: Skin is warm, dry and supple bilateral. Nails are tender, long, thickened and dystrophic with subungual debris, consistent with onychomycosis, 1-5 bilateral. No signs of infection. No open lesions or preulcerative lesions present bilateral. Remaining integument unremarkable.  Vasculature:  Dorsalis Pedis pulse 1/4 bilateral. Posterior Tibial pulse  0/4 bilateral.  Capillary fill time <3 sec 1-5 bilateral. Scant hair growth to the level of the digits. Temperature gradient within normal limits. No varicosities present bilateral. No edema present bilateral.   Neurology: The patient has diminished sensation measured with a 5.07/10g Semmes Weinstein Monofilament at all pedal sites bilateral . Vibratory sensation diminished  bilateral with tuning fork.  Musculoskeletal: Asymptomatic bunion, hammertoes, pes planus pedal deformities noted bilateral. Muscular strength 4/5 in all lower extremity muscular groups bilateral without pain on range of motion. No tenderness with calf compression bilateral.  Assessment and Plan: Problem List Items Addressed This Visit   None Visit Diagnoses     Pain due to onychomycosis of nail    -  Primary   PVD (peripheral vascular disease) ([place])       Encounter for comprehensive diabetic foot examination, type 2 diabetes mellitus ([place])          -Examined patient. -Discussed and educated patient on diabetic foot care, especially with  regards to the vascular, neurological and musculoskeletal systems.  -Mechanically debrided all nails 1-5 bilateral using sterile nail nipper and filed with dremel without incident  -Answered all patient questions -Patient to return  in 3 months for at risk foot care -Patient advised to call the office if any problems or questions arise in the meantime.  Landis Martins, DPM

## 2020-09-17 ENCOUNTER — Encounter (HOSPITAL_BASED_OUTPATIENT_CLINIC_OR_DEPARTMENT_OTHER): Payer: Medicare Other | Attending: Internal Medicine | Admitting: Internal Medicine

## 2020-09-17 ENCOUNTER — Other Ambulatory Visit: Payer: Self-pay

## 2020-09-17 DIAGNOSIS — E1122 Type 2 diabetes mellitus with diabetic chronic kidney disease: Secondary | ICD-10-CM | POA: Insufficient documentation

## 2020-09-17 DIAGNOSIS — L97519 Non-pressure chronic ulcer of other part of right foot with unspecified severity: Secondary | ICD-10-CM | POA: Diagnosis not present

## 2020-09-17 DIAGNOSIS — S91301A Unspecified open wound, right foot, initial encounter: Secondary | ICD-10-CM

## 2020-09-17 DIAGNOSIS — N183 Chronic kidney disease, stage 3 unspecified: Secondary | ICD-10-CM | POA: Diagnosis not present

## 2020-09-17 DIAGNOSIS — E1151 Type 2 diabetes mellitus with diabetic peripheral angiopathy without gangrene: Secondary | ICD-10-CM | POA: Diagnosis not present

## 2020-09-17 DIAGNOSIS — I129 Hypertensive chronic kidney disease with stage 1 through stage 4 chronic kidney disease, or unspecified chronic kidney disease: Secondary | ICD-10-CM | POA: Diagnosis not present

## 2020-09-17 DIAGNOSIS — L209 Atopic dermatitis, unspecified: Secondary | ICD-10-CM | POA: Diagnosis not present

## 2020-09-17 DIAGNOSIS — E11621 Type 2 diabetes mellitus with foot ulcer: Secondary | ICD-10-CM | POA: Insufficient documentation

## 2020-09-17 DIAGNOSIS — E119 Type 2 diabetes mellitus without complications: Secondary | ICD-10-CM

## 2020-09-17 DIAGNOSIS — Z87891 Personal history of nicotine dependence: Secondary | ICD-10-CM | POA: Insufficient documentation

## 2020-09-17 NOTE — Progress Notes (Addendum)
Leah Wiggins, Leah Wiggins ([id number]) Visit Report for 09/17/2020 Allergy List Details Patient Name: Date of Service: Leah Wiggins, Leah Wiggins 09/17/2020 2:45 PM Medical Record Number: [id number] Patient Account Number: [id number] Date of Birth/Sex: Treating RN: 1926/08/30 (87 y.o. Female) Lorrin Jackson Primary Care Lynsi Dooner: Glendale Chard Other Clinician: Referring Arlon Bleier: Treating Robet Crutchfield/Extender: Tawni Levy Weeks in Treatment: 0 Allergies Active Allergies Bactrim penicillin Allergy Notes Electronic Signature(s) Signed: 09/17/2020 6:01:46 PM By: Lorrin Jackson Entered By: Lorrin Jackson on 09/17/2020 15:17:59 -------------------------------------------------------------------------------- Arrival Information Details Patient Name: Date of Service: Leah Wiggins, Leah B. 09/17/2020 2:45 PM Medical Record Number: [id number] Patient Account Number: [id number] Date of Birth/Sex: Treating RN: 13-Oct-1926 (87 y.o. Female) Lorrin Jackson Primary Care Turner Baillie: Glendale Chard Other Clinician: Referring Maleiyah Releford: Treating Gloria Lambertson/Extender: Alcario Drought in Treatment: 0 Visit Information Patient Arrived: Ambulatory Arrival Time: 15:13 Accompanied By: daughter Transfer Assistance: None Patient Identification Verified: Yes Secondary Verification Process Completed: Yes Patient Requires Transmission-Based Precautions: No Patient Has Alerts: Yes Patient Alerts: Patient on Blood Thinner Electronic Signature(s) Signed: 09/17/2020 6:01:46 PM By: Lorrin Jackson Entered By: Lorrin Jackson on 09/17/2020 15:16:14 -------------------------------------------------------------------------------- Clinic Level of Care Assessment Details Patient Name: Date of Service: Leah Wiggins, Leah Wiggins 09/17/2020 2:45 PM Medical Record Number: [id number] Patient Account Number: [id number] Date of Birth/Sex: Treating RN: April 05, 1926 (87 y.o. Female) Lorrin Jackson Primary  Care Nakyla Bracco: Glendale Chard Other Clinician: Referring Tyde Lamison: Treating Nishtha Raider/Extender: Alcario Drought in Treatment: 0 Clinic Level of Care Assessment Items TOOL 2 Quantity Score X- 1 0 Use when only an EandM is performed on the INITIAL visit ASSESSMENTS - Nursing Assessment / Reassessment X- 1 20 General Physical Exam (combine w/ comprehensive assessment (listed just below) when performed on new pt. evals) X- 1 25 Comprehensive Assessment (HX, ROS, Risk Assessments, Wounds Hx, etc.) ASSESSMENTS - Wound and Skin A ssessment / Reassessment X - Simple Wound Assessment / Reassessment - one wound 1 5 []  - 0 Complex Wound Assessment / Reassessment - multiple wounds []  - 0 Dermatologic / Skin Assessment (not related to wound area) ASSESSMENTS - Ostomy and/or Continence Assessment and Care []  - 0 Incontinence Assessment and Management []  - 0 Ostomy Care Assessment and Management (repouching, etc.) PROCESS - Coordination of Care []  - 0 Simple Patient / Family Education for ongoing care X- 1 20 Complex (extensive) Patient / Family Education for ongoing care X- 1 10 Staff obtains Programmer, systems, Records, T Results / Process Orders est []  - 0 Staff telephones HHA, Nursing Homes / Clarify orders / etc []  - 0 Routine Transfer to another Facility (non-emergent condition) []  - 0 Routine Hospital Admission (non-emergent condition) []  - 0 New Admissions / Biomedical engineer / Ordering NPWT Apligraf, etc. , []  - 0 Emergency Hospital Admission (emergent condition) []  - 0 Simple Discharge Coordination []  - 0 Complex (extensive) Discharge Coordination PROCESS - Special Needs []  - 0 Pediatric / Minor Patient Management []  - 0 Isolation Patient Management []  - 0 Hearing / Language / Visual special needs []  - 0 Assessment of Community assistance (transportation, D/C planning, etc.) []  - 0 Additional assistance / Altered mentation []  - 0 Support  Surface(s) Assessment (bed, cushion, seat, etc.) INTERVENTIONS - Wound Cleansing / Measurement X- 1 5 Wound Imaging (photographs - any number of wounds) []  - 0 Wound Tracing (instead of photographs) X- 1 5 Simple Wound Measurement - one wound []  - 0 Complex Wound Measurement - multiple wounds X- 1 5 Simple Wound Cleansing - one wound []  - 0  N/A Exudate Type: amber N/A N/A Exudate Color: Distinct, outline attached N/A N/A Wound Margin: Large (67-100%) N/A N/A Granulation Amount: Red N/A N/A Granulation Quality: None Present (0%) N/A N/A Necrotic Amount: Fat Layer (Subcutaneous Tissue): Yes N/A N/A Exposed Structures: Fascia: No Tendon: No Muscle: No Joint: No Bone: No None N/A N/A Epithelialization: Treatment Notes Wound #1 (Foot) Wound Laterality: Dorsal, Right Cleanser Soap and Water Discharge Instruction: May shower and wash wound with dial antibacterial soap and water prior to dressing change. Peri-Wound Care Triamcinolone 15 (g) Discharge Instruction: Use triamcinolone 15 (g) as directed Topical Primary Dressing Promogran Prisma Matrix, 4.34 (sq in) (silver collagen) Discharge Instruction: Moisten collagen with saline Secondary Dressing Woven Gauze Sponges 2x2 in Discharge Instruction: Apply over primary dressing as directed. Secured With [employer], 4.5x3.1 (in/yd) Discharge Instruction: Secure with Kerlix as directed. Paper Tape, 2x10 (in/yd) Discharge Instruction:  Secure dressing with tape as directed. Compression Wrap Compression Stockings Add-Ons Electronic Signature(s) Signed: 09/18/2020 2:20:46 PM By: Kalman Shan DO Signed: 09/21/2020 5:08:20 PM By: Rhae Hammock RN Entered By: Kalman Shan on 09/18/2020 14:13:37 -------------------------------------------------------------------------------- Multi-Disciplinary Care Plan Details Patient Name: Date of Service: Leah Wiggins, Ting B. 09/17/2020 2:45 PM Medical Record Number: [id number] Patient Account Number: [id number] Date of Birth/Sex: Treating RN: [birth year] (87 y.o. Female) Lorrin Jackson Primary Care Kenleigh Toback: Glendale Chard Other Clinician: Referring Ahmani Daoud: Treating Areeba Sulser/Extender: Alcario Drought in Treatment: 0 Active Inactive Orientation to the Wound Care Program Nursing Diagnoses: Knowledge deficit related to the wound healing center program Goals: Patient/caregiver will verbalize understanding of the [facility] Program Date Initiated: 09/17/2020 Target Resolution Date: 10/15/2020 Goal Status: Active Interventions: Provide education on orientation to the wound center Notes: Wound/Skin Impairment Nursing Diagnoses: Impaired tissue integrity Goals: Patient/caregiver will verbalize understanding of skin care regimen Date Initiated: 09/17/2020 Target Resolution Date: 10/15/2020 Goal Status: Active Ulcer/skin breakdown will have a volume reduction of 30% by week 4 Date Initiated: 09/17/2020 Target Resolution Date: 10/15/2020 Goal Status: Active Interventions: Assess patient/caregiver ability to obtain necessary supplies Assess patient/caregiver ability to perform ulcer/skin care regimen upon admission and as needed Assess ulceration(s) every visit Provide education on ulcer and skin care Treatment Activities: Skin care regimen initiated : 09/17/2020 Topical wound management initiated : 09/17/2020 Notes: Electronic  Signature(s) Signed: 09/17/2020 6:01:46 PM By: Lorrin Jackson Entered By: Lorrin Jackson on 09/17/2020 15:59:31 -------------------------------------------------------------------------------- Pain Assessment Details Patient Name: Date of Service: Leah Wiggins 09/17/2020 2:45 PM Medical Record Number: [id number] Patient Account Number: [id number] Date of Birth/Sex: Treating RN: [birth year] (87 y.o. Female) Lorrin Jackson Primary Care Kreed Kauffman: Glendale Chard Other Clinician: Referring Jermy Couper: Treating Eyden Dobie/Extender: Alcario Drought in Treatment: 0 Active Problems Location of Pain Severity and Description of Pain Patient Has Paino No Site Locations Pain Management and Medication Current Pain Management: Electronic Signature(s) Signed: 09/17/2020 6:01:46 PM By: Lorrin Jackson Entered By: Lorrin Jackson on 09/17/2020 15:40:20 -------------------------------------------------------------------------------- Patient/Caregiver Education Details Patient Name: Date of Service: Leah Wiggins 7/14/2022andnbsp2:45 PM Medical Record Number: [id number] Patient Account Number: [id number] Date of Birth/Gender: Treating RN: Feb 03, 1927 (87 y.o. Female) Lorrin Jackson Primary Care Physician: Glendale Chard Other Clinician: Referring Physician: Treating Physician/Extender: Alcario Drought in Treatment: 0 Education Assessment Education Provided To: Patient Education Topics Provided Welcome T [facility] [place] [place] [facility]: o Handouts: Welcome T [facility] o Methods: Explain/Verbal, Printed Responses: State content correctly Wound/Skin Impairment: Methods: Explain/Verbal, Printed Responses: State content correctly Electronic Signature(s) Signed: 09/17/2020 6:01:46 PM By: Lorrin Jackson  Leah Wiggins, Leah Wiggins ([id number]) Visit Report for 09/17/2020 Allergy List Details Patient Name: Date of Service: Leah Wiggins, Leah Wiggins 09/17/2020 2:45 PM Medical Record Number: [id number] Patient Account Number: [id number] Date of Birth/Sex: Treating RN: 1926/08/30 (87 y.o. Female) Lorrin Jackson Primary Care Lynsi Dooner: Glendale Chard Other Clinician: Referring Arlon Bleier: Treating Robet Crutchfield/Extender: Tawni Levy Weeks in Treatment: 0 Allergies Active Allergies Bactrim penicillin Allergy Notes Electronic Signature(s) Signed: 09/17/2020 6:01:46 PM By: Lorrin Jackson Entered By: Lorrin Jackson on 09/17/2020 15:17:59 -------------------------------------------------------------------------------- Arrival Information Details Patient Name: Date of Service: Leah Wiggins, Leah B. 09/17/2020 2:45 PM Medical Record Number: [id number] Patient Account Number: [id number] Date of Birth/Sex: Treating RN: 13-Oct-1926 (87 y.o. Female) Lorrin Jackson Primary Care Turner Baillie: Glendale Chard Other Clinician: Referring Maleiyah Releford: Treating Gloria Lambertson/Extender: Alcario Drought in Treatment: 0 Visit Information Patient Arrived: Ambulatory Arrival Time: 15:13 Accompanied By: daughter Transfer Assistance: None Patient Identification Verified: Yes Secondary Verification Process Completed: Yes Patient Requires Transmission-Based Precautions: No Patient Has Alerts: Yes Patient Alerts: Patient on Blood Thinner Electronic Signature(s) Signed: 09/17/2020 6:01:46 PM By: Lorrin Jackson Entered By: Lorrin Jackson on 09/17/2020 15:16:14 -------------------------------------------------------------------------------- Clinic Level of Care Assessment Details Patient Name: Date of Service: Leah Wiggins, Leah Wiggins 09/17/2020 2:45 PM Medical Record Number: [id number] Patient Account Number: [id number] Date of Birth/Sex: Treating RN: April 05, 1926 (87 y.o. Female) Lorrin Jackson Primary  Care Nakyla Bracco: Glendale Chard Other Clinician: Referring Tyde Lamison: Treating Nishtha Raider/Extender: Alcario Drought in Treatment: 0 Clinic Level of Care Assessment Items TOOL 2 Quantity Score X- 1 0 Use when only an EandM is performed on the INITIAL visit ASSESSMENTS - Nursing Assessment / Reassessment X- 1 20 General Physical Exam (combine w/ comprehensive assessment (listed just below) when performed on new pt. evals) X- 1 25 Comprehensive Assessment (HX, ROS, Risk Assessments, Wounds Hx, etc.) ASSESSMENTS - Wound and Skin A ssessment / Reassessment X - Simple Wound Assessment / Reassessment - one wound 1 5 []  - 0 Complex Wound Assessment / Reassessment - multiple wounds []  - 0 Dermatologic / Skin Assessment (not related to wound area) ASSESSMENTS - Ostomy and/or Continence Assessment and Care []  - 0 Incontinence Assessment and Management []  - 0 Ostomy Care Assessment and Management (repouching, etc.) PROCESS - Coordination of Care []  - 0 Simple Patient / Family Education for ongoing care X- 1 20 Complex (extensive) Patient / Family Education for ongoing care X- 1 10 Staff obtains Programmer, systems, Records, T Results / Process Orders est []  - 0 Staff telephones HHA, Nursing Homes / Clarify orders / etc []  - 0 Routine Transfer to another Facility (non-emergent condition) []  - 0 Routine Hospital Admission (non-emergent condition) []  - 0 New Admissions / Biomedical engineer / Ordering NPWT Apligraf, etc. , []  - 0 Emergency Hospital Admission (emergent condition) []  - 0 Simple Discharge Coordination []  - 0 Complex (extensive) Discharge Coordination PROCESS - Special Needs []  - 0 Pediatric / Minor Patient Management []  - 0 Isolation Patient Management []  - 0 Hearing / Language / Visual special needs []  - 0 Assessment of Community assistance (transportation, D/C planning, etc.) []  - 0 Additional assistance / Altered mentation []  - 0 Support  Surface(s) Assessment (bed, cushion, seat, etc.) INTERVENTIONS - Wound Cleansing / Measurement X- 1 5 Wound Imaging (photographs - any number of wounds) []  - 0 Wound Tracing (instead of photographs) X- 1 5 Simple Wound Measurement - one wound []  - 0 Complex Wound Measurement - multiple wounds X- 1 5 Simple Wound Cleansing - one wound []  - 0  Leah Wiggins, Leah Wiggins ([id number]) Visit Report for 09/17/2020 Allergy List Details Patient Name: Date of Service: Leah Wiggins, Leah Wiggins 09/17/2020 2:45 PM Medical Record Number: [id number] Patient Account Number: [id number] Date of Birth/Sex: Treating RN: 1926/08/30 (87 y.o. Female) Lorrin Jackson Primary Care Lynsi Dooner: Glendale Chard Other Clinician: Referring Arlon Bleier: Treating Robet Crutchfield/Extender: Tawni Levy Weeks in Treatment: 0 Allergies Active Allergies Bactrim penicillin Allergy Notes Electronic Signature(s) Signed: 09/17/2020 6:01:46 PM By: Lorrin Jackson Entered By: Lorrin Jackson on 09/17/2020 15:17:59 -------------------------------------------------------------------------------- Arrival Information Details Patient Name: Date of Service: Leah Wiggins, Leah B. 09/17/2020 2:45 PM Medical Record Number: [id number] Patient Account Number: [id number] Date of Birth/Sex: Treating RN: 13-Oct-1926 (87 y.o. Female) Lorrin Jackson Primary Care Turner Baillie: Glendale Chard Other Clinician: Referring Maleiyah Releford: Treating Gloria Lambertson/Extender: Alcario Drought in Treatment: 0 Visit Information Patient Arrived: Ambulatory Arrival Time: 15:13 Accompanied By: daughter Transfer Assistance: None Patient Identification Verified: Yes Secondary Verification Process Completed: Yes Patient Requires Transmission-Based Precautions: No Patient Has Alerts: Yes Patient Alerts: Patient on Blood Thinner Electronic Signature(s) Signed: 09/17/2020 6:01:46 PM By: Lorrin Jackson Entered By: Lorrin Jackson on 09/17/2020 15:16:14 -------------------------------------------------------------------------------- Clinic Level of Care Assessment Details Patient Name: Date of Service: Leah Wiggins, Leah Wiggins 09/17/2020 2:45 PM Medical Record Number: [id number] Patient Account Number: [id number] Date of Birth/Sex: Treating RN: April 05, 1926 (87 y.o. Female) Lorrin Jackson Primary  Care Nakyla Bracco: Glendale Chard Other Clinician: Referring Tyde Lamison: Treating Nishtha Raider/Extender: Alcario Drought in Treatment: 0 Clinic Level of Care Assessment Items TOOL 2 Quantity Score X- 1 0 Use when only an EandM is performed on the INITIAL visit ASSESSMENTS - Nursing Assessment / Reassessment X- 1 20 General Physical Exam (combine w/ comprehensive assessment (listed just below) when performed on new pt. evals) X- 1 25 Comprehensive Assessment (HX, ROS, Risk Assessments, Wounds Hx, etc.) ASSESSMENTS - Wound and Skin A ssessment / Reassessment X - Simple Wound Assessment / Reassessment - one wound 1 5 []  - 0 Complex Wound Assessment / Reassessment - multiple wounds []  - 0 Dermatologic / Skin Assessment (not related to wound area) ASSESSMENTS - Ostomy and/or Continence Assessment and Care []  - 0 Incontinence Assessment and Management []  - 0 Ostomy Care Assessment and Management (repouching, etc.) PROCESS - Coordination of Care []  - 0 Simple Patient / Family Education for ongoing care X- 1 20 Complex (extensive) Patient / Family Education for ongoing care X- 1 10 Staff obtains Programmer, systems, Records, T Results / Process Orders est []  - 0 Staff telephones HHA, Nursing Homes / Clarify orders / etc []  - 0 Routine Transfer to another Facility (non-emergent condition) []  - 0 Routine Hospital Admission (non-emergent condition) []  - 0 New Admissions / Biomedical engineer / Ordering NPWT Apligraf, etc. , []  - 0 Emergency Hospital Admission (emergent condition) []  - 0 Simple Discharge Coordination []  - 0 Complex (extensive) Discharge Coordination PROCESS - Special Needs []  - 0 Pediatric / Minor Patient Management []  - 0 Isolation Patient Management []  - 0 Hearing / Language / Visual special needs []  - 0 Assessment of Community assistance (transportation, D/C planning, etc.) []  - 0 Additional assistance / Altered mentation []  - 0 Support  Surface(s) Assessment (bed, cushion, seat, etc.) INTERVENTIONS - Wound Cleansing / Measurement X- 1 5 Wound Imaging (photographs - any number of wounds) []  - 0 Wound Tracing (instead of photographs) X- 1 5 Simple Wound Measurement - one wound []  - 0 Complex Wound Measurement - multiple wounds X- 1 5 Simple Wound Cleansing - one wound []  - 0

## 2020-09-17 NOTE — Progress Notes (Signed)
Leah Wiggins ([id number]) Visit Report for 09/17/2020 Abuse/Suicide Risk Screen Details Patient Name: Date of Service: Leah Wiggins, Leah Wiggins 09/17/2020 2:45 PM Medical Record Number: [id number] Patient Account Number: [id number] Date of Birth/Sex: Treating RN: 06-26-1926 (87 y.o. Leah Wiggins Primary Care Naveh Rickles: Dorothyann Peng Other Clinician: Referring Katia Hannen: Treating Rumaldo Difatta/Extender: Tona Sensing in Treatment: 0 Abuse/Suicide Risk Screen Items Answer ABUSE RISK SCREEN: Has anyone close to you tried to hurt or harm you recentlyo No Do you feel uncomfortable with anyone in your familyo No Has anyone forced you do things that you didnt want to doo No Electronic Signature(s) Signed: 09/17/2020 6:01:46 PM By: Antonieta Iba Entered By: Antonieta Iba on 09/17/2020 15:23:48 -------------------------------------------------------------------------------- Activities of Daily Living Details Patient Name: Date of Service: Leah, Wiggins 09/17/2020 2:45 PM Medical Record Number: [id number] Patient Account Number: [id number] Date of Birth/Sex: Treating RN: 1926-10-30 (87 y.o. Leah Wiggins Primary Care Dakarri Kessinger: Dorothyann Peng Other Clinician: Referring Nelda Luckey: Treating Neeka Urista/Extender: Tona Sensing in Treatment: 0 Activities of Daily Living Items Answer Activities of Daily Living (Please select one for each item) Drive Automobile Need Assistance T Medications ake Completely Able Use T elephone Completely Able Care for Appearance Completely Able Use T oilet Completely Able Bath / Shower Completely Able Dress Self Completely Able Feed Self Completely Able Walk Completely Able Get In / Out Bed Completely Able Housework Need Assistance Prepare Meals Completely Able Handle Money Need Assistance Shop for Self Need Assistance Electronic Signature(s) Signed: 09/17/2020 6:01:46 PM By: Antonieta Iba Entered By: Antonieta Iba on 09/17/2020 15:25:31 -------------------------------------------------------------------------------- Education Screening Details Patient Name: Date of Service: Leah Wiggins, Joylene B. 09/17/2020 2:45 PM Medical Record Number: [id number] Patient Account Number: [id number] Date of Birth/Sex: Treating RN: August 27, 1926 (87 y.o. Leah Wiggins Primary Care Brailyn Delman: Dorothyann Peng Other Clinician: Referring Rukia Mcgillivray: Treating Christiona Siddique/Extender: Tona Sensing in Treatment: 0 Primary Learner Assessed: Patient Learning Preferences/Education Level/Primary Language Learning Preference: Explanation, Demonstration, Printed Material Highest Education Level: High School Preferred Language: English Cognitive Barrier Language Barrier: No Translator Needed: No Memory Deficit: No Emotional Barrier: No Cultural/Religious Beliefs Affecting Medical Care: No Physical Barrier Impaired Vision: Yes Glasses Impaired Hearing: Yes Hearing Aid Knowledge/Comprehension Knowledge Level: Medium Comprehension Level: Medium Ability to understand written instructions: High Ability to understand verbal instructions: Medium Motivation Anxiety Level: Calm Cooperation: Cooperative Education Importance: Acknowledges Need Interest in Health Problems: Asks Questions Perception: Coherent Willingness to Engage in Self-Management High Activities: Readiness to Engage in Self-Management High Activities: Electronic Signature(s) Signed: 09/17/2020 6:01:46 PM By: Antonieta Iba Entered By: Antonieta Iba on 09/17/2020 15:26:05 -------------------------------------------------------------------------------- Fall Risk Assessment Details Patient Name: Date of Service: Leah Wiggins, [name] 09/17/2020 2:45 PM Medical Record Number: [id number] Patient Account Number: [id number] Date of Birth/Sex: Treating RN: 09/18/1926 (87 y.o. Leah Wiggins Primary Care  Clairessa Boulet: Dorothyann Peng Other Clinician: Referring Zeenat Jeanbaptiste: Treating Vane Yapp/Extender: Tona Sensing in Treatment: 0 Fall Risk Assessment Items Have you had 2 or more falls in the last 12 monthso 0 No Have you had any fall that resulted in injury in the last 12 monthso 0 No FALLS RISK SCREEN History of falling - immediate or within 3 months 0 No Secondary diagnosis (Do you have 2 or more medical diagnoseso) 0 No Ambulatory aid None/bed rest/wheelchair/nurse 0 Yes Crutches/cane/walker 0 No Furniture 0 No Intravenous therapy Access/Saline/Heparin Lock 0 No Gait/Transferring Normal/ bed rest/ wheelchair 0 Yes Weak (short steps with or without shuffle, stooped but able to  lift head while walking, may seek 0 No support from furniture) Impaired (short steps with shuffle, may have difficulty arising from chair, head down, impaired 0 No balance) Mental Status Oriented to own ability 0 Yes Electronic Signature(s) Signed: 09/17/2020 6:01:46 PM By: Antonieta Iba Entered By: Antonieta Iba on 09/17/2020 15:24:07 -------------------------------------------------------------------------------- Foot Assessment Details Patient Name: Date of Service: Leah Wiggins, Leah B. 09/17/2020 2:45 PM Medical Record Number: [id number] Patient Account Number: [id number] Date of Birth/Sex: Treating RN: 1926/04/28 (87 y.o. Leah Wiggins Primary Care Sequoia Mincey: Dorothyann Peng Other Clinician: Referring Tabari Volkert: Treating Erle Guster/Extender: Tona Sensing in Treatment: 0 Foot Assessment Items Site Locations + = Sensation present, - = Sensation absent, C = Callus, U = Ulcer R = Redness, W = Warmth, M = Maceration, PU = Pre-ulcerative lesion F = Fissure, S = Swelling, D = Dryness Assessment Right: Left: Other Deformity: No No Prior Foot Ulcer: No No Prior Amputation: No No Charcot Joint: No No Ambulatory Status: Ambulatory Without Help Gait:  Steady Electronic Signature(s) Signed: 09/17/2020 6:01:46 PM By: Antonieta Iba Entered By: Antonieta Iba on 09/17/2020 15:28:39 -------------------------------------------------------------------------------- Nutrition Risk Screening Details Patient Name: Date of Service: Leah, Wiggins 09/17/2020 2:45 PM Medical Record Number: [id number] Patient Account Number: [id number] Date of Birth/Sex: Treating RN: March 08, 1926 (87 y.o. Leah Wiggins Primary Care Keondra Haydu: Dorothyann Peng Other Clinician: Referring Talor Cheema: Treating Marquese Burkland/Extender: Tona Sensing in Treatment: 0 Height (in): 64 Weight (lbs): 174 Body Mass Index (BMI): 29.9 Nutrition Risk Screening Items Score Screening NUTRITION RISK SCREEN: I have an illness or condition that made me change the kind and/or amount of food I eat 0 No I eat fewer than two meals per day 0 No I eat few fruits and vegetables, or milk products 0 No I have three or more drinks of beer, liquor or wine almost every day 0 No I have tooth or mouth problems that make it hard for me to eat 0 No I don't always have enough money to buy the food I need 0 No I eat alone most of the time 0 No I take three or more different prescribed or over-the-counter drugs a day 1 Yes Without wanting to, I have lost or gained 10 pounds in the last six months 0 No I am not always physically able to shop, cook and/or feed myself 0 No Nutrition Protocols Good Risk Protocol 0 No interventions needed Moderate Risk Protocol High Risk Proctocol Risk Level: Good Risk Score: 1 Electronic Signature(s) Signed: 09/17/2020 6:01:46 PM By: Antonieta Iba Entered By: Antonieta Iba on 09/17/2020 15:24:27

## 2020-09-18 NOTE — Progress Notes (Signed)
Leah Wiggins, Leah Wiggins ([id number]) Visit Report for 09/17/2020 Chief Complaint Document Details Patient Name: Date of Service: ANYLAH, SCHEIB 09/17/2020 2:45 PM Medical Record Number: [id number] Patient Account Number: [id number] Date of Birth/Sex: Treating RN: 1926/12/03 (87 y.o. Female) Rhae Hammock Primary Care Provider: Glendale Chard Other Clinician: Referring Provider: Treating Provider/Extender: Alcario Drought in Treatment: 0 Information Obtained from: Patient Chief Complaint Right foot wound Electronic Signature(s) Signed: 09/18/2020 2:20:46 PM By: Kalman Shan DO Entered By: Kalman Shan on 09/18/2020 14:13:54 -------------------------------------------------------------------------------- HPI Details Patient Name: Date of Service: Leah Wiggins, Leah B. 09/17/2020 2:45 PM Medical Record Number: [id number] Patient Account Number: [id number] Date of Birth/Sex: Treating RN: [birth year] (87 y.o. Female) Rhae Hammock Primary Care Provider: Glendale Chard Other Clinician: Referring Provider: Treating Provider/Extender: Alcario Drought in Treatment: 0 History of Present Illness HPI Description: Admission 7/14 Ms. [name] is a 87 year old female with a past medical history of type 2 diabetes on oral agents, essential hypertension and peripheral arterial disease that presents to our clinic for a wound to the top of her right foot. Patient has had issues with wounds to her right foot on and off for 5 years. The current wound started 2 months ago and has been slowly improving. She has tried betamethasone cream and triamcinolone cream with benefit. She is currently using triamcinolone cream. She also soaks her feet every 2 weeks. She has been followed by dermatology for this issue. She states she has been diagnosed with atopic dermatitis. She currently denies signs of infection. Electronic Signature(s) Signed:  09/18/2020 2:20:46 PM By: Kalman Shan DO Entered By: Kalman Shan on 09/18/2020 14:14:50 -------------------------------------------------------------------------------- Physical Exam Details Patient Name: Date of Service: Leah Wiggins, Leah Wiggins 09/17/2020 2:45 PM Medical Record Number: [id number] Patient Account Number: [id number] Date of Birth/Sex: Treating RN: Oct 18, 1926 (87 y.o. Female) Rhae Hammock Primary Care Provider: Glendale Chard Other Clinician: Referring Provider: Treating Provider/Extender: Alcario Drought in Treatment: 0 Constitutional respirations regular, non-labored and within target range for patient.. Cardiovascular 2+ dorsalis pedis/posterior tibialis pulses. Psychiatric pleasant and cooperative. Notes Very small wounds to the dorsal aspect of her right foot that is limited to skin breakdown. Flaking noted on the dorsal aspect throughout her right foot Electronic Signature(s) Signed: 09/18/2020 2:20:46 PM By: Kalman Shan DO Entered By: Kalman Shan on 09/18/2020 14:15:39 -------------------------------------------------------------------------------- Physician Orders Details Patient Name: Date of Service: Leah Wiggins, Leah B. 09/17/2020 2:45 PM Medical Record Number: [id number] Patient Account Number: [id number] Date of Birth/Sex: Treating RN: 11-02-1926 (87 y.o. Female) Lorrin Jackson Primary Care Provider: Glendale Chard Other Clinician: Referring Provider: Treating Provider/Extender: Alcario Drought in Treatment: 0 Verbal / Phone Orders: No Diagnosis Coding ICD-10 Coding Code Description B63.893T Unspecified open wound, right foot, initial encounter E11.9 Type 2 diabetes mellitus without complications D42.8 Atopic dermatitis, unspecified Follow-up Appointments ppointment in 1 week. - Dr. Heber [place] Return A Bathing/ Shower/ Hygiene May shower and wash wound with soap and water. - Hold off  on soaking feet for now Edema Control - Lymphedema / SCD / Other Elevate legs to the level of the heart or above for 30 minutes daily and/or when sitting, a frequency of: Avoid standing for long periods of time. Additional Orders / Instructions Follow Nutritious Diet Wound Treatment Wound #1 - Foot Wound Laterality: Dorsal, Right Cleanser: Soap and Water 1 x Per JGO/11 Days Discharge Instructions: May shower and wash wound with dial antibacterial soap and water prior to dressing change. Peri-Wound Care: Triamcinolone 15 (  g) 1 x Per Day/15 Days Discharge Instructions: Use triamcinolone 15 (g) as directed Prim Dressing: Promogran Prisma Matrix, 4.34 (sq in) (silver collagen) ary 1 x Per Day/15 Days Discharge Instructions: Moisten collagen with saline Secondary Dressing: Woven Gauze Sponges 2x2 in 1 x Per Day/15 Days Discharge Instructions: Apply over primary dressing as directed. Secured With: [employer], 4.5x3.1 (in/yd) 1 x Per Day/15 Days Discharge Instructions: Secure with Kerlix as directed. Secured With: Paper Tape, 2x10 (in/yd) 1 x Per Day/15 Days Discharge Instructions: Secure dressing with tape as directed. Electronic Signature(s) Signed: 09/18/2020 2:20:46 PM By: Kalman Shan DO Previous Signature: 09/17/2020 6:01:46 PM Version By: Lorrin Jackson Entered By: Kalman Shan on 09/18/2020 14:16:43 -------------------------------------------------------------------------------- Problem List Details Patient Name: Date of Service: Leah Wiggins, Leah B. 09/17/2020 2:45 PM Medical Record Number: [id number] Patient Account Number: [id number] Date of Birth/Sex: Treating RN: [birth year] (87 y.o. Female) Rhae Hammock Primary Care Provider: Glendale Chard Other Clinician: Referring Provider: Treating Provider/Extender: Alcario Drought in Treatment: 0 Active Problems ICD-10 Encounter Code Description Active Date MDM Diagnosis S91.301A  Unspecified open wound, right foot, initial encounter 09/18/2020 No Yes E11.9 Type 2 diabetes mellitus without complications 09/19/9676 No Yes L20.9 Atopic dermatitis, unspecified 09/18/2020 No Yes Inactive Problems Resolved Problems Electronic Signature(s) Signed: 09/18/2020 2:20:46 PM By: Kalman Shan DO Entered By: Kalman Shan on 09/18/2020 14:13:29 -------------------------------------------------------------------------------- Progress Note Details Patient Name: Date of Service: Leah Wiggins, Leah B. 09/17/2020 2:45 PM Medical Record Number: [id number] Patient Account Number: [id number] Date of Birth/Sex: Treating RN: [birth year] (87 y.o. Female) Rhae Hammock Primary Care Provider: Glendale Chard Other Clinician: Referring Provider: Treating Provider/Extender: Alcario Drought in Treatment: 0 Subjective Chief Complaint Information obtained from Patient Right foot wound History of Present Illness (HPI) Admission 7/14 Ms. Rajni Holsworth is a 87 year old female with a past medical history of type 2 diabetes on oral agents, essential hypertension and peripheral arterial disease that presents to our clinic for a wound to the top of her right foot. Patient has had issues with wounds to her right foot on and off for 5 years. The current wound started 2 months ago and has been slowly improving. She has tried betamethasone cream and triamcinolone cream with benefit. She is currently using triamcinolone cream. She also soaks her feet every 2 weeks. She has been followed by dermatology for this issue. She states she has been diagnosed with atopic dermatitis. She currently denies signs of infection. Patient History Information obtained from Patient. Allergies Bactrim, penicillin Family History Cancer - Siblings, Diabetes - Mother, Heart Disease - Mother, Hypertension - Mother, No family history of Hereditary Spherocytosis, Kidney Disease, Lung Disease,  Seizures, Stroke, Thyroid Problems, Tuberculosis. Social History Former smoker, Marital Status - Widowed, Alcohol Use - Never, Drug Use - No History, Caffeine Use - Moderate - Soda, coffee. Medical History Eyes Patient has history of Cataracts - removed Respiratory Patient has history of Chronic Obstructive Pulmonary Disease (COPD) Cardiovascular Patient has history of Hypertension, Peripheral Arterial Disease Endocrine Patient has history of Type II Diabetes Musculoskeletal Patient has history of Osteoarthritis - Back, Shoulder Oncologic Denies history of Received Chemotherapy, Received Radiation Patient is treated with Oral Agents. Blood sugar is tested. Medical A Surgical History Notes nd Ear/Nose/Mouth/Throat Hearing loss Genitourinary CKD stage 3 Oncologic Breast Cancer Review of Systems (ROS) Eyes Complains or has symptoms of Glasses / Contacts. Gastrointestinal Denies complaints or symptoms of Frequent diarrhea, Nausea, Vomiting. Genitourinary Denies complaints or symptoms of Frequent urination. Integumentary (Skin) Complains or  has symptoms of Wounds. Neurologic Denies complaints or symptoms of Numbness/parasthesias. Psychiatric Denies complaints or symptoms of Claustrophobia, Suicidal. Objective Constitutional respirations regular, non-labored and within target range for patient.. Vitals Time Taken: 3:10 PM, Height: 64 in, Source: Stated, Weight: 174 lbs, Source: Stated, BMI: 29.9, Temperature: 99.1 F, Pulse: 83 bpm, Respiratory Rate: 18 breaths/min, Blood Pressure: 157/78 mmHg. Cardiovascular 2+ dorsalis pedis/posterior tibialis pulses. Psychiatric pleasant and cooperative. General Notes: Very small wounds to the dorsal aspect of her right foot that is limited to skin breakdown. Flaking noted on the dorsal aspect throughout her right foot Integumentary (Hair, Skin) Wound #1 status is Open. Original cause of wound was Other Lesion. The date acquired was:  08/03/2020. The wound is located on the Right,Dorsal Foot. The wound measures 1cm length x 0.3cm width x 0.1cm depth; 0.236cm^2 area and 0.024cm^3 volume. There is Fat Layer (Subcutaneous Tissue) exposed. There is no tunneling or undermining noted. There is a medium amount of serous drainage noted. The wound margin is distinct with the outline attached to the wound base. There is large (67-100%) red granulation within the wound bed. There is no necrotic tissue within the wound bed. Assessment Active Problems ICD-10 Unspecified open wound, right foot, initial encounter Type 2 diabetes mellitus without complications Atopic dermatitis, unspecified Patient presents with a 14-month history of nonhealing wounds to the dorsal aspect of her right foot. The overall impression is this is a dermatological issue. This could be dermatitis related to her diabetes. She has flaking throughout her right foot. She has very small areas of skin breakdown. I recommended continuing the triamcinolone cream since this has helped. We will add collagen to the open areas to see if this will help as well. I will see her back in 1 week. I recommended not soaking her feet for now. Plan Follow-up Appointments: Return Appointment in 1 week. - Dr. Heber [place] Bathing/ Shower/ Hygiene: May shower and wash wound with soap and water. - Hold off on soaking feet for now Edema Control - Lymphedema / SCD / Other: Elevate legs to the level of the heart or above for 30 minutes daily and/or when sitting, a frequency of: Avoid standing for long periods of time. Additional Orders / Instructions: Follow Nutritious Diet WOUND #1: - Foot Wound Laterality: Dorsal, Right Cleanser: Soap and Water 1 x Per HEN/27 Days Discharge Instructions: May shower and wash wound with dial antibacterial soap and water prior to dressing change. Peri-Wound Care: Triamcinolone 15 (g) 1 x Per Day/15 Days Discharge Instructions: Use triamcinolone 15 (g) as  directed Prim Dressing: Promogran Prisma Matrix, 4.34 (sq in) (silver collagen) 1 x Per Day/15 Days ary Discharge Instructions: Moisten collagen with saline Secondary Dressing: Woven Gauze Sponges 2x2 in 1 x Per Day/15 Days Discharge Instructions: Apply over primary dressing as directed. Secured With: [employer], 4.5x3.1 (in/yd) 1 x Per Day/15 Days Discharge Instructions: Secure with Kerlix as directed. Secured With: Paper T ape, 2x10 (in/yd) 1 x Per Day/15 Days Discharge Instructions: Secure dressing with tape as directed. 1. Triamcinolone cream 2. Collagen 3. Follow-up in 1 week Electronic Signature(s) Signed: 09/18/2020 2:20:46 PM By: Kalman Shan DO Entered By: Kalman Shan on 09/18/2020 14:19:42 -------------------------------------------------------------------------------- HxROS Details Patient Name: Date of Service: Leah Wiggins, Leah B. 09/17/2020 2:45 PM Medical Record Number: [id number] Patient Account Number: [id number] Date of Birth/Sex: Treating RN: [birth year] (87 y.o. Female) Lorrin Jackson Primary Care Provider: Glendale Chard Other Clinician: Referring Provider: Treating Provider/Extender: Alcario Drought in Treatment: 0 Information Obtained  From Patient Eyes Complaints and Symptoms: Positive for: Glasses / Contacts Medical History: Positive for: Cataracts - removed Gastrointestinal Complaints and Symptoms: Negative for: Frequent diarrhea; Nausea; Vomiting Genitourinary Complaints and Symptoms: Negative for: Frequent urination Medical History: Past Medical History Notes: CKD stage 3 Integumentary (Skin) Complaints and Symptoms: Positive for: Wounds Neurologic Complaints and Symptoms: Negative for: Numbness/parasthesias Psychiatric Complaints and Symptoms: Negative for: Claustrophobia; Suicidal Ear/Nose/Mouth/Throat Medical History: Past Medical History Notes: Hearing  loss Hematologic/Lymphatic Respiratory Medical History: Positive for: Chronic Obstructive Pulmonary Disease (COPD) Cardiovascular Medical History: Positive for: Hypertension; Peripheral Arterial Disease Endocrine Medical History: Positive for: Type II Diabetes Treated with: Oral agents Blood sugar tested every day: Yes Tested : Immunological Musculoskeletal Medical History: Positive for: Osteoarthritis - Back, Shoulder Oncologic Medical History: Negative for: Received Chemotherapy; Received Radiation Past Medical History Notes: Breast Cancer HBO Extended History Items Eyes: Cataracts Immunizations Pneumococcal Vaccine: Received Pneumococcal Vaccination: Yes Implantable Devices None Family and Social History Cancer: Yes - Siblings; Diabetes: Yes - Mother; Heart Disease: Yes - Mother; Hereditary Spherocytosis: No; Hypertension: Yes - Mother; Kidney Disease: No; Lung Disease: No; Seizures: No; Stroke: No; Thyroid Problems: No; Tuberculosis: No; Former smoker; Marital Status - Widowed; Alcohol Use: Never; Drug Use: No History; Caffeine Use: Moderate - Soda, coffee; Financial Concerns: No; Food, Clothing or Shelter Needs: No; Support System Lacking: No; Transportation Concerns: No Electronic Signature(s) Signed: 09/17/2020 6:01:46 PM By: Lorrin Jackson Signed: 09/18/2020 2:20:46 PM By: Kalman Shan DO Entered By: Lorrin Jackson on 09/17/2020 15:23:40 -------------------------------------------------------------------------------- SuperBill Details Patient Name: Date of Service: Leah Wiggins, Leah B. 09/17/2020 Medical Record Number: [id number] Patient Account Number: [id number] Date of Birth/Sex: Treating RN: [birth year] (87 y.o. Female) Lorrin Jackson Primary Care Provider: Glendale Chard Other Clinician: Referring Provider: Treating Provider/Extender: Alcario Drought in Treatment: 0 Diagnosis Coding ICD-10 Codes Code Description M01.027O  Unspecified open wound, right foot, initial encounter E11.9 Type 2 diabetes mellitus without complications Z36.6 Atopic dermatitis, unspecified Facility Procedures CPT4 Code: [id number] Description: 9213 - WOUND CARE VISIT-LEV 3 EST PT Modifier: 25 Quantity: 1 Physician Procedures : CPT4 Code Description Modifier [id number] [place] PHYS LEVEL 3 NEW PT ICD-10 Diagnosis Description F64.332R Unspecified open wound, right foot, initial encounter E11.9 Type 2 diabetes mellitus without complications J18.8 Atopic dermatitis, unspecified Quantity: 1 Electronic Signature(s) Signed: 09/18/2020 2:20:46 PM By: Kalman Shan DO Previous Signature: 09/17/2020 6:01:46 PM Version By: Lorrin Jackson Entered By: Kalman Shan on 09/18/2020 14:20:14

## 2020-09-24 ENCOUNTER — Encounter (HOSPITAL_BASED_OUTPATIENT_CLINIC_OR_DEPARTMENT_OTHER): Payer: Medicare Other | Admitting: Internal Medicine

## 2020-09-24 ENCOUNTER — Other Ambulatory Visit: Payer: Self-pay

## 2020-09-24 DIAGNOSIS — E1122 Type 2 diabetes mellitus with diabetic chronic kidney disease: Secondary | ICD-10-CM | POA: Diagnosis not present

## 2020-09-24 DIAGNOSIS — E119 Type 2 diabetes mellitus without complications: Secondary | ICD-10-CM | POA: Diagnosis not present

## 2020-09-24 DIAGNOSIS — I129 Hypertensive chronic kidney disease with stage 1 through stage 4 chronic kidney disease, or unspecified chronic kidney disease: Secondary | ICD-10-CM | POA: Diagnosis not present

## 2020-09-24 DIAGNOSIS — N183 Chronic kidney disease, stage 3 unspecified: Secondary | ICD-10-CM | POA: Diagnosis not present

## 2020-09-24 DIAGNOSIS — E11621 Type 2 diabetes mellitus with foot ulcer: Secondary | ICD-10-CM | POA: Diagnosis not present

## 2020-09-24 DIAGNOSIS — L97519 Non-pressure chronic ulcer of other part of right foot with unspecified severity: Secondary | ICD-10-CM | POA: Diagnosis not present

## 2020-09-24 DIAGNOSIS — L209 Atopic dermatitis, unspecified: Secondary | ICD-10-CM

## 2020-09-24 DIAGNOSIS — E1151 Type 2 diabetes mellitus with diabetic peripheral angiopathy without gangrene: Secondary | ICD-10-CM | POA: Diagnosis not present

## 2020-09-24 DIAGNOSIS — Z87891 Personal history of nicotine dependence: Secondary | ICD-10-CM | POA: Diagnosis not present

## 2020-09-24 DIAGNOSIS — S91301D Unspecified open wound, right foot, subsequent encounter: Secondary | ICD-10-CM | POA: Diagnosis not present

## 2020-09-24 NOTE — Progress Notes (Signed)
Cleansing - multiple wounds X- 1 5 Wound Imaging (photographs - any number of wounds) []  - 0 Wound Tracing (instead of photographs) []  - 0 Simple Wound Measurement - one wound []  - 0 Complex Wound Measurement - multiple wounds INTERVENTIONS - Wound Dressings []  - 0 Small Wound Dressing one or multiple wounds []  - 0 Medium Wound Dressing one or multiple wounds []  - 0 Large Wound Dressing one or multiple wounds []  - 0 Application of Medications - topical []  - 0 Application of Medications - injection INTERVENTIONS - Miscellaneous []  - 0 External ear exam []  - 0 Specimen Collection (cultures, biopsies, blood, body fluids, etc.) []  - 0 Specimen(s) / Culture(s) sent or taken to Lab for analysis []  - 0 Patient Transfer (multiple staff / [employer] fast streamer / Similar devices) []  - 0 Simple Staple / Suture removal (25 or less) []  - 0 Complex Staple / Suture removal (26 or more) []  - 0 Hypo / Hyperglycemic Management (close monitor of Blood Glucose) []  - 0 Ankle / Brachial Index (ABI) - do not check if billed separately X- 1 5 Vital Signs Has the patient been seen at the hospital within the last three years: Yes Total Score: 50 Level Of Care: New/Established - Level 2 Electronic Signature(s) Signed: 09/24/2020 6:43:27 PM By: Lorrin Jackson Entered By: Lorrin Jackson on 09/24/2020 09:24:06 -------------------------------------------------------------------------------- Encounter Discharge Information Details Patient Name: Date of Service: Leah Wiggins, Leah B. 09/24/2020 9:15 A M Medical Record Number: [id number] Patient Account Number: [id number] Date of Birth/Sex: Treating RN: [birth year] (87 y.o. Leah Wiggins Primary Care Maddalena Linarez: Glendale Chard Other Clinician: Referring  Diontre Harps: Treating Gwendoline Judy/Extender: Alcario Drought in Treatment: 1 Encounter Discharge Information Items Discharge Condition: Stable Ambulatory Status: Ambulatory Discharge Destination: Home Transportation: Private Auto Accompanied By: Daughter Schedule Follow-up Appointment: No Clinical Summary of Care: Provided on 09/24/2020 Form Type Recipient Paper Patient Patient Electronic Signature(s) Signed: 09/24/2020 6:43:27 PM By: Lorrin Jackson Entered By: Lorrin Jackson on 09/24/2020 09:27:17 -------------------------------------------------------------------------------- Lower Extremity Assessment Details Patient Name: Date of Service: Leah Wiggins 09/24/2020 9:15 A M Medical Record Number: [id number] Patient Account Number: [id number] Date of Birth/Sex: Treating RN: [birth year] (87 y.o. Leah Wiggins Primary Care Asaad Gulley: Glendale Chard Other Clinician: Referring Mycah Formica: Treating Alayssa Flinchum/Extender: Alcario Drought in Treatment: 1 Edema Assessment Assessed: [Left: No] [Right: Yes] Edema: [Left: Ye] [Right: s] Calf Left: Right: Point of Measurement: 30 cm From Medial Instep 33.8 cm Ankle Left: Right: Point of Measurement: 8 cm From Medial Instep 21.8 cm Vascular Assessment Pulses: Dorsalis Pedis Palpable: [Right:Yes] Electronic Signature(s) Signed: 09/24/2020 6:43:27 PM By: Lorrin Jackson Entered By: Lorrin Jackson on 09/24/2020 09:18:59 -------------------------------------------------------------------------------- Multi Wound Chart Details Patient Name: Date of Service: Leah Wiggins, Leah B. 09/24/2020 9:15 A M Medical Record Number: [id number] Patient Account Number: [id number] Date of Birth/Sex: Treating RN: 1926/03/27 (87 y.o. Leah Wiggins, Leah Wiggins Primary Care Leah Wiggins: Glendale Chard Other Clinician: Referring Leah Wiggins: Treating Leah Wiggins/Extender: Alcario Drought in Treatment:  1 Vital Signs Height(in): 53 Pulse(bpm): 42 Weight(lbs): 174 Blood Pressure(mmHg): 158/70 Body Mass Index(BMI): 30 Temperature(F): 99.6 Respiratory Rate(breaths/min): 18 Photos: [1:No Photos Right, Dorsal Foot] [N/A:N/A N/A] Wound Location: [1:Other Lesion] [N/A:N/A] Wounding Event: [1:Abrasion] [N/A:N/A] Primary Etiology: [1:Cataracts, Chronic Obstructive] [N/A:N/A] Comorbid History: [1:Pulmonary Disease (COPD), Hypertension, Peripheral Arterial Disease, Type II Diabetes, Osteoarthritis 08/03/2020] [N/A:N/A] Date Acquired: [1:1] [N/A:N/A] Weeks of Treatment: [1:Healed - Epithelialized] [N/A:N/A] Wound Status: [1:0x0x0] [N/A:N/A] Measurements L x W x D (cm) [1:0] [N/A:N/A] A (cm) : rea [  1:0] [N/A:N/A] Volume (cm) : [1:100.00%] [N/A:N/A] % Reduction in Area: [1:100.00%] [N/A:N/A] % Reduction in Volume: [1:Full Thickness Without Exposed] [N/A:N/A] Classification: [1:Support Structures] Treatment Notes Electronic Signature(s) Signed: 09/24/2020 9:39:48 AM By: Kalman Shan DO Signed: 09/24/2020 7:48:44 PM By: Rhae Hammock RN Entered By: Kalman Shan on 09/24/2020 09:32:58 -------------------------------------------------------------------------------- Multi-Disciplinary Care Plan Details Patient Name: Date of Service: Leah Wiggins, Leah B. 09/24/2020 9:15 A M Medical Record Number: [id number] Patient Account Number: [id number] Date of Birth/Sex: Treating RN: [birth year] (87 y.o. Leah Wiggins Primary Care Sharell Hilmer: Glendale Chard Other Clinician: Referring Henreitta Spittler: Treating Starla Deller/Extender: Alcario Drought in Treatment: 1 Active Inactive Electronic Signature(s) Signed: 09/24/2020 6:43:27 PM By: Lorrin Jackson Entered By: Lorrin Jackson on 09/24/2020 09:26:01 -------------------------------------------------------------------------------- Pain Assessment Details Patient Name: Date of Service: Leah Wiggins 09/24/2020 9:15 A  M Medical Record Number: [id number] Patient Account Number: [id number] Date of Birth/Sex: Treating RN: [birth year] (87 y.o. Leah Wiggins Primary Care Simmone Cape: Glendale Chard Other Clinician: Referring Janeisha Ryle: Treating Trayden Brandy/Extender: Alcario Drought in Treatment: 1 Active Problems Location of Pain Severity and Description of Pain Patient Has Paino No Site Locations Pain Management and Medication Current Pain Management: Electronic Signature(s) Signed: 09/24/2020 6:43:27 PM By: Lorrin Jackson Entered By: Lorrin Jackson on 09/24/2020 09:17:10 -------------------------------------------------------------------------------- Patient/Caregiver Education Details Patient Name: Date of Service: Leah Wiggins 7/21/2022andnbsp9:15 [place] Record Number: [id number] Patient Account Number: [id number] Date of Birth/Gender: Treating RN: [birth year] (87 y.o. Leah Wiggins Primary Care Physician: Glendale Chard Other Clinician: Referring Physician: Treating Physician/Extender: Alcario Drought in Treatment: 1 Education Assessment Education Provided To: Patient and Caregiver Education Topics Provided Notes Healed, discharge instructions given Electronic Signature(s) Signed: 09/24/2020 6:43:27 PM By: Lorrin Jackson Entered By: Lorrin Jackson on 09/24/2020 09:26:23 -------------------------------------------------------------------------------- Wound Assessment Details Patient Name: Date of Service: Leah Wiggins, Arleny B. 09/24/2020 9:15 A M Medical Record Number: [id number] Patient Account Number: [id number] Date of Birth/Sex: Treating RN: 01-17-1927 (87 y.o. Leah Wiggins Primary Care Maritssa Haughton: Glendale Chard Other Clinician: Referring Shavonta Gossen: Treating Finola Rosal/Extender: Alcario Drought in Treatment: 1 Wound Status Wound Number: 1 Primary Abrasion Etiology: Wound Location: Right, Dorsal  Foot Wound Healed - Epithelialized Wounding Event: Other Lesion Status: Date Acquired: 08/03/2020 Comorbid Cataracts, Chronic Obstructive Pulmonary Disease (COPD), Weeks Of Treatment: 1 History: Hypertension, Peripheral Arterial Disease, Type II Diabetes, Clustered Wound: No Osteoarthritis Wound Measurements Length: (cm) Width: (cm) Depth: (cm) Area: (cm) Volume: (cm) 0 % Reduction in Area: 100% 0 % Reduction in Volume: 100% 0 0 0 Wound Description Classification: Full Thickness Without Exposed Support Structur es Electronic Signature(s) Signed: 09/24/2020 6:43:27 PM By: Lorrin Jackson Entered By: Lorrin Jackson on 09/24/2020 09:20:05 -------------------------------------------------------------------------------- Vitals Details Patient Name: Date of Service: Leah Wiggins, Kahmari B. 09/24/2020 9:15 A M Medical Record Number: [id number] Patient Account Number: [id number] Date of Birth/Sex: Treating RN: 01/27/1927 (87 y.o. Leah Wiggins Primary Care Benicio Manna: Glendale Chard Other Clinician: Referring Cillian Gwinner: Treating Kemal Amores/Extender: Alcario Drought in Treatment: 1 Vital Signs Time Taken: 09:15 Temperature (F): 99.6 Height (in): 64 Pulse (bpm): 73 Weight (lbs): 174 Respiratory Rate (breaths/min): 18 Body Mass Index (BMI): 29.9 Blood Pressure (mmHg): 158/70 Reference Range: 80 - 120 mg / dl Electronic Signature(s) Signed: 09/24/2020 6:43:27 PM By: Lorrin Jackson Entered By: Lorrin Jackson on 09/24/2020 09:17:03  Cleansing - multiple wounds X- 1 5 Wound Imaging (photographs - any number of wounds) []  - 0 Wound Tracing (instead of photographs) []  - 0 Simple Wound Measurement - one wound []  - 0 Complex Wound Measurement - multiple wounds INTERVENTIONS - Wound Dressings []  - 0 Small Wound Dressing one or multiple wounds []  - 0 Medium Wound Dressing one or multiple wounds []  - 0 Large Wound Dressing one or multiple wounds []  - 0 Application of Medications - topical []  - 0 Application of Medications - injection INTERVENTIONS - Miscellaneous []  - 0 External ear exam []  - 0 Specimen Collection (cultures, biopsies, blood, body fluids, etc.) []  - 0 Specimen(s) / Culture(s) sent or taken to Lab for analysis []  - 0 Patient Transfer (multiple staff / [employer] fast streamer / Similar devices) []  - 0 Simple Staple / Suture removal (25 or less) []  - 0 Complex Staple / Suture removal (26 or more) []  - 0 Hypo / Hyperglycemic Management (close monitor of Blood Glucose) []  - 0 Ankle / Brachial Index (ABI) - do not check if billed separately X- 1 5 Vital Signs Has the patient been seen at the hospital within the last three years: Yes Total Score: 50 Level Of Care: New/Established - Level 2 Electronic Signature(s) Signed: 09/24/2020 6:43:27 PM By: Lorrin Jackson Entered By: Lorrin Jackson on 09/24/2020 09:24:06 -------------------------------------------------------------------------------- Encounter Discharge Information Details Patient Name: Date of Service: Leah Wiggins, Leah B. 09/24/2020 9:15 A M Medical Record Number: [id number] Patient Account Number: [id number] Date of Birth/Sex: Treating RN: [birth year] (87 y.o. Leah Wiggins Primary Care Maddalena Linarez: Glendale Chard Other Clinician: Referring  Diontre Harps: Treating Gwendoline Judy/Extender: Alcario Drought in Treatment: 1 Encounter Discharge Information Items Discharge Condition: Stable Ambulatory Status: Ambulatory Discharge Destination: Home Transportation: Private Auto Accompanied By: Daughter Schedule Follow-up Appointment: No Clinical Summary of Care: Provided on 09/24/2020 Form Type Recipient Paper Patient Patient Electronic Signature(s) Signed: 09/24/2020 6:43:27 PM By: Lorrin Jackson Entered By: Lorrin Jackson on 09/24/2020 09:27:17 -------------------------------------------------------------------------------- Lower Extremity Assessment Details Patient Name: Date of Service: Leah Wiggins 09/24/2020 9:15 A M Medical Record Number: [id number] Patient Account Number: [id number] Date of Birth/Sex: Treating RN: [birth year] (87 y.o. Leah Wiggins Primary Care Asaad Gulley: Glendale Chard Other Clinician: Referring Mycah Formica: Treating Alayssa Flinchum/Extender: Alcario Drought in Treatment: 1 Edema Assessment Assessed: [Left: No] [Right: Yes] Edema: [Left: Ye] [Right: s] Calf Left: Right: Point of Measurement: 30 cm From Medial Instep 33.8 cm Ankle Left: Right: Point of Measurement: 8 cm From Medial Instep 21.8 cm Vascular Assessment Pulses: Dorsalis Pedis Palpable: [Right:Yes] Electronic Signature(s) Signed: 09/24/2020 6:43:27 PM By: Lorrin Jackson Entered By: Lorrin Jackson on 09/24/2020 09:18:59 -------------------------------------------------------------------------------- Multi Wound Chart Details Patient Name: Date of Service: Leah Wiggins, Leah B. 09/24/2020 9:15 A M Medical Record Number: [id number] Patient Account Number: [id number] Date of Birth/Sex: Treating RN: 1926/03/27 (87 y.o. Leah Wiggins, Leah Wiggins Primary Care Leah Wiggins: Glendale Chard Other Clinician: Referring Leah Wiggins: Treating Leah Wiggins/Extender: Alcario Drought in Treatment:  1 Vital Signs Height(in): 53 Pulse(bpm): 42 Weight(lbs): 174 Blood Pressure(mmHg): 158/70 Body Mass Index(BMI): 30 Temperature(F): 99.6 Respiratory Rate(breaths/min): 18 Photos: [1:No Photos Right, Dorsal Foot] [N/A:N/A N/A] Wound Location: [1:Other Lesion] [N/A:N/A] Wounding Event: [1:Abrasion] [N/A:N/A] Primary Etiology: [1:Cataracts, Chronic Obstructive] [N/A:N/A] Comorbid History: [1:Pulmonary Disease (COPD), Hypertension, Peripheral Arterial Disease, Type II Diabetes, Osteoarthritis 08/03/2020] [N/A:N/A] Date Acquired: [1:1] [N/A:N/A] Weeks of Treatment: [1:Healed - Epithelialized] [N/A:N/A] Wound Status: [1:0x0x0] [N/A:N/A] Measurements L x W x D (cm) [1:0] [N/A:N/A] A (cm) : rea [

## 2020-09-24 NOTE — Progress Notes (Signed)
Leah Wiggins, Leah Wiggins ([id number]) Visit Report for 09/24/2020 Chief Complaint Document Details Patient Name: Date of Service: Leah Wiggins, Leah Wiggins 09/24/2020 9:15 A M Medical Record Number: [id number] Patient Account Number: [id number] Date of Birth/Sex: Treating RN: 1927/03/02 (87 y.o. Tonita Phoenix, Lauren Primary Care Provider: Glendale Chard Other Clinician: Referring Provider: Treating Provider/Extender: Alcario Drought in Treatment: 1 Information Obtained from: Patient Chief Complaint Right foot wound Electronic Signature(s) Signed: 09/24/2020 9:39:48 AM By: Kalman Shan DO Entered By: Kalman Shan on 09/24/2020 09:33:07 -------------------------------------------------------------------------------- HPI Details Patient Name: Date of Service: Leah Wiggins, Leah B. 09/24/2020 9:15 A M Medical Record Number: [id number] Patient Account Number: [id number] Date of Birth/Sex: Treating RN: [birth year] (87 y.o. Benjaman Lobe Primary Care Provider: Glendale Chard Other Clinician: Referring Provider: Treating Provider/Extender: Alcario Drought in Treatment: 1 History of Present Illness HPI Description: Admission 7/14 Leah Wiggins is a 87 year old female with a past medical history of type 2 diabetes on oral agents, essential hypertension and peripheral arterial disease that presents to our clinic for a wound to the top of her right foot. Patient has had issues with wounds to her right foot on and off for 5 years. The current wound started 2 months ago and has been slowly improving. She has tried betamethasone cream and triamcinolone cream with benefit. She is currently using triamcinolone cream. She also soaks her feet every 2 weeks. She has been followed by dermatology for this issue. She states she has been diagnosed with atopic dermatitis. She currently denies signs of infection. 7/21; patient presents for 1 week  follow-up. She has been using TCA with collagen. She reports improvement to her wound and overall appearance of her Right foot. Electronic Signature(s) Signed: 09/24/2020 9:39:48 AM By: Kalman Shan DO Entered By: Kalman Shan on 09/24/2020 09:33:59 -------------------------------------------------------------------------------- Physical Exam Details Patient Name: Date of Service: PRESTON, WEILL 09/24/2020 9:15 A M Medical Record Number: [id number] Patient Account Number: [id number] Date of Birth/Sex: Treating RN: 04/22/1926 (87 y.o. Tonita Phoenix, Lauren Primary Care Provider: Glendale Chard Other Clinician: Referring Provider: Treating Provider/Extender: Alcario Drought in Treatment: 1 Constitutional respirations regular, non-labored and within target range for patient.. Cardiovascular 2+ dorsalis pedis/posterior tibialis pulses. Psychiatric pleasant and cooperative. Notes Right foot: No flaking noted. No open wounds. No signs of infection. Electronic Signature(s) Signed: 09/24/2020 9:39:48 AM By: Kalman Shan DO Entered By: Kalman Shan on 09/24/2020 09:34:50 -------------------------------------------------------------------------------- Physician Orders Details Patient Name: Date of Service: Leah Wiggins, Leah B. 09/24/2020 9:15 A M Medical Record Number: [id number] Patient Account Number: [id number] Date of Birth/Sex: Treating RN: 28-May-1926 (87 y.o. Sue Lush Primary Care Provider: Glendale Chard Other Clinician: Referring Provider: Treating Provider/Extender: Alcario Drought in Treatment: 1 Verbal / Phone Orders: No Diagnosis Coding ICD-10 Coding Code Description V42.595G Unspecified open wound, right foot, subsequent encounter E11.9 Type 2 diabetes mellitus without complications L87.5 Atopic dermatitis, unspecified Discharge From [facility] Services Discharge from [place] using  Triamcinolone Cream and Moisturizing Cream. May wash foot but no foot soaks. -Call if any new issues Electronic Signature(s) Signed: 09/24/2020 9:39:48 AM By: Kalman Shan DO Entered By: Kalman Shan on 09/24/2020 09:35:08 -------------------------------------------------------------------------------- Problem List Details Patient Name: Date of Service: Leah Wiggins, Leah B. 09/24/2020 9:15 A M Medical Record Number: [id number] Patient Account Number: [id number] Date of Birth/Sex: Treating RN: 24-Nov-1926 (87 y.o. Tonita Phoenix, Lauren Primary Care Provider: Glendale Chard Other Clinician: Referring Provider: Treating Provider/Extender: Tawni Levy  Weeks in Treatment: 1 Active Problems ICD-10 Encounter Code Description Active Date MDM Diagnosis S91.301D Unspecified open wound, right foot, subsequent encounter 09/24/2020 No Yes E11.9 Type 2 diabetes mellitus without complications 3/38/2505 No Yes L20.9 Atopic dermatitis, unspecified 09/18/2020 No Yes Inactive Problems ICD-10 Code Description Active Date Inactive Date S91.301A Unspecified open wound, right foot, initial encounter 09/18/2020 09/18/2020 Resolved Problems Electronic Signature(s) Signed: 09/24/2020 9:39:48 AM By: Kalman Shan DO Entered By: Kalman Shan on 09/24/2020 09:32:53 -------------------------------------------------------------------------------- Progress Note Details Patient Name: Date of Service: Leah Wiggins, Leah B. 09/24/2020 9:15 A M Medical Record Number: [id number] Patient Account Number: [id number] Date of Birth/Sex: Treating RN: [birth year] (87 y.o. Tonita Phoenix, Lauren Primary Care Provider: Glendale Chard Other Clinician: Referring Provider: Treating Provider/Extender: Alcario Drought in Treatment: 1 Subjective Chief Complaint Information obtained from Patient Right foot wound History of Present Illness (HPI) Admission 7/14 Leah Wiggins is a 87 year old female with a past medical history of type 2 diabetes on oral agents, essential hypertension and peripheral arterial disease that presents to our clinic for a wound to the top of her right foot. Patient has had issues with wounds to her right foot on and off for 5 years. The current wound started 2 months ago and has been slowly improving. She has tried betamethasone cream and triamcinolone cream with benefit. She is currently using triamcinolone cream. She also soaks her feet every 2 weeks. She has been followed by dermatology for this issue. She states she has been diagnosed with atopic dermatitis. She currently denies signs of infection. 7/21; patient presents for 1 week follow-up. She has been using TCA with collagen. She reports improvement to her wound and overall appearance of her Right foot. Patient History Information obtained from Patient. Family History Cancer - Siblings, Diabetes - Mother, Heart Disease - Mother, Hypertension - Mother, No family history of Hereditary Spherocytosis, Kidney Disease, Lung Disease, Seizures, Stroke, Thyroid Problems, Tuberculosis. Social History Former smoker, Marital Status - Widowed, Alcohol Use - Never, Drug Use - No History, Caffeine Use - Moderate - Soda, coffee. Medical History Eyes Patient has history of Cataracts - removed Respiratory Patient has history of Chronic Obstructive Pulmonary Disease (COPD) Cardiovascular Patient has history of Hypertension, Peripheral Arterial Disease Endocrine Patient has history of Type II Diabetes Musculoskeletal Patient has history of Osteoarthritis - Back, Shoulder Oncologic Denies history of Received Chemotherapy, Received Radiation Medical A Surgical History Notes nd Ear/Nose/Mouth/Throat Hearing loss Genitourinary CKD stage 3 Oncologic Breast Cancer Objective Constitutional respirations regular, non-labored and within target range for patient.. Vitals Time Taken: 9:15  AM, Height: 64 in, Weight: 174 lbs, BMI: 29.9, Temperature: 99.6 F, Pulse: 73 bpm, Respiratory Rate: 18 breaths/min, Blood Pressure: 158/70 mmHg. Cardiovascular 2+ dorsalis pedis/posterior tibialis pulses. Psychiatric pleasant and cooperative. General Notes: Right foot: No flaking noted. No open wounds. No signs of infection. Integumentary (Hair, Skin) Wound #1 status is Healed - Epithelialized. Original cause of wound was Other Lesion. The date acquired was: 08/03/2020. The wound has been in treatment 1 weeks. The wound is located on the Right,Dorsal Foot. The wound measures 0cm length x 0cm width x 0cm depth; 0cm^2 area and 0cm^3 volume. Assessment Active Problems ICD-10 Unspecified open wound, right foot, subsequent encounter Type 2 diabetes mellitus without complications Atopic dermatitis, unspecified Patient presents today with no open wounds. She appeared to have a dermatological issue. Her skin looks much better. She has been using TCA with collagen. She no longer needs collagen but I recommended using lotion to  her feet daily. If she has areas of irritation I recommended using steroid cream. I recommended she no longer soak her feet on a regular basis.She may follow-up as needed. Plan Discharge From [facility] Services: Discharge from [place] using Triamcinolone Cream and Moisturizing Cream. May wash foot but no foot soaks. -Call if any new issues 1. Lotion to her feet daily 2. Use steroid cream for the next week and then as needed 3. Discharge from our clinic due to closed wounds. Electronic Signature(s) Signed: 09/24/2020 9:39:48 AM By: Kalman Shan DO Entered By: Kalman Shan on 09/24/2020 09:39:07 -------------------------------------------------------------------------------- HxROS Details Patient Name: Date of Service: Leah Wiggins, Leah B. 09/24/2020 9:15 A M Medical Record Number: [id number] Patient Account Number: [id number] Date of  Birth/Sex: Treating RN: [birth year] (87 y.o. Tonita Phoenix, Lauren Primary Care Provider: Glendale Chard Other Clinician: Referring Provider: Treating Provider/Extender: Alcario Drought in Treatment: 1 Information Obtained From Patient Eyes Medical History: Positive for: Cataracts - removed Ear/Nose/Mouth/Throat Medical History: Past Medical History Notes: Hearing loss Respiratory Medical History: Positive for: Chronic Obstructive Pulmonary Disease (COPD) Cardiovascular Medical History: Positive for: Hypertension; Peripheral Arterial Disease Endocrine Medical History: Positive for: Type II Diabetes Treated with: Oral agents Blood sugar tested every day: Yes Tested : Genitourinary Medical History: Past Medical History Notes: CKD stage 3 Musculoskeletal Medical History: Positive for: Osteoarthritis - Back, Shoulder Oncologic Medical History: Negative for: Received Chemotherapy; Received Radiation Past Medical History Notes: Breast Cancer HBO Extended History Items Eyes: Cataracts Immunizations Pneumococcal Vaccine: Received Pneumococcal Vaccination: Yes Implantable Devices None Family and Social History Cancer: Yes - Siblings; Diabetes: Yes - Mother; Heart Disease: Yes - Mother; Hereditary Spherocytosis: No; Hypertension: Yes - Mother; Kidney Disease: No; Lung Disease: No; Seizures: No; Stroke: No; Thyroid Problems: No; Tuberculosis: No; Former smoker; Marital Status - Widowed; Alcohol Use: Never; Drug Use: No History; Caffeine Use: Moderate - Soda, coffee; Financial Concerns: No; Food, Clothing or Shelter Needs: No; Support System Lacking: No; Transportation Concerns: No Electronic Signature(s) Signed: 09/24/2020 9:39:48 AM By: Kalman Shan DO Signed: 09/24/2020 7:48:44 PM By: Rhae Hammock RN Entered By: Kalman Shan on 09/24/2020 09:34:06 -------------------------------------------------------------------------------- SuperBill  Details Patient Name: Date of Service: Leah Wiggins, Leah B. 09/24/2020 Medical Record Number: [id number] Patient Account Number: [id number] Date of Birth/Sex: Treating RN: 10-25-1926 (87 y.o. Sue Lush Primary Care Provider: Glendale Chard Other Clinician: Referring Provider: Treating Provider/Extender: Alcario Drought in Treatment: 1 Diagnosis Coding ICD-10 Codes Code Description Y69.485I Unspecified open wound, right foot, subsequent encounter E11.9 Type 2 diabetes mellitus without complications O27.0 Atopic dermatitis, unspecified Facility Procedures CPT4 Code: [id number] Description: [phone] - WOUND CARE VISIT-LEV 2 EST PT Modifier: Quantity: 1 Physician Procedures : CPT4 Code Description Modifier [id number] - WC PHYS LEVEL 3 - EST PT ICD-10 Diagnosis Description S91.301D Unspecified open wound, right foot, subsequent encounter E11.9 Type 2 diabetes mellitus without complications F75.1 Atopic dermatitis,  unspecified Quantity: 1 Electronic Signature(s) Signed: 09/24/2020 9:39:48 AM By: Kalman Shan DO Entered By: Kalman Shan on 09/24/2020 09:39:26

## 2020-10-12 ENCOUNTER — Telehealth: Payer: Self-pay | Admitting: Internal Medicine

## 2020-10-12 NOTE — Telephone Encounter (Signed)
Tried calling patient no answer  Patient has awv scheduled 03/03/21  I wanted to see if patient could schedule sooner  Calender year [facility]

## 2020-12-10 ENCOUNTER — Encounter: Payer: Self-pay | Admitting: Sports Medicine

## 2020-12-10 ENCOUNTER — Other Ambulatory Visit: Payer: Self-pay

## 2020-12-10 ENCOUNTER — Ambulatory Visit (INDEPENDENT_AMBULATORY_CARE_PROVIDER_SITE_OTHER): Payer: Medicare Other | Admitting: Sports Medicine

## 2020-12-10 DIAGNOSIS — M79609 Pain in unspecified limb: Secondary | ICD-10-CM

## 2020-12-10 DIAGNOSIS — I739 Peripheral vascular disease, unspecified: Secondary | ICD-10-CM

## 2020-12-10 DIAGNOSIS — B351 Tinea unguium: Secondary | ICD-10-CM | POA: Diagnosis not present

## 2020-12-10 NOTE — Progress Notes (Signed)
Subjective: Leah Wiggins is a 87 y.o. female patient with history of diabetes who presents to office today complaining of long,mildly painful nails  while ambulating in shoes; unable to trim. FBS not recorded at today's visit. Patient denies any new changes in medication or new problems since last encounter.  Patient Active Problem List   Diagnosis Date Noted   Hypertensive nephropathy 09/03/2020   Type 2 diabetes mellitus with stage 2 chronic kidney disease, without long-term current use of insulin ([place]) 09/03/2020   Atherosclerosis of aorta ([place]) 09/03/2020   Absolute anemia 09/03/2020   Cellulitis of left foot 09/03/2020   Class 1 obesity due to excess calories with serious comorbidity and body mass index (BMI) of 33.0 to 33.9 in adult 09/03/2020   Abnormal feces 06/04/2020   Abnormal weight loss 06/04/2020   Angiodysplasia of intestine 06/04/2020   Colon cancer screening 06/04/2020   Gastroesophageal reflux disease 06/04/2020   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 hearing of right ear 06/04/2020   Prolapsed internal hemorrhoids 06/04/2020   Rectal bleeding 06/04/2020   Sensorineural hearing loss (SNHL) of right ear with restricted hearing of left ear 06/04/2020   Mild cognitive impairment 12/23/2019   Memory loss 06/24/2019   Intrinsic atopic dermatitis 09/18/2018   Chronic obstructive pulmonary disease ([place]) 04/06/2018   Prediabetes 02/07/2018   Iron deficiency anemia due to chronic blood loss 12/25/2017   Anaphylaxis 05/04/2017   Rash and nonspecific skin eruption 05/04/2017   History of breast cancer 08/14/2014   Empty sella ([place]) 07/14/2014   Pain in the chest 04/28/2014   Diabetes mellitus without complication ([place]) 38/12/1749   Chest pain 03/03/2014   Essential hypertension 06/24/2013   PAD (peripheral artery disease), abnormal ABIs 12/10/2012   Non-stress test nonreactive, 04/24/12, normal 12/10/2012   Dyslipidemia 12/10/2012    Tobacco use 12/10/2012   Thrombocytopenia ([place]) 01/24/2012   RESTLESS LEG SYNDROME 02/16/2010   METHICILLIN SUSECPTIBLE PNEUMONIA STAPH AUREUS 01/13/2010   HIP THI LEG&ANK ABRASION/FRICION BURN W/O INF 01/13/2010   Current Outpatient Medications on File Prior to Visit  Medication Sig Dispense Refill   albuterol (PROVENTIL) (2.5 MG/3ML) 0.083% nebulizer solution Take 3 mLs (2.5 mg total) by nebulization every 4 (four) hours as needed for wheezing or shortness of breath. 75 mL 2   alendronate (FOSAMAX) 70 MG tablet TAKE 1 TABLET EVERY WEDNESDAY WITH A FULL GLASS OF WATER ON AN EMPTY STOMACH 12 tablet 3   aspirin EC 81 MG tablet Take 81 mg by mouth daily.     betamethasone dipropionate (DIPROLENE) 0.05 % ointment      betamethasone valerate ointment (VALISONE) 0.1 %      Blood Glucose Monitoring Suppl (FREESTYLE FREEDOM LITE) w/Device KIT Use to check blood sugar 3 times a day. Dx code e11.65 1 kit 3   clotrimazole-betamethasone (LOTRISONE) cream Apply to affected area 2 times daily prn 30 g 0   doxycycline (VIBRAMYCIN) 100 MG capsule Take 1 capsule (100 mg total) by mouth 2 (two) times daily. 20 capsule 0   Fluocinolone Acetonide 0.01 % OIL      gabapentin (NEURONTIN) 100 MG capsule One capsule po nightly 90 capsule 2   glucose blood (FREESTYLE LITE) test strip Use to check blood sugar 3 times a day. Dx code e11.65 150 each 3   hydrocortisone 2.5 % cream      ketorolac (TORADOL) 15 MG/ML injection Inject 15 mg into the muscle once.     memantine (NAMENDA) 10 MG  tablet Take 1 tablet (10 mg total) by mouth 2 (two) times daily. 60 tablet 11   memantine (NAMENDA) 10 MG tablet Take by mouth.     metFORMIN (GLUCOPHAGE-XR) 500 MG 24 hr tablet TAKE 1 TABLET TWICE A DAY 180 tablet 3   MICARDIS 40 MG tablet TAKE 1 TABLET DAILY 90 tablet 3   mometasone (ELOCON) 0.1 % lotion Apply 1 application topically daily as needed (IRRITATION).   0   mupirocin ointment (BACTROBAN) 2 % 1 application as needed.    0   omeprazole (PRILOSEC) 20 MG capsule Take 1 capsule by mouth daily.     pantoprazole (PROTONIX) 40 MG tablet TAKE 1 TABLET DAILY AT 12 NOON 90 tablet 3   pravastatin (PRAVACHOL) 40 MG tablet TAKE 1 TABLET DAILY 90 tablet 3   Spacer/Aero-Holding Chambers (AEROCHAMBER PLUS) inhaler Use as instructed 1 each 2   tiotropium (SPIRIVA) 18 MCG inhalation capsule Place 1 capsule (18 mcg total) into inhaler and inhale daily. 90 capsule 2   triamcinolone (KENALOG) 0.1 %      ferrous sulfate 325 (65 FE) MG EC tablet Take 1 tablet (325 mg total) by mouth 3 (three) times daily with meals. (Patient taking differently: Take 325 mg by mouth 3 (three) times daily with meals. 1 daily) 90 tablet 2   No current facility-administered medications on file prior to visit.   Allergies  Allergen Reactions   Bactrim [Sulfamethoxazole-Trimethoprim] Swelling    Marked angioedema requiring hospitalization   Penicillins Other (See Comments)    Has patient had a PCN reaction causing immediate rash, facial/tongue/throat swelling, SOB or lightheadedness with hypotension: Unk Has patient had a PCN reaction causing severe rash involving mucus membranes or skin necrosis: Unk Has patient had a PCN reaction that required hospitalization: Unk Has patient had a PCN reaction occurring within the last 10 years: No If all of the above answers are "NO", then may proceed with Cephalosporin use.    No results found for this or any previous visit (from the past 2160 hour(s)).   Objective: General: Patient is awake, alert, and oriented x 3 and in no acute distress.  Integument: Skin is warm, dry and supple bilateral. Nails are tender, long, thickened and dystrophic with subungual debris, consistent with onychomycosis, 1-5 bilateral. No signs of infection. No open lesions or preulcerative lesions present bilateral. Remaining integument unremarkable.  Vasculature:  Dorsalis Pedis pulse 1/4 bilateral. Posterior Tibial pulse  0/4  bilateral.  Capillary fill time <3 sec 1-5 bilateral. Scant hair growth to the level of the digits. Temperature gradient within normal limits. No varicosities present bilateral. No edema present bilateral.   Neurology: The patient has diminished sensation measured with a 5.07/10g Semmes Weinstein Monofilament at all pedal sites bilateral . Vibratory sensation diminished bilateral with tuning fork.  Musculoskeletal: Asymptomatic bunion, hammertoes, pes planus pedal deformities noted bilateral. Muscular strength 4/5 in all lower extremity muscular groups bilateral without pain on range of motion. No tenderness with calf compression bilateral.  Assessment and Plan: Problem List Items Addressed This Visit   None Visit Diagnoses     Pain due to onychomycosis of nail    -  Primary   PVD (peripheral vascular disease) ([place])          -Examined patient. -Discussed and educated patient on diabetic foot care, especially with  regards to the vascular, neurological and musculoskeletal systems.  -Mechanically debrided all nails 1-5 bilateral using sterile nail nipper in length and girth and filed with dremel without  incident  -Encourage patient to continue with daily skin emollients for dry skin -Answered all patient questions -Patient to return  in 3 months for at risk foot care -Patient advised to call the office if any problems or questions arise in the meantime.  Landis Martins, DPM

## 2020-12-15 DIAGNOSIS — Z961 Presence of intraocular lens: Secondary | ICD-10-CM | POA: Diagnosis not present

## 2020-12-15 DIAGNOSIS — H52203 Unspecified astigmatism, bilateral: Secondary | ICD-10-CM | POA: Diagnosis not present

## 2020-12-15 DIAGNOSIS — H04123 Dry eye syndrome of bilateral lacrimal glands: Secondary | ICD-10-CM | POA: Diagnosis not present

## 2020-12-15 DIAGNOSIS — H5203 Hypermetropia, bilateral: Secondary | ICD-10-CM | POA: Diagnosis not present

## 2020-12-15 DIAGNOSIS — Z7984 Long term (current) use of oral hypoglycemic drugs: Secondary | ICD-10-CM | POA: Diagnosis not present

## 2020-12-15 DIAGNOSIS — H524 Presbyopia: Secondary | ICD-10-CM | POA: Diagnosis not present

## 2020-12-15 DIAGNOSIS — E119 Type 2 diabetes mellitus without complications: Secondary | ICD-10-CM | POA: Diagnosis not present

## 2020-12-15 LAB — HM DIABETES EYE EXAM

## 2020-12-16 ENCOUNTER — Encounter: Payer: Self-pay | Admitting: Internal Medicine

## 2020-12-24 DIAGNOSIS — Z0271 Encounter for disability determination: Secondary | ICD-10-CM

## 2020-12-27 ENCOUNTER — Other Ambulatory Visit: Payer: Self-pay | Admitting: Internal Medicine

## 2021-01-06 ENCOUNTER — Ambulatory Visit: Payer: Medicare Other | Admitting: Internal Medicine

## 2021-01-08 ENCOUNTER — Encounter: Payer: Self-pay | Admitting: Internal Medicine

## 2021-01-20 ENCOUNTER — Ambulatory Visit (INDEPENDENT_AMBULATORY_CARE_PROVIDER_SITE_OTHER): Payer: Medicare Other | Admitting: Internal Medicine

## 2021-01-20 ENCOUNTER — Other Ambulatory Visit: Payer: Self-pay

## 2021-01-20 ENCOUNTER — Encounter: Payer: Self-pay | Admitting: Internal Medicine

## 2021-01-20 VITALS — BP 124/76 | HR 80 | Temp 99.0°F | Ht 60.0 in | Wt 174.4 lb

## 2021-01-20 DIAGNOSIS — Z23 Encounter for immunization: Secondary | ICD-10-CM

## 2021-01-20 DIAGNOSIS — E1122 Type 2 diabetes mellitus with diabetic chronic kidney disease: Secondary | ICD-10-CM | POA: Diagnosis not present

## 2021-01-20 DIAGNOSIS — N1831 Chronic kidney disease, stage 3a: Secondary | ICD-10-CM

## 2021-01-20 DIAGNOSIS — Z6834 Body mass index (BMI) 34.0-34.9, adult: Secondary | ICD-10-CM

## 2021-01-20 DIAGNOSIS — E236 Other disorders of pituitary gland: Secondary | ICD-10-CM

## 2021-01-20 DIAGNOSIS — E6609 Other obesity due to excess calories: Secondary | ICD-10-CM

## 2021-01-20 DIAGNOSIS — I129 Hypertensive chronic kidney disease with stage 1 through stage 4 chronic kidney disease, or unspecified chronic kidney disease: Secondary | ICD-10-CM

## 2021-01-20 DIAGNOSIS — N182 Chronic kidney disease, stage 2 (mild): Secondary | ICD-10-CM | POA: Diagnosis not present

## 2021-01-20 MED ORDER — TETANUS-DIPHTH-ACELL PERTUSSIS 5-2.5-18.5 LF-MCG/0.5 IM SUSP: 0.5000 mL | Freq: Once | INTRAMUSCULAR | 0 refills | Status: AC

## 2021-01-20 MED ORDER — TIOTROPIUM BROMIDE MONOHYDRATE 18 MCG IN CAPS: 18.0000 ug | ORAL_CAPSULE | Freq: Every day | RESPIRATORY_TRACT | 2 refills | Status: DC

## 2021-01-20 MED ORDER — PREVNAR 20 0.5 ML IM SUSY: 0.5000 mL | PREFILLED_SYRINGE | INTRAMUSCULAR | 0 refills | Status: AC

## 2021-01-20 NOTE — Progress Notes (Signed)
I,[name],acting as a Education administrator for Maximino Greenland, MD.,have documented all relevant documentation on the behalf of Maximino Greenland, MD,as directed by  Maximino Greenland, MD while in the presence of Maximino Greenland, MD.  This visit occurred during the SARS-CoV-2 public health emergency.  Safety protocols were in place, including screening questions prior to the visit, additional usage of staff PPE, and extensive cleaning of exam room while observing appropriate contact time as indicated for disinfecting solutions.  Subjective:     Patient ID: Leah Wiggins , female    DOB: 11-17-1926 , 87 y.o.   MRN: [id number]   Chief Complaint  Patient presents with   Diabetes   Hypertension    HPI  The patient is here today for a diabetes and blood pressure follow-up.  She is accompanied by her daughter today. She reports compliance with meds. Denies headaches, chest pain and shortness of breath. However, admits she is not getting much exercise.   Diabetes She presents for her follow-up diabetic visit. She has type 2 diabetes mellitus. There are no hypoglycemic associated symptoms. Pertinent negatives for diabetes include no blurred vision. There are no hypoglycemic complications. Risk factors for coronary artery disease include diabetes mellitus, dyslipidemia, hypertension, obesity, sedentary lifestyle and post-menopausal.  Hypertension This is a chronic problem. The current episode started more than 1 year ago. The problem has been gradually improving since onset. The problem is controlled. Pertinent negatives include no blurred vision. The current treatment provides moderate improvement.    Past Medical History:  Diagnosis Date   Arthritis    "shoulders" (05/04/2017)   Breast cancer, right breast ([place]) 1991   GERD (gastroesophageal reflux disease)    Hard of hearing    Hyperlipidemia    Hypertension    Memory loss    Non-stress test nonreactive, 04/24/12, normal 12/10/2012   OSA on  CPAP    "don't wear it all the time" (05/04/2017)   PVD (peripheral vascular disease) ([place])    Tobacco use 12/10/2012   Type II diabetes mellitus ([place])      Family History  Problem Relation Age of Onset   Heart disease Mother    Stroke Mother    Stroke Father    Cancer Sister        GIST   Cancer Brother        thyroid    Diabetes Son    Stroke Son      Current Outpatient Medications:    albuterol (PROVENTIL) (2.5 MG/3ML) 0.083% nebulizer solution, Take 3 mLs (2.5 mg total) by nebulization every 4 (four) hours as needed for wheezing or shortness of breath., Disp: 75 mL, Rfl: 2   alendronate (FOSAMAX) 70 MG tablet, TAKE 1 TABLET EVERY WEDNESDAY WITH A FULL GLASS OF WATER ON AN EMPTY STOMACH, Disp: 12 tablet, Rfl: 3   aspirin EC 81 MG tablet, Take 81 mg by mouth daily., Disp: , Rfl:    betamethasone dipropionate (DIPROLENE) 0.05 % ointment, , Disp: , Rfl:    Blood Glucose Monitoring Suppl (FREESTYLE FREEDOM LITE) w/Device KIT, Use to check blood sugar 3 times a day. Dx code e11.65, Disp: 1 kit, Rfl: 3   clotrimazole-betamethasone (LOTRISONE) cream, Apply to affected area 2 times daily prn, Disp: 30 g, Rfl: 0   ferrous sulfate 325 (65 FE) MG EC tablet, Take 1 tablet (325 mg total) by mouth 3 (three) times daily with meals. (Patient taking differently: Take 325 mg by mouth 3 (three) times daily with meals.  1 daily), Disp: 90 tablet, Rfl: 2   Fluocinolone Acetonide 0.01 % OIL, , Disp: , Rfl:    gabapentin (NEURONTIN) 100 MG capsule, TAKE 1 CAPSULE NIGHTLY, Disp: 90 capsule, Rfl: 3   glucose blood (FREESTYLE LITE) test strip, Use to check blood sugar 3 times a day. Dx code e11.65, Disp: 150 each, Rfl: 3   hydrocortisone 2.5 % cream, , Disp: , Rfl:    metFORMIN (GLUCOPHAGE-XR) 500 MG 24 hr tablet, TAKE 1 TABLET TWICE A DAY, Disp: 180 tablet, Rfl: 3   MICARDIS 40 MG tablet, TAKE 1 TABLET DAILY, Disp: 90 tablet, Rfl: 3   mometasone (ELOCON) 0.1 % lotion, Apply 1 application topically daily as  needed (IRRITATION). , Disp: , Rfl: 0   mupirocin ointment (BACTROBAN) 2 %, 1 application as needed. , Disp: , Rfl: 0   pantoprazole (PROTONIX) 40 MG tablet, TAKE 1 TABLET DAILY AT 12 NOON, Disp: 90 tablet, Rfl: 3   pravastatin (PRAVACHOL) 40 MG tablet, TAKE 1 TABLET DAILY, Disp: 90 tablet, Rfl: 3   Spacer/Aero-Holding Chambers (AEROCHAMBER PLUS) inhaler, Use as instructed, Disp: 1 each, Rfl: 2   tiotropium (SPIRIVA) 18 MCG inhalation capsule, Place 1 capsule (18 mcg total) into inhaler and inhale daily., Disp: 90 capsule, Rfl: 2   triamcinolone (KENALOG) 0.1 %, , Disp: , Rfl:    ketorolac (TORADOL) 15 MG/ML injection, Inject 15 mg into the muscle once. (Patient not taking: Reported on 01/20/2021), Disp: , Rfl:    memantine (NAMENDA) 10 MG tablet, Take 1 tablet (10 mg total) by mouth 2 (two) times daily. (Patient not taking: Reported on 01/20/2021), Disp: 60 tablet, Rfl: 11   Allergies  Allergen Reactions   Bactrim [Sulfamethoxazole-Trimethoprim] Swelling    Marked angioedema requiring hospitalization   Penicillins Other (See Comments)    Has patient had a PCN reaction causing immediate rash, facial/tongue/throat swelling, SOB or lightheadedness with hypotension: Unk Has patient had a PCN reaction causing severe rash involving mucus membranes or skin necrosis: Unk Has patient had a PCN reaction that required hospitalization: Unk Has patient had a PCN reaction occurring within the last 10 years: No If all of the above answers are "NO", then may proceed with Cephalosporin use.     Review of Systems  Constitutional: Negative.   Eyes:  Negative for blurred vision.  Respiratory: Negative.    Cardiovascular: Negative.   Gastrointestinal: Negative.   Psychiatric/Behavioral: Negative.    All other systems reviewed and are negative.   Today's Vitals   01/20/21 1519  BP: 124/76  Pulse: 80  Temp: 99 F (37.2 C)  Weight: 174 lb 6.4 oz (79.1 kg)  Height: 5' (1.524 m)  PainSc: 8   PainLoc:  Hip   Body mass index is 34.06 kg/m.  Wt Readings from Last 3 Encounters:  01/20/21 174 lb 6.4 oz (79.1 kg)  09/02/20 171 lb 3.2 oz (77.7 kg)  07/15/20 169 lb (76.7 kg)    BP Readings from Last 3 Encounters:  01/20/21 124/76  09/02/20 128/76  07/15/20 (!) 104/52    Objective:  Physical Exam Vitals and nursing note reviewed.  Constitutional:      Appearance: Normal appearance.  HENT:     Head: Normocephalic and atraumatic.     Nose:     Comments: Masked     Mouth/Throat:     Comments: Masked  Cardiovascular:     Rate and Rhythm: Normal rate and regular rhythm.     Heart sounds: Normal heart sounds.  Pulmonary:  Effort: Pulmonary effort is normal.     Breath sounds: Normal breath sounds.     Comments: Decreased breath sounds at the bases Musculoskeletal:     Cervical back: Normal range of motion.  Skin:    General: Skin is warm.  Neurological:     General: No focal deficit present.     Mental Status: She is alert.  Psychiatric:        Mood and Affect: Mood normal.        Behavior: Behavior normal.        Assessment And Plan:     1. Type 2 diabetes mellitus with stage 3a chronic kidney disease, without long-term current use of insulin (HCC) Comments: Chronic, I will check labs as listed below. Encouraged to limit her intake of sweetened beverages. She/daughter agree to CCM referral.  - Hemoglobin A1c - BMP8+EGFR - AMB Referral to [facility]  2. Hypertensive nephropathy Comments: Chronic, well controlled. No med changes. Encouraged to avoid adding salt to her foods.  - AMB Referral to [facility]  3. Empty sella ([place]) Comments: Seen on brain MRI.  Appears to be asymptomatic.   4. Class 1 obesity due to excess calories with serious comorbidity and body mass index (BMI) of 34.0 to 34.9 in adult Comments: She is encouraged to strive for BMI less than 30 to decrease cardiac risk.   5. Immunization due Comments: I will send  Prevnar-20 and Tdap(boostrix) to her local pharmacy.   Patient was given opportunity to ask questions. Patient verbalized understanding of the plan and was able to repeat key elements of the plan. All questions were answered to their satisfaction.   I, Maximino Greenland, MD, have reviewed all documentation for this visit. The documentation on 01/23/21 for the exam, diagnosis, procedures, and orders are all accurate and complete.   IF YOU HAVE BEEN REFERRED TO A SPECIALIST, IT MAY TAKE 1-2 WEEKS TO SCHEDULE/PROCESS THE REFERRAL. IF YOU HAVE NOT HEARD FROM US/SPECIALIST IN TWO WEEKS, PLEASE GIVE [place] A CALL AT [phone].   THE PATIENT IS ENCOURAGED TO PRACTICE SOCIAL DISTANCING DUE TO THE COVID-19 PANDEMIC.

## 2021-01-20 NOTE — Patient Instructions (Signed)

## 2021-01-21 ENCOUNTER — Telehealth: Payer: Self-pay | Admitting: *Deleted

## 2021-01-21 LAB — BMP8+EGFR
BUN/Creatinine Ratio: 19 (ref 12–28)
BUN: 19 mg/dL (ref 10–36)
CO2: 26 mmol/L (ref 20–29)
Calcium: 9.6 mg/dL (ref 8.7–10.3)
Chloride: 105 mmol/L (ref 96–106)
Creatinine, Ser: 1.02 mg/dL — ABNORMAL HIGH (ref 0.57–1.00)
Glucose: 88 mg/dL (ref 70–99)
Potassium: 4.1 mmol/L (ref 3.5–5.2)
Sodium: 146 mmol/L — ABNORMAL HIGH (ref 134–144)
eGFR: 52 mL/min/{1.73_m2} — ABNORMAL LOW (ref >59)

## 2021-01-21 LAB — HEMOGLOBIN A1C
Est. average glucose Bld gHb Est-mCnc: 148 mg/dL
Hgb A1c MFr Bld: 6.8 % — ABNORMAL HIGH (ref 4.8–5.6)

## 2021-01-21 NOTE — Chronic Care Management (AMB) (Signed)
  Chronic Care Management   Outreach Note  01/21/2021 Name: Leah Wiggins MRN: [id number] DOB: 06-Sep-1926  [name] is a 87 y.o. year old female who is a primary care patient of Glendale Chard, MD. I reached out to [name] by phone today in response to a referral sent by Ms. Nilda Riggs primary care provider.  An unsuccessful telephone outreach was attempted today. The patient was referred to the case management team for assistance with care management and care coordination.   Follow Up Plan: A HIPAA compliant phone message was left for the patient providing contact information and requesting a return call.  The care management team will reach out to the patient again over the next 7 days.  If patient returns call to provider office, please advise to call [place]* at [phone].*  [facility]  Direct Dial: [phone]

## 2021-01-27 NOTE — Chronic Care Management (AMB) (Signed)
  Chronic Care Management   Outreach Note  01/27/2021 Name: [name] MRN: [id number] DOB: December 29, 1926  Leah Wiggins is a 87 y.o. year old female who is a primary care patient of Glendale Chard, MD. I reached out to Leah Wiggins by phone today in response to a referral sent by Ms. Nilda Riggs primary care provider.  A second unsuccessful telephone outreach was attempted today. The patient was referred to the case management team for assistance with care management and care coordination.   Follow Up Plan: A HIPAA compliant phone message was left for the patient providing contact information and requesting a return call.  The care management team will reach out to the patient again over the next 7 days.  If patient returns call to provider office, please advise to call [facility]* at [phone].*  [facility] Management  Direct Dial: [phone]

## 2021-02-02 NOTE — Chronic Care Management (AMB) (Signed)
  Chronic Care Management   Outreach Note  02/02/2021 Name: [name] MRN: [id number] DOB: 06-02-1926  Leah Wiggins is a 87 y.o. year old female who is a primary care patient of Glendale Chard, MD. I reached out to Leah Wiggins by phone today in response to a referral sent by Ms. Nilda Riggs primary care provider.  Third unsuccessful telephone outreach was attempted today. The patient was referred to the case management team for assistance with care management and care coordination. The patient's primary care provider has been notified of our unsuccessful attempts to make or maintain contact with the patient. The care management team is pleased to engage with this patient at any time in the future should he/she be interested in assistance from the care management team.   Follow Up Plan: We have been unable to make contact with the patient. The care management team is available to follow up with the patient after provider conversation with the patient regarding recommendation for care management engagement and subsequent re-referral to the care management team. A HIPAA compliant phone message was left for the patient providing contact information and requesting a return call. If patient returns call to provider office, please advise to call [place] at [place]  Direct Dial: [phone]

## 2021-02-05 ENCOUNTER — Other Ambulatory Visit: Payer: Self-pay | Admitting: Internal Medicine

## 2021-02-06 ENCOUNTER — Other Ambulatory Visit: Payer: Self-pay | Admitting: Internal Medicine

## 2021-02-06 MED ORDER — SPIRIVA RESPIMAT 2.5 MCG/ACT IN AERS: 2.0000 | INHALATION_SPRAY | Freq: Every day | RESPIRATORY_TRACT | 2 refills | Status: DC

## 2021-02-15 ENCOUNTER — Telehealth: Payer: Self-pay

## 2021-02-15 NOTE — Telephone Encounter (Signed)
I called the pt's daughter Leah Wiggins to see if she what the issue was with the [employer] form that was completed.

## 2021-02-17 ENCOUNTER — Telehealth: Payer: Self-pay

## 2021-02-17 NOTE — Telephone Encounter (Signed)
The pt's daughter Rockie Neighbours was asked what was the Issue with the [employer] form and she said that the form has been approved and that she will check to make sure. She was told a copy would be kept for the chart and for her to pickup.

## 2021-03-03 ENCOUNTER — Other Ambulatory Visit: Payer: Self-pay

## 2021-03-03 ENCOUNTER — Ambulatory Visit (INDEPENDENT_AMBULATORY_CARE_PROVIDER_SITE_OTHER): Payer: Medicare Other

## 2021-03-03 VITALS — Temp 98.4°F | Ht 59.6 in | Wt 174.2 lb

## 2021-03-03 DIAGNOSIS — Z0000 Encounter for general adult medical examination without abnormal findings: Secondary | ICD-10-CM

## 2021-03-03 NOTE — Progress Notes (Signed)
This visit occurred during the SARS-CoV-2 public health emergency.  Safety protocols were in place, including screening questions prior to the visit, additional usage of staff PPE, and extensive cleaning of exam room while observing appropriate contact time as indicated for disinfecting solutions.  Subjective:   Leah Wiggins is a 87 y.o. female who presents for Medicare Annual (Subsequent) preventive examination.  Review of Systems     Cardiac Risk Factors include: advanced age (>105men, >26 women);diabetes mellitus;hypertension;obesity (BMI >30kg/m2)     Objective:    Today's Vitals   03/03/21 1025  Temp: 98.4 F (36.9 C)  TempSrc: Oral  Weight: 174 lb 3.2 oz (79 kg)  Height: 4' 11.6" (1.514 m)   Body mass index is 34.48 kg/m.  Advanced Directives 03/03/2021 02/26/2020 02/14/2019 05/05/2018 02/07/2018 02/07/2018 01/22/2018  Does Patient Have a Medical Advance Directive? No No No No No No No  Would patient like information on creating a medical advance directive? - No - Patient declined Yes (MAU/Ambulatory/Procedural Areas - Information given) No - Patient declined Yes (MAU/Ambulatory/Procedural Areas - Information given) No - Patient declined -    Current Medications (verified) Outpatient Encounter Medications as of 03/03/2021  Medication Sig   albuterol (PROVENTIL) (2.5 MG/3ML) 0.083% nebulizer solution Take 3 mLs (2.5 mg total) by nebulization every 4 (four) hours as needed for wheezing or shortness of breath.   alendronate (FOSAMAX) 70 MG tablet TAKE 1 TABLET EVERY WEDNESDAY WITH A FULL GLASS OF WATER ON AN EMPTY STOMACH   aspirin EC 81 MG tablet Take 81 mg by mouth daily.   betamethasone dipropionate (DIPROLENE) 0.05 % ointment    Blood Glucose Monitoring Suppl (FREESTYLE FREEDOM LITE) w/Device KIT Use to check blood sugar 3 times a day. Dx code e11.65   clotrimazole-betamethasone (LOTRISONE) cream Apply to affected area 2 times daily prn   Fluocinolone Acetonide 0.01  % OIL    gabapentin (NEURONTIN) 100 MG capsule TAKE 1 CAPSULE NIGHTLY   glucose blood (FREESTYLE LITE) test strip Use to check blood sugar 3 times a day. Dx code e11.65   hydrocortisone 2.5 % cream    metFORMIN (GLUCOPHAGE-XR) 500 MG 24 hr tablet TAKE 1 TABLET TWICE A DAY   MICARDIS 40 MG tablet TAKE 1 TABLET DAILY   mometasone (ELOCON) 0.1 % lotion Apply 1 application topically daily as needed (IRRITATION).    mupirocin ointment (BACTROBAN) 2 % 1 application as needed.    pantoprazole (PROTONIX) 40 MG tablet TAKE 1 TABLET DAILY AT 12 NOON   pravastatin (PRAVACHOL) 40 MG tablet TAKE 1 TABLET DAILY   Spacer/Aero-Holding Chambers (AEROCHAMBER PLUS) inhaler Use as instructed   Tiotropium Bromide Monohydrate (SPIRIVA RESPIMAT) 2.5 MCG/ACT AERS Inhale 2 puffs into the lungs daily.   triamcinolone (KENALOG) 0.1 %    ferrous sulfate 325 (65 FE) MG EC tablet Take 1 tablet (325 mg total) by mouth 3 (three) times daily with meals. (Patient taking differently: Take 325 mg by mouth 3 (three) times daily with meals. 1 daily)   ketorolac (TORADOL) 15 MG/ML injection Inject 15 mg into the muscle once. (Patient not taking: Reported on 01/20/2021)   memantine (NAMENDA) 10 MG tablet Take 1 tablet (10 mg total) by mouth 2 (two) times daily. (Patient not taking: Reported on 01/20/2021)   No facility-administered encounter medications on file as of 03/03/2021.    Allergies (verified) Bactrim [sulfamethoxazole-trimethoprim] and Penicillins   History: Past Medical History:  Diagnosis Date   Arthritis    "shoulders" (05/04/2017)   Breast cancer, right  breast ([place]) 1991   GERD (gastroesophageal reflux disease)    Hard of hearing    Hyperlipidemia    Hypertension    Memory loss    Non-stress test nonreactive, 04/24/12, normal 12/10/2012   OSA on CPAP    "don't wear it all the time" (05/04/2017)   PVD (peripheral vascular disease) ([place])    Tobacco use 12/10/2012   Type II diabetes mellitus ([place])    Past  Surgical History:  Procedure Laterality Date   APPENDECTOMY     BIOPSY  01/19/2018   Procedure: BIOPSY;  Surgeon: Carol Ada, MD;  Location: [facility];  Service: Endoscopy;;   BREAST BIOPSY Right 1991   CARDIOVASCULAR STRESS TEST  04/24/2012   No significant ST segment change suggestive of ischemia.   CATARACT EXTRACTION, BILATERAL Bilateral    COLONOSCOPY WITH PROPOFOL N/A 01/19/2018   Procedure: COLONOSCOPY WITH PROPOFOL;  Surgeon: Carol Ada, MD;  Location: [facility];  Service: Endoscopy;  Laterality: N/A;   ESOPHAGOGASTRODUODENOSCOPY (EGD) WITH PROPOFOL N/A 01/19/2018   Procedure: ESOPHAGOGASTRODUODENOSCOPY (EGD) WITH PROPOFOL;  Surgeon: Carol Ada, MD;  Location: [facility];  Service: Endoscopy;  Laterality: N/A;   JOINT REPLACEMENT     LOWER EXTREMITY ARTERIAL DOPPLER Bilateral 05/07/2012   Left ABI-demonstrated mild arterial insufficiency. Right CIA 50-69% diameter reduction. Right SFA less than 50% diameter reduction. Left SFA 70-99% diameter reduction. Bilateral Runoff-Posterior tibial arteries appeared occluded.   MASTECTOMY PARTIAL / LUMPECTOMY W/ AXILLARY LYMPHADENECTOMY Right 1991    TOTAL HIP ARTHROPLASTY Right 2011   Family History  Problem Relation Age of Onset   Heart disease Mother    Stroke Mother    Stroke Father    Cancer Sister        GIST   Cancer Brother        thyroid    Diabetes Son    Stroke Son    Social History   Socioeconomic History   Marital status: Widowed    Spouse name: Not on file   Number of children: 8   Years of education: 8th grade   Highest education level: Not on file  Occupational History   Occupation: retired  Tobacco Use   Smoking status: Former    Packs/day: 0.25    Years: 70.00    Pack years: 17.50    Types: Cigarettes    Start date: 03/08/1947   Smokeless tobacco: Never   Tobacco comments:    has tried cold [place]  Vaping Use   Vaping Use: Never used  Substance and Sexual Activity   Alcohol use: No     Alcohol/week: 0.0 standard drinks   Drug use: No   Sexual activity: Not Currently  Other Topics Concern   Not on file  Social History Narrative   Lives at home with her son.   She had eight children (two sets of twins).   No daily use of caffeine.   Right-handed.   Social Determinants of Health   Financial Resource Strain: Low Risk    Difficulty of Paying Living Expenses: Not hard at all  Food Insecurity: No Food Insecurity   Worried About Charity fundraiser in the Last Year: Never true   [place] in the Last Year: Never true  Transportation Needs: No Transportation Needs   Lack of Transportation (Medical): No   Lack of Transportation (Non-Medical): No  Physical Activity: Inactive   Days of Exercise per Week: 0 days   Minutes of Exercise per Session: 0 min  Stress: No Stress Concern Present   Feeling of Stress : Not at all  Social Connections: Not on file    Tobacco Counseling Counseling given: Not Answered Tobacco comments: has tried cold [place]   Clinical Intake:  Pre-visit preparation completed: Yes  Pain : No/denies pain     Nutritional Status: BMI > 30  Obese Nutritional Risks: None Diabetes: Yes  How often do you need to have someone help you when you read instructions, pamphlets, or other written materials from your doctor or pharmacy?: 1 - Never  Diabetic? Yes Nutrition Risk Assessment:  Has the patient had any N/V/D within the last 2 months?  No  Does the patient have any non-healing wounds?  No  Has the patient had any unintentional weight loss or weight gain?  No   Diabetes:  Is the patient diabetic?  Yes  If diabetic, was a CBG obtained today?  No  Did the patient bring in their glucometer from home?  No  How often do you monitor your CBG's? daily.   Financial Strains and Diabetes Management:  Are you having any financial strains with the device, your supplies or your medication? No .  Does the patient want to be seen by Chronic  Care Management for management of their diabetes?  No  Would the patient like to be referred to a Nutritionist or for Diabetic Management?  No   Diabetic Exams:  Diabetic Eye Exam: Completed 12/15/2020 Diabetic Foot Exam: Completed 06/04/2020   Interpreter Needed?: No  Information entered by :: NAllen LPN   Activities of Daily Living In your present state of health, do you have any difficulty performing the following activities: 03/03/2021  Hearing? Y  Comment usually has hearing aide  Vision? Y  Comment sees shadows at times  Difficulty concentrating or making decisions? Y  Walking or climbing stairs? Y  Dressing or bathing? N  Doing errands, shopping? Y  Preparing Food and eating ? N  Using the Toilet? N  In the past six months, have you accidently leaked urine? Y  Do you have problems with loss of bowel control? N  Managing your Medications? Y  Managing your Finances? Y  Housekeeping or managing your Housekeeping? Y  Some recent data might be hidden    Patient Care Team: Glendale Chard, MD as PCP - General (Internal Medicine)  Indicate any recent Medical Services you may have received from other than [facility] providers in the past year (date may be approximate).     Assessment:   This is a routine wellness examination for Ragan.  Hearing/Vision screen Vision Screening - Comments:: Regular eye exams, Dr. Gershon Crane  Dietary issues and exercise activities discussed: Current Exercise Habits: The patient does not participate in regular exercise at present   Goals Addressed             This Visit's Progress    Patient Stated       03/03/2021, start eating healthy       Depression Screen PHQ 2/9 Scores 03/03/2021 02/26/2020 05/20/2019 02/14/2019 02/07/2018 04/15/2013  PHQ - 2 Score 0 0 0 0 0 0  PHQ- 9 Score - - - 0 - -    Fall Risk Fall Risk  03/03/2021 02/26/2020 05/20/2019 02/14/2019 10/31/2018  Falls in the past year? 0 0 1 0 1  Number falls in past yr: - -  1 - 0  Injury with Fall? - - 1 - 1  Risk for fall due to : Medication side effect  Impaired balance/gait;Medication side effect - Medication side effect -  Follow up Falls evaluation completed;Education provided;Falls prevention discussed Falls evaluation completed;Education provided;Falls prevention discussed - Falls evaluation completed;Education provided;Falls prevention discussed -    FALL RISK PREVENTION PERTAINING TO THE HOME:  Any stairs in or around the home? Yes  If so, are there any without handrails? No  Home free of loose throw rugs in walkways, pet beds, electrical cords, etc? Yes  Adequate lighting in your home to reduce risk of falls? Yes   ASSISTIVE DEVICES UTILIZED TO PREVENT FALLS:  Life alert? No  Use of a cane, walker or w/c? No  Grab bars in the bathroom? Yes  Shower chair or bench in shower? Yes  Elevated toilet seat or a handicapped toilet? Yes   TIMED UP AND GO:  Was the test performed? No .    Gait slow and steady without use of assistive device  Cognitive Function: MMSE - Mini Mental State Exam 12/23/2019 06/24/2019  Orientation to time 4 5  Orientation to Place 5 5  Registration 3 3  Attention/ Calculation 0 2  Recall 2 0  Language- name 2 objects 2 2  Language- repeat 0 1  Language- follow 3 step command 3 3  Language- read & follow direction 1 1  Write a sentence 1 1  Copy design 0 0  Total score 21 23     6CIT Screen 03/03/2021 02/26/2020 02/14/2019 02/07/2018  What Year? 0 points 0 points 0 points 0 points  What month? 0 points 0 points 0 points 0 points  What time? 0 points 3 points 3 points 0 points  Count back from 20 0 points 0 points 0 points 0 points  Months in reverse 4 points 2 points 4 points 4 points  Repeat phrase 8 points 4 points 10 points 2 points  Total Score $RemoveBef'12 9 17 6    'NfGqgzNmTL$ Immunizations Immunization History  Administered Date(s) Administered   Influenza Whole 02/16/2010   PFIZER(Purple Top)SARS-COV-2 Vaccination  04/29/2019, 05/20/2019, 01/09/2020, 09/22/2020   PNEUMOCOCCAL CONJUGATE-20 01/25/2021   Pneumococcal-Unspecified 06/19/2013   Tdap 01/25/2021    TDAP status: Up to date  Flu Vaccine status: Declined, Education has been provided regarding the importance of this vaccine but patient still declined. Advised may receive this vaccine at local pharmacy or Health Dept. Aware to provide a copy of the vaccination record if obtained from local pharmacy or Health Dept. Verbalized acceptance and understanding.  Pneumococcal vaccine status: Up to date  Covid-19 vaccine status: Completed vaccines  Qualifies for Shingles Vaccine? Yes   Zostavax completed No   Shingrix Completed?: No.    Education has been provided regarding the importance of this vaccine. Patient has been advised to call insurance company to determine out of pocket expense if they have not yet received this vaccine. Advised may also receive vaccine at local pharmacy or Health Dept. Verbalized acceptance and understanding.  Screening Tests Health Maintenance  Topic Date Due   Zoster Vaccines- Shingrix (1 of 2) Never done   COVID-19 Vaccine (5 - Booster for Pfizer series) 11/17/2020   INFLUENZA VACCINE  06/04/2021 (Originally 10/05/2020)   FOOT EXAM  06/04/2021   HEMOGLOBIN A1C  07/20/2021   OPHTHALMOLOGY EXAM  12/15/2021   TETANUS/TDAP  01/26/2031   Pneumonia Vaccine 57+ Years old  Completed   DEXA SCAN  Completed   HPV VACCINES  Aged Out    Health Maintenance  Health Maintenance Due  Topic Date Due   Zoster Vaccines- Shingrix (  1 of 2) Never done   COVID-19 Vaccine (5 - Booster for Pfizer series) 11/17/2020    Colorectal cancer screening: No longer required.   Mammogram status: No longer required due to age.  Bone Density status: Completed 04/02/2001.   Lung Cancer Screening: (Low Dose CT Chest recommended if Age 33-80 years, 30 pack-year currently smoking OR have quit w/in 15years.) does not qualify.   Lung Cancer  Screening Referral: no  Additional Screening:  Hepatitis C Screening: does not qualify;   Vision Screening: Recommended annual ophthalmology exams for early detection of glaucoma and other disorders of the eye. Is the patient up to date with their annual eye exam?  Yes  Who is the provider or what is the name of the office in which the patient attends annual eye exams? Dr. Gershon Crane If pt is not established with a provider, would they like to be referred to a provider to establish care? No .   Dental Screening: Recommended annual dental exams for proper oral hygiene  Community Resource Referral / Chronic Care Management: CRR required this visit?  No   CCM required this visit?  No      Plan:     I have personally reviewed and noted the following in the patients chart:   Medical and social history Use of alcohol, tobacco or illicit drugs  Current medications and supplements including opioid prescriptions.  Functional ability and status Nutritional status Physical activity Advanced directives List of other physicians Hospitalizations, surgeries, and ER visits in previous 12 months Vitals Screenings to include cognitive, depression, and falls Referrals and appointments  In addition, I have reviewed and discussed with patient certain preventive protocols, quality metrics, and best practice recommendations. A written personalized care plan for preventive services as well as general preventive health recommendations were provided to patient.     Kellie Simmering, LPN   [id number]   Nurse Notes: none

## 2021-03-03 NOTE — Addendum Note (Signed)
Addended by: Kellie Simmering on: 03/03/2021 03:37 PM   Modules accepted: Orders

## 2021-03-03 NOTE — Patient Instructions (Signed)
Ms. Leah Wiggins , Thank you for taking time to come for your Medicare Wellness Visit. I appreciate your ongoing commitment to your health goals. Please review the following plan we discussed and let me know if I can assist you in the future.   Screening recommendations/referrals: Colonoscopy: not required Mammogram: not required Bone Density: completed 04/02/2001 Recommended yearly ophthalmology/optometry visit for glaucoma screening and checkup Recommended yearly dental visit for hygiene and checkup  Vaccinations: Influenza vaccine: decline Pneumococcal vaccine: completed 01/25/2021 Tdap vaccine: completed 01/25/2021 Shingles vaccine: discussed   Covid-19: 09/22/2020, 01/09/2020, 05/20/2019, 04/29/2019  Advanced directives: Advance directive discussed with you today. Even though you declined this today please call our office should you change your mind and we can give you the proper paperwork for you to fill out.  Conditions/risks identified: none  Next appointment: Follow up in one year for your annual wellness visit    Preventive Care 65 Years and Older, Female Preventive care refers to lifestyle choices and visits with your health care provider that can promote health and wellness. What does preventive care include? A yearly physical exam. This is also called an annual well check. Dental exams once or twice a year. Routine eye exams. Ask your health care provider how often you should have your eyes checked. Personal lifestyle choices, including: Daily care of your teeth and gums. Regular physical activity. Eating a healthy diet. Avoiding tobacco and drug use. Limiting alcohol use. Practicing safe sex. Taking low-dose aspirin every day. Taking vitamin and mineral supplements as recommended by your health care provider. What happens during an annual well check? The services and screenings done by your health care provider during your annual well check will depend on your age, overall  health, lifestyle risk factors, and family history of disease. Counseling  Your health care provider may ask you questions about your: Alcohol use. Tobacco use. Drug use. Emotional well-being. Home and relationship well-being. Sexual activity. Eating habits. History of falls. Memory and ability to understand (cognition). Work and work Statistician. Reproductive health. Screening  You may have the following tests or measurements: Height, weight, and BMI. Blood pressure. Lipid and cholesterol levels. These may be checked every 5 years, or more frequently if you are over 1 years old. Skin check. Lung cancer screening. You may have this screening every year starting at age 19 if you have a 30-pack-year history of smoking and currently smoke or have quit within the past 15 years. Fecal occult blood test (FOBT) of the stool. You may have this test every year starting at age 53. Flexible sigmoidoscopy or colonoscopy. You may have a sigmoidoscopy every 5 years or a colonoscopy every 10 years starting at age 63. Hepatitis C blood test. Hepatitis B blood test. Sexually transmitted disease (STD) testing. Diabetes screening. This is done by checking your blood sugar (glucose) after you have not eaten for a while (fasting). You may have this done every 1-3 years. Bone density scan. This is done to screen for osteoporosis. You may have this done starting at age 71. Mammogram. This may be done every 1-2 years. Talk to your health care provider about how often you should have regular mammograms. Talk with your health care provider about your test results, treatment options, and if necessary, the need for more tests. Vaccines  Your health care provider may recommend certain vaccines, such as: Influenza vaccine. This is recommended every year. Tetanus, diphtheria, and acellular pertussis (Tdap, Td) vaccine. You may need a Td booster every 10 years. Zoster vaccine.  You may need this after age  36. Pneumococcal 13-valent conjugate (PCV13) vaccine. One dose is recommended after age 33. Pneumococcal polysaccharide (PPSV23) vaccine. One dose is recommended after age 57. Talk to your health care provider about which screenings and vaccines you need and how often you need them. This information is not intended to replace advice given to you by your health care provider. Make sure you discuss any questions you have with your health care provider. Document Released: 03/20/2015 Document Revised: 11/11/2015 Document Reviewed: 12/23/2014 Elsevier Interactive Patient Education  2017 [employer] Prevention in the Home Falls can cause injuries. They can happen to people of all ages. There are many things you can do to make your home safe and to help prevent falls. What can I do on the outside of my home? Regularly fix the edges of walkways and driveways and fix any cracks. Remove anything that might make you trip as you walk through a door, such as a raised step or threshold. Trim any bushes or trees on the path to your home. Use bright outdoor lighting. Clear any walking paths of anything that might make someone trip, such as rocks or tools. Regularly check to see if handrails are loose or broken. Make sure that both sides of any steps have handrails. Any raised decks and porches should have guardrails on the edges. Have any leaves, snow, or ice cleared regularly. Use sand or salt on walking paths during winter. Clean up any spills in your garage right away. This includes oil or grease spills. What can I do in the bathroom? Use night lights. Install grab bars by the toilet and in the tub and shower. Do not use towel bars as grab bars. Use non-skid mats or decals in the tub or shower. If you need to sit down in the shower, use a plastic, non-slip stool. Keep the floor dry. Clean up any water that spills on the floor as soon as it happens. Remove soap buildup in the tub or shower  regularly. Attach bath mats securely with double-sided non-slip rug tape. Do not have throw rugs and other things on the floor that can make you trip. What can I do in the bedroom? Use night lights. Make sure that you have a light by your bed that is easy to reach. Do not use any sheets or blankets that are too big for your bed. They should not hang down onto the floor. Have a firm chair that has side arms. You can use this for support while you get dressed. Do not have throw rugs and other things on the floor that can make you trip. What can I do in the kitchen? Clean up any spills right away. Avoid walking on wet floors. Keep items that you use a lot in easy-to-reach places. If you need to reach something above you, use a strong step stool that has a grab bar. Keep electrical cords out of the way. Do not use floor polish or wax that makes floors slippery. If you must use wax, use non-skid floor wax. Do not have throw rugs and other things on the floor that can make you trip. What can I do with my stairs? Do not leave any items on the stairs. Make sure that there are handrails on both sides of the stairs and use them. Fix handrails that are broken or loose. Make sure that handrails are as long as the stairways. Check any carpeting to make sure that it is  firmly attached to the stairs. Fix any carpet that is loose or worn. Avoid having throw rugs at the top or bottom of the stairs. If you do have throw rugs, attach them to the floor with carpet tape. Make sure that you have a light switch at the top of the stairs and the bottom of the stairs. If you do not have them, ask someone to add them for you. What else can I do to help prevent falls? Wear shoes that: Do not have high heels. Have rubber bottoms. Are comfortable and fit you well. Are closed at the toe. Do not wear sandals. If you use a stepladder: Make sure that it is fully opened. Do not climb a closed stepladder. Make sure that  both sides of the stepladder are locked into place. Ask someone to hold it for you, if possible. Clearly mark and make sure that you can see: Any grab bars or handrails. First and last steps. Where the edge of each step is. Use tools that help you move around (mobility aids) if they are needed. These include: Canes. Walkers. Scooters. Crutches. Turn on the lights when you go into a dark area. Replace any light bulbs as soon as they burn out. Set up your furniture so you have a clear path. Avoid moving your furniture around. If any of your floors are uneven, fix them. If there are any pets around you, be aware of where they are. Review your medicines with your doctor. Some medicines can make you feel dizzy. This can increase your chance of falling. Ask your doctor what other things that you can do to help prevent falls. This information is not intended to replace advice given to you by your health care provider. Make sure you discuss any questions you have with your health care provider. Document Released: 12/18/2008 Document Revised: 07/30/2015 Document Reviewed: 03/28/2014 Elsevier Interactive Patient Education  2017 [employer].

## 2021-03-18 ENCOUNTER — Ambulatory Visit (INDEPENDENT_AMBULATORY_CARE_PROVIDER_SITE_OTHER): Payer: Medicare Other | Admitting: Sports Medicine

## 2021-03-18 ENCOUNTER — Encounter: Payer: Self-pay | Admitting: Sports Medicine

## 2021-03-18 ENCOUNTER — Other Ambulatory Visit: Payer: Self-pay

## 2021-03-18 DIAGNOSIS — M79609 Pain in unspecified limb: Secondary | ICD-10-CM | POA: Diagnosis not present

## 2021-03-18 DIAGNOSIS — I739 Peripheral vascular disease, unspecified: Secondary | ICD-10-CM

## 2021-03-18 DIAGNOSIS — E1122 Type 2 diabetes mellitus with diabetic chronic kidney disease: Secondary | ICD-10-CM

## 2021-03-18 DIAGNOSIS — B351 Tinea unguium: Secondary | ICD-10-CM | POA: Diagnosis not present

## 2021-03-18 DIAGNOSIS — N182 Chronic kidney disease, stage 2 (mild): Secondary | ICD-10-CM

## 2021-03-18 NOTE — Progress Notes (Signed)
Subjective: Leah Wiggins is a 88 y.o. female patient with history of diabetes who presents to office today complaining of long,mildly painful nails  while ambulating in shoes; unable to trim. FBS not recorded and last office visit November with PCP A1c recorded at 6.8.  Patient is assisted by daughter this visit.  Patient Active Problem List   Diagnosis Date Noted   Hypertensive nephropathy 09/03/2020   Type 2 diabetes mellitus with stage 2 chronic kidney disease, without long-term current use of insulin ([place]) 09/03/2020   Atherosclerosis of aorta ([place]) 09/03/2020   Absolute anemia 09/03/2020   Cellulitis of left foot 09/03/2020   Class 1 obesity due to excess calories with serious comorbidity and body mass index (BMI) of 33.0 to 33.9 in adult 09/03/2020   Abnormal feces 06/04/2020   Abnormal weight loss 06/04/2020   Angiodysplasia of intestine 06/04/2020   Colon cancer screening 06/04/2020   Gastroesophageal reflux disease 06/04/2020   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 hearing of right ear 06/04/2020   Prolapsed internal hemorrhoids 06/04/2020   Rectal bleeding 06/04/2020   Sensorineural hearing loss (SNHL) of right ear with restricted hearing of left ear 06/04/2020   Mild cognitive impairment 12/23/2019   Memory loss 06/24/2019   Intrinsic atopic dermatitis 09/18/2018   Chronic obstructive pulmonary disease ([place]) 04/06/2018   Prediabetes 02/07/2018   Iron deficiency anemia due to chronic blood loss 12/25/2017   Anaphylaxis 05/04/2017   Rash and nonspecific skin eruption 05/04/2017   History of breast cancer 08/14/2014   Empty sella ([place]) 07/14/2014   Pain in the chest 04/28/2014   Diabetes mellitus without complication ([place]) 01/65/5374   Chest pain 03/03/2014   Essential hypertension 06/24/2013   PAD (peripheral artery disease), abnormal ABIs 12/10/2012   Non-stress test nonreactive, 04/24/12, normal 12/10/2012   Dyslipidemia  12/10/2012   Tobacco use 12/10/2012   Thrombocytopenia ([place]) 01/24/2012   RESTLESS LEG SYNDROME 02/16/2010   METHICILLIN SUSECPTIBLE PNEUMONIA STAPH AUREUS 01/13/2010   HIP THI LEG&ANK ABRASION/FRICION BURN W/O INF 01/13/2010   Current Outpatient Medications on File Prior to Visit  Medication Sig Dispense Refill   albuterol (PROVENTIL) (2.5 MG/3ML) 0.083% nebulizer solution Take 3 mLs (2.5 mg total) by nebulization every 4 (four) hours as needed for wheezing or shortness of breath. 75 mL 2   alendronate (FOSAMAX) 70 MG tablet TAKE 1 TABLET EVERY WEDNESDAY WITH A FULL GLASS OF WATER ON AN EMPTY STOMACH 12 tablet 3   aspirin EC 81 MG tablet Take 81 mg by mouth daily.     betamethasone dipropionate (DIPROLENE) 0.05 % ointment      Blood Glucose Monitoring Suppl (FREESTYLE FREEDOM LITE) w/Device KIT Use to check blood sugar 3 times a day. Dx code e11.65 1 kit 3   clotrimazole-betamethasone (LOTRISONE) cream Apply to affected area 2 times daily prn 30 g 0   ferrous sulfate 325 (65 FE) MG EC tablet Take 1 tablet (325 mg total) by mouth 3 (three) times daily with meals. (Patient taking differently: Take 325 mg by mouth 3 (three) times daily with meals. 1 daily) 90 tablet 2   Fluocinolone Acetonide 0.01 % OIL      gabapentin (NEURONTIN) 100 MG capsule TAKE 1 CAPSULE NIGHTLY 90 capsule 3   glucose blood (FREESTYLE LITE) test strip Use to check blood sugar 3 times a day. Dx code e11.65 150 each 3   hydrocortisone 2.5 % cream      ketorolac (TORADOL) 15 MG/ML injection Inject 15  mg into the muscle once. (Patient not taking: Reported on 01/20/2021)     memantine (NAMENDA) 10 MG tablet Take 1 tablet (10 mg total) by mouth 2 (two) times daily. (Patient not taking: Reported on 01/20/2021) 60 tablet 11   metFORMIN (GLUCOPHAGE-XR) 500 MG 24 hr tablet TAKE 1 TABLET TWICE A DAY 180 tablet 3   MICARDIS 40 MG tablet TAKE 1 TABLET DAILY 90 tablet 3   mometasone (ELOCON) 0.1 % lotion Apply 1 application topically  daily as needed (IRRITATION).   0   mupirocin ointment (BACTROBAN) 2 % 1 application as needed.   0   pantoprazole (PROTONIX) 40 MG tablet TAKE 1 TABLET DAILY AT 12 NOON 90 tablet 3   pravastatin (PRAVACHOL) 40 MG tablet TAKE 1 TABLET DAILY 90 tablet 3   Spacer/Aero-Holding Chambers (AEROCHAMBER PLUS) inhaler Use as instructed 1 each 2   Tiotropium Bromide Monohydrate (SPIRIVA RESPIMAT) 2.5 MCG/ACT AERS Inhale 2 puffs into the lungs daily. 12 g 2   triamcinolone (KENALOG) 0.1 %      No current facility-administered medications on file prior to visit.   Allergies  Allergen Reactions   Bactrim [Sulfamethoxazole-Trimethoprim] Swelling    Marked angioedema requiring hospitalization   Penicillins Other (See Comments)    Has patient had a PCN reaction causing immediate rash, facial/tongue/throat swelling, SOB or lightheadedness with hypotension: Unk Has patient had a PCN reaction causing severe rash involving mucus membranes or skin necrosis: Unk Has patient had a PCN reaction that required hospitalization: Unk Has patient had a PCN reaction occurring within the last 10 years: No If all of the above answers are "NO", then may proceed with Cephalosporin use.    Recent Results (from the past 2160 hour(s))  Hemoglobin A1c     Status: Abnormal   Collection Time: 01/20/21  5:17 PM  Result Value Ref Range   Hgb A1c MFr Bld 6.8 (H) 4.8 - 5.6 %    Comment:          Prediabetes: 5.7 - 6.4          Diabetes: >6.4          Glycemic control for adults with diabetes: <7.0    Est. average glucose Bld gHb Est-mCnc 148 mg/dL  BMP8+EGFR     Status: Abnormal   Collection Time: 01/20/21  5:17 PM  Result Value Ref Range   Glucose 88 70 - 99 mg/dL   BUN 19 10 - 36 mg/dL   Creatinine, Ser 1.02 (H) 0.57 - 1.00 mg/dL   eGFR 52 (L) >59 mL/min/1.73   BUN/Creatinine Ratio 19 12 - 28   Sodium 146 (H) 134 - 144 mmol/L   Potassium 4.1 3.5 - 5.2 mmol/L   Chloride 105 96 - 106 mmol/L   CO2 26 20 - 29 mmol/L    Calcium 9.6 8.7 - 10.3 mg/dL     Objective: General: Patient is awake, alert, and oriented x 3 and in no acute distress.  Integument: Skin is warm, dry and supple bilateral. Nails are tender, long, thickened and dystrophic with subungual debris, consistent with onychomycosis, 1-5 bilateral. No signs of infection. No open lesions or preulcerative lesions present bilateral. Remaining integument unremarkable.  Vasculature:  Dorsalis Pedis pulse 1/4 bilateral. Posterior Tibial pulse  0/4 bilateral.  Capillary fill time <3 sec 1-5 bilateral. Scant hair growth to the level of the digits. Temperature gradient within normal limits. No varicosities present bilateral. No edema present bilateral.   Neurology: The patient has diminished sensation measured  with a 5.07/10g Semmes Weinstein Monofilament at all pedal sites bilateral . Vibratory sensation diminished bilateral with tuning fork.  Musculoskeletal: Asymptomatic bunion, hammertoes, pes planus pedal deformities noted bilateral. Muscular strength 4/5 in all lower extremity muscular groups bilateral without pain on range of motion. No tenderness with calf compression bilateral.  Assessment and Plan: Problem List Items Addressed This Visit       Endocrine   Type 2 diabetes mellitus with stage 2 chronic kidney disease, without long-term current use of insulin ([place])   Other Visit Diagnoses     Pain due to onychomycosis of nail    -  Primary   PVD (peripheral vascular disease) ([place])          -Examined patient. -Discussed and educated patient on diabetic foot care, especially with  regards to the vascular, neurological and musculoskeletal systems.  -Mechanically debrided all painful nails 1-5 bilateral using sterile nail nipper in length and girth and filed with dremel without incident  -Encouraged patient again to continue with daily skin emollients for dry skin and to also try aloe socks -Answered all patient questions -Patient to return   in 3 months for at risk foot care -Patient advised to call the office if any problems or questions arise in the meantime.  Landis Martins, DPM

## 2021-03-30 ENCOUNTER — Other Ambulatory Visit: Payer: Self-pay | Admitting: Internal Medicine

## 2021-04-01 ENCOUNTER — Telehealth: Payer: Self-pay

## 2021-04-01 NOTE — Telephone Encounter (Signed)
Telephone encounter was:  Successful.  04/01/2021 Name: Leah Wiggins MRN: [id number] DOB: 1926/07/08  [name] is a 88 y.o. year old female who is a primary care patient of Dorothyann Peng, MD . The community resource team was consulted for assistance with  dental.  Care guide performed the following interventions:  I called pt regarding dental, however, pt advised I should speak to daughter [name] for more information. I left Ms. [name] a voicemail advising reason of call.  Instructed daughter to call [phone] at her earliest convenience.    Follow Up Plan:  Care guide will follow up with patient by phone over the next few days if daughter does not return call.  [facility] Guide, Embedded Care Coordination [facility]  [place], [place] [address]  Main Phone: [phone]   E-mail: [email or web address]  Website: [email or web address]

## 2021-04-06 ENCOUNTER — Telehealth: Payer: Self-pay

## 2021-04-06 NOTE — Telephone Encounter (Signed)
Telephone encounter was:  Unsuccessful.  04/06/2021 Name: KHLOEY PELLETT MRN: [id number] DOB: [birth year]  Unsuccessful outbound call made today to assist with:   Dental Needs  Outreach Attempt:  2nd Attempt  A HIPAA compliant voice message was left requesting a return call.  Instructed patient to call back at [phone] at their earliest convenience.  Per last call, Pt advised to talk to Daughter [name]. I left a voicemail on 1/26 and no return call back. I called [name] again today 1/31 and was unable to reach her. I left her another voicemail. I reached out to Daughter Happiness advising reason of call and she stated she will pass the message along to [place].  [facility] Guide, Embedded Care Coordination [facility]  [place], [place] [address]  Main Phone: [phone]   E-mail: [email or web address]  Website: [email or web address]

## 2021-04-13 ENCOUNTER — Telehealth: Payer: Self-pay

## 2021-04-13 NOTE — Telephone Encounter (Signed)
Telephone encounter was:  Successful.  04/13/2021 Name: Leah Wiggins MRN: [id number] DOB: May 22, 1926  [name] is a 88 y.o. year old female who is a primary care patient of Dorothyann Peng, MD . The community resource team was consulted for assistance with  Dental needs  Care guide performed the following interventions:  Pt declined resources: Pt advised daughter [name] will be taking care of her dental needs. I left a voicemail for Ms. [name] as well to advise this is what pt stated and that if any resoures are needed, to please call me back.  Follow Up Plan:  No further follow up planned at this time. The patient has been provided with needed resources.  [facility] Guide, Embedded Care Coordination [facility]  [place], [place] [address]  Main Phone: [phone]   E-mail: [email or web address]  Website: [email or web address]

## 2021-04-23 ENCOUNTER — Other Ambulatory Visit: Payer: Self-pay | Admitting: Internal Medicine

## 2021-05-03 ENCOUNTER — Other Ambulatory Visit: Payer: Self-pay | Admitting: Internal Medicine

## 2021-05-25 ENCOUNTER — Encounter: Payer: Self-pay | Admitting: Nurse Practitioner

## 2021-05-25 ENCOUNTER — Ambulatory Visit (INDEPENDENT_AMBULATORY_CARE_PROVIDER_SITE_OTHER): Payer: Medicare Other | Admitting: Internal Medicine

## 2021-05-25 ENCOUNTER — Other Ambulatory Visit: Payer: Self-pay

## 2021-05-25 ENCOUNTER — Encounter: Payer: Self-pay | Admitting: Internal Medicine

## 2021-05-25 VITALS — BP 124/80 | HR 74 | Temp 98.8°F | Ht 59.6 in | Wt 177.4 lb

## 2021-05-25 DIAGNOSIS — L659 Nonscarring hair loss, unspecified: Secondary | ICD-10-CM

## 2021-05-25 DIAGNOSIS — I129 Hypertensive chronic kidney disease with stage 1 through stage 4 chronic kidney disease, or unspecified chronic kidney disease: Secondary | ICD-10-CM | POA: Diagnosis not present

## 2021-05-25 DIAGNOSIS — N1831 Chronic kidney disease, stage 3a: Secondary | ICD-10-CM

## 2021-05-25 DIAGNOSIS — Z79899 Other long term (current) drug therapy: Secondary | ICD-10-CM

## 2021-05-25 DIAGNOSIS — R0609 Other forms of dyspnea: Secondary | ICD-10-CM

## 2021-05-25 DIAGNOSIS — D696 Thrombocytopenia, unspecified: Secondary | ICD-10-CM | POA: Diagnosis not present

## 2021-05-25 DIAGNOSIS — E1122 Type 2 diabetes mellitus with diabetic chronic kidney disease: Secondary | ICD-10-CM | POA: Diagnosis not present

## 2021-05-25 DIAGNOSIS — Z6835 Body mass index (BMI) 35.0-35.9, adult: Secondary | ICD-10-CM

## 2021-05-25 DIAGNOSIS — Z2821 Immunization not carried out because of patient refusal: Secondary | ICD-10-CM

## 2021-05-25 DIAGNOSIS — E6609 Other obesity due to excess calories: Secondary | ICD-10-CM

## 2021-05-25 NOTE — Patient Instructions (Signed)

## 2021-05-25 NOTE — Progress Notes (Signed)
?Rich Brave [place],acting as a Education administrator for Maximino Greenland, MD.,have documented all relevant documentation on the behalf of Maximino Greenland, MD,as directed by  Maximino Greenland, MD while in the presence of Maximino Greenland, MD.  ?This visit occurred during the SARS-CoV-2 public health emergency.  Safety protocols were in place, including screening questions prior to the visit, additional usage of staff PPE, and extensive cleaning of exam room while observing appropriate contact time as indicated for disinfecting solutions. ? ?Subjective:  ?  ? Patient ID: Leah Wiggins , female    DOB: 11-20-1926 , 88 y.o.   MRN: [id number] ? ? ?Chief Complaint  ?Patient presents with  ? Diabetes  ? Hypertension  ? ? ?HPI ? ?The patient is here today for a diabetes and blood pressure follow-up.  She is accompanied by her daughter today. She reports compliance with meds. . Patient also complains of SOB when shes walking to her bed.  ? ?Diabetes ?She presents for her follow-up diabetic visit. She has type 2 diabetes mellitus. There are no hypoglycemic associated symptoms. Pertinent negatives for diabetes include no blurred vision. There are no hypoglycemic complications. Risk factors for coronary artery disease include diabetes mellitus, dyslipidemia, hypertension, obesity, sedentary lifestyle and post-menopausal.  ?Hypertension ?This is a chronic problem. The current episode started more than 1 year ago. The problem has been gradually improving since onset. The problem is controlled. Associated symptoms include shortness of breath. Pertinent negatives include no blurred vision. The current treatment provides moderate improvement.   ? ?Past Medical History:  ?Diagnosis Date  ? Arthritis   ? "shoulders" (05/04/2017)  ? Breast cancer, right breast ([place]) 1991  ? GERD (gastroesophageal reflux disease)   ? Hard of hearing   ? Hyperlipidemia   ? Hypertension   ? Memory loss   ? Non-stress test nonreactive, 04/24/12, normal 12/10/2012  ?  OSA on CPAP   ? "don't wear it all the time" (05/04/2017)  ? PVD (peripheral vascular disease) ([place])   ? Tobacco use 12/10/2012  ? Type II diabetes mellitus ([place])   ?  ? ?Family History  ?Problem Relation Age of Onset  ? Heart disease Mother   ? Stroke Mother   ? Stroke Father   ? Cancer Sister   ?     GIST  ? Cancer Brother   ?     thyroid   ? Diabetes Son   ? Stroke Son   ? ? ? ?Current Outpatient Medications:  ?  albuterol (PROVENTIL) (2.5 MG/3ML) 0.083% nebulizer solution, Take 3 mLs (2.5 mg total) by nebulization every 4 (four) hours as needed for wheezing or shortness of breath., Disp: 75 mL, Rfl: 2 ?  alendronate (FOSAMAX) 70 MG tablet, TAKE 1 TABLET EVERY WEDNESDAY WITH A FULL GLASS OF WATER ON AN EMPTY STOMACH, Disp: 12 tablet, Rfl: 3 ?  aspirin EC 81 MG tablet, Take 81 mg by mouth daily., Disp: , Rfl:  ?  betamethasone dipropionate (DIPROLENE) 0.05 % ointment, , Disp: , Rfl:  ?  Blood Glucose Monitoring Suppl (FREESTYLE FREEDOM LITE) w/Device KIT, Use to check blood sugar 3 times a day. Dx code e11.65, Disp: 1 kit, Rfl: 3 ?  clotrimazole-betamethasone (LOTRISONE) cream, Apply to affected area 2 times daily prn, Disp: 30 g, Rfl: 0 ?  ferrous sulfate 325 (65 FE) MG EC tablet, Take 1 tablet (325 mg total) by mouth 3 (three) times daily with meals. (Patient taking differently: Take 325 mg by mouth 3 (three) times  ?Rich Brave [place],acting as a Education administrator for Maximino Greenland, MD.,have documented all relevant documentation on the behalf of Maximino Greenland, MD,as directed by  Maximino Greenland, MD while in the presence of Maximino Greenland, MD.  ?This visit occurred during the SARS-CoV-2 public health emergency.  Safety protocols were in place, including screening questions prior to the visit, additional usage of staff PPE, and extensive cleaning of exam room while observing appropriate contact time as indicated for disinfecting solutions. ? ?Subjective:  ?  ? Patient ID: Leah Wiggins , female    DOB: 11-20-1926 , 88 y.o.   MRN: [id number] ? ? ?Chief Complaint  ?Patient presents with  ? Diabetes  ? Hypertension  ? ? ?HPI ? ?The patient is here today for a diabetes and blood pressure follow-up.  She is accompanied by her daughter today. She reports compliance with meds. . Patient also complains of SOB when shes walking to her bed.  ? ?Diabetes ?She presents for her follow-up diabetic visit. She has type 2 diabetes mellitus. There are no hypoglycemic associated symptoms. Pertinent negatives for diabetes include no blurred vision. There are no hypoglycemic complications. Risk factors for coronary artery disease include diabetes mellitus, dyslipidemia, hypertension, obesity, sedentary lifestyle and post-menopausal.  ?Hypertension ?This is a chronic problem. The current episode started more than 1 year ago. The problem has been gradually improving since onset. The problem is controlled. Associated symptoms include shortness of breath. Pertinent negatives include no blurred vision. The current treatment provides moderate improvement.   ? ?Past Medical History:  ?Diagnosis Date  ? Arthritis   ? "shoulders" (05/04/2017)  ? Breast cancer, right breast ([place]) 1991  ? GERD (gastroesophageal reflux disease)   ? Hard of hearing   ? Hyperlipidemia   ? Hypertension   ? Memory loss   ? Non-stress test nonreactive, 04/24/12, normal 12/10/2012  ?  OSA on CPAP   ? "don't wear it all the time" (05/04/2017)  ? PVD (peripheral vascular disease) ([place])   ? Tobacco use 12/10/2012  ? Type II diabetes mellitus ([place])   ?  ? ?Family History  ?Problem Relation Age of Onset  ? Heart disease Mother   ? Stroke Mother   ? Stroke Father   ? Cancer Sister   ?     GIST  ? Cancer Brother   ?     thyroid   ? Diabetes Son   ? Stroke Son   ? ? ? ?Current Outpatient Medications:  ?  albuterol (PROVENTIL) (2.5 MG/3ML) 0.083% nebulizer solution, Take 3 mLs (2.5 mg total) by nebulization every 4 (four) hours as needed for wheezing or shortness of breath., Disp: 75 mL, Rfl: 2 ?  alendronate (FOSAMAX) 70 MG tablet, TAKE 1 TABLET EVERY WEDNESDAY WITH A FULL GLASS OF WATER ON AN EMPTY STOMACH, Disp: 12 tablet, Rfl: 3 ?  aspirin EC 81 MG tablet, Take 81 mg by mouth daily., Disp: , Rfl:  ?  betamethasone dipropionate (DIPROLENE) 0.05 % ointment, , Disp: , Rfl:  ?  Blood Glucose Monitoring Suppl (FREESTYLE FREEDOM LITE) w/Device KIT, Use to check blood sugar 3 times a day. Dx code e11.65, Disp: 1 kit, Rfl: 3 ?  clotrimazole-betamethasone (LOTRISONE) cream, Apply to affected area 2 times daily prn, Disp: 30 g, Rfl: 0 ?  ferrous sulfate 325 (65 FE) MG EC tablet, Take 1 tablet (325 mg total) by mouth 3 (three) times daily with meals. (Patient taking differently: Take 325 mg by mouth 3 (three) times  ?Rich Brave [place],acting as a Education administrator for Maximino Greenland, MD.,have documented all relevant documentation on the behalf of Maximino Greenland, MD,as directed by  Maximino Greenland, MD while in the presence of Maximino Greenland, MD.  ?This visit occurred during the SARS-CoV-2 public health emergency.  Safety protocols were in place, including screening questions prior to the visit, additional usage of staff PPE, and extensive cleaning of exam room while observing appropriate contact time as indicated for disinfecting solutions. ? ?Subjective:  ?  ? Patient ID: Leah Wiggins , female    DOB: 11-20-1926 , 88 y.o.   MRN: [id number] ? ? ?Chief Complaint  ?Patient presents with  ? Diabetes  ? Hypertension  ? ? ?HPI ? ?The patient is here today for a diabetes and blood pressure follow-up.  She is accompanied by her daughter today. She reports compliance with meds. . Patient also complains of SOB when shes walking to her bed.  ? ?Diabetes ?She presents for her follow-up diabetic visit. She has type 2 diabetes mellitus. There are no hypoglycemic associated symptoms. Pertinent negatives for diabetes include no blurred vision. There are no hypoglycemic complications. Risk factors for coronary artery disease include diabetes mellitus, dyslipidemia, hypertension, obesity, sedentary lifestyle and post-menopausal.  ?Hypertension ?This is a chronic problem. The current episode started more than 1 year ago. The problem has been gradually improving since onset. The problem is controlled. Associated symptoms include shortness of breath. Pertinent negatives include no blurred vision. The current treatment provides moderate improvement.   ? ?Past Medical History:  ?Diagnosis Date  ? Arthritis   ? "shoulders" (05/04/2017)  ? Breast cancer, right breast ([place]) 1991  ? GERD (gastroesophageal reflux disease)   ? Hard of hearing   ? Hyperlipidemia   ? Hypertension   ? Memory loss   ? Non-stress test nonreactive, 04/24/12, normal 12/10/2012  ?  OSA on CPAP   ? "don't wear it all the time" (05/04/2017)  ? PVD (peripheral vascular disease) ([place])   ? Tobacco use 12/10/2012  ? Type II diabetes mellitus ([place])   ?  ? ?Family History  ?Problem Relation Age of Onset  ? Heart disease Mother   ? Stroke Mother   ? Stroke Father   ? Cancer Sister   ?     GIST  ? Cancer Brother   ?     thyroid   ? Diabetes Son   ? Stroke Son   ? ? ? ?Current Outpatient Medications:  ?  albuterol (PROVENTIL) (2.5 MG/3ML) 0.083% nebulizer solution, Take 3 mLs (2.5 mg total) by nebulization every 4 (four) hours as needed for wheezing or shortness of breath., Disp: 75 mL, Rfl: 2 ?  alendronate (FOSAMAX) 70 MG tablet, TAKE 1 TABLET EVERY WEDNESDAY WITH A FULL GLASS OF WATER ON AN EMPTY STOMACH, Disp: 12 tablet, Rfl: 3 ?  aspirin EC 81 MG tablet, Take 81 mg by mouth daily., Disp: , Rfl:  ?  betamethasone dipropionate (DIPROLENE) 0.05 % ointment, , Disp: , Rfl:  ?  Blood Glucose Monitoring Suppl (FREESTYLE FREEDOM LITE) w/Device KIT, Use to check blood sugar 3 times a day. Dx code e11.65, Disp: 1 kit, Rfl: 3 ?  clotrimazole-betamethasone (LOTRISONE) cream, Apply to affected area 2 times daily prn, Disp: 30 g, Rfl: 0 ?  ferrous sulfate 325 (65 FE) MG EC tablet, Take 1 tablet (325 mg total) by mouth 3 (three) times daily with meals. (Patient taking differently: Take 325 mg by mouth 3 (three) times

## 2021-05-26 LAB — CBC
Hematocrit: 36.7 % (ref 34.0–46.6)
Hemoglobin: 11.6 g/dL (ref 11.1–15.9)
MCH: 25 pg — ABNORMAL LOW (ref 26.6–33.0)
MCHC: 31.6 g/dL (ref 31.5–35.7)
MCV: 79 fL (ref 79–97)
RBC: 4.64 x10E6/uL (ref 3.77–5.28)
RDW: 16.7 % — ABNORMAL HIGH (ref 11.7–15.4)
WBC: 5.4 10*3/uL (ref 3.4–10.8)

## 2021-05-26 LAB — CMP14+EGFR
ALT: 15 IU/L (ref 0–32)
AST: 20 IU/L (ref 0–40)
Albumin/Globulin Ratio: 1.8 (ref 1.2–2.2)
Albumin: 4.1 g/dL (ref 3.5–4.6)
Alkaline Phosphatase: 87 IU/L (ref 44–121)
BUN/Creatinine Ratio: 16 (ref 12–28)
BUN: 19 mg/dL (ref 10–36)
Bilirubin Total: 0.2 mg/dL (ref 0.0–1.2)
CO2: 26 mmol/L (ref 20–29)
Calcium: 9.3 mg/dL (ref 8.7–10.3)
Chloride: 110 mmol/L — ABNORMAL HIGH (ref 96–106)
Creatinine, Ser: 1.2 mg/dL — ABNORMAL HIGH (ref 0.57–1.00)
Globulin, Total: 2.3 g/dL (ref 1.5–4.5)
Glucose: 103 mg/dL — ABNORMAL HIGH (ref 70–99)
Potassium: 4 mmol/L (ref 3.5–5.2)
Sodium: 149 mmol/L — ABNORMAL HIGH (ref 134–144)
Total Protein: 6.4 g/dL (ref 6.0–8.5)
eGFR: 42 mL/min/{1.73_m2} — ABNORMAL LOW (ref >59)

## 2021-05-26 LAB — HEMOGLOBIN A1C
Est. average glucose Bld gHb Est-mCnc: 157 mg/dL
Hgb A1c MFr Bld: 7.1 % — ABNORMAL HIGH (ref 4.8–5.6)

## 2021-05-26 LAB — IRON,TIBC AND FERRITIN PANEL
Ferritin: 39 ng/mL (ref 15–150)
Iron Saturation: 29 % (ref 15–55)
Iron: 82 ug/dL (ref 27–139)
Total Iron Binding Capacity: 284 ug/dL (ref 250–450)
UIBC: 202 ug/dL (ref 118–369)

## 2021-05-26 LAB — TSH: TSH: 1.8 u[IU]/mL (ref 0.450–4.500)

## 2021-05-26 LAB — VITAMIN B12: Vitamin B-12: 1152 pg/mL (ref 232–1245)

## 2021-05-27 ENCOUNTER — Ambulatory Visit: Payer: Medicare Other | Admitting: Internal Medicine

## 2021-06-08 ENCOUNTER — Encounter (HOSPITAL_COMMUNITY): Payer: Self-pay

## 2021-06-08 ENCOUNTER — Ambulatory Visit (HOSPITAL_COMMUNITY)
Admission: EM | Admit: 2021-06-08 | Discharge: 2021-06-08 | Disposition: A | Discharge status: Home / Self Care | Payer: Medicare Other | Attending: Student | Admitting: Student

## 2021-06-08 ENCOUNTER — Telehealth: Payer: Self-pay

## 2021-06-08 ENCOUNTER — Emergency Department (HOSPITAL_COMMUNITY)
Admission: EM | Admit: 2021-06-08 | Discharge: 2021-06-09 | Disposition: A | Discharge status: Home / Self Care | Payer: Medicare Other | Attending: Student | Admitting: Student

## 2021-06-08 ENCOUNTER — Emergency Department (HOSPITAL_COMMUNITY): Payer: Medicare Other

## 2021-06-08 DIAGNOSIS — Z7984 Long term (current) use of oral hypoglycemic drugs: Secondary | ICD-10-CM | POA: Diagnosis not present

## 2021-06-08 DIAGNOSIS — Z87891 Personal history of nicotine dependence: Secondary | ICD-10-CM | POA: Diagnosis not present

## 2021-06-08 DIAGNOSIS — R079 Chest pain, unspecified: Secondary | ICD-10-CM

## 2021-06-08 DIAGNOSIS — R0602 Shortness of breath: Secondary | ICD-10-CM | POA: Insufficient documentation

## 2021-06-08 DIAGNOSIS — Z79899 Other long term (current) drug therapy: Secondary | ICD-10-CM | POA: Diagnosis not present

## 2021-06-08 DIAGNOSIS — E1122 Type 2 diabetes mellitus with diabetic chronic kidney disease: Secondary | ICD-10-CM | POA: Insufficient documentation

## 2021-06-08 DIAGNOSIS — Z853 Personal history of malignant neoplasm of breast: Secondary | ICD-10-CM | POA: Diagnosis not present

## 2021-06-08 DIAGNOSIS — Z7982 Long term (current) use of aspirin: Secondary | ICD-10-CM | POA: Insufficient documentation

## 2021-06-08 DIAGNOSIS — N182 Chronic kidney disease, stage 2 (mild): Secondary | ICD-10-CM | POA: Insufficient documentation

## 2021-06-08 DIAGNOSIS — I129 Hypertensive chronic kidney disease with stage 1 through stage 4 chronic kidney disease, or unspecified chronic kidney disease: Secondary | ICD-10-CM | POA: Diagnosis not present

## 2021-06-08 LAB — BASIC METABOLIC PANEL
Anion gap: 8 (ref 5–15)
BUN: 12 mg/dL (ref 8–23)
CO2: 29 mmol/L (ref 22–32)
Calcium: 9 mg/dL (ref 8.9–10.3)
Chloride: 105 mmol/L (ref 98–111)
Creatinine, Ser: 1.06 mg/dL — ABNORMAL HIGH (ref 0.44–1.00)
GFR, Estimated: 49 mL/min — ABNORMAL LOW (ref >60)
Glucose, Bld: 149 mg/dL — ABNORMAL HIGH (ref 70–99)
Potassium: 4.3 mmol/L (ref 3.5–5.1)
Sodium: 142 mmol/L (ref 135–145)

## 2021-06-08 LAB — CBC
HCT: 36.1 % (ref 36.0–46.0)
Hemoglobin: 11.3 g/dL — ABNORMAL LOW (ref 12.0–15.0)
MCH: 24.8 pg — ABNORMAL LOW (ref 26.0–34.0)
MCHC: 31.3 g/dL (ref 30.0–36.0)
MCV: 79.2 fL — ABNORMAL LOW (ref 80.0–100.0)
Platelets: UNDETERMINED 10*3/uL (ref 150–400)
RBC: 4.56 MIL/uL (ref 3.87–5.11)
RDW: 17.6 % — ABNORMAL HIGH (ref 11.5–15.5)
WBC: 5.9 10*3/uL (ref 4.0–10.5)
nRBC: 0 % (ref 0.0–0.2)

## 2021-06-08 LAB — TROPONIN I (HIGH SENSITIVITY)
Troponin I (High Sensitivity): 8 ng/L (ref <18)
Troponin I (High Sensitivity): 7 ng/L (ref <18)

## 2021-06-08 NOTE — ED Provider Notes (Signed)
?[facility] ?Provider Note ? ?CSN: [id number] ?Arrival date & time: 06/08/21 1714 ? ?Chief Complaint(s) ?Chest Pain ? ?HPI ?Leah Wiggins is a 88 y.o. female with PMH right breast cancer, HLD, HTN, OSA, T2DM who presents emergency department for evaluation of chest pain.  Patient states that over the last 1 month she has had intermittent left-sided chest pain with associated shortness of breath.  She states the symptoms have worsened in the last 48 hours.  She also endorses an exertional component to her shortness of breath and she is unsure why this is happening.  Denies diaphoresis, nausea, vomiting.  Denies abdominal pain, diarrhea, cough, headache, fever or other systemic symptoms.  Patient went to urgent care who transferred the patient to the emergency department for further evaluation of her chest pain. ? ? ?Chest Pain ?Associated symptoms: shortness of breath   ? ?Past Medical History ?Past Medical History:  ?Diagnosis Date  ? Arthritis   ? "shoulders" (05/04/2017)  ? Breast cancer, right breast ([place]) 1991  ? GERD (gastroesophageal reflux disease)   ? Hard of hearing   ? Hyperlipidemia   ? Hypertension   ? Memory loss   ? Non-stress test nonreactive, 04/24/12, normal 12/10/2012  ? OSA on CPAP   ? "don't wear it all the time" (05/04/2017)  ? PVD (peripheral vascular disease) ([place])   ? Tobacco use 12/10/2012  ? Type II diabetes mellitus ([place])   ? ?Patient Active Problem List  ? Diagnosis Date Noted  ? Hypertensive nephropathy 09/03/2020  ? Type 2 diabetes mellitus with stage 2 chronic kidney disease, without long-term current use of insulin ([place]) 09/03/2020  ? Atherosclerosis of aorta ([place]) 09/03/2020  ? Absolute anemia 09/03/2020  ? Cellulitis of left foot 09/03/2020  ? Class 1 obesity due to excess calories with serious comorbidity and body mass index (BMI) of 33.0 to 33.9 in adult 09/03/2020  ? Abnormal feces 06/04/2020  ? Abnormal weight loss 06/04/2020  ?  Angiodysplasia of intestine 06/04/2020  ? Colon cancer screening 06/04/2020  ? Gastroesophageal reflux disease 06/04/2020  ? 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 Prolapsed internal hemorrhoids 06/04/2020  ? Rectal bleeding 06/04/2020  ? Sensorineural hearing loss (SNHL) of right ear with restricted hearing of left ear 06/04/2020  ? Mild cognitive impairment 12/23/2019  ? Memory loss 06/24/2019  ? Intrinsic atopic dermatitis 09/18/2018  ? Chronic obstructive pulmonary disease ([place]) 04/06/2018  ? Prediabetes 02/07/2018  ? Iron deficiency anemia due to chronic blood loss 12/25/2017  ? Anaphylaxis 05/04/2017  ? Rash and nonspecific skin eruption 05/04/2017  ? History of breast cancer 08/14/2014  ? Empty sella ([place]) 07/14/2014  ? Pain in the chest 04/28/2014  ? Diabetes mellitus without complication ([place]) 42/35/3614  ? Chest pain 03/03/2014  ? Essential hypertension 06/24/2013  ? PAD (peripheral artery disease), abnormal ABIs 12/10/2012  ? Non-stress test nonreactive, 04/24/12, normal 12/10/2012  ? Dyslipidemia 12/10/2012  ? Tobacco use 12/10/2012  ? Thrombocytopenia ([place]) 01/24/2012  ? RESTLESS LEG SYNDROME 02/16/2010  ? METHICILLIN SUSECPTIBLE PNEUMONIA STAPH AUREUS 01/13/2010  ? HIP THI LEG&ANK ABRASION/FRICION BURN W/O INF 01/13/2010  ? ?Home Medication(s) ?Prior to Admission medications   ?Medication Sig Start Date End Date Taking? Authorizing Provider  ?albuterol (PROVENTIL) (2.5 MG/3ML) 0.083% nebulizer solution Take 3 mLs (2.5 mg total) by nebulization every 4 (four) hours as needed for wheezing or shortness of breath. 08/06/20 08/06/21  Glendale Chard, MD  ?  alendronate (FOSAMAX) 70 MG tablet TAKE 1 TABLET EVERY WEDNESDAY WITH A FULL GLASS OF WATER ON AN EMPTY STOMACH 03/30/21   Glendale Chard, MD  ?aspirin EC 81 MG tablet Take 81 mg by mouth daily.    [provider]  ?betamethasone dipropionate (DIPROLENE) 0.05 % ointment  04/19/19    [provider]  ?Blood Glucose Monitoring Suppl (FREESTYLE FREEDOM LITE) w/Device KIT Use to check blood sugar 3 times a day. ?Dx code e11.65 09/18/19   Glendale Chard, MD  ?clotrimazole-betamethasone Donalynn Furlong) cream Apply to affected area 2 times daily prn 10/31/15   Janne Napoleon, NP  ?ferrous sulfate 325 (65 FE) MG EC tablet Take 1 tablet (325 mg total) by mouth 3 (three) times daily with meals. ?Patient taking differently: Take 325 mg by mouth 3 (three) times daily with meals. 1 daily 12/25/17 05/25/21  Henreitta Leber, MD  ?Fluocinolone Acetonide 0.01 % OIL  07/21/20   [provider]  ?gabapentin (NEURONTIN) 100 MG capsule TAKE 1 CAPSULE NIGHTLY 12/28/20   Glendale Chard, MD  ?glucose blood (FREESTYLE LITE) test strip Use to check blood sugar 3 times a day. Dx code e11.65 01/10/20   Glendale Chard, MD  ?hydrocortisone 2.5 % cream  09/04/19   [provider]  ?memantine (NAMENDA) 10 MG tablet Take 1 tablet (10 mg total) by mouth 2 (two) times daily. 12/23/19   Marcial Pacas, MD  ?metFORMIN (GLUCOPHAGE-XR) 500 MG 24 hr tablet TAKE 1 TABLET TWICE A DAY 04/23/21   Glendale Chard, MD  ?MICARDIS 40 MG tablet TAKE 1 TABLET DAILY 02/05/21   Glendale Chard, MD  ?mometasone (ELOCON) 0.1 % lotion Apply 1 application topically daily as needed (IRRITATION).  04/15/14   [provider]  ?mupirocin ointment (BACTROBAN) 2 % 1 application as needed.  04/11/17   [provider]  ?pantoprazole (PROTONIX) 40 MG tablet TAKE 1 TABLET DAILY AT 12 NOON 04/27/20   Glendale Chard, MD  ?pravastatin (PRAVACHOL) 40 MG tablet TAKE 1 TABLET DAILY 05/03/21   Glendale Chard, MD  ?Spacer/Aero-Holding Chambers (AEROCHAMBER PLUS) inhaler Use as instructed 08/19/19   Glendale Chard, MD  ?Tiotropium Bromide Monohydrate (SPIRIVA RESPIMAT) 2.5 MCG/ACT AERS Inhale 2 puffs into the lungs daily. 02/06/21   Glendale Chard, MD  ?triamcinolone (KENALOG) 0.1 %  02/11/19   [provider]  ?                                                                                                                                   ?Past Surgical History ?Past Surgical History:  ?Procedure Laterality Date  ? APPENDECTOMY    ? BIOPSY  01/19/2018  ? Procedure: BIOPSY;  Surgeon: Carol Ada, MD;  Location: [facility];  Service: Endoscopy;;  ? BREAST BIOPSY Right 1991  ? CARDIOVASCULAR STRESS TEST  04/24/2012  ? No significant ST segment change suggestive of ischemia.  ? CATARACT EXTRACTION, BILATERAL Bilateral   ? COLONOSCOPY WITH  PROPOFOL N/A 01/19/2018  ? Procedure: COLONOSCOPY WITH PROPOFOL;  Surgeon: Carol Ada, MD;  Location: [facility];  Service: Endoscopy;  Laterality: N/A;  ? ESOPHAGOGASTRODUODENOSCOPY (EGD) WITH PROPOFOL N/A 01/19/2018  ? Procedure: ESOPHAGOGASTRODUODENOSCOPY (EGD) WITH PROPOFOL;  Surgeon: Carol Ada, MD;  Location: [facility];  Service: Endoscopy;  Laterality: N/A;  ? JOINT REPLACEMENT    ? LOWER EXTREMITY ARTERIAL DOPPLER Bilateral 05/07/2012  ? Left ABI-demonstrated mild arterial insufficiency. Right CIA 50-69% diameter reduction. Right SFA less than 50% diameter reduction. Left SFA 70-99% diameter reduction. Bilateral Runoff-Posterior tibial arteries appeared occluded.  ? MASTECTOMY PARTIAL / LUMPECTOMY W/ AXILLARY LYMPHADENECTOMY Right 1991   ? TOTAL HIP ARTHROPLASTY Right 2011  ? ?Family History ?Family History  ?Problem Relation Age of Onset  ? Heart disease Mother   ? Stroke Mother   ? Stroke Father   ? Cancer Sister   ?     GIST  ? Cancer Brother   ?     thyroid   ? Diabetes Son   ? Stroke Son   ? ? ?Social History ?Social History  ? ?Tobacco Use  ? Smoking status: Former  ?  Packs/day: 0.25  ?  Years: 70.00  ?  Pack years: 17.50  ?  Types: Cigarettes  ?  Start date: 03/08/1947  ? Smokeless tobacco: Never  ? Tobacco comments:  ?  has tried cold [place]  ?Vaping Use  ? Vaping Use: Never used  ?Substance Use Topics  ? Alcohol use: No  ?  Alcohol/week: 0.0 standard drinks  ? Drug use: No  ? ?Allergies ?Bactrim  [sulfamethoxazole-trimethoprim] and Penicillins ? ?Review of Systems ?Review of Systems  ?Respiratory:  Positive for shortness of breath.   ?Cardiovascular:  Positive for chest pain.  ? ?Physical Exam ?Vital Signs

## 2021-06-08 NOTE — ED Triage Notes (Signed)
Pt c/o right sided chest pain at bra line and SOB, pt states happens "when I go to get in bed and wake up"x1 mo. Pt is eupneic. VSS. Pt does not appear to be in any distress.  ?

## 2021-06-08 NOTE — Discharge Instructions (Addendum)
Sent to ED via POV ?

## 2021-06-08 NOTE — ED Provider Notes (Signed)
[facility]    CSN: [id number] Arrival date & time: 06/08/21  1619      History   Chief Complaint No chief complaint on file.   HPI Leah Wiggins is a 88 y.o. female presenting with chest pain for 1 day.  History diabetes, hypertension, hyperlipidemia, hearing loss.  Here today with daughter who helps provide history.  Describes central chest pain that is worse on the left side, associated with increase in baseline dyspnea.  She is unable to describe how the pain feels, or if it is worse with exertion and movement.  Denies dizziness.  Was told by her primary care provider to go to the emergency department and so presented to this urgent care.  HPI  Past Medical History:  Diagnosis Date   Arthritis    "shoulders" (05/04/2017)   Breast cancer, right breast ([facility]) 1991   GERD (gastroesophageal reflux disease)    Hard of hearing    Hyperlipidemia    Hypertension    Memory loss    Non-stress test nonreactive, 04/24/12, normal 12/10/2012   OSA on CPAP    "don't wear it all the time" (05/04/2017)   PVD (peripheral vascular disease) ([facility])    Tobacco use 12/10/2012   Type II diabetes mellitus ([facility])     Patient Active Problem List   Diagnosis Date Noted   Hypertensive nephropathy 09/03/2020   Type 2 diabetes mellitus with stage 2 chronic kidney disease, without long-term current use of insulin ([facility]) 09/03/2020   Atherosclerosis of aorta ([facility]) 09/03/2020   Absolute anemia 09/03/2020   Cellulitis of left foot 09/03/2020   Class 1 obesity due to excess calories with serious comorbidity and body mass index (BMI) of 33.0 to 33.9 in adult 09/03/2020   Abnormal feces 06/04/2020   Abnormal weight loss 06/04/2020   Angiodysplasia of intestine 06/04/2020   Colon cancer screening 06/04/2020   Gastroesophageal reflux disease 06/04/2020   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 hearing of right ear 06/04/2020   Prolapsed internal hemorrhoids  06/04/2020   Rectal bleeding 06/04/2020   Sensorineural hearing loss (SNHL) of right ear with restricted hearing of left ear 06/04/2020   Mild cognitive impairment 12/23/2019   Memory loss 06/24/2019   Intrinsic atopic dermatitis 09/18/2018   Chronic obstructive pulmonary disease ([facility]) 04/06/2018   Prediabetes 02/07/2018   Iron deficiency anemia due to chronic blood loss 12/25/2017   Anaphylaxis 05/04/2017   Rash and nonspecific skin eruption 05/04/2017   History of breast cancer 08/14/2014   Empty sella ([facility]) 07/14/2014   Pain in the chest 04/28/2014   Diabetes mellitus without complication ([facility]) 03/03/2014   Chest pain 03/03/2014   Essential hypertension 06/24/2013   PAD (peripheral artery disease), abnormal ABIs 12/10/2012   Non-stress test nonreactive, 04/24/12, normal 12/10/2012   Dyslipidemia 12/10/2012   Tobacco use 12/10/2012   Thrombocytopenia ([facility]) 01/24/2012   RESTLESS LEG SYNDROME 02/16/2010   METHICILLIN SUSECPTIBLE PNEUMONIA STAPH AUREUS 01/13/2010   HIP THI LEG&ANK ABRASION/FRICION BURN W/O INF 01/13/2010    Past Surgical History:  Procedure Laterality Date   APPENDECTOMY     BIOPSY  01/19/2018   Procedure: BIOPSY;  Surgeon: Jeani Hawking, MD;  Location: [facility];  Service: Endoscopy;;   BREAST BIOPSY Right 1991   CARDIOVASCULAR STRESS TEST  04/24/2012   No significant ST segment change suggestive of ischemia.   CATARACT EXTRACTION, BILATERAL Bilateral    COLONOSCOPY WITH PROPOFOL N/A 01/19/2018   Procedure: COLONOSCOPY WITH PROPOFOL;  Surgeon: Jeani Hawking, MD;  Location: [facility];  Service: Endoscopy;  Laterality: N/A;   ESOPHAGOGASTRODUODENOSCOPY (EGD) WITH PROPOFOL N/A 01/19/2018   Procedure: ESOPHAGOGASTRODUODENOSCOPY (EGD) WITH PROPOFOL;  Surgeon: Jeani Hawking, MD;  Location: [facility];  Service: Endoscopy;  Laterality: N/A;   JOINT REPLACEMENT     LOWER EXTREMITY ARTERIAL DOPPLER Bilateral 05/07/2012   Left ABI-demonstrated mild arterial  insufficiency. Right CIA 50-69% diameter reduction. Right SFA less than 50% diameter reduction. Left SFA 70-99% diameter reduction. Bilateral Runoff-Posterior tibial arteries appeared occluded.   MASTECTOMY PARTIAL / LUMPECTOMY W/ AXILLARY LYMPHADENECTOMY Right 1991    TOTAL HIP ARTHROPLASTY Right 2011    OB History   No obstetric history on file.      Home Medications    Prior to Admission medications   Medication Sig Start Date End Date Taking? Authorizing Provider  albuterol (PROVENTIL) (2.5 MG/3ML) 0.083% nebulizer solution Take 3 mLs (2.5 mg total) by nebulization every 4 (four) hours as needed for wheezing or shortness of breath. 08/06/20 08/06/21  Dorothyann Peng, MD  alendronate (FOSAMAX) 70 MG tablet TAKE 1 TABLET EVERY WEDNESDAY WITH A FULL GLASS OF WATER ON AN EMPTY STOMACH 03/30/21   Dorothyann Peng, MD  aspirin EC 81 MG tablet Take 81 mg by mouth daily.    [provider]  betamethasone dipropionate (DIPROLENE) 0.05 % ointment  04/19/19   [provider]  Blood Glucose Monitoring Suppl (FREESTYLE FREEDOM LITE) w/Device KIT Use to check blood sugar 3 times a day. Dx code e11.65 09/18/19   Dorothyann Peng, MD  clotrimazole-betamethasone Thurmond Butts) cream Apply to affected area 2 times daily prn 10/31/15   Hayden Rasmussen, NP  ferrous sulfate 325 (65 FE) MG EC tablet Take 1 tablet (325 mg total) by mouth 3 (three) times daily with meals. Patient taking differently: Take 325 mg by mouth 3 (three) times daily with meals. 1 daily 12/25/17 05/25/21  Toni Arthurs, MD  Fluocinolone Acetonide 0.01 % OIL  07/21/20   [provider]  gabapentin (NEURONTIN) 100 MG capsule TAKE 1 CAPSULE NIGHTLY 12/28/20   Dorothyann Peng, MD  glucose blood (FREESTYLE LITE) test strip Use to check blood sugar 3 times a day. Dx code e11.65 01/10/20   Dorothyann Peng, MD  hydrocortisone 2.5 % cream  09/04/19   [provider]  memantine (NAMENDA) 10 MG tablet Take 1 tablet (10 mg total) by  mouth 2 (two) times daily. 12/23/19   Levert Feinstein, MD  metFORMIN (GLUCOPHAGE-XR) 500 MG 24 hr tablet TAKE 1 TABLET TWICE A DAY 04/23/21   Dorothyann Peng, MD  MICARDIS 40 MG tablet TAKE 1 TABLET DAILY 02/05/21   Dorothyann Peng, MD  mometasone (ELOCON) 0.1 % lotion Apply 1 application topically daily as needed (IRRITATION).  04/15/14   [provider]  mupirocin ointment (BACTROBAN) 2 % 1 application as needed.  04/11/17   [provider]  pantoprazole (PROTONIX) 40 MG tablet TAKE 1 TABLET DAILY AT 12 NOON 04/27/20   Dorothyann Peng, MD  pravastatin (PRAVACHOL) 40 MG tablet TAKE 1 TABLET DAILY 05/03/21   Dorothyann Peng, MD  Spacer/Aero-Holding Chambers (AEROCHAMBER PLUS) inhaler Use as instructed 08/19/19   Dorothyann Peng, MD  Tiotropium Bromide Monohydrate (SPIRIVA RESPIMAT) 2.5 MCG/ACT AERS Inhale 2 puffs into the lungs daily. 02/06/21   Dorothyann Peng, MD  triamcinolone (KENALOG) 0.1 %  02/11/19   [provider]    Family History Family History  Problem Relation Age of Onset   Heart disease Mother    Stroke  Mother    Stroke Father    Cancer Sister        GIST   Cancer Brother        thyroid    Diabetes Son    Stroke Son     Social History Social History   Tobacco Use   Smoking status: Former    Packs/day: 0.25    Years: 70.00    Pack years: 17.50    Types: Cigarettes    Start date: 03/08/1947   Smokeless tobacco: Never   Tobacco comments:    has tried cold [place]  Vaping Use   Vaping Use: Never used  Substance Use Topics   Alcohol use: No    Alcohol/week: 0.0 standard drinks   Drug use: No     Allergies   Bactrim [sulfamethoxazole-trimethoprim] and Penicillins   Review of Systems Review of Systems  Cardiovascular:  Positive for chest pain.  All other systems reviewed and are negative.   Physical Exam Triage Vital Signs ED Triage Vitals [06/08/21 1651]  Enc Vitals Group     BP 132/85     Pulse Rate 81     Resp 18     Temp 98.3 F (36.8 C)      Temp Source Oral     SpO2 100 %     Weight      Height      Head Circumference      Peak Flow      Pain Score      Pain Loc      Pain Edu?      Excl. in GC?    No data found.  Updated Vital Signs BP 132/85 (BP Location: Left Arm)   Pulse 81   Temp 98.3 F (36.8 C) (Oral)   Resp 18   SpO2 100%   Visual Acuity Right Eye Distance:   Left Eye Distance:   Bilateral Distance:    Right Eye Near:   Left Eye Near:    Bilateral Near:     Physical Exam Vitals reviewed.  Constitutional:      Appearance: Normal appearance. She is not diaphoretic.  HENT:     Head: Normocephalic and atraumatic.     Mouth/Throat:     Mouth: Mucous membranes are moist.  Eyes:     Extraocular Movements: Extraocular movements intact.     Pupils: Pupils are equal, round, and reactive to light.  Cardiovascular:     Rate and Rhythm: Normal rate and regular rhythm.     Pulses:          Radial pulses are 2+ on the right side and 2+ on the left side.     Heart sounds: Normal heart sounds.  Pulmonary:     Effort: Pulmonary effort is normal.     Breath sounds: Normal breath sounds.  Chest:     Chest wall: No tenderness.  Abdominal:     Palpations: Abdomen is soft.     Tenderness: There is no abdominal tenderness. There is no guarding or rebound.  Musculoskeletal:     Right lower leg: No edema.     Left lower leg: No edema.  Skin:    General: Skin is warm.     Capillary Refill: Capillary refill takes less than 2 seconds.  Neurological:     General: No focal deficit present.     Mental Status: She is alert and oriented to person, place, and time.  Psychiatric:  Mood and Affect: Mood normal.        Behavior: Behavior normal.        Thought Content: Thought content normal.        Judgment: Judgment normal.     UC Treatments / Results  Labs (all labs ordered are listed, but only abnormal results are displayed) Labs Reviewed - No data to display  EKG   Radiology No results  found.  Procedures Procedures (including critical care time)  Medications Ordered in UC Medications - No data to display  Initial Impression / Assessment and Plan / UC Course  I have reviewed the triage vital signs and the nursing notes.  Pertinent labs & imaging results that were available during my care of the patient were reviewed by me and considered in my medical decision making (see chart for details).     This patient is a very pleasant 88 y.o. year old female presenting with chest pain. Afebrile, nontachy. Pain is not reproducible. EKG NSR, compared with 2022 EKG. Sent to ED via POV. Daughter is in agreement.   Final Clinical Impressions(s) / UC Diagnoses   Final diagnoses:  Left-sided chest pain     Discharge Instructions      Sent to ED via POV   ED Prescriptions   None    PDMP not reviewed this encounter.   Rhys Martini, PA-C 06/08/21 [phone]

## 2021-06-08 NOTE — ED Notes (Signed)
Patient is being discharged from the Urgent Care and sent to the Emergency Department via POV . Per Mickel Baas PA, patient is in need of higher level of care due to chest/breast pain. Patient is aware and verbalizes understanding of plan of care.  ?Vitals:  ? 06/08/21 1651  ?BP: 132/85  ?Pulse: 81  ?Resp: 18  ?Temp: 98.3 ?F (36.8 ?C)  ?SpO2: 100%  ?  ?

## 2021-06-08 NOTE — Telephone Encounter (Signed)
The pt was notified to go to the ER for her chest pain that she is having.   ?

## 2021-06-08 NOTE — ED Triage Notes (Signed)
Pt presents to the office for chest pain. She was advised to go to ER, however she came here first. Per Mickel Baas - she should go to ER for cardiac work-up.  ?

## 2021-06-09 ENCOUNTER — Emergency Department (HOSPITAL_COMMUNITY): Payer: Medicare Other

## 2021-06-09 ENCOUNTER — Telehealth: Payer: Self-pay

## 2021-06-09 DIAGNOSIS — R079 Chest pain, unspecified: Secondary | ICD-10-CM | POA: Diagnosis not present

## 2021-06-09 MED ORDER — IOHEXOL 350 MG/ML SOLN
64.0000 mL | Freq: Once | INTRAVENOUS | Status: AC | PRN
  Administered 2021-06-09: 64 mL via INTRAVENOUS

## 2021-06-09 NOTE — ED Provider Notes (Signed)
Patient signed out to me by Dr. Christella Noa to follow-up on CT angio of chest.  Patient seen with progressive symptoms of exertional dyspnea over 1 month.  Work-up has been unrevealing.  This includes EKG, troponin x2, CT angiography. ? ?Discussed admission with the patient.  She does not wish to be admitted at this time.  She is currently not having shortness of breath or chest pain.  She ambulated in the hallway of the department without any symptoms.  This seems reasonable to work-up as an outpatient.  Patient does have a private cardiologist, will contact today for prompt follow-up.  Return for worsening symptoms. ?  ?Orpah Greek, MD ?06/09/21 0532 ? ?

## 2021-06-09 NOTE — ED Notes (Signed)
Ct called to ensure they knew the pt was ready for CT ?

## 2021-06-09 NOTE — ED Notes (Signed)
Patient transported to CT 

## 2021-06-09 NOTE — ED Notes (Signed)
PT ambulatory with no noted difficulties ?

## 2021-06-09 NOTE — Telephone Encounter (Signed)
Transition Care Management Unsuccessful Follow-up Telephone Call ? ?Date of discharge and from where:  06/09/21 [facility] ? ?Attempts:  1st Attempt ? ?Reason for unsuccessful TCM follow-up call:  Left voice message ? ?  ?

## 2021-06-14 ENCOUNTER — Telehealth: Payer: Self-pay | Admitting: Cardiovascular Disease

## 2021-06-14 NOTE — Telephone Encounter (Signed)
06/14/21 2:50pm LVM to schedule hospital f/u with Dr. Gwenlyn Found or an APP - [facility] ?

## 2021-06-17 ENCOUNTER — Encounter: Payer: Self-pay | Admitting: Sports Medicine

## 2021-06-17 ENCOUNTER — Ambulatory Visit (INDEPENDENT_AMBULATORY_CARE_PROVIDER_SITE_OTHER): Payer: Medicare Other | Admitting: Sports Medicine

## 2021-06-17 DIAGNOSIS — M79609 Pain in unspecified limb: Secondary | ICD-10-CM

## 2021-06-17 DIAGNOSIS — B351 Tinea unguium: Secondary | ICD-10-CM

## 2021-06-17 DIAGNOSIS — N182 Chronic kidney disease, stage 2 (mild): Secondary | ICD-10-CM

## 2021-06-17 DIAGNOSIS — E1122 Type 2 diabetes mellitus with diabetic chronic kidney disease: Secondary | ICD-10-CM

## 2021-06-17 DIAGNOSIS — I739 Peripheral vascular disease, unspecified: Secondary | ICD-10-CM

## 2021-06-17 NOTE — Progress Notes (Signed)
Subjective: ?Leah Wiggins is a 88 y.o. female patient with history of diabetes who presents to office today complaining of long,mildly painful nails  while ambulating in shoes; unable to trim. FBS not recorded and last office visit PCP Dr. Baird Cancer was 3/23.  Patient is assisted by daughter this visit. ? ?Patient Active Problem List  ? Diagnosis Date Noted  ? Hypertensive nephropathy 09/03/2020  ? Type 2 diabetes mellitus with stage 2 chronic kidney disease, without long-term current use of insulin ([place]) 09/03/2020  ? Atherosclerosis of aorta ([place]) 09/03/2020  ? Absolute anemia 09/03/2020  ? Cellulitis of left foot 09/03/2020  ? Class 1 obesity due to excess calories with serious comorbidity and body mass index (BMI) of 33.0 to 33.9 in adult 09/03/2020  ? Abnormal feces 06/04/2020  ? Abnormal weight loss 06/04/2020  ? Angiodysplasia of intestine 06/04/2020  ? Colon cancer screening 06/04/2020  ? Gastroesophageal reflux disease 06/04/2020  ? 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 Prolapsed internal hemorrhoids 06/04/2020  ? Rectal bleeding 06/04/2020  ? Sensorineural hearing loss (SNHL) of right ear with restricted hearing of left ear 06/04/2020  ? Mild cognitive impairment 12/23/2019  ? Memory loss 06/24/2019  ? Intrinsic atopic dermatitis 09/18/2018  ? Chronic obstructive pulmonary disease ([place]) 04/06/2018  ? Prediabetes 02/07/2018  ? Iron deficiency anemia due to chronic blood loss 12/25/2017  ? Anaphylaxis 05/04/2017  ? Rash and nonspecific skin eruption 05/04/2017  ? History of breast cancer 08/14/2014  ? Empty sella ([place]) 07/14/2014  ? Pain in the chest 04/28/2014  ? Diabetes mellitus without complication ([place]) 00/93/8182  ? Chest pain 03/03/2014  ? Essential hypertension 06/24/2013  ? PAD (peripheral artery disease), abnormal ABIs 12/10/2012  ? Non-stress test nonreactive, 04/24/12, normal 12/10/2012  ? Dyslipidemia 12/10/2012  ?  Tobacco use 12/10/2012  ? Thrombocytopenia ([place]) 01/24/2012  ? RESTLESS LEG SYNDROME 02/16/2010  ? METHICILLIN SUSECPTIBLE PNEUMONIA STAPH AUREUS 01/13/2010  ? HIP THI LEG&ANK ABRASION/FRICION BURN W/O INF 01/13/2010  ? ?Current Outpatient Medications on File Prior to Visit  ?Medication Sig Dispense Refill  ? albuterol (PROVENTIL) (2.5 MG/3ML) 0.083% nebulizer solution Take 3 mLs (2.5 mg total) by nebulization every 4 (four) hours as needed for wheezing or shortness of breath. 75 mL 2  ? alendronate (FOSAMAX) 70 MG tablet TAKE 1 TABLET EVERY WEDNESDAY WITH A FULL GLASS OF WATER ON AN EMPTY STOMACH (Patient taking differently: Take 70 mg by mouth every Wednesday.) 12 tablet 3  ? ARTIFICIAL TEAR OP Place 1 drop into both eyes 2 (two) times daily as needed (dry eyes).    ? aspirin EC 81 MG tablet Take 81 mg by mouth daily.    ? betamethasone dipropionate (DIPROLENE) 0.05 % ointment Apply 1 application. topically 2 (two) times daily as needed (rash).    ? Blood Glucose Monitoring Suppl (FREESTYLE FREEDOM LITE) w/Device KIT Use to check blood sugar 3 times a day. ?Dx code e11.65 1 kit 3  ? clotrimazole-betamethasone (LOTRISONE) cream Apply to affected area 2 times daily prn (Patient taking differently: Apply 1 application. topically 2 (two) times daily as needed (irritation on feet).) 30 g 0  ? ferrous sulfate 325 (65 FE) MG EC tablet Take 1 tablet (325 mg total) by mouth 3 (three) times daily with meals. (Patient not taking: Reported on 06/09/2021) 90 tablet 2  ? FERROUS SULFATE PO Take 2 tablets by mouth daily. gummy    ? Fluocinolone Acetonide 0.01 %  OIL Place 2 drops into both ears at bedtime as needed (earwax./irritation).    ? gabapentin (NEURONTIN) 100 MG capsule TAKE 1 CAPSULE NIGHTLY (Patient not taking: Reported on 06/09/2021) 90 capsule 3  ? glucose blood (FREESTYLE LITE) test strip Use to check blood sugar 3 times a day. Dx code e11.65 150 each 3  ? hydrocortisone 2.5 % cream Apply 1 application. topically daily  as needed (itching).    ? memantine (NAMENDA) 10 MG tablet Take 1 tablet (10 mg total) by mouth 2 (two) times daily. (Patient not taking: Reported on 06/09/2021) 60 tablet 11  ? metFORMIN (GLUCOPHAGE-XR) 500 MG 24 hr tablet TAKE 1 TABLET TWICE A DAY (Patient taking differently: Take 500 mg by mouth in the morning and at bedtime.) 180 tablet 3  ? MICARDIS 40 MG tablet TAKE 1 TABLET DAILY (Patient taking differently: Take 40 mg by mouth daily.) 90 tablet 3  ? mometasone (ELOCON) 0.1 % lotion Apply 1 application topically daily as needed (IRRITATION).   0  ? Multiple Vitamins-Minerals (ALIVE WOMENS 50+ PO) Take 1 tablet by mouth daily.    ? pantoprazole (PROTONIX) 40 MG tablet TAKE 1 TABLET DAILY AT 12 NOON (Patient taking differently: Take 40 mg by mouth daily.) 90 tablet 3  ? pravastatin (PRAVACHOL) 40 MG tablet TAKE 1 TABLET DAILY (Patient taking differently: Take 40 mg by mouth daily.) 90 tablet 3  ? Spacer/Aero-Holding Chambers (AEROCHAMBER PLUS) inhaler Use as instructed 1 each 2  ? Tiotropium Bromide Monohydrate (SPIRIVA RESPIMAT) 2.5 MCG/ACT AERS Inhale 2 puffs into the lungs daily. 12 g 2  ? triamcinolone (KENALOG) 0.1 % Apply 1 application. topically 2 (two) times daily as needed (irritaton on feet).    ? ?No current facility-administered medications on file prior to visit.  ? ?Allergies  ?Allergen Reactions  ? Bactrim [Sulfamethoxazole-Trimethoprim] Swelling  ?  Marked angioedema requiring hospitalization  ? Penicillins Other (See Comments)  ?  Has patient had a PCN reaction causing immediate rash, facial/tongue/throat swelling, SOB or lightheadedness with hypotension: Unk ?Has patient had a PCN reaction causing severe rash involving mucus membranes or skin necrosis: Unk ?Has patient had a PCN reaction that required hospitalization: Unk ?Has patient had a PCN reaction occurring within the last 10 years: No ?If all of the above answers are "NO", then may proceed with Cephalosporin use.  ? ? ?Recent Results  (from the past 2160 hour(s))  ?Iron, TIBC and Ferritin Panel     Status: None  ? Collection Time: 05/25/21  4:28 PM  ?Result Value Ref Range  ? Total Iron Binding Capacity 284 250 - 450 ug/dL  ? UIBC 202 118 - 369 ug/dL  ? Iron 82 27 - 139 ug/dL  ? Iron Saturation 29 15 - 55 %  ? Ferritin 39 15 - 150 ng/mL  ?CMP14+EGFR     Status: Abnormal  ? Collection Time: 05/25/21  4:28 PM  ?Result Value Ref Range  ? Glucose 103 (H) 70 - 99 mg/dL  ? BUN 19 10 - 36 mg/dL  ? Creatinine, Ser 1.20 (H) 0.57 - 1.00 mg/dL  ? eGFR 42 (L) >59 mL/min/1.73  ? BUN/Creatinine Ratio 16 12 - 28  ? Sodium 149 (H) 134 - 144 mmol/L  ? Potassium 4.0 3.5 - 5.2 mmol/L  ? Chloride 110 (H) 96 - 106 mmol/L  ? CO2 26 20 - 29 mmol/L  ? Calcium 9.3 8.7 - 10.3 mg/dL  ? Total Protein 6.4 6.0 - 8.5 g/dL  ? Albumin 4.1 3.5 -  4.6 g/dL  ? Globulin, Total 2.3 1.5 - 4.5 g/dL  ? Albumin/Globulin Ratio 1.8 1.2 - 2.2  ? Bilirubin Total 0.2 0.0 - 1.2 mg/dL  ? Alkaline Phosphatase 87 44 - 121 IU/L  ? AST 20 0 - 40 IU/L  ? ALT 15 0 - 32 IU/L  ?CBC no Diff     Status: Abnormal  ? Collection Time: 05/25/21  4:28 PM  ?Result Value Ref Range  ? WBC 5.4 3.4 - 10.8 x10E3/uL  ? RBC 4.64 3.77 - 5.28 x10E6/uL  ? Hemoglobin 11.6 11.1 - 15.9 g/dL  ? Hematocrit 36.7 34.0 - 46.6 %  ? MCV 79 79 - 97 fL  ? MCH 25.0 (L) 26.6 - 33.0 pg  ? MCHC 31.6 31.5 - 35.7 g/dL  ? RDW 16.7 (H) 11.7 - 15.4 %  ? Platelets Comment 150 - 450 x10E3/uL  ?  Comment: Unable to perform accurate platelet count due to platelet clumps. ?platelets appear decreased on slide. ?  ? Hematology Comments: Note:   ?  Comment: Verified by microscopic examination.  ?TSH     Status: None  ? Collection Time: 05/25/21  4:28 PM  ?Result Value Ref Range  ? TSH 1.800 0.450 - 4.500 uIU/mL  ?Vitamin B12     Status: None  ? Collection Time: 05/25/21  4:28 PM  ?Result Value Ref Range  ? Vitamin B-12 1,152 232 - 1,245 pg/mL  ?Hemoglobin A1c     Status: Abnormal  ? Collection Time: 05/25/21  4:28 PM  ?Result Value Ref Range  ? Hgb  A1c MFr Bld 7.1 (H) 4.8 - 5.6 %  ?  Comment:          Prediabetes: 5.7 - 6.4 ?         Diabetes: >6.4 ?         Glycemic control for adults with diabetes: <7.0 ?  ? Est. average glucose Bld gHb Est-mCnc 157

## 2021-07-02 ENCOUNTER — Ambulatory Visit (INDEPENDENT_AMBULATORY_CARE_PROVIDER_SITE_OTHER): Payer: Medicare Other | Admitting: Internal Medicine

## 2021-07-02 ENCOUNTER — Encounter: Payer: Self-pay | Admitting: Internal Medicine

## 2021-07-02 VITALS — BP 128/68 | HR 76 | Temp 98.1°F | Ht 63.0 in | Wt 174.6 lb

## 2021-07-02 DIAGNOSIS — R911 Solitary pulmonary nodule: Secondary | ICD-10-CM

## 2021-07-02 DIAGNOSIS — R918 Other nonspecific abnormal finding of lung field: Secondary | ICD-10-CM

## 2021-07-02 DIAGNOSIS — R0602 Shortness of breath: Secondary | ICD-10-CM | POA: Diagnosis not present

## 2021-07-02 DIAGNOSIS — Z87891 Personal history of nicotine dependence: Secondary | ICD-10-CM | POA: Diagnosis not present

## 2021-07-02 NOTE — Patient Instructions (Signed)
Please schedule follow up scheduled with myself in 6 months.  If my schedule is not open yet, we will contact you with a reminder closer to that time. Please call [phone] if you haven't heard from [place] a month before.  ? ?Before your next visit I would like you to have: ?CT scan of the chest in 6 months - we will call you to schedule this.  ?This is to follow up on the small spot on the lung.  ? ? ?Continue the spiriva and the albuterol nebulizer treatments as needed. You can take these up to 4 times a day.  ? ?Exercise is good for your heart and lungs and also for your mental health! Go for regular walks with assistance when you can.  ? ?

## 2021-07-02 NOTE — Progress Notes (Signed)
? ?      Leah Wiggins    [id number]    1926/09/08 ? ?Primary Care Physician:Sanders, Bailey Mech, MD ? ?Referring Physician: Glendale Chard, MD ?[address] ?[address] ?[place],  [place] [address] ?Reason for Consultation: shortness of breath ?Date of Consultation: 07/02/2021 ? ?Chief complaint:   ?Chief Complaint  ?Patient presents with  ? Consult  ?  Pt is being referred due to SOB. Pt does have SOB all throughout the day. Does have complaints of wheezing but does not have any complaints of coughing.  ?  ? ?HPI: ?Leah Wiggins is a 88 y.o. woman who presents for new patient evaluation of shortness of breath.  ? ?This is usually right before bed and right when she wakes up in the morning.  ?She has occasional cough which is dry.  Denies chest pain.  ? ?Had ED visit in April 2022 ? ?She is on some breathing treatments and inhalers with her PCP. She is on spiriva and takes prn albuterol.  ? ?Daughter is here with her and help provides history. Overall mom is doing very well and able to complete her ADLs. She does have to stop and rest and albuterol and spiriva do improve her breathing.  ? ?No recurrent hospitalizations or ED visits for breathing other than the one in April.  ? ?Social history: ? 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  ?Exposures: she lives at home with her son.  ?Smoking history: 35 pack years quit 2020 ? ?Social History  ? ?Occupational History  ? Occupation: retired  ?Tobacco Use  ? Smoking status: Former  ?  Packs/day: 0.50  ?  Years: 70.00  ?  Pack years: 35.00  ?  Types: Cigarettes  ?  Start date: 03/08/1947  ?  Quit date: 2020  ?  Years since quitting: 3.3  ? Smokeless tobacco: Never  ? Tobacco comments:  ?  has tried cold [place]  ?Vaping Use  ? Vaping Use: Never used  ?Substance and Sexual Activity  ? Alcohol use: No  ?  Alcohol/week: 0.0 standard drinks  ? Drug use: No  ? Sexual activity: Not Currently  ? ? ?Relevant family  history: ? ?Family History  ?Problem Relation Age of Onset  ? Heart disease Mother   ? Stroke Mother   ? Stroke Father   ? Cancer Sister   ?     GIST  ? Cancer Brother   ?     thyroid   ? Diabetes Son   ? Stroke Son   ? ? ?Past Medical History:  ?Diagnosis Date  ? Arthritis   ? "shoulders" (05/04/2017)  ? Breast cancer, right breast ([place]) 1991  ? GERD (gastroesophageal reflux disease)   ? Hard of hearing   ? Hyperlipidemia   ? Hypertension   ? Memory loss   ? Non-stress test nonreactive, 04/24/12, normal 12/10/2012  ? OSA on CPAP   ? "don't wear it all the time" (05/04/2017)  ? PVD (peripheral vascular disease) ([place])   ? Tobacco use 12/10/2012  ? Type II diabetes mellitus ([place])   ? ? ?Past Surgical History:  ?Procedure Laterality Date  ? APPENDECTOMY    ? BIOPSY  01/19/2018  ? Procedure: BIOPSY;  Surgeon: Carol Ada, MD;  Location: [facility];  Service: Endoscopy;;  ? BREAST BIOPSY Right 1991  ? CARDIOVASCULAR STRESS TEST  04/24/2012  ? No significant ST segment change suggestive of ischemia.  ? CATARACT EXTRACTION,  BILATERAL Bilateral   ? COLONOSCOPY WITH PROPOFOL N/A 01/19/2018  ? Procedure: COLONOSCOPY WITH PROPOFOL;  Surgeon: Carol Ada, MD;  Location: [facility];  Service: Endoscopy;  Laterality: N/A;  ? ESOPHAGOGASTRODUODENOSCOPY (EGD) WITH PROPOFOL N/A 01/19/2018  ? Procedure: ESOPHAGOGASTRODUODENOSCOPY (EGD) WITH PROPOFOL;  Surgeon: Carol Ada, MD;  Location: [facility];  Service: Endoscopy;  Laterality: N/A;  ? JOINT REPLACEMENT    ? LOWER EXTREMITY ARTERIAL DOPPLER Bilateral 05/07/2012  ? Left ABI-demonstrated mild arterial insufficiency. Right CIA 50-69% diameter reduction. Right SFA less than 50% diameter reduction. Left SFA 70-99% diameter reduction. Bilateral Runoff-Posterior tibial arteries appeared occluded.  ? MASTECTOMY PARTIAL / LUMPECTOMY W/ AXILLARY LYMPHADENECTOMY Right 1991   ? TOTAL HIP ARTHROPLASTY Right 2011  ? ? ? ?Physical Exam: ?Blood pressure 128/68, pulse 76, temperature 98.1 ?F  (36.7 ?C), temperature source Oral, height 5\' 3"  (1.6 m), weight 174 lb 9.6 oz (79.2 kg), SpO2 94 %. ?Gen:      No acute distress, elderly, well appearing ?ENT:  no nasal polyps, mucus membranes moist ?Lungs:    No increased respiratory effort, symmetric chest wall excursion, clear to auscultation bilaterally, no wheezes or crackles, kyphosis ?CV:         Regular rate and rhythm; no murmurs, rubs, or gallops.  No pedal edema ?Abd:      + bowel sounds; soft, non-tender; no distension ?MSK: no acute synovitis of DIP or PIP joints, no mechanics hands.  ?Skin:      Warm and dry; no rashes ?Neuro: normal speech, no focal facial asymmetry, HOH ?Psych: alert and oriented x3, normal mood and affect ? ? ?Data Reviewed/Medical Decision Making: ? ?Independent interpretation of tests: ?Imaging: ? Review of patient's CTPE study April 2023 images revealed no PE, no parenchymal lung disease LUL nodule< 1cm. The patient's images have been independently reviewed by me.   ? ?PFTs: ?I have personally reviewed the patient's PFTs and  ?   ? View : No data to display.  ?  ?  ?  ? ? ?Labs:  ?Lab Results  ?Component Value Date  ? WBC 5.9 06/08/2021  ? HGB 11.3 (L) 06/08/2021  ? HCT 36.1 06/08/2021  ? MCV 79.2 (L) 06/08/2021  ? PLT PLATELET CLUMPS NOTED ON SMEAR, UNABLE TO ESTIMATE 06/08/2021  ? ?Lab Results  ?Component Value Date  ? NA 142 06/08/2021  ? K 4.3 06/08/2021  ? CL 105 06/08/2021  ? CO2 29 06/08/2021  ? ? ? ?Immunization status:  ?Immunization History  ?Administered Date(s) Administered  ? Influenza Whole 02/16/2010  ? PFIZER(Purple Top)SARS-COV-2 Vaccination 04/29/2019, 05/20/2019, 01/09/2020, 09/22/2020  ? PNEUMOCOCCAL CONJUGATE-20 01/25/2021  ? Pneumococcal-Unspecified 06/19/2013  ? Tdap 01/25/2021  ? ? ? I reviewed prior external note(s) from PCP, ED ? I reviewed the result(s) of the labs and imaging as noted above.  ? I have ordered CT Chest ? ? ?Assessment:  ?Shortness of breath - likely multifactorial from smoking related  lung disease, deconditioning. ?SPN  ?History of tobacco use disorder ? ?Plan/Recommendations: ?Will follow up CT Chest in 6 months to follow up on SPN.  ?WE discussed PFTs but I think they would be technically challenging for her without much added benefit in changing therapies. Would continue prn albuterol and spiriva since she is having benefit. She can take albuterol up to 4 times a day. Discussed a regular exercise routine as tolerated with walking with assistance.  ? ?We discussed disease management and progression at length today.  ? ? ?Return to Care: ?No  follow-ups on file. ? ?Lenice Llamas, MD ?Pulmonary and Critical Care Medicine ?[facility] ?Office:[phone] ? ?CC: Glendale Chard, MD ? ? ? ?

## 2021-07-13 ENCOUNTER — Inpatient Hospital Stay: Admission: RE | Admit: 2021-07-13 | Payer: Medicare Other | Source: Ambulatory Visit

## 2021-07-14 ENCOUNTER — Ambulatory Visit (INDEPENDENT_AMBULATORY_CARE_PROVIDER_SITE_OTHER): Payer: Medicare Other | Admitting: Cardiovascular Disease

## 2021-07-14 ENCOUNTER — Encounter: Payer: Self-pay | Admitting: Cardiovascular Disease

## 2021-07-14 DIAGNOSIS — I10 Essential (primary) hypertension: Secondary | ICD-10-CM | POA: Diagnosis not present

## 2021-07-14 DIAGNOSIS — E785 Hyperlipidemia, unspecified: Secondary | ICD-10-CM

## 2021-07-14 DIAGNOSIS — I739 Peripheral vascular disease, unspecified: Secondary | ICD-10-CM

## 2021-07-14 NOTE — Assessment & Plan Note (Signed)
History of essential hypertension a blood pressure measured today at 120/60.  She is on Micardis. ?

## 2021-07-14 NOTE — Progress Notes (Signed)
? ? ? ?07/14/2021 ?Leah Wiggins   ?[birth year]  ?[id number] ? ?Primary Physician Leah Chard, MD ?Primary Cardiologist: Leah Harp MD Leah Wiggins, [place] ? ?HPI:  Leah Wiggins is a 88 y.o.   mildly overweight widowed [place] American female, mother of 29, grandmother of greater than 70 grandchildren. She is accompanied by her daughter Leah Wiggins today. I last saw her in the office 07/15/2020.Marland Kitchen Her risk factors include recently discontinued tobacco abuse having smoked for many years and currently wearing a nicotine patch., contrary, hypertension, mild hyperlipidemia, and non-insulin-requiring diabetes. She also has peripheral vascular occlusive disease with Dopplers that show a right ABI of .64 with a high-frequency signal in the origin of the right common iliac artery I saw this progression of disease in the distal right SFA., left ABI of 0.84 with a high-frequency signal in the origin of the left SFA, though she really denies claudication. She had a negative Myoview performed 04/24/12 and more recently 04/29/14 for atypical chest pain. ?  ?Since I saw her in the office a year ago she is remained asymptomatic denying chest pain, she has been complaining of some shortness of breath and has been evaluated by pulmonary on 07/02/2021.  The impression was that this was multifactorial. ?  ? ? ?Current Meds  ?Medication Sig  ? albuterol (PROVENTIL) (2.5 MG/3ML) 0.083% nebulizer solution Take 3 mLs (2.5 mg total) by nebulization every 4 (four) hours as needed for wheezing or shortness of breath.  ? alendronate (FOSAMAX) 70 MG tablet TAKE 1 TABLET EVERY WEDNESDAY WITH A FULL GLASS OF WATER ON AN EMPTY STOMACH (Patient taking differently: Take 70 mg by mouth every Wednesday.)  ? ARTIFICIAL TEAR OP Place 1 drop into both eyes 2 (two) times daily as needed (dry eyes).  ? aspirin EC 81 MG tablet Take 81 mg by mouth daily.  ? betamethasone dipropionate (DIPROLENE) 0.05 % ointment Apply 1 application. topically 2  (two) times daily as needed (rash).  ? Blood Glucose Monitoring Suppl (FREESTYLE FREEDOM LITE) w/Device KIT Use to check blood sugar 3 times a day. ?Dx code e11.65  ? clotrimazole-betamethasone (LOTRISONE) cream Apply to affected area 2 times daily prn (Patient taking differently: Apply 1 application. topically 2 (two) times daily as needed (irritation on feet).)  ? FERROUS SULFATE PO Take 2 tablets by mouth daily. gummy  ? Fluocinolone Acetonide 0.01 % OIL Place 2 drops into both ears at bedtime as needed (earwax./irritation).  ? gabapentin (NEURONTIN) 100 MG capsule TAKE 1 CAPSULE NIGHTLY  ? glucose blood (FREESTYLE LITE) test strip Use to check blood sugar 3 times a day. Dx code e11.65  ? hydrocortisone 2.5 % cream Apply 1 application. topically daily as needed (itching).  ? memantine (NAMENDA) 10 MG tablet Take 1 tablet (10 mg total) by mouth 2 (two) times daily.  ? metFORMIN (GLUCOPHAGE-XR) 500 MG 24 hr tablet TAKE 1 TABLET TWICE A DAY (Patient taking differently: Take 500 mg by mouth in the morning and at bedtime.)  ? MICARDIS 40 MG tablet TAKE 1 TABLET DAILY (Patient taking differently: Take 40 mg by mouth daily.)  ? mometasone (ELOCON) 0.1 % lotion Apply 1 application topically daily as needed (IRRITATION).   ? Multiple Vitamins-Minerals (ALIVE WOMENS 50+ PO) Take 1 tablet by mouth daily.  ? pantoprazole (PROTONIX) 40 MG tablet TAKE 1 TABLET DAILY AT 12 NOON (Patient taking differently: Take 40 mg by mouth daily.)  ? pravastatin (PRAVACHOL) 40 MG tablet TAKE 1 TABLET DAILY (Patient taking differently: Take  40 mg by mouth daily.)  ? Spacer/Aero-Holding Chambers (AEROCHAMBER PLUS) inhaler Use as instructed  ? Tiotropium Bromide Monohydrate (SPIRIVA RESPIMAT) 2.5 MCG/ACT AERS Inhale 2 puffs into the lungs daily.  ? triamcinolone (KENALOG) 0.1 % Apply 1 application. topically 2 (two) times daily as needed (irritaton on feet).  ?  ? ?Allergies  ?Allergen Reactions  ? Bactrim [Sulfamethoxazole-Trimethoprim]  Swelling  ?  Marked angioedema requiring hospitalization  ? Penicillins Other (See Comments)  ?  Has patient had a PCN reaction causing immediate rash, facial/tongue/throat swelling, SOB or lightheadedness with hypotension: Unk ?Has patient had a PCN reaction causing severe rash involving mucus membranes or skin necrosis: Unk ?Has patient had a PCN reaction that required hospitalization: Unk ?Has patient had a PCN reaction occurring within the last 10 years: No ?If all of the above answers are "NO", then may proceed with Cephalosporin use.  ? ? ?Social History  ? ?Socioeconomic History  ? Marital status: Widowed  ?  Spouse name: Not on file  ? Number of children: 8  ? Years of education: 8th grade  ? Highest education level: Not on file  ?Occupational History  ? Occupation: retired  ?Tobacco Use  ? Smoking status: Former  ?  Packs/day: 0.50  ?  Years: 70.00  ?  Pack years: 35.00  ?  Types: Cigarettes  ?  Start date: 03/08/1947  ?  Quit date: 2020  ?  Years since quitting: 3.3  ? Smokeless tobacco: Never  ? Tobacco comments:  ?  has tried cold [place]  ?Vaping Use  ? Vaping Use: Never used  ?Substance and Sexual Activity  ? Alcohol use: No  ?  Alcohol/week: 0.0 standard drinks  ? Drug use: No  ? Sexual activity: Not Currently  ?Other Topics Concern  ? Not on file  ?Social History Narrative  ? Lives at home with her son.  ? She had eight children (two sets of twins).  ? No daily use of caffeine.  ? Right-handed.  ? ?Social Determinants of Health  ? ?Financial Resource Strain: Low Risk   ? Difficulty of Paying Living Expenses: Not hard at all  ?Food Insecurity: No Food Insecurity  ? Worried About Charity fundraiser in the Last Year: Never true  ? Ran Out of Food in the Last Year: Never true  ?Transportation Needs: No Transportation Needs  ? Lack of Transportation (Medical): No  ? Lack of Transportation (Non-Medical): No  ?Physical Activity: Inactive  ? Days of Exercise per Week: 0 days  ? Minutes of Exercise per Session:  0 min  ?Stress: No Stress Concern Present  ? Feeling of Stress : Not at all  ?Social Connections: Not on file  ?Intimate Partner Violence: Not on file  ?  ? ?Review of Systems: ?General: negative for chills, fever, night sweats or weight changes.  ?Cardiovascular: negative for chest pain, dyspnea on exertion, edema, orthopnea, palpitations, paroxysmal nocturnal dyspnea or shortness of breath ?Dermatological: negative for rash ?Respiratory: negative for cough or wheezing ?Urologic: negative for hematuria ?Abdominal: negative for nausea, vomiting, diarrhea, bright red blood per rectum, melena, or hematemesis ?Neurologic: negative for visual changes, syncope, or dizziness ?All other systems reviewed and are otherwise negative except as noted above. ? ? ? ?Blood pressure 120/60, pulse 81, height 5' 3" (1.6 m), weight 173 lb 9.6 oz (78.7 kg), SpO2 96 %.  ?General appearance: alert and no distress ?Neck: no adenopathy, no carotid bruit, no JVD, supple, symmetrical, trachea midline, and thyroid not  enlarged, symmetric, no tenderness/mass/nodules ?Lungs: clear to auscultation bilaterally ?Heart: regular rate and rhythm, S1, S2 normal, no murmur, click, rub or gallop ?Extremities: extremities normal, atraumatic, no cyanosis or edema ?Pulses: Diminished pedal pulses ?Skin: Skin color, texture, turgor normal. No rashes or lesions ?Neurologic: Grossly normal ? ?EKG not performed today ? ?ASSESSMENT AND PLAN:  ? ?PAD (peripheral artery disease), abnormal ABIs ?History of PAD with Dopplers in the past that showed a right ABI of 0.64 with a high-frequency signal in the origin of the right common iliac artery and distal right SFA.  Her left ABI was 0.84 with a high-frequency signal in the origin of her left SFA.  There is no evidence of critical limb ischemia or claudication. ? ?Dyslipidemia ?History of dyslipidemia on statin therapy with lipid profile performed 09/02/2020 revealing total cholesterol 166, LDL 81 and HDL  67. ? ?Essential hypertension ?History of essential hypertension a blood pressure measured today at 120/60.  She is on Micardis. ? ? ? ? ?Leah Harp MD FACP,FACC,FAHA, FSCAI ?07/14/2021 ?4:16 PM ?

## 2021-07-14 NOTE — Assessment & Plan Note (Signed)
History of PAD with Dopplers in the past that showed a right ABI of 0.64 with a high-frequency signal in the origin of the right common iliac artery and distal right SFA.  Her left ABI was 0.84 with a high-frequency signal in the origin of her left SFA.  There is no evidence of critical limb ischemia or claudication. ?

## 2021-07-14 NOTE — Assessment & Plan Note (Signed)
History of dyslipidemia on statin therapy with lipid profile performed 09/02/2020 revealing total cholesterol 166, LDL 81 and HDL 67. ?

## 2021-07-14 NOTE — Patient Instructions (Signed)

## 2021-08-11 ENCOUNTER — Ambulatory Visit: Payer: Medicare Other | Admitting: Cardiovascular Disease

## 2021-09-02 DIAGNOSIS — M26623 Arthralgia of bilateral temporomandibular joint: Secondary | ICD-10-CM | POA: Diagnosis not present

## 2021-09-02 DIAGNOSIS — Z974 Presence of external hearing-aid: Secondary | ICD-10-CM | POA: Diagnosis not present

## 2021-09-02 DIAGNOSIS — H6123 Impacted cerumen, bilateral: Secondary | ICD-10-CM | POA: Diagnosis not present

## 2021-09-02 DIAGNOSIS — H90A21 Sensorineural hearing loss, unilateral, right ear, with restricted hearing on the contralateral side: Secondary | ICD-10-CM | POA: Diagnosis not present

## 2021-09-02 DIAGNOSIS — H9202 Otalgia, left ear: Secondary | ICD-10-CM | POA: Diagnosis not present

## 2021-09-02 DIAGNOSIS — H90A32 Mixed conductive and sensorineural hearing loss, unilateral, left ear with restricted hearing on the contralateral side: Secondary | ICD-10-CM | POA: Diagnosis not present

## 2021-09-02 DIAGNOSIS — H608X3 Other otitis externa, bilateral: Secondary | ICD-10-CM | POA: Diagnosis not present

## 2021-09-02 DIAGNOSIS — Q179 Congenital malformation of ear, unspecified: Secondary | ICD-10-CM | POA: Diagnosis not present

## 2021-09-16 ENCOUNTER — Encounter: Payer: Medicare Other | Admitting: Internal Medicine

## 2021-09-17 ENCOUNTER — Ambulatory Visit: Payer: Medicare Other | Admitting: Podiatry

## 2021-09-18 ENCOUNTER — Ambulatory Visit (HOSPITAL_COMMUNITY)
Admission: RE | Admit: 2021-09-18 | Discharge: 2021-09-18 | Disposition: A | Discharge status: Home / Self Care | Payer: Medicare Other | Source: Ambulatory Visit | Attending: Emergency Medicine | Admitting: Emergency Medicine

## 2021-09-18 ENCOUNTER — Encounter (HOSPITAL_COMMUNITY): Payer: Self-pay

## 2021-09-18 VITALS — BP 132/55 | HR 74 | Temp 99.0°F | Resp 20

## 2021-09-18 DIAGNOSIS — H9202 Otalgia, left ear: Secondary | ICD-10-CM

## 2021-09-18 DIAGNOSIS — H60543 Acute eczematoid otitis externa, bilateral: Secondary | ICD-10-CM | POA: Diagnosis not present

## 2021-09-18 MED ORDER — FLUOCINOLONE ACETONIDE 0.01 % OT OIL: 3.0000 [drp] | TOPICAL_OIL | Freq: Two times a day (BID) | OTIC | 0 refills | Status: DC

## 2021-09-18 NOTE — ED Triage Notes (Signed)
For a week having left eye pain and left head pains and left ear.

## 2021-09-18 NOTE — ED Provider Notes (Signed)
Leah Wiggins    CSN: [id number] Arrival date & time: 09/18/21  1636     History   Chief Complaint Chief Complaint  Patient presents with   Headache    HPI Leah Wiggins is a 88 y.o. female.  Presents with one week history of left eye and ear pain. Left eye was red and irritated but has since resolved. Left ear continued pain, chronically. Was seen two weeks ago by otolaryngology for same, dx with congenital small ear canals, TMJ tenderness, bilateral impacted cerumen with chronic eczematous otitis externa, conductive and sensorineural hearing loss.  Was advised to take tylenol but per daughter, she has not been taking. Usually uses Dermotic ear drops with relief, although they were not refilled and she has run out. Denies headache. Denies rash or burning sensation on the face.  History of bilat cataracts, surgical removal. Has upcoming appointment with eye doctor as well as primary care.  Past Medical History:  Diagnosis Date   Arthritis    "shoulders" (05/04/2017)   Breast cancer, right breast ([place]) 1991   GERD (gastroesophageal reflux disease)    Hard of hearing    Hyperlipidemia    Hypertension    Memory loss    Non-stress test nonreactive, 04/24/12, normal 12/10/2012   OSA on CPAP    "don't wear it all the time" (05/04/2017)   PVD (peripheral vascular disease) ([place])    Tobacco use 12/10/2012   Type II diabetes mellitus ([place])     Patient Active Problem List   Diagnosis Date Noted   Hypertensive nephropathy 09/03/2020   Type 2 diabetes mellitus with stage 2 chronic kidney disease, without long-term current use of insulin ([place]) 09/03/2020   Atherosclerosis of aorta ([place]) 09/03/2020   Absolute anemia 09/03/2020   Cellulitis of left foot 09/03/2020   Class 1 obesity due to excess calories with serious comorbidity and body mass index (BMI) of 33.0 to 33.9 in adult 09/03/2020   Abnormal feces 06/04/2020   Abnormal weight loss 06/04/2020   Angiodysplasia  of intestine 06/04/2020   Colon cancer screening 06/04/2020   Gastroesophageal reflux disease 06/04/2020   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 hearing of right ear 06/04/2020   Prolapsed internal hemorrhoids 06/04/2020   Rectal bleeding 06/04/2020   Sensorineural hearing loss (SNHL) of right ear with restricted hearing of left ear 06/04/2020   Mild cognitive impairment 12/23/2019   Memory loss 06/24/2019   Intrinsic atopic dermatitis 09/18/2018   Chronic obstructive pulmonary disease ([place]) 04/06/2018   Prediabetes 02/07/2018   Iron deficiency anemia due to chronic blood loss 12/25/2017   Anaphylaxis 05/04/2017   Rash and nonspecific skin eruption 05/04/2017   History of breast cancer 08/14/2014   Empty sella ([place]) 07/14/2014   Pain in the chest 04/28/2014   Diabetes mellitus without complication ([place]) 67/54/4920   Chest pain 03/03/2014   Essential hypertension 06/24/2013   PAD (peripheral artery disease), abnormal ABIs 12/10/2012   Non-stress test nonreactive, 04/24/12, normal 12/10/2012   Dyslipidemia 12/10/2012   Tobacco use 12/10/2012   Thrombocytopenia ([place]) 01/24/2012   RESTLESS LEG SYNDROME 02/16/2010   METHICILLIN SUSECPTIBLE PNEUMONIA STAPH AUREUS 01/13/2010   HIP THI LEG&ANK ABRASION/FRICION BURN W/O INF 01/13/2010    Past Surgical History:  Procedure Laterality Date   APPENDECTOMY     BIOPSY  01/19/2018   Procedure: BIOPSY;  Surgeon: Carol Ada, MD;  Location: [facility];  Service: Endoscopy;;   BREAST BIOPSY Right 1991   CARDIOVASCULAR STRESS  TEST  04/24/2012   No significant ST segment change suggestive of ischemia.   CATARACT EXTRACTION, BILATERAL Bilateral    COLONOSCOPY WITH PROPOFOL N/A 01/19/2018   Procedure: COLONOSCOPY WITH PROPOFOL;  Surgeon: Carol Ada, MD;  Location: [facility];  Service: Endoscopy;  Laterality: N/A;   ESOPHAGOGASTRODUODENOSCOPY (EGD) WITH PROPOFOL N/A 01/19/2018   Procedure:  ESOPHAGOGASTRODUODENOSCOPY (EGD) WITH PROPOFOL;  Surgeon: Carol Ada, MD;  Location: [facility];  Service: Endoscopy;  Laterality: N/A;   JOINT REPLACEMENT     LOWER EXTREMITY ARTERIAL DOPPLER Bilateral 05/07/2012   Left ABI-demonstrated mild arterial insufficiency. Right CIA 50-69% diameter reduction. Right SFA less than 50% diameter reduction. Left SFA 70-99% diameter reduction. Bilateral [place]-Posterior tibial arteries appeared occluded.   MASTECTOMY PARTIAL / LUMPECTOMY W/ AXILLARY LYMPHADENECTOMY Right 1991    TOTAL HIP ARTHROPLASTY Right 2011    OB History   No obstetric history on file.      Home Medications    Prior to Admission medications   Medication Sig Start Date End Date Taking? Authorizing Provider  Fluocinolone Acetonide (DERMOTIC) 0.01 % OIL Place 3 drops in ear(s) in the morning and at bedtime. 09/18/21  Yes , Wells Guiles, PA-C  albuterol (PROVENTIL) (2.5 MG/3ML) 0.083% nebulizer solution Take 3 mLs (2.5 mg total) by nebulization every 4 (four) hours as needed for wheezing or shortness of breath. 08/06/20 08/06/21  Glendale Chard, MD  alendronate (FOSAMAX) 70 MG tablet TAKE 1 TABLET EVERY WEDNESDAY WITH A FULL GLASS OF WATER ON AN EMPTY STOMACH Patient taking differently: Take 70 mg by mouth every Wednesday. 03/30/21   Glendale Chard, MD  ARTIFICIAL TEAR OP Place 1 drop into both eyes 2 (two) times daily as needed (dry eyes).    [provider]  aspirin EC 81 MG tablet Take 81 mg by mouth daily.    [provider]  betamethasone dipropionate (DIPROLENE) 0.05 % ointment Apply 1 application. topically 2 (two) times daily as needed (rash). 04/19/19   [provider]  Blood Glucose Monitoring Suppl (FREESTYLE FREEDOM LITE) w/Device KIT Use to check blood sugar 3 times a day. Dx code e11.65 09/18/19   Glendale Chard, MD  clotrimazole-betamethasone (LOTRISONE) cream Apply to affected area 2 times daily prn Patient taking differently: Apply 1  application. topically 2 (two) times daily as needed (irritation on feet). 10/31/15   Janne Napoleon, NP  ferrous sulfate 325 (65 FE) MG EC tablet Take 1 tablet (325 mg total) by mouth 3 (three) times daily with meals. Patient not taking: Reported on 06/09/2021 12/25/17 05/25/21  Henreitta Leber, MD  FERROUS SULFATE PO Take 2 tablets by mouth daily. gummy    [provider]  gabapentin (NEURONTIN) 100 MG capsule TAKE 1 CAPSULE NIGHTLY 12/28/20   Glendale Chard, MD  glucose blood (FREESTYLE LITE) test strip Use to check blood sugar 3 times a day. Dx code e11.65 01/10/20   Glendale Chard, MD  hydrocortisone 2.5 % cream Apply 1 application. topically daily as needed (itching). 09/04/19   [provider]  memantine (NAMENDA) 10 MG tablet Take 1 tablet (10 mg total) by mouth 2 (two) times daily. 12/23/19   Marcial Pacas, MD  metFORMIN (GLUCOPHAGE-XR) 500 MG 24 hr tablet TAKE 1 TABLET TWICE A DAY Patient taking differently: Take 500 mg by mouth in the morning and at bedtime. 04/23/21   Glendale Chard, MD  MICARDIS 40 MG tablet TAKE 1 TABLET DAILY Patient taking differently: Take 40 mg by mouth daily. 02/05/21   Glendale Chard, MD  mometasone (  ELOCON) 0.1 % lotion Apply 1 application topically daily as needed (IRRITATION).  04/15/14   [provider]  Multiple Vitamins-Minerals (ALIVE WOMENS 50+ PO) Take 1 tablet by mouth daily.    [provider]  pantoprazole (PROTONIX) 40 MG tablet TAKE 1 TABLET DAILY AT 12 NOON Patient taking differently: Take 40 mg by mouth daily. 04/27/20   Glendale Chard, MD  pravastatin (PRAVACHOL) 40 MG tablet TAKE 1 TABLET DAILY Patient taking differently: Take 40 mg by mouth daily. 05/03/21   Glendale Chard, MD  Spacer/Aero-Holding Chambers (AEROCHAMBER PLUS) inhaler Use as instructed 08/19/19   Glendale Chard, MD  Tiotropium Bromide Monohydrate (SPIRIVA RESPIMAT) 2.5 MCG/ACT AERS Inhale 2 puffs into the lungs daily. 02/06/21   Glendale Chard, MD   triamcinolone (KENALOG) 0.1 % Apply 1 application. topically 2 (two) times daily as needed (irritaton on feet). 02/11/19   [provider]    Family History Family History  Problem Relation Age of Onset   Heart disease Mother    Stroke Mother    Stroke Father    Cancer Sister        GIST   Cancer Brother        thyroid    Diabetes Son    Stroke Son     Social History Social History   Tobacco Use   Smoking status: Former    Packs/day: 0.50    Years: 70.00    Total pack years: 35.00    Types: Cigarettes    Start date: [birth year]08/1947    Quit date: 2020    Years since quitting: 3.5   Smokeless tobacco: Never   Tobacco comments:    has tried cold [place]  Vaping Use   Vaping Use: Never used  Substance Use Topics   Alcohol use: No    Alcohol/week: 0.0 standard drinks of alcohol   Drug use: No     Allergies   Bactrim [sulfamethoxazole-trimethoprim] and Penicillins   Review of Systems Review of Systems  HENT:  Positive for ear pain.   Eyes:  Positive for pain.   Per HPI  Physical Exam Triage Vital Signs ED Triage Vitals [09/18/21 1705]  Enc Vitals Group     BP (!) 132/55     Pulse Rate 74     Resp 20     Temp 99 F (37.2 C)     Temp Source Oral     SpO2 96 %     Weight      Height      Head Circumference      Peak Flow      Pain Score      Pain Loc      Pain Edu?      Excl. in [place]?    No data found.  Updated Vital Signs BP (!) 132/55 (BP Location: Left Arm)   Pulse 74   Temp 99 F (37.2 C) (Oral)   Resp 20   SpO2 96%    Physical Exam Vitals and nursing note reviewed.  Constitutional:      Comments: Well appearing, pleasant, appears younger than stated age  HENT:     Right Ear: External ear normal. Decreased hearing noted. No drainage or tenderness. There is no impacted cerumen. No mastoid tenderness.     Left Ear: External ear normal. Decreased hearing noted. No drainage or tenderness. There is no impacted cerumen. No mastoid  tenderness.     Ears:     Comments: Scaling and irritation of bilat  ear canals, appears to be otitis externa. Fairly clear of wax    Mouth/Throat:     Pharynx: Oropharynx is clear.  Eyes:     General: Lids are normal. No scleral icterus.       Right eye: No discharge.        Left eye: No discharge.     Extraocular Movements: Extraocular movements intact.     Conjunctiva/sclera:     Right eye: Right conjunctiva is not injected.     Left eye: Left conjunctiva is not injected.     Pupils: Pupils are equal, round, and reactive to light.  Cardiovascular:     Rate and Rhythm: Normal rate and regular rhythm.     Pulses: Normal pulses.     Heart sounds: Normal heart sounds.  Pulmonary:     Effort: Pulmonary effort is normal.     Breath sounds: Normal breath sounds.  Skin:    General: Skin is warm and dry.  Neurological:     Mental Status: She is alert and oriented to person, place, and time.     Cranial Nerves: No cranial nerve deficit or facial asymmetry.     UC Treatments / Results  Labs (all labs ordered are listed, but only abnormal results are displayed) Labs Reviewed - No data to display  EKG  Radiology No results found.  Procedures Procedures (including critical care time)  Medications Ordered in UC Medications - No data to display  Initial Impression / Assessment and Plan / UC Course  I have reviewed the triage vital signs and the nursing notes.  Pertinent labs & imaging results that were available during my care of the patient were reviewed by me and considered in my medical decision making (see chart for details).  Well appearing, no rash or signs of infection on the face. Ear with scaling, refilled drops to use. No jaw pain or tenderness. Eye is normal without movement pain or injection. I recommend tylenol for her ear/eye pain, eye and face pain may be related to ear symptoms. She agrees to try tylenol and monitor her symptoms. Will follow up with eye doctor,  primary care. Strict return precautions. Daughter (and caregiver) and patient agree to plan, discharged in stable condition.  Final Clinical Impressions(s) / UC Diagnoses   Final diagnoses:  Acute eczematoid otitis externa of both ears  Referred otalgia of left ear     Discharge Instructions      Use the drops in both ears, twice daily. This will help with ear pain and itching.  Try tylenol every 6-8 hours for ear or face pain.  Follow up with your eye doctor and primary care provider as needed.     ED Prescriptions     Medication Sig Dispense Auth. Provider   Fluocinolone Acetonide (DERMOTIC) 0.01 % OIL Place 3 drops in ear(s) in the morning and at bedtime. 20 mL , Wells Guiles, PA-C      PDMP not reviewed this encounter.   , Vernice Jefferson 09/18/21 1743

## 2021-09-18 NOTE — Discharge Instructions (Addendum)
Use the drops in both ears, twice daily. This will help with ear pain and itching.  Try tylenol every 6-8 hours for ear or face pain.  Follow up with your eye doctor and primary care provider as needed.

## 2021-09-20 ENCOUNTER — Ambulatory Visit (INDEPENDENT_AMBULATORY_CARE_PROVIDER_SITE_OTHER): Payer: Medicare Other | Admitting: Podiatry

## 2021-09-20 ENCOUNTER — Encounter: Payer: Self-pay | Admitting: Podiatry

## 2021-09-20 DIAGNOSIS — I739 Peripheral vascular disease, unspecified: Secondary | ICD-10-CM

## 2021-09-20 DIAGNOSIS — E1122 Type 2 diabetes mellitus with diabetic chronic kidney disease: Secondary | ICD-10-CM

## 2021-09-20 DIAGNOSIS — B351 Tinea unguium: Secondary | ICD-10-CM

## 2021-09-20 DIAGNOSIS — N182 Chronic kidney disease, stage 2 (mild): Secondary | ICD-10-CM

## 2021-09-20 DIAGNOSIS — M79609 Pain in unspecified limb: Secondary | ICD-10-CM | POA: Diagnosis not present

## 2021-09-20 DIAGNOSIS — L853 Xerosis cutis: Secondary | ICD-10-CM | POA: Diagnosis not present

## 2021-09-20 NOTE — Progress Notes (Signed)
This patient returns to my office for at risk foot care.  This patient requires this care by a professional since this patient will be at risk due to having  DM  CKD and thrombocytopenia.  This patient is unable to cut nails herself since the patient cannot reach her nails.These nails are painful walking and wearing shoes.  The patient is accompanied by her daughter. This patient presents for at risk foot care today.  General Appearance  Alert, conversant and in no acute stress.  Vascular  Dorsalis pedis and posterior tibial  pulses are  weakly palpable  bilaterally.  Capillary return is within normal limits  bilaterally. Temperature is within normal limits  bilaterally.  Neurologic  Senn-Weinstein monofilament wire test within normal limits  bilaterally. Muscle power within normal limits bilaterally.  Nails Thick disfigured discolored nails with subungual debris  from hallux to fifth toes bilaterally. No evidence of bacterial infection or drainage bilaterally.  Orthopedic  No limitations of motion  feet .  No crepitus or effusions noted.  No bony pathology or digital deformities noted.  Skin  normotropic skin with no porokeratosis noted bilaterally.  No signs of infections or ulcers noted.     Onychomycosis  Pain in right toes  Pain in left toes  Consent was obtained for treatment procedures.   Mechanical debridement of nails 1-5  bilaterally performed with a nail nipper.  Filed with dremel without incident.    Return office visit    3 months                  Told patient to return for periodic foot care and evaluation due to potential at risk complications.   Gardiner Barefoot DPM

## 2021-10-04 ENCOUNTER — Other Ambulatory Visit: Payer: Self-pay

## 2021-10-04 ENCOUNTER — Encounter: Payer: Self-pay | Admitting: Internal Medicine

## 2021-10-04 ENCOUNTER — Ambulatory Visit (INDEPENDENT_AMBULATORY_CARE_PROVIDER_SITE_OTHER): Payer: Medicare Other | Admitting: Internal Medicine

## 2021-10-04 VITALS — BP 122/64 | HR 73 | Temp 98.7°F | Ht 63.0 in | Wt 172.0 lb

## 2021-10-04 DIAGNOSIS — N1831 Chronic kidney disease, stage 3a: Secondary | ICD-10-CM | POA: Diagnosis not present

## 2021-10-04 DIAGNOSIS — Z0000 Encounter for general adult medical examination without abnormal findings: Secondary | ICD-10-CM | POA: Diagnosis not present

## 2021-10-04 DIAGNOSIS — Z23 Encounter for immunization: Secondary | ICD-10-CM

## 2021-10-04 DIAGNOSIS — I129 Hypertensive chronic kidney disease with stage 1 through stage 4 chronic kidney disease, or unspecified chronic kidney disease: Secondary | ICD-10-CM

## 2021-10-04 DIAGNOSIS — I130 Hypertensive heart and chronic kidney disease with heart failure and stage 1 through stage 4 chronic kidney disease, or unspecified chronic kidney disease: Secondary | ICD-10-CM

## 2021-10-04 DIAGNOSIS — I739 Peripheral vascular disease, unspecified: Secondary | ICD-10-CM | POA: Diagnosis not present

## 2021-10-04 DIAGNOSIS — E1122 Type 2 diabetes mellitus with diabetic chronic kidney disease: Secondary | ICD-10-CM | POA: Diagnosis not present

## 2021-10-04 DIAGNOSIS — J449 Chronic obstructive pulmonary disease, unspecified: Secondary | ICD-10-CM | POA: Diagnosis not present

## 2021-10-04 DIAGNOSIS — Z6830 Body mass index (BMI) 30.0-30.9, adult: Secondary | ICD-10-CM

## 2021-10-04 DIAGNOSIS — E6609 Other obesity due to excess calories: Secondary | ICD-10-CM

## 2021-10-04 DIAGNOSIS — E236 Other disorders of pituitary gland: Secondary | ICD-10-CM

## 2021-10-04 LAB — POCT URINALYSIS DIPSTICK
Bilirubin, UA: NEGATIVE
Glucose, UA: NEGATIVE
Ketones, UA: NEGATIVE
Nitrite, UA: NEGATIVE
Protein, UA: NEGATIVE
Spec Grav, UA: 1.02 (ref 1.010–1.025)
Urobilinogen, UA: 0.2 E.U./dL
pH, UA: 5 (ref 5.0–8.0)

## 2021-10-04 MED ORDER — ALENDRONATE SODIUM 70 MG PO TABS: 70.0000 mg | ORAL_TABLET | ORAL | 3 refills | Status: DC

## 2021-10-04 MED ORDER — SHINGRIX 50 MCG/0.5ML IM SUSR: 0.5000 mL | Freq: Once | INTRAMUSCULAR | 0 refills | Status: AC

## 2021-10-04 NOTE — Progress Notes (Signed)
Rich Brave [place]ittleton,acting as a Education administrator for Maximino Greenland, MD.,have documented all relevant documentation on the behalf of Maximino Greenland, MD,as directed by  Maximino Greenland, MD while in the presence of Maximino Greenland, MD.   Subjective:     Patient ID: Leah Wiggins , female    DOB: [birth year] , 88 y.o.   MRN: [id number]   Chief Complaint  Patient presents with   Annual Exam   Diabetes   Hypertension    HPI  Patient here for physical. She is accompanied by her daughter. She is no longer followed by GYN. She reports compliance with meds. She denies having any chest pain, worsening shortness of breath, palpitations and visual disturbances.   Diabetes She presents for her follow-up diabetic visit. She has type 2 diabetes mellitus. Her disease course has been stable. There are no hypoglycemic associated symptoms. Pertinent negatives for diabetes include no blurred vision. There are no hypoglycemic complications. Risk factors for coronary artery disease include diabetes mellitus, dyslipidemia, hypertension, obesity, sedentary lifestyle and post-menopausal.  Hypertension This is a chronic problem. The current episode started more than 1 year ago. The problem has been gradually improving since onset. The problem is controlled. Pertinent negatives include no blurred vision. The current treatment provides moderate improvement.     Past Medical History:  Diagnosis Date   Arthritis    "shoulders" (05/04/2017)   Breast cancer, right breast ([place]) 1991   GERD (gastroesophageal reflux disease)    Hard of hearing    Hyperlipidemia    Hypertension    Memory loss    Non-stress test nonreactive, 04/24/12, normal 12/10/2012   OSA on CPAP    "don't wear it all the time" (05/04/2017)   PVD (peripheral vascular disease) ([place])    Tobacco use 12/10/2012   Type II diabetes mellitus ([place])      Family History  Problem Relation Age of Onset   Heart disease Mother    Stroke Mother    Stroke  Father    Cancer Sister        GIST   Cancer Brother        thyroid    Diabetes Son    Stroke Son      Current Outpatient Medications:    albuterol (PROVENTIL) (2.5 MG/3ML) 0.083% nebulizer solution, Take 3 mLs (2.5 mg total) by nebulization every 4 (four) hours as needed for wheezing or shortness of breath., Disp: 75 mL, Rfl: 2   ARTIFICIAL TEAR OP, Place 1 drop into both eyes 2 (two) times daily as needed (dry eyes)., Disp: , Rfl:    aspirin EC 81 MG tablet, Take 81 mg by mouth daily., Disp: , Rfl:    betamethasone dipropionate (DIPROLENE) 0.05 % ointment, Apply 1 application. topically 2 (two) times daily as needed (rash)., Disp: , Rfl:    Blood Glucose Monitoring Suppl (FREESTYLE FREEDOM LITE) w/Device KIT, Use to check blood sugar 3 times a day. Dx code e11.65, Disp: 1 kit, Rfl: 3   clotrimazole-betamethasone (LOTRISONE) cream, Apply to affected area 2 times daily prn (Patient taking differently: Apply 1 application  topically 2 (two) times daily as needed (irritation on feet).), Disp: 30 g, Rfl: 0   ferrous sulfate 325 (65 FE) MG EC tablet, Take 1 tablet (325 mg total) by mouth 3 (three) times daily with meals., Disp: 90 tablet, Rfl: 2   FERROUS SULFATE PO, Take 2 tablets by mouth daily. gummy, Disp: , Rfl:    Fluocinolone Acetonide (DERMOTIC) 0.01 %  OIL, Place 3 drops in ear(s) in the morning and at bedtime., Disp: 20 mL, Rfl: 0   gabapentin (NEURONTIN) 100 MG capsule, TAKE 1 CAPSULE NIGHTLY, Disp: 90 capsule, Rfl: 3   glucose blood (FREESTYLE LITE) test strip, Use to check blood sugar 3 times a day. Dx code e11.65, Disp: 150 each, Rfl: 3   hydrocortisone 2.5 % cream, Apply 1 application. topically daily as needed (itching)., Disp: , Rfl:    memantine (NAMENDA) 10 MG tablet, Take 1 tablet (10 mg total) by mouth 2 (two) times daily., Disp: 60 tablet, Rfl: 11   metFORMIN (GLUCOPHAGE-XR) 500 MG 24 hr tablet, TAKE 1 TABLET TWICE A DAY (Patient taking differently: Take 500 mg by mouth in  the morning and at bedtime.), Disp: 180 tablet, Rfl: 3   MICARDIS 40 MG tablet, TAKE 1 TABLET DAILY (Patient taking differently: Take 40 mg by mouth daily.), Disp: 90 tablet, Rfl: 3   mometasone (ELOCON) 0.1 % lotion, Apply 1 application topically daily as needed (IRRITATION). , Disp: , Rfl: 0   Multiple Vitamins-Minerals (ALIVE WOMENS 50+ PO), Take 1 tablet by mouth daily., Disp: , Rfl:    pantoprazole (PROTONIX) 40 MG tablet, TAKE 1 TABLET DAILY AT 12 NOON (Patient taking differently: Take 40 mg by mouth daily.), Disp: 90 tablet, Rfl: 3   pravastatin (PRAVACHOL) 40 MG tablet, TAKE 1 TABLET DAILY (Patient taking differently: Take 40 mg by mouth daily.), Disp: 90 tablet, Rfl: 3   Spacer/Aero-Holding Chambers (AEROCHAMBER PLUS) inhaler, Use as instructed, Disp: 1 each, Rfl: 2   Tiotropium Bromide Monohydrate (SPIRIVA RESPIMAT) 2.5 MCG/ACT AERS, Inhale 2 puffs into the lungs daily., Disp: 12 g, Rfl: 2   triamcinolone (KENALOG) 0.1 %, Apply 1 application. topically 2 (two) times daily as needed (irritaton on feet)., Disp: , Rfl:    alendronate (FOSAMAX) 70 MG tablet, Take 1 tablet (70 mg total) by mouth once a week. TAKE 1 TABLET EVERY WEDNESDAY WITH A FULL GLASS OF WATER ON AN EMPTY STOMACH Strength: 70 mg, Disp: 12 tablet, Rfl: 3   Allergies  Allergen Reactions   Bactrim [Sulfamethoxazole-Trimethoprim] Swelling    Marked angioedema requiring hospitalization   Penicillins Other (See Comments)    Has patient had a PCN reaction causing immediate rash, facial/tongue/throat swelling, SOB or lightheadedness with hypotension: Unk Has patient had a PCN reaction causing severe rash involving mucus membranes or skin necrosis: Unk Has patient had a PCN reaction that required hospitalization: Unk Has patient had a PCN reaction occurring within the last 10 years: No If all of the above answers are "NO", then may proceed with Cephalosporin use.      The patient states she uses post menopausal status for  birth control. Last LMP was No LMP recorded. Patient is postmenopausal.. Negative for Dysmenorrhea. Negative for: breast discharge, breast lump(s), breast pain and breast self exam. Associated symptoms include abnormal vaginal bleeding. Pertinent negatives include abnormal bleeding (hematology), anxiety, decreased libido, depression, difficulty falling sleep, dyspareunia, history of infertility, nocturia, sexual dysfunction, sleep disturbances, urinary incontinence, urinary urgency, vaginal discharge and vaginal itching. Diet regular.The patient states her exercise level is  minimal.  . The patient's tobacco use is:  Social History   Tobacco Use  Smoking Status Former   Packs/day: 0.50   Years: 70.00   Total pack years: 35.00   Types: Cigarettes   Start date: 03/08/1947   Quit date: 2020   Years since quitting: 3.6  Smokeless Tobacco Never  Tobacco Comments   has tried cold  [place]  . She has been exposed to passive smoke. The patient's alcohol use is:  Social History   Substance and Sexual Activity  Alcohol Use No   Alcohol/week: 0.0 standard drinks of alcohol   Review of Systems  Constitutional: Negative.   HENT: Negative.    Eyes: Negative.  Negative for blurred vision.  Respiratory: Negative.    Cardiovascular: Negative.   Gastrointestinal: Negative.   Endocrine: Negative.   Genitourinary: Negative.   Musculoskeletal: Negative.   Skin: Negative.   Allergic/Immunologic: Negative.   Neurological: Negative.   Hematological: Negative.   Psychiatric/Behavioral: Negative.       Today's Vitals   10/04/21 1142  BP: 122/64  Pulse: 73  Temp: 98.7 F (37.1 C)  TempSrc: Oral  Weight: 172 lb (78 kg)  Height: _0  (1.6 m)   Body mass index is 30.47 kg/m.  Wt Readings from Last 3 Encounters:  10/04/21 172 lb (78 kg)  07/14/21 173 lb 9.6 oz (78.7 kg)  07/02/21 174 lb 9.6 oz (79.2 kg)    Objective:  Physical Exam Vitals and nursing note reviewed.  Constitutional:       Appearance: Normal appearance.  HENT:     Head: Normocephalic and atraumatic.     Right Ear: Tympanic membrane, ear canal and external ear normal.     Left Ear: Tympanic membrane, ear canal and external ear normal.     Nose: Nose normal.     Mouth/Throat:     Mouth: Mucous membranes are moist.     Pharynx: Oropharynx is clear.  Eyes:     Extraocular Movements: Extraocular movements intact.     Conjunctiva/sclera: Conjunctivae normal.     Pupils: Pupils are equal, round, and reactive to light.  Cardiovascular:     Rate and Rhythm: Normal rate and regular rhythm.     Pulses:          Dorsalis pedis pulses are 1+ on the right side and 1+ on the left side.       Posterior tibial pulses are 0 on the right side and 0 on the left side.     Heart sounds: Normal heart sounds.  Pulmonary:     Effort: Pulmonary effort is normal.     Breath sounds: Normal breath sounds.     Comments: Decreased breath sounds at bases b/l Chest:  Breasts:    Tanner Score is 5.     Right: Absent.     Left: Normal.  Abdominal:     General: Abdomen is flat. Bowel sounds are normal.     Palpations: Abdomen is soft.  Genitourinary:    Comments: deferred Musculoskeletal:        General: Normal range of motion.     Cervical back: Normal range of motion and neck supple.  Feet:     Right foot:     Protective Sensation: 5 sites tested.  5 sites sensed.     Skin integrity: Callus and dry skin present.     Toenail Condition: Right toenails are abnormally thick.     Left foot:     Protective Sensation: 5 sites tested.  5 sites sensed.     Skin integrity: Callus and dry skin present.     Toenail Condition: Left toenails are abnormally thick.  Skin:    General: Skin is warm and dry.     Comments: Hyperpigmentation b/l ankles/feet, brawny skin  Neurological:     General: No focal deficit present.  Mental Status: She is alert and oriented to person, place, and time.  Psychiatric:        Mood and Affect: Mood  normal.        Behavior: Behavior normal.      Assessment And Plan:     1. Encounter for general adult medical examination w/o abnormal findings Comments: A full exam was performed. Importance of monthly self breast exams was discussed with the patient. PATIENT IS ADVISED TO GET 30-45 MINUTES REGULAR EXERCISE NO LESS THAN FOUR TO FIVE DAYS PER WEEK - BOTH WEIGHTBEARING EXERCISES AND AEROBIC ARE RECOMMENDED.  PATIENT IS ADVISED TO FOLLOW A HEALTHY DIET WITH AT LEAST SIX FRUITS/VEGGIES PER DAY, DECREASE INTAKE OF RED MEAT, AND TO INCREASE FISH INTAKE TO TWO DAYS PER WEEK.  MEATS/FISH SHOULD NOT BE FRIED, BAKED OR BROILED IS PREFERABLE.  IT IS ALSO IMPORTANT TO CUT BACK ON YOUR SUGAR INTAKE. PLEASE AVOID ANYTHING WITH ADDED SUGAR, CORN SYRUP OR OTHER SWEETENERS. IF YOU MUST USE A SWEETENER, YOU CAN TRY STEVIA. IT IS ALSO IMPORTANT TO AVOID ARTIFICIALLY SWEETENERS AND DIET BEVERAGES. LASTLY, I SUGGEST WEARING SPF 50 SUNSCREEN ON EXPOSED PARTS AND ESPECIALLY WHEN IN THE DIRECT SUNLIGHT FOR AN EXTENDED PERIOD OF TIME.  PLEASE AVOID FAST FOOD RESTAURANTS AND INCREASE YOUR WATER INTAKE.  2. Type 2 diabetes mellitus with stage 3a chronic kidney disease, without long-term current use of insulin ([facility]) Comments: Diabetic foot exam was performed. She will rto in 4 months for re-evaluation. I will refer her to Dr. Melony Overly, [facility] pod[facility]. I DISCUSSED WITH THE PATIENT AT LENGTH REGARDING THE GOALS OF GLYCEMIC CONTROL AND POSSIBLE LONG-TERM COMPLICATIONS.  I  ALSO STRESSED THE IMPORTANCE OF COMPLIANCE WITH HOME GLUCOSE MONITORING, DIETARY RESTRICTIONS INCLUDING AVOIDANCE OF SUGARY DRINKS/PROCESSED FOODS,  ALONG WITH REGULAR EXERCISE.  I  ALSO STRESSED THE IMPORTANCE OF ANNUAL EYE EXAMS, SELF FOOT CARE AND COMPLIANCE WITH OFFICE VISITS. - POCT Urinalysis Dipstick (81002) - Microalbumin / Creatinine Urine Ratio - Hemoglobin A1c - CMP14+EGFR - Lipid panel - PTH, intact and calcium - Phosphorus - Protein electrophoresis,  serum - US Renal; Future - Ambulatory referral to Podiatry  3. Hypertensive nephropathy Comments: Chronic, well controlled. EKG not performed, April 2023 results reviewed. She is encouraged to follow low sodium diet. She will f/u in 6 months.  - CMP14+EGFR - Lipid panel  4. Chronic obstructive pulmonary disease, unspecified COPD type ([place]) Comments: Chronic, importance of smoking cessation was d/w patient. She claims to have cut back. Pulmonary input is appreciated.   5. PVD (peripheral vascular disease) ([place]) Comments: Chronic, again importance of smoking cessation, ASA, statin therapy and regular exercise was stressed to the patient. LDL goal <70.  6. Immunization due Comments: I will send rx Shingrix to her local pharmacy. Her insurance will not allow her to use [facility].   7. Class 1 obesity due to excess calories with serious comorbidity and body mass index (BMI) of 30.0 to 30.9 in adult She is encouraged to strive for BMI less than 30 to decrease cardiac risk. Advised to aim for at least 150 minutes of exercise per week.  Patient was given opportunity to ask questions. Patient verbalized understanding of the plan and was able to repeat key elements of the plan. All questions were answered to their satisfaction.   I, Maximino Greenland, MD, have reviewed all documentation for this visit. The documentation on 10/04/21 for the exam, diagnosis, procedures, and orders are all accurate and complete.   THE PATIENT IS ENCOURAGED TO  PRACTICE SOCIAL DISTANCING DUE TO THE COVID-19 PANDEMIC.

## 2021-10-04 NOTE — Patient Instructions (Signed)

## 2021-10-05 ENCOUNTER — Ambulatory Visit
Admission: RE | Admit: 2021-10-05 | Discharge: 2021-10-05 | Disposition: A | Discharge status: Home / Self Care | Payer: Medicare Other | Source: Ambulatory Visit | Attending: Internal Medicine | Admitting: Internal Medicine

## 2021-10-05 DIAGNOSIS — N1831 Chronic kidney disease, stage 3a: Secondary | ICD-10-CM

## 2021-10-05 DIAGNOSIS — N281 Cyst of kidney, acquired: Secondary | ICD-10-CM | POA: Diagnosis not present

## 2021-10-05 DIAGNOSIS — N189 Chronic kidney disease, unspecified: Secondary | ICD-10-CM | POA: Diagnosis not present

## 2021-10-06 LAB — PROTEIN ELECTROPHORESIS, SERUM
A/G Ratio: 1.1 (ref 0.7–1.7)
Albumin ELP: 3.6 g/dL (ref 2.9–4.4)
Alpha 1: 0.3 g/dL (ref 0.0–0.4)
Alpha 2: 1 g/dL (ref 0.4–1.0)
Beta: 1.1 g/dL (ref 0.7–1.3)
Gamma Globulin: 1.1 g/dL (ref 0.4–1.8)
Globulin, Total: 3.4 g/dL (ref 2.2–3.9)

## 2021-10-06 LAB — CMP14+EGFR
ALT: 13 IU/L (ref 0–32)
AST: 17 IU/L (ref 0–40)
Albumin/Globulin Ratio: 1.4 (ref 1.2–2.2)
Albumin: 4.1 g/dL (ref 3.6–4.6)
Alkaline Phosphatase: 81 IU/L (ref 44–121)
BUN/Creatinine Ratio: 13 (ref 12–28)
BUN: 13 mg/dL (ref 10–36)
Bilirubin Total: 0.3 mg/dL (ref 0.0–1.2)
CO2: 24 mmol/L (ref 20–29)
Calcium: 9.6 mg/dL (ref 8.7–10.3)
Chloride: 109 mmol/L — ABNORMAL HIGH (ref 96–106)
Creatinine, Ser: 0.98 mg/dL (ref 0.57–1.00)
Globulin, Total: 2.9 g/dL (ref 1.5–4.5)
Glucose: 96 mg/dL (ref 70–99)
Potassium: 4.4 mmol/L (ref 3.5–5.2)
Sodium: 147 mmol/L — ABNORMAL HIGH (ref 134–144)
Total Protein: 7 g/dL (ref 6.0–8.5)
eGFR: 54 mL/min/{1.73_m2} — ABNORMAL LOW (ref >59)

## 2021-10-06 LAB — LIPID PANEL
Chol/HDL Ratio: 2.2 ratio (ref 0.0–4.4)
Cholesterol, Total: 161 mg/dL (ref 100–199)
HDL: 72 mg/dL (ref >39)
LDL Chol Calc (NIH): 78 mg/dL (ref 0–99)
Triglycerides: 51 mg/dL (ref 0–149)
VLDL Cholesterol Cal: 11 mg/dL (ref 5–40)

## 2021-10-06 LAB — MICROALBUMIN / CREATININE URINE RATIO
Creatinine, Urine: 90.1 mg/dL
Microalb/Creat Ratio: 4 mg/g creat (ref 0–29)
Microalbumin, Urine: 3.9 ug/mL

## 2021-10-06 LAB — PHOSPHORUS: Phosphorus: 2.9 mg/dL — ABNORMAL LOW (ref 3.0–4.3)

## 2021-10-06 LAB — PTH, INTACT AND CALCIUM: PTH: 22 pg/mL (ref 15–65)

## 2021-10-06 LAB — HEMOGLOBIN A1C
Est. average glucose Bld gHb Est-mCnc: 148 mg/dL
Hgb A1c MFr Bld: 6.8 % — ABNORMAL HIGH (ref 4.8–5.6)

## 2021-10-28 ENCOUNTER — Ambulatory Visit (HOSPITAL_COMMUNITY)
Admission: RE | Admit: 2021-10-28 | Discharge: 2021-10-28 | Disposition: A | Discharge status: Home / Self Care | Payer: Medicare Other | Source: Ambulatory Visit | Attending: Internal Medicine | Admitting: Internal Medicine

## 2021-10-28 ENCOUNTER — Encounter (HOSPITAL_COMMUNITY): Payer: Self-pay

## 2021-10-28 VITALS — BP 123/58 | HR 81 | Temp 99.3°F | Resp 16 | Ht 64.0 in | Wt 172.0 lb

## 2021-10-28 DIAGNOSIS — R059 Cough, unspecified: Secondary | ICD-10-CM | POA: Diagnosis not present

## 2021-10-28 DIAGNOSIS — B9789 Other viral agents as the cause of diseases classified elsewhere: Secondary | ICD-10-CM | POA: Diagnosis not present

## 2021-10-28 DIAGNOSIS — R5381 Other malaise: Secondary | ICD-10-CM | POA: Insufficient documentation

## 2021-10-28 DIAGNOSIS — Z20822 Contact with and (suspected) exposure to covid-19: Secondary | ICD-10-CM | POA: Diagnosis not present

## 2021-10-28 DIAGNOSIS — J029 Acute pharyngitis, unspecified: Secondary | ICD-10-CM

## 2021-10-28 DIAGNOSIS — R0981 Nasal congestion: Secondary | ICD-10-CM | POA: Insufficient documentation

## 2021-10-28 DIAGNOSIS — J028 Acute pharyngitis due to other specified organisms: Secondary | ICD-10-CM | POA: Diagnosis not present

## 2021-10-28 LAB — POCT RAPID STREP A, ED / UC
Streptococcus, Group A Screen (Direct): NEGATIVE
Streptococcus, Group A Screen (Direct): NEGATIVE

## 2021-10-28 LAB — RESP PANEL BY RT-PCR (FLU A&B, COVID) ARPGX2
Influenza A by PCR: NEGATIVE
Influenza B by PCR: NEGATIVE
SARS Coronavirus 2 by RT PCR: NEGATIVE

## 2021-10-28 MED ORDER — BENZONATATE 100 MG PO CAPS: 100.0000 mg | ORAL_CAPSULE | Freq: Three times a day (TID) | ORAL | 0 refills | Status: DC | PRN

## 2021-10-28 NOTE — ED Provider Notes (Signed)
[place]    CSN: [id number] Arrival date & time: 10/28/21  1438      History   Chief Complaint Chief Complaint  Patient presents with   Sore Throat    APPT Not feeling well... - Entered by patient   Cough   Nasal Congestion    HPI Leah Wiggins is a 88 y.o. female comes to the urgent care for 1 day history of sore throat, right-sided facial swelling, runny nose and nonproductive cough.  Symptoms started yesterday and has been persistent.  She endorses general malaise.  No nausea, vomiting or diarrhea.  Patient's had similar symptoms, he has been tested for COVID with results pending.  No generalized body aches.  No dizziness, near syncope or syncopal episodes.  Patient denies any headaches.  She is fully vaccinated against COVID-19  .   HPI  Past Medical History:  Diagnosis Date   Arthritis    "shoulders" (05/04/2017)   Breast cancer, right breast ([place]) 1991   GERD (gastroesophageal reflux disease)    Hard of hearing    Hyperlipidemia    Hypertension    Memory loss    Non-stress test nonreactive, 04/24/12, normal 12/10/2012   OSA on CPAP    "don't wear it all the time" (05/04/2017)   PVD (peripheral vascular disease) ([place])    Tobacco use 12/10/2012   Type II diabetes mellitus ([place])     Patient Active Problem List   Diagnosis Date Noted   Hypertensive nephropathy 09/03/2020   Type 2 diabetes mellitus with stage 2 chronic kidney disease, without long-term current use of insulin ([place]) 09/03/2020   Atherosclerosis of aorta ([place]) 09/03/2020   Absolute anemia 09/03/2020   Cellulitis of left foot 09/03/2020   Class 1 obesity due to excess calories with serious comorbidity and body mass index (BMI) of 33.0 to 33.9 in adult 09/03/2020   Abnormal feces 06/04/2020   Abnormal weight loss 06/04/2020   Angiodysplasia of intestine 06/04/2020   Colon cancer screening 06/04/2020   Gastroesophageal reflux disease 06/04/2020   Mixed conductive and sensorineural  hearing loss of left ear with restricted hearing of right ear 06/04/2020   Prolapsed internal hemorrhoids 06/04/2020   Rectal bleeding 06/04/2020   Sensorineural hearing loss (SNHL) of right ear with restricted hearing of left ear 06/04/2020   Mild cognitive impairment 12/23/2019   Memory loss 06/24/2019   Intrinsic atopic dermatitis 09/18/2018   Chronic obstructive pulmonary disease ([place]) 04/06/2018   Prediabetes 02/07/2018   Iron deficiency anemia due to chronic blood loss 12/25/2017   Anaphylaxis 05/04/2017   Rash and nonspecific skin eruption 05/04/2017   History of breast cancer 08/14/2014   Empty sella ([place]) 07/14/2014   Pain in the chest 04/28/2014   Diabetes mellitus without complication ([place]) 95/63/8756   Chest pain 03/03/2014   Essential hypertension 06/24/2013   PAD (peripheral artery disease), abnormal ABIs 12/10/2012   Non-stress test nonreactive, 04/24/12, normal 12/10/2012   Dyslipidemia 12/10/2012   Tobacco use 12/10/2012   Thrombocytopenia ([place]) 01/24/2012   RESTLESS LEG SYNDROME 02/16/2010   METHICILLIN SUSECPTIBLE PNEUMONIA STAPH AUREUS 01/13/2010   HIP THI LEG&ANK ABRASION/FRICION BURN W/O INF 01/13/2010    Past Surgical History:  Procedure Laterality Date   APPENDECTOMY     BIOPSY  01/19/2018   Procedure: BIOPSY;  Surgeon: Carol Ada, MD;  [facility]: [facility];  Service: Endoscopy;;   BREAST BIOPSY Right 1991   CARDIOVASCULAR STRESS TEST  04/24/2012   No significant ST segment change suggestive of ischemia.  CATARACT EXTRACTION, BILATERAL Bilateral    COLONOSCOPY WITH PROPOFOL N/A 01/19/2018   Procedure: COLONOSCOPY WITH PROPOFOL;  Surgeon: Carol Ada, MD;  [facility];  Service: Endoscopy;  Laterality: N/A;   ESOPHAGOGASTRODUODENOSCOPY (EGD) WITH PROPOFOL N/A 01/19/2018   Procedure: ESOPHAGOGASTRODUODENOSCOPY (EGD) WITH PROPOFOL;  Surgeon: Carol Ada, MD;  [facility];  Service: Endoscopy;  Laterality: N/A;   JOINT  REPLACEMENT     LOWER EXTREMITY ARTERIAL DOPPLER Bilateral 05/07/2012   Left ABI-demonstrated mild arterial insufficiency. Right CIA 50-69% diameter reduction. Right SFA less than 50% diameter reduction. Left SFA 70-99% diameter reduction. Bilateral Runoff-Posterior tibial arteries appeared occluded.   MASTECTOMY PARTIAL / LUMPECTOMY W/ AXILLARY LYMPHADENECTOMY Right 1991    TOTAL HIP ARTHROPLASTY Right 2011    OB History   No obstetric history on file.      Home Medications    Prior to Admission medications   Medication Sig Start Date End Date Taking? Authorizing Provider  alendronate (FOSAMAX) 70 MG tablet Take 1 tablet (70 mg total) by mouth once a week. TAKE 1 TABLET EVERY WEDNESDAY WITH A FULL GLASS OF WATER ON AN EMPTY STOMACH Strength: 70 mg 10/04/21  Yes Glendale Chard, MD  aspirin EC 81 MG tablet Take 81 mg by mouth daily.   Yes [provider]  benzonatate (TESSALON) 100 MG capsule Take 1 capsule (100 mg total) by mouth 3 (three) times daily as needed for cough. 10/28/21  Yes Osei Anger, Myrene Galas, MD  FERROUS SULFATE PO Take 2 tablets by mouth daily. gummy   Yes [provider]  Fluocinolone Acetonide (DERMOTIC) 0.01 % OIL Place 3 drops in ear(s) in the morning and at bedtime. 09/18/21  Yes Rising, Wells Guiles, PA-C  metFORMIN (GLUCOPHAGE-XR) 500 MG 24 hr tablet TAKE 1 TABLET TWICE A DAY Patient taking differently: Take 500 mg by mouth in the morning and at bedtime. 04/23/21  Yes Glendale Chard, MD  MICARDIS 40 MG tablet TAKE 1 TABLET DAILY Patient taking differently: Take 40 mg by mouth daily. 02/05/21  Yes Glendale Chard, MD  Multiple Vitamins-Minerals (ALIVE WOMENS 50+ PO) Take 1 tablet by mouth daily.   Yes [provider]  pantoprazole (PROTONIX) 40 MG tablet TAKE 1 TABLET DAILY AT 12 NOON Patient taking differently: Take 40 mg by mouth daily. 04/27/20  Yes Glendale Chard, MD  pravastatin (PRAVACHOL) 40 MG tablet TAKE 1 TABLET DAILY Patient taking  differently: Take 40 mg by mouth daily. 05/03/21  Yes Glendale Chard, MD  albuterol (PROVENTIL) (2.5 MG/3ML) 0.083% nebulizer solution Take 3 mLs (2.5 mg total) by nebulization every 4 (four) hours as needed for wheezing or shortness of breath. 08/06/20 10/04/21  Glendale Chard, MD  ARTIFICIAL TEAR OP Place 1 drop into both eyes 2 (two) times daily as needed (dry eyes).    [provider]  betamethasone dipropionate (DIPROLENE) 0.05 % ointment Apply 1 application. topically 2 (two) times daily as needed (rash). 04/19/19   [provider]  Blood Glucose Monitoring Suppl (FREESTYLE FREEDOM LITE) w/Device KIT Use to check blood sugar 3 times a day. Dx code e11.65 09/18/19   Glendale Chard, MD  clotrimazole-betamethasone (LOTRISONE) cream Apply to affected area 2 times daily prn Patient taking differently: Apply 1 application  topically 2 (two) times daily as needed (irritation on feet). 10/31/15   Janne Napoleon, NP  ferrous sulfate 325 (65 FE) MG EC tablet Take 1 tablet (325 mg total) by mouth 3 (three) times daily with meals. 12/25/17 10/04/21  Henreitta Leber, MD  glucose blood (FREESTYLE LITE) test strip Use to check blood sugar 3 times a day. Dx code e11.65 01/10/20   Glendale Chard, MD  hydrocortisone 2.5 % cream Apply 1 application. topically daily as needed (itching). 09/04/19   [provider]  mometasone (ELOCON) 0.1 % lotion Apply 1 application topically daily as needed (IRRITATION).  04/15/14   [provider]  Spacer/Aero-Holding Chambers (AEROCHAMBER PLUS) inhaler Use as instructed 08/19/19   Glendale Chard, MD  Tiotropium Bromide Monohydrate (SPIRIVA RESPIMAT) 2.5 MCG/ACT AERS Inhale 2 puffs into the lungs daily. 02/06/21   Glendale Chard, MD  triamcinolone (KENALOG) 0.1 % Apply 1 application. topically 2 (two) times daily as needed (irritaton on feet). 02/11/19   [provider]    Family History Family History  Problem Relation Age of Onset   Heart  disease Mother    Stroke Mother    Stroke Father    Cancer Sister        GIST   Cancer Brother        thyroid    Diabetes Son    Stroke Son     Social History Social History   Tobacco Use   Smoking status: Former    Packs/day: 0.50    Years: 70.00    Total pack years: 35.00    Types: Cigarettes    Start date: 03/08/1947    Quit date: 2020    Years since quitting: 3.6   Smokeless tobacco: Never   Tobacco comments:    has tried cold [place]  Vaping Use   Vaping Use: Never used  Substance Use Topics   Alcohol use: No    Alcohol/week: 0.0 standard drinks of alcohol   Drug use: No     Allergies   Bactrim [sulfamethoxazole-trimethoprim] and Penicillins   Review of Systems Review of Systems  Constitutional: Negative.   HENT:  Positive for congestion and sore throat. Negative for postnasal drip, rhinorrhea and trouble swallowing.   Respiratory:  Positive for cough. Negative for chest tightness, shortness of breath and wheezing.   Gastrointestinal: Negative.   Genitourinary: Negative.   Neurological:  Negative for dizziness and headaches.     Physical Exam Triage Vital Signs ED Triage Vitals  Enc Vitals Group     BP 10/28/21 1512 (!) 123/58     Pulse Rate 10/28/21 1512 81     Resp 10/28/21 1512 16     Temp 10/28/21 1512 99.3 F (37.4 C)     Temp Source 10/28/21 1512 Oral     SpO2 10/28/21 1512 91 %     Weight 10/28/21 1516 172 lb (78 kg)     Height 10/28/21 1516 _0  (1.626 m)     Head Circumference --      Peak Flow --      Pain Score 10/28/21 1515 3     Pain Loc --      Pain Edu? --      Excl. in [place]? --    No data found.  Updated Vital Signs BP (!) 123/58 (BP Location: Left Arm)   Pulse 81   Temp 99.3 F (37.4 C) (Oral)   Resp 16   Ht _1  (1.626 m)   Wt 78 kg   SpO2 91%   BMI 29.52 kg/m   Visual Acuity Right Eye Distance:   Left Eye Distance:   Bilateral Distance:    Right Eye Near:   Left Eye Near:    Bilateral Near:  Physical  Exam Vitals and nursing note reviewed.  Constitutional:      General: She is not in acute distress.    Appearance: She is not ill-appearing.  HENT:     Right Ear: Tympanic membrane normal.     Left Ear: Tympanic membrane normal.     Nose: No rhinorrhea.     Mouth/Throat:     Mouth: Mucous membranes are moist.     Pharynx: Posterior oropharyngeal erythema present. No pharyngeal swelling, oropharyngeal exudate or uvula swelling.     Tonsils: 0 on the right. 0 on the left.  Cardiovascular:     Rate and Rhythm: Normal rate and regular rhythm.  Pulmonary:     Effort: Pulmonary effort is normal.     Breath sounds: Normal breath sounds.  Neurological:     Mental Status: She is alert.      UC Treatments / Results  Labs (all labs ordered are listed, but only abnormal results are displayed) Labs Reviewed  RESP PANEL BY RT-PCR (FLU A&B, COVID) [id number]  CULTURE, GROUP A STREP [facility])  POCT RAPID STREP A, ED / UC  POCT RAPID STREP A, ED / UC    EKG   Radiology No results found.  Procedures Procedures (including critical care time)  Medications Ordered in UC Medications - No data to display  Initial Impression / Assessment and Plan / UC Course  I have reviewed the triage vital signs and the nursing notes.  Pertinent labs & imaging results that were available during my care of the patient were reviewed by me and considered in my medical decision making (see chart for details).     1.  Acute viral pharyngitis: COVID-19, flu A/B PCR test has been sent Maintain adequate hydration Tessalon Perles as needed for cough Tylenol as needed for pain and/or fever Point-of-care strep is negative.  Strep cultures have been sent Return precautions given Patient will be called with recommendations if labs are abnormal. Final Clinical Impressions(s) / UC Diagnoses   Final diagnoses:  Acute viral pharyngitis     Discharge Instructions      Increase oral fluid intake Please take  medications as prescribed We will call you with recommendations if labs are abnormal Return to urgent care if you have persistent or worsening symptoms.      ED Prescriptions     Medication Sig Dispense Auth. Provider   benzonatate (TESSALON) 100 MG capsule Take 1 capsule (100 mg total) by mouth 3 (three) times daily as needed for cough. 30 capsule Diksha Tagliaferro, Myrene Galas, MD      PDMP not reviewed this encounter.   Chase Picket, MD 10/31/21 [id number]

## 2021-10-28 NOTE — ED Triage Notes (Signed)
Sore throat and facial swelling since yesterday. States her son was sick first. States son's COVID test was negative.   Patient coughing and having a runny nose as well.

## 2021-10-28 NOTE — Discharge Instructions (Signed)
Increase oral fluid intake Please take medications as prescribed We will call you with recommendations if labs are abnormal Return to urgent care if you have persistent or worsening symptoms.

## 2021-10-31 LAB — CULTURE, GROUP A STREP (THRC)

## 2021-12-13 ENCOUNTER — Ambulatory Visit
Admission: RE | Admit: 2021-12-13 | Discharge: 2021-12-13 | Disposition: A | Discharge status: Home / Self Care | Payer: Medicare Other | Source: Ambulatory Visit | Attending: Internal Medicine | Admitting: Internal Medicine

## 2021-12-13 DIAGNOSIS — R911 Solitary pulmonary nodule: Secondary | ICD-10-CM | POA: Diagnosis not present

## 2021-12-13 DIAGNOSIS — R918 Other nonspecific abnormal finding of lung field: Secondary | ICD-10-CM

## 2021-12-13 DIAGNOSIS — I700 Atherosclerosis of aorta: Secondary | ICD-10-CM | POA: Diagnosis not present

## 2021-12-20 DIAGNOSIS — H52203 Unspecified astigmatism, bilateral: Secondary | ICD-10-CM | POA: Diagnosis not present

## 2021-12-20 DIAGNOSIS — Z961 Presence of intraocular lens: Secondary | ICD-10-CM | POA: Diagnosis not present

## 2021-12-20 DIAGNOSIS — H5202 Hypermetropia, left eye: Secondary | ICD-10-CM | POA: Diagnosis not present

## 2021-12-20 DIAGNOSIS — E119 Type 2 diabetes mellitus without complications: Secondary | ICD-10-CM | POA: Diagnosis not present

## 2021-12-20 DIAGNOSIS — H524 Presbyopia: Secondary | ICD-10-CM | POA: Diagnosis not present

## 2021-12-20 DIAGNOSIS — H40003 Preglaucoma, unspecified, bilateral: Secondary | ICD-10-CM | POA: Diagnosis not present

## 2021-12-20 DIAGNOSIS — H353131 Nonexudative age-related macular degeneration, bilateral, early dry stage: Secondary | ICD-10-CM | POA: Diagnosis not present

## 2021-12-20 DIAGNOSIS — Z7984 Long term (current) use of oral hypoglycemic drugs: Secondary | ICD-10-CM | POA: Diagnosis not present

## 2021-12-20 LAB — HM DIABETES EYE EXAM

## 2021-12-22 DIAGNOSIS — Q179 Congenital malformation of ear, unspecified: Secondary | ICD-10-CM | POA: Diagnosis not present

## 2021-12-22 DIAGNOSIS — H90A32 Mixed conductive and sensorineural hearing loss, unilateral, left ear with restricted hearing on the contralateral side: Secondary | ICD-10-CM | POA: Diagnosis not present

## 2021-12-22 DIAGNOSIS — H608X3 Other otitis externa, bilateral: Secondary | ICD-10-CM | POA: Diagnosis not present

## 2021-12-22 DIAGNOSIS — H90A21 Sensorineural hearing loss, unilateral, right ear, with restricted hearing on the contralateral side: Secondary | ICD-10-CM | POA: Diagnosis not present

## 2021-12-22 DIAGNOSIS — Z974 Presence of external hearing-aid: Secondary | ICD-10-CM | POA: Diagnosis not present

## 2021-12-22 DIAGNOSIS — H6122 Impacted cerumen, left ear: Secondary | ICD-10-CM | POA: Diagnosis not present

## 2021-12-27 ENCOUNTER — Encounter: Payer: Self-pay | Admitting: Podiatry

## 2021-12-27 ENCOUNTER — Encounter: Payer: Self-pay | Admitting: Internal Medicine

## 2021-12-27 ENCOUNTER — Ambulatory Visit (INDEPENDENT_AMBULATORY_CARE_PROVIDER_SITE_OTHER): Payer: Medicare Other | Admitting: Podiatry

## 2021-12-27 ENCOUNTER — Ambulatory Visit (INDEPENDENT_AMBULATORY_CARE_PROVIDER_SITE_OTHER): Payer: Medicare Other | Admitting: Internal Medicine

## 2021-12-27 VITALS — BP 134/76 | HR 72 | Temp 98.0°F | Ht 63.0 in | Wt 167.2 lb

## 2021-12-27 DIAGNOSIS — I739 Peripheral vascular disease, unspecified: Secondary | ICD-10-CM

## 2021-12-27 DIAGNOSIS — E1122 Type 2 diabetes mellitus with diabetic chronic kidney disease: Secondary | ICD-10-CM | POA: Diagnosis not present

## 2021-12-27 DIAGNOSIS — M79609 Pain in unspecified limb: Secondary | ICD-10-CM

## 2021-12-27 DIAGNOSIS — J4489 Other specified chronic obstructive pulmonary disease: Secondary | ICD-10-CM

## 2021-12-27 DIAGNOSIS — J439 Emphysema, unspecified: Secondary | ICD-10-CM | POA: Diagnosis not present

## 2021-12-27 DIAGNOSIS — R918 Other nonspecific abnormal finding of lung field: Secondary | ICD-10-CM | POA: Diagnosis not present

## 2021-12-27 DIAGNOSIS — N182 Chronic kidney disease, stage 2 (mild): Secondary | ICD-10-CM

## 2021-12-27 DIAGNOSIS — B351 Tinea unguium: Secondary | ICD-10-CM

## 2021-12-27 MED ORDER — FORMOTEROL FUMARATE 20 MCG/2ML IN NEBU: 20.0000 ug | INHALATION_SOLUTION | Freq: Two times a day (BID) | RESPIRATORY_TRACT | 11 refills | Status: AC

## 2021-12-27 MED ORDER — BUDESONIDE 0.25 MG/2ML IN SUSP: 0.2500 mg | Freq: Two times a day (BID) | RESPIRATORY_TRACT | 11 refills | Status: DC

## 2021-12-27 NOTE — Progress Notes (Signed)
Leah Wiggins    [id number]    1926-06-11  Primary Care Physician:Sanders, Bailey Mech, MD Date of Appointment: 12/27/2021 Established Patient Visit  Chief complaint:   Chief Complaint  Patient presents with   Follow-up    Cough with chest congestion     HPI: Leah Wiggins is a 88 y.o. woman with history of tobacco use disorder (35 pack years, quit 2020) and dyspnea. Also has SPN.  Interval Updates: Here for follow up for SPN after CT Chest. Shows enlarging LUL mass 2.9cm in size concerning for primary lung malignancy.   Also having worsening chest congestion. No fevers chills night sweats. Daughter is with her - says she was staying with another sibling over the weekend and didn't take inhalers with her.   Occasional shortness of breath. Taking albuterol nebulizer treatments some days 1-2 times/day, some days none.   She has difficulty using the respimat inhaler. Uses albuterol nebs more and prefers those.   She continues to be mostly independent with ADLs but lives with her son. She is able to bathe and dress herself.   I have reviewed the patient's family social and past medical history and updated as appropriate.   Past Medical History:  Diagnosis Date   Arthritis    "shoulders" (05/04/2017)   Breast cancer, right breast ([place]) 1991   GERD (gastroesophageal reflux disease)    Hard of hearing    Hyperlipidemia    Hypertension    Memory loss    Non-stress test nonreactive, 04/24/12, normal 12/10/2012   OSA on CPAP    "don't wear it all the time" (05/04/2017)   PVD (peripheral vascular disease) ([place])    Tobacco use 12/10/2012   Type II diabetes mellitus ([place])     Past Surgical History:  Procedure Laterality Date   APPENDECTOMY     BIOPSY  01/19/2018   Procedure: BIOPSY;  Surgeon: Carol Ada, MD;  [facility];  Service: Endoscopy;;   BREAST BIOPSY Right 1991   CARDIOVASCULAR STRESS TEST  04/24/2012   No significant ST segment change  suggestive of ischemia.   CATARACT EXTRACTION, BILATERAL Bilateral    COLONOSCOPY WITH PROPOFOL N/A 01/19/2018   Procedure: COLONOSCOPY WITH PROPOFOL;  Surgeon: Carol Ada, MD;  [facility];  Service: Endoscopy;  Laterality: N/A;   ESOPHAGOGASTRODUODENOSCOPY (EGD) WITH PROPOFOL N/A 01/19/2018   Procedure: ESOPHAGOGASTRODUODENOSCOPY (EGD) WITH PROPOFOL;  Surgeon: Carol Ada, MD;  [facility];  Service: Endoscopy;  Laterality: N/A;   JOINT REPLACEMENT     LOWER EXTREMITY ARTERIAL DOPPLER Bilateral 05/07/2012   Left ABI-demonstrated mild arterial insufficiency. Right CIA 50-69% diameter reduction. Right SFA less than 50% diameter reduction. Left SFA 70-99% diameter reduction. Bilateral Runoff-Posterior tibial arteries appeared occluded.   MASTECTOMY PARTIAL / LUMPECTOMY W/ AXILLARY LYMPHADENECTOMY Right 1991    TOTAL HIP ARTHROPLASTY Right 2011    Family History  Problem Relation Age of Onset   Heart disease Mother    Stroke Mother    Stroke Father    Cancer Sister        GIST   Cancer Brother        thyroid    Diabetes Son    Stroke Son     Social History   Occupational History   Occupation: retired  Tobacco Use   Smoking status: Former    Packs/day: 0.50    Years: 70.00    Total pack years: 35.00    Types: Cigarettes  Start date: 03/08/1947    Quit date: 2020    Years since quitting: 3.8   Smokeless tobacco: Never   Tobacco comments:    has tried cold [place]  Vaping Use   Vaping Use: Never used  Substance and Sexual Activity   Alcohol use: No    Alcohol/week: 0.0 standard drinks of alcohol   Drug use: No   Sexual activity: Not Currently     Physical Exam: Blood pressure 134/76, pulse 72, temperature 98 F (36.7 C), temperature source Oral, height 5\' 3"  (1.6 m), weight 167 lb 3.2 oz (75.8 kg), SpO2 98 %.  Gen:      No acute distress, well appearing ENT:  no nasal polyps, mucus membranes moist Lungs:    diminished, mild kyphosis, no  wheezes or crackles CV:         Regular rate and rhythm; no murmurs, rubs, or gallops.  No pedal edema   Data Reviewed: Imaging: I have personally reviewed the CT Chest non contrast October 2023 - 2.9 cm LUL mass concerning for primary lung cancer, calcified hilar adenopathy  PFTs:      No data to display          Labs: Lab Results  Component Value Date   WBC 5.9 06/08/2021   HGB 11.3 (L) 06/08/2021   HCT 36.1 06/08/2021   MCV 79.2 (L) 06/08/2021   PLT PLATELET CLUMPS NOTED ON SMEAR, UNABLE TO ESTIMATE 06/08/2021   Lab Results  Component Value Date   NA 147 (H) 10/04/2021   K 4.4 10/04/2021   CL 109 (H) 10/04/2021   CO2 24 10/04/2021     Immunization status: Immunization History  Administered Date(s) Administered   Influenza Whole 02/16/2010   PFIZER(Purple Top)SARS-COV-2 Vaccination 04/29/2019, 05/20/2019, 01/09/2020, 09/22/2020   PNEUMOCOCCAL CONJUGATE-20 01/25/2021   Pneumococcal-Unspecified 06/19/2013   Tdap 01/25/2021    External Records Personally Reviewed:   Assessment:  COPD, not well controlled Lung mass, left upper lobe, concerning for primary lung cancer  Plan/Recommendations: You need to get breathing testing done - PFTs 1 hr, next available. Continue albuterol nebulizer treatments as needed for shortness of breath.  You can stop the spiriva since it's difficult to take. Will switch you to budesonide and formoterol nebulizer treatments twice a day instead.   You have a spot on your lung on the top left side that is concerning for lung cancer.   We discussed risks and benefits of general anesthesia and bronchoscopy. She would like to proceed with diagnosis next steps. She had a colonoscopy/EGD with [facility] in 2019 and reports doing well with this.   You have said that you probably wouldn't want chemotherapy or radiation, but would be interested in surgical resection if that is an option. The next step is proceeding with bronchoscopy to biopsy this  spot on your lung. I will refer you to one of my partners who will do this procedure. We will call you to set this up in the next couple of weeks. You will follow up with that doctor after your procedure and they will help guide if you need to be referred for surgery or refer to a cancer doctor if needed.   I spent 40 minutes on 12/27/2021 in care of this patient including face to face time and non-face to face time spent charting, review of outside records, and coordination of care.   Return to Care: Will call with next steps re bronchoscopy. And follow up.    Lenice Llamas, MD  Pulmonary and [place]

## 2021-12-27 NOTE — Progress Notes (Signed)
This patient returns to my office for at risk foot care.  This patient requires this care by a professional since this patient will be at risk due to having  DM  CKD and thrombocytopenia.  This patient is unable to cut nails herself since the patient cannot reach her nails.These nails are painful walking and wearing shoes.  The patient is accompanied by her daughter. This patient presents for at risk foot care today.  General Appearance  Alert, conversant and in no acute stress.  Vascular  Dorsalis pedis and posterior tibial  pulses are  weakly palpable  bilaterally.  Capillary return is within normal limits  bilaterally. Temperature is within normal limits  bilaterally.  Neurologic  Senn-Weinstein monofilament wire test within normal limits  bilaterally. Muscle power within normal limits bilaterally.  Nails Thick disfigured discolored nails with subungual debris  from hallux to fifth toes bilaterally. No evidence of bacterial infection or drainage bilaterally.  Orthopedic  No limitations of motion  feet .  No crepitus or effusions noted.  No bony pathology or digital deformities noted.  Skin  normotropic skin with no porokeratosis noted bilaterally.  No signs of infections or ulcers noted.     Onychomycosis  Pain in right toes  Pain in left toes  Consent was obtained for treatment procedures.   Mechanical debridement of nails 1-5  bilaterally performed with a nail nipper.  Filed with dremel without incident.    Return office visit    3 months                  Told patient to return for periodic foot care and evaluation due to potential at risk complications.   Gardiner Barefoot DPM

## 2021-12-27 NOTE — Patient Instructions (Addendum)
You need to get breathing testing done - PFTs 1 hr, next available.  You have a spot on your lung on the top left side that is concerning for lung cancer.  You have said that you probably wouldn't want chemotherapy or radiation, but would be interested in surgical resection if that is an option. The next step is proceeding with bronchoscopy to biopsy this spot on your lung. I will refer you to one of my partners who will do this procedure. We will call you to set this up in the next couple of weeks. You will follow up with that doctor after your procedure and they will help guide if you need to be referred for surgery or refer to a cancer doctor if needed.   Continue albuterol nebulizer treatments as needed for shortness of breath.   You can stop the spiriva since it's difficult to take. Will switch you to budesonide and formoterol nebulizer treatments twice a day instead.

## 2021-12-27 NOTE — Progress Notes (Signed)
f °

## 2021-12-28 ENCOUNTER — Ambulatory Visit (INDEPENDENT_AMBULATORY_CARE_PROVIDER_SITE_OTHER): Payer: Medicare Other | Admitting: Internal Medicine

## 2021-12-28 DIAGNOSIS — J439 Emphysema, unspecified: Secondary | ICD-10-CM

## 2021-12-28 DIAGNOSIS — J4489 Other specified chronic obstructive pulmonary disease: Secondary | ICD-10-CM

## 2021-12-28 DIAGNOSIS — R918 Other nonspecific abnormal finding of lung field: Secondary | ICD-10-CM

## 2021-12-28 LAB — PULMONARY FUNCTION TEST
DL/VA: 3.16 ml/min/mmHg/L
DLCO cor: 10.39 ml/min/mmHg
DLCO unc: 10.39 ml/min/mmHg
FEF 25-75 Post: 0.54 L/sec
FEF 25-75 Pre: 0.53 L/sec
FEF2575-%Change-Post: 1 %
FEF2575-%Pred-Post: 81 %
FEF2575-%Pred-Pre: 80 %
FEV1-%Change-Post: 0 %
FEV1-%Pred-Post: 69 %
FEV1-%Pred-Pre: 68 %
FEV1-Post: 0.94 L
FEV1-Pre: 0.93 L
FEV1FVC-%Change-Post: 0 %
FEV1FVC-%Pred-Pre: 91 %
FEV6-%Change-Post: 0 %
FEV6-%Pred-Post: 83 %
FEV6-%Pred-Pre: 82 %
FEV6-Post: 1.44 L
FEV6-Pre: 1.43 L
FEV6FVC-%Pred-Post: 108 %
FEV6FVC-%Pred-Pre: 108 %
FVC-%Change-Post: 0 %
FVC-%Pred-Post: 77 %
FVC-%Pred-Pre: 76 %
FVC-Post: 1.44 L
FVC-Pre: 1.43 L
Post FEV1/FVC ratio: 65 %
Post FEV6/FVC ratio: 100 %
Pre FEV1/FVC ratio: 65 %
Pre FEV6/FVC Ratio: 100 %
RV % pred: 128 %
RV: 3.35 L
TLC % pred: 98 %
TLC: 4.81 L

## 2021-12-28 NOTE — Patient Instructions (Signed)
Full PFT performed today. °

## 2021-12-28 NOTE — Progress Notes (Signed)
Full PFT performed today. °

## 2021-12-29 ENCOUNTER — Telehealth: Payer: Self-pay | Admitting: Internal Medicine

## 2021-12-29 DIAGNOSIS — R918 Other nonspecific abnormal finding of lung field: Secondary | ICD-10-CM

## 2021-12-29 DIAGNOSIS — J439 Emphysema, unspecified: Secondary | ICD-10-CM

## 2021-12-29 DIAGNOSIS — R911 Solitary pulmonary nodule: Secondary | ICD-10-CM

## 2021-12-29 NOTE — Telephone Encounter (Signed)
Discussed case with advanced bronchoscopy colleagues. Current recommendation is for PET/CT and consideration for empiric SBRT. Called patient's daughter Leah Wiggins to discuss these recommendations. Told her we can make an appt in person to discuss what they would want to do, or they can call me and let me know. Will order pet scan.

## 2021-12-29 NOTE — Telephone Encounter (Signed)
Please order Pet CT and schedule her for a 15 minute follow up for lung mass looks like I have some slots Nov 8 Nov 9.   I have placed the order for the scans and the daughter is aware. There is nothing further needed.

## 2021-12-29 NOTE — Addendum Note (Signed)
Addended by: Dessie Coma on: 12/29/2021 03:58 PM   Modules accepted: Orders

## 2022-01-12 ENCOUNTER — Encounter: Payer: Self-pay | Admitting: Internal Medicine

## 2022-01-12 ENCOUNTER — Ambulatory Visit (INDEPENDENT_AMBULATORY_CARE_PROVIDER_SITE_OTHER): Payer: Medicare Other | Admitting: Internal Medicine

## 2022-01-12 VITALS — BP 124/60 | HR 81 | Temp 98.2°F | Ht 62.0 in | Wt 165.2 lb

## 2022-01-12 DIAGNOSIS — J4489 Other specified chronic obstructive pulmonary disease: Secondary | ICD-10-CM

## 2022-01-12 DIAGNOSIS — J439 Emphysema, unspecified: Secondary | ICD-10-CM

## 2022-01-12 DIAGNOSIS — R918 Other nonspecific abnormal finding of lung field: Secondary | ICD-10-CM | POA: Diagnosis not present

## 2022-01-12 NOTE — Patient Instructions (Addendum)
Please schedule follow up scheduled with APP in 3 months.    Your breathing testing shows moderate COPD.  Continue nebulizer treatments with budesonide and formoterol twice a day. You can take albuterol as needed in between.  Follow through with PET scan that is scheduled for 01/20/22.  I will refer you to radiation oncology doctors for consideration of empiric radiation. Due to your advanced age I have discussed the case with our cancer care team and we feel this is the best possible option moving forward.

## 2022-01-12 NOTE — Progress Notes (Signed)
Leah Wiggins    [id number]    [birth year]  Primary Care Physician:Sanders, Bailey Mech, MD Date of Appointment: 01/12/2022 Established Patient Visit  Chief complaint:   Chief Complaint  Patient presents with   Follow-up    Review CT scan from 12/13/2021.  No sx noted today. COPD     HPI: Leah Wiggins is a 88 y.o. woman with history of tobacco use disorder (35 pack years, quit 2020) and dyspnea. Also has SPN. Awaiting pet scan.   Interval Updates: Here for follow up for LUL mass 2.9cm in size concerning for primary lung malignancy. Has pet scan ordered but is scheduled for next week.   Discussed case with advanced bronchoscopy colleagues. Not a candidate for surgical resection given advanced age. Most likely recommendation is empiric SBRT consideration.  Occasional shortness of breath. Taking albuterol nebulizer treatments some days 1-2 times/day, some days none.   She has difficulty using the respimat inhaler. Uses albuterol nebs more and prefers those.   She continues to be mostly independent with ADLs but lives with her son. She is able to bathe and dress herself.   I have reviewed the patient's family social and past medical history and updated as appropriate.   Past Medical History:  Diagnosis Date   Arthritis    "shoulders" (05/04/2017)   Breast cancer, right breast ([place]) 1991   GERD (gastroesophageal reflux disease)    Hard of hearing    Hyperlipidemia    Hypertension    Memory loss    Non-stress test nonreactive, 04/24/12, normal 12/10/2012   OSA on CPAP    "don't wear it all the time" (05/04/2017)   PVD (peripheral vascular disease) ([place])    Tobacco use 12/10/2012   Type II diabetes mellitus ([place])     Past Surgical History:  Procedure Laterality Date   APPENDECTOMY     BIOPSY  01/19/2018   Procedure: BIOPSY;  Surgeon: Carol Ada, MD;  Location: [facility];  Service: Endoscopy;;   BREAST BIOPSY Right 1991   CARDIOVASCULAR STRESS TEST   04/24/2012   No significant ST segment change suggestive of ischemia.   CATARACT EXTRACTION, BILATERAL Bilateral    COLONOSCOPY WITH PROPOFOL N/A 01/19/2018   Procedure: COLONOSCOPY WITH PROPOFOL;  Surgeon: Carol Ada, MD;  Location: [facility];  Service: Endoscopy;  Laterality: N/A;   ESOPHAGOGASTRODUODENOSCOPY (EGD) WITH PROPOFOL N/A 01/19/2018   Procedure: ESOPHAGOGASTRODUODENOSCOPY (EGD) WITH PROPOFOL;  Surgeon: Carol Ada, MD;  Location: [facility];  Service: Endoscopy;  Laterality: N/A;   JOINT REPLACEMENT     LOWER EXTREMITY ARTERIAL DOPPLER Bilateral 05/07/2012   Left ABI-demonstrated mild arterial insufficiency. Right CIA 50-69% diameter reduction. Right SFA less than 50% diameter reduction. Left SFA 70-99% diameter reduction. Bilateral Runoff-Posterior tibial arteries appeared occluded.   MASTECTOMY PARTIAL / LUMPECTOMY W/ AXILLARY LYMPHADENECTOMY Right 1991    TOTAL HIP ARTHROPLASTY Right 2011    Family History  Problem Relation Age of Onset   Heart disease Mother    Stroke Mother    Stroke Father    Cancer Sister        GIST   Cancer Brother        thyroid    Diabetes Son    Stroke Son     Social History   Occupational History   Occupation: retired  Tobacco Use   Smoking status: Former    Packs/day: 0.50    Years: 70.00    Total pack years: 35.00  Types: Cigarettes    Start date: 03/08/1947    Quit date: 2020    Years since quitting: 3.8   Smokeless tobacco: Never   Tobacco comments:    has tried cold [place]  Vaping Use   Vaping Use: Never used  Substance and Sexual Activity   Alcohol use: No    Alcohol/week: 0.0 standard drinks of alcohol   Drug use: No   Sexual activity: Not Currently     Physical Exam: Blood pressure 124/60, pulse 81, temperature 98.2 F (36.8 C), temperature source Oral, height 5\' 2"  (1.575 m), weight 165 lb 3.2 oz (74.9 kg), SpO2 96 %.  Gen:      No acute distress, well appearing ENT:  no nasal polyps, mucus membranes  moist Lungs:    diminished, mild kyphosis, no wheezes or crackles CV:         RRR no mrg   Data Reviewed: Imaging: I have personally reviewed the CT Chest non contrast October 2023 - 2.9 cm LUL mass concerning for primary lung cancer, calcified hilar adenopathy  PFTs:     Latest Ref Rng & Units 12/28/2021   10:56 AM  PFT Results  FVC-Pre L 1.43   FVC-Predicted Pre % 76   FVC-Post L 1.44   FVC-Predicted Post % 77   Pre FEV1/FVC % % 65   Post FEV1/FCV % % 65   FEV1-Pre L 0.93   FEV1-Predicted Pre % 68   FEV1-Post L 0.94   DLCO uncorrected ml/min/mmHg 10.39   DLCO corrected ml/min/mmHg 10.39   TLC L 4.81   TLC % Predicted % 98   RV % Predicted % 128     Labs: Lab Results  Component Value Date   WBC 5.9 06/08/2021   HGB 11.3 (L) 06/08/2021   HCT 36.1 06/08/2021   MCV 79.2 (L) 06/08/2021   PLT PLATELET CLUMPS NOTED ON SMEAR, UNABLE TO ESTIMATE 06/08/2021   Lab Results  Component Value Date   NA 147 (H) 10/04/2021   K 4.4 10/04/2021   CL 109 (H) 10/04/2021   CO2 24 10/04/2021     Immunization status: Immunization History  Administered Date(s) Administered   Influenza Whole 02/16/2010   PFIZER(Purple Top)SARS-COV-2 Vaccination 04/29/2019, 05/20/2019, 01/09/2020, 09/22/2020   PNEUMOCOCCAL CONJUGATE-20 01/25/2021   Pneumococcal-Unspecified 06/19/2013   Tdap 01/25/2021    External Records Personally Reviewed:   Assessment:  Moderate COPD, FEV1 68% of predicted Lung mass, left upper lobe, concerning for primary lung cancer  Plan/Recommendations:  Your breathing testing shows moderate COPD.  Continue nebulizer treatments with budesonide and formoterol twice a day. You can take albuterol as needed in between.  Follow through with PET scan that is scheduled for 01/20/22.  I will refer you to radiation oncology doctors for consideration of empiric radiation. Due to your advanced age I have discussed the case with our cancer care team and we feel this is the  best possible option moving forward.    Return to Care: Return in about 3 months (around 04/14/2022).    Lenice Llamas, MD Pulmonary and [facility]

## 2022-01-19 NOTE — Progress Notes (Signed)
Thoracic Location of Tumor / Histology: LUL mass 2.9cm in size concerning for primary lung malignancy.   Patient presented : Chief complaint:       Chief Complaint  Patient presents with   Follow-up      Cough with chest congestion     Dr. Shearon Stalls on 01-12-22 Chief complaint:       Chief Complaint  Patient presents with   Follow-up      Review CT scan from 12/13/2021.  No sx noted today. COPD    Dr. Shearon Stalls 12-27-21  Interval Updates: Here for follow up for SPN after CT Chest. Shows enlarging LUL mass 2.9cm in size concerning for primary lung malignancy.    Also having worsening chest congestion. No fevers chills night sweats. Daughter is with her - says she was staying with another sibling over the weekend and didn't take inhalers with her.    Occasional shortness of breath. Taking albuterol nebulizer treatments some days 1-2 times/day, some days none.    She has difficulty using the respimat inhaler. Uses albuterol nebs more and prefers those.    She continues to be mostly independent with ADLs but lives with her son. She is able to bathe and dress herself.    I have reviewed the patient's family social and past medical history and updated as appropriate.    HPI: Leah Wiggins is a 88 y.o. woman with history of tobacco use disorder (35 pack years, quit 2020) and dyspnea. Also has SPN. Awaiting pet scan.    Interval Updates: Here for follow up for LUL mass 2.9cm in size concerning for primary lung malignancy. Has pet scan ordered but is scheduled for next week.    Discussed case with advanced bronchoscopy colleagues. Not a candidate for surgical resection given advanced age. Most likely recommendation is empiric SBRT consideration.  Occasional shortness of breath. Taking albuterol nebulizer treatments some days 1-2 times/day, some days none.    She has difficulty using the respimat inhaler. Uses albuterol nebs more and prefers those.    She continues to be mostly  independent with ADLs but lives with her son. She is able to bathe and dress herself.    I have reviewed the patient's family social and past medical history and updated as appropriate.        Past Medical History:  Diagnosis Date   Arthritis      "shoulders" (05/04/2017)   Breast cancer, right breast ([place]) 1991   GERD (gastroesophageal reflux disease)     Hard of hearing     Hyperlipidemia     Hypertension     Memory loss     Non-stress test nonreactive, 04/24/12, normal 12/10/2012   OSA on CPAP      "don't wear it all the time" (05/04/2017)   PVD (peripheral vascular disease) ([place])     Tobacco use 12/10/2012   Type II diabetes mellitus ([place])             Past Surgical History:  Procedure Laterality Date   APPENDECTOMY       BIOPSY   01/19/2018    Procedure: BIOPSY;  Surgeon: Carol Ada, MD;  [facility];  Service: Endoscopy;;   BREAST BIOPSY Right 1991   CARDIOVASCULAR STRESS TEST   04/24/2012    No significant ST segment change suggestive of ischemia.   CATARACT EXTRACTION, BILATERAL Bilateral     COLONOSCOPY WITH PROPOFOL N/A 01/19/2018    Procedure: COLONOSCOPY WITH PROPOFOL;  Surgeon: Carol Ada, MD;  [facility];  Service: Endoscopy;  Laterality: N/A;   ESOPHAGOGASTRODUODENOSCOPY (EGD) WITH PROPOFOL N/A 01/19/2018    Procedure: ESOPHAGOGASTRODUODENOSCOPY (EGD) WITH PROPOFOL;  Surgeon: Carol Ada, MD;  [facility];  Service: Endoscopy;  Laterality: N/A;   JOINT REPLACEMENT       LOWER EXTREMITY ARTERIAL DOPPLER Bilateral 05/07/2012    Left ABI-demonstrated mild arterial insufficiency. Right CIA 50-69% diameter reduction. Right SFA less than 50% diameter reduction. Left SFA 70-99% diameter reduction. Bilateral Runoff-Posterior tibial arteries appeared occluded.   MASTECTOMY PARTIAL / LUMPECTOMY W/ AXILLARY LYMPHADENECTOMY Right 1991    TOTAL HIP ARTHROPLASTY Right 2011   Assessment:  Moderate COPD, FEV1 68% of predicted Lung mass,  left upper lobe, concerning for primary lung cancer   Plan/Recommendations:   Your breathing testing shows moderate COPD.  Continue nebulizer treatments with budesonide and formoterol twice a day. You can take albuterol as needed in between.  Follow through with PET scan that is scheduled for 01/20/22.  I will refer you to radiation oncology doctors for consideration of empiric radiation. Due to your advanced age I have discussed the case with our cancer care team and we feel this is the best possible option moving forward.     Return to Care: Return in about 3 months (around 04/14/2022).    Lenice Llamas, MD Pulmonary and [place]  Biopsies of(if applicable) revealed: No biopsy will be performed due to age. Will have Pet scan next 01-26-22.  Tobacco/Marijuana/Snuff/ETOH use: yes, smoker for 35 years  Past/Anticipated interventions by cardiothoracic surgery, if any: none at this time  Past/Anticipated interventions by medical oncology, if any: none at this time  Signs/Symptoms Weight changes, if any: no Respiratory complaints, if any: no more than usual Hemoptysis, if any: no Pain issues, if any:  no  SAFETY ISSUES: Prior radiation? no Pacemaker/ICD? no Possible current pregnancy?no Is the patient on methotrexate? no  Current Complaints / other details:   Wants to know about cancer diagnosis and next steps.   Vitals:   01/25/22 1226  BP: (!) 141/106  Pulse: 80  Resp: 15  Temp: 98.5 F (36.9 C)  SpO2: 98%

## 2022-01-20 ENCOUNTER — Inpatient Hospital Stay (HOSPITAL_COMMUNITY): Admission: RE | Admit: 2022-01-20 | Payer: Medicare Other | Source: Ambulatory Visit

## 2022-01-20 ENCOUNTER — Other Ambulatory Visit (HOSPITAL_COMMUNITY): Payer: Medicare Other

## 2022-01-24 NOTE — Progress Notes (Signed)
Radiation Oncology         [phone] ________________________________  Initial Outpatient Consultation  Name: Leah Wiggins MRN: [id number]  Date: 01/25/2022  DOB: Jul 16, 1926  VE:LFYBOFB, Leah Mech, MD  Spero Geralds, MD   REFERRING PHYSICIAN: Spero Geralds, MD  DIAGNOSIS:    ICD-10-CM   1. Squamous carcinoma of lung, left (HCC)  C34.92      Enlarging left upper lobe pulmonary nodule concerning for malignancy (non-biopsy proven)   Cancer Staging  No matching staging information was found for the patient.  CHIEF COMPLAINT: Here to discuss management of left lung cancer  HISTORY OF PRESENT ILLNESS::Leah Wiggins is a 88 y.o. female who initially presented to the ED on 06/08/21 with new onset of left sided chest pain and worsening SOB (she has SOB at baseline). Chest x-ray performed in the ED showed no acute abnormalities. However, CTA of the chest (to rule out PE) showed clustered nodular densities in the left upper lobe, though to likely reflect small airways disease or mucous plugging at the time. The largest nodule in the LUL was appreciated to measure 9 mm. No evidence of PE was appreciated.   Given her symptoms, the patient accordingly established care with Dr. Shearon Stalls at [facility] on 07/02/21. During this visit, the patient reported her SOB to be most prominent right before bed and right when she wakes up in the morning. She also reported taking spiriva and prn albuterol to manage this per her PCP. Moving forward, Dr. Shearon Stalls recommended proceeding with a follow-up chest CT in 6 months, and for her to continue using spiriva and albuterol PRN.   Follow-up chest CT on 12/13/21 showed interval enlargement of larger LUL nodule, measuring 2.9 x 2.5 cm (previously 0.9 cm), concerning for primary lung cancer vs solitary metastatic disease. CT also showed calcified granulomas in the lungs with calcified hilar and mediastinal lymph nodes.  Given her advanced age, Dr.  Shearon Stalls does not recommend surgical intervention. Given so, the patient was referred to be for consideration of empiric SBRT to the LUL nodule which we will discuss in detail today.   Dr. Shearon Stalls has scheduled her for a PET scan on 01/26/22.  Smoking history: 35 pack years, quit in 2020   ETOH use: does not drink   Previous cancers: right breast cancer diagnosed in 1991 s/p lumpectomy with axillary lymphadenectomy (under Dr. Kathrin Penner) and tamoxifen x 5 years. She did not received any radiation. She was also previously followed by oncology for mild thrombocytopenia.   Of note: The patient is also followed by ENT for left ear pain due to bilateral impacted cerumen and left greater than right TMJ. The patient was also recently seen in the ED on 10/28/21 for evaluation of sore throat, right-sided facial swelling, runny nose and nonproductive cough x 1 day. Strep and COVID tests were negative, and she was discharged home with tessalon perles to manage her cough (discharge diagnosis: acute viral pharyngitis).     Today she reports to be doing well overall. She does notice that her breathing is worse at night, but she has been experiencing this for quite some time. She states her breathing is good throughout the day and is not on any oxygen. She does experience a chronic cough but denies any hemoptysis, unintentional weight loss, or fevers. She is present with her supportive daughter today.   PREVIOUS RADIATION THERAPY: No  PAST MEDICAL HISTORY:  has a past medical history of Arthritis, Breast cancer, right breast (  [place]) (1991), GERD (gastroesophageal reflux disease), Hard of hearing, Hyperlipidemia, Hypertension, Memory loss, Non-stress test nonreactive, 04/24/12, normal (12/10/2012), OSA on CPAP, PVD (peripheral vascular disease) ([place]), Tobacco use (12/10/2012), and Type II diabetes mellitus ([place]).    PAST SURGICAL HISTORY: Past Surgical History:  Procedure Laterality Date   APPENDECTOMY     BIOPSY   01/19/2018   Procedure: BIOPSY;  Surgeon: Carol Ada, MD;  Location: [facility];  Service: Endoscopy;;   BREAST BIOPSY Right 1991   CARDIOVASCULAR STRESS TEST  04/24/2012   No significant ST segment change suggestive of ischemia.   CATARACT EXTRACTION, BILATERAL Bilateral    COLONOSCOPY WITH PROPOFOL N/A 01/19/2018   Procedure: COLONOSCOPY WITH PROPOFOL;  Surgeon: Carol Ada, MD;  Location: [facility];  Service: Endoscopy;  Laterality: N/A;   ESOPHAGOGASTRODUODENOSCOPY (EGD) WITH PROPOFOL N/A 01/19/2018   Procedure: ESOPHAGOGASTRODUODENOSCOPY (EGD) WITH PROPOFOL;  Surgeon: Carol Ada, MD;  Location: [facility];  Service: Endoscopy;  Laterality: N/A;   JOINT REPLACEMENT     LOWER EXTREMITY ARTERIAL DOPPLER Bilateral 05/07/2012   Left ABI-demonstrated mild arterial insufficiency. Right CIA 50-69% diameter reduction. Right SFA less than 50% diameter reduction. Left SFA 70-99% diameter reduction. Bilateral Runoff-Posterior tibial arteries appeared occluded.   MASTECTOMY PARTIAL / LUMPECTOMY W/ AXILLARY LYMPHADENECTOMY Right 1991    TOTAL HIP ARTHROPLASTY Right 2011    FAMILY HISTORY: family history includes Cancer in her brother and sister; Diabetes in her son; Heart disease in her mother; Stroke in her father, mother, and son.  SOCIAL HISTORY:  reports that she quit smoking about 3 years ago. Her smoking use included cigarettes. She started smoking about 74 years ago. She has a 35.00 pack-year smoking history. She has never used smokeless tobacco. She reports that she does not drink alcohol and does not use drugs.  ALLERGIES: Bactrim [sulfamethoxazole-trimethoprim] and Penicillins  MEDICATIONS:  Current Outpatient Medications  Medication Sig Dispense Refill   albuterol (PROVENTIL) (2.5 MG/3ML) 0.083% nebulizer solution Take 3 mLs (2.5 mg total) by nebulization every 4 (four) hours as needed for wheezing or shortness of breath. 75 mL 2   alendronate (FOSAMAX) 70 MG tablet Take 1  tablet (70 mg total) by mouth once a week. TAKE 1 TABLET EVERY WEDNESDAY WITH A FULL GLASS OF WATER ON AN EMPTY STOMACH Strength: 70 mg 12 tablet 3   ARTIFICIAL TEAR OP Place 1 drop into both eyes 2 (two) times daily as needed (dry eyes).     aspirin EC 81 MG tablet Take 81 mg by mouth daily.     betamethasone dipropionate (DIPROLENE) 0.05 % ointment Apply 1 application. topically 2 (two) times daily as needed (rash).     Blood Glucose Monitoring Suppl (FREESTYLE FREEDOM LITE) w/Device KIT Use to check blood sugar 3 times a day. Dx code e11.65 1 kit 3   budesonide (PULMICORT) 0.25 MG/2ML nebulizer solution Take 2 mLs (0.25 mg total) by nebulization in the morning and at bedtime. 60 mL 11   clotrimazole-betamethasone (LOTRISONE) cream Apply to affected area 2 times daily prn (Patient taking differently: Apply 1 application  topically 2 (two) times daily as needed (irritation on feet).) 30 g 0   ferrous sulfate 325 (65 FE) MG EC tablet Take 1 tablet (325 mg total) by mouth 3 (three) times daily with meals. 90 tablet 2   FERROUS SULFATE PO Take 2 tablets by mouth daily. gummy     Fluocinolone Acetonide (DERMOTIC) 0.01 % OIL Place 3 drops in ear(s) in the morning and at bedtime. 20 mL  0   formoterol (PERFOROMIST) 20 MCG/2ML nebulizer solution Take 2 mLs (20 mcg total) by nebulization 2 (two) times daily. (Patient not taking: Reported on 01/12/2022) 60 mL 11   glucose blood (FREESTYLE LITE) test strip Use to check blood sugar 3 times a day. Dx code e11.65 150 each 3   hydrocortisone 2.5 % cream Apply 1 application. topically daily as needed (itching).     metFORMIN (GLUCOPHAGE-XR) 500 MG 24 hr tablet TAKE 1 TABLET TWICE A DAY (Patient taking differently: Take 500 mg by mouth in the morning and at bedtime.) 180 tablet 3   MICARDIS 40 MG tablet TAKE 1 TABLET DAILY (Patient taking differently: Take 40 mg by mouth daily.) 90 tablet 3   mometasone (ELOCON) 0.1 % lotion Apply 1 application topically daily as  needed (IRRITATION).   0   Multiple Vitamins-Minerals (ALIVE WOMENS 50+ PO) Take 1 tablet by mouth daily.     pantoprazole (PROTONIX) 40 MG tablet TAKE 1 TABLET DAILY AT 12 NOON (Patient taking differently: Take 40 mg by mouth daily.) 90 tablet 3   pravastatin (PRAVACHOL) 40 MG tablet TAKE 1 TABLET DAILY (Patient taking differently: Take 40 mg by mouth daily.) 90 tablet 3   Spacer/Aero-Holding Chambers (AEROCHAMBER PLUS) inhaler Use as instructed 1 each 2   triamcinolone (KENALOG) 0.1 % Apply 1 application. topically 2 (two) times daily as needed (irritaton on feet).     No current facility-administered medications for this encounter.    REVIEW OF SYSTEMS:  Notable for that above.   PHYSICAL EXAM:  vitals were not taken for this visit.   General: Alert and oriented, in no acute distress  HEENT: Head is normocephalic. Extraocular movements are intact. Oropharynx is clear. Neck: Neck is supple, no palpable cervical or supraclavicular lymphadenopathy. Heart: Regular in rate and rhythm with no murmurs, rubs, or gallops. Chest: Clear to auscultation bilaterally, with no rhonchi, wheezes, or rales. Abdomen: Soft, nontender, nondistended, with no rigidity or guarding. Extremities: No cyanosis or edema. Lymphatics: see Neck Exam Skin: No concerning lesions. Musculoskeletal: symmetric strength and muscle tone throughout. Neurologic: Cranial nerves II through XII are grossly intact. No obvious focalities. Speech is fluent. Coordination is intact. Psychiatric: Judgment and insight are intact. Affect is appropriate.   ECOG = 1  0 - Asymptomatic (Fully active, able to carry on all predisease activities without restriction)  1 - Symptomatic but completely ambulatory (Restricted in physically strenuous activity but ambulatory and able to carry out work of a light or sedentary nature. For example, light housework, office work)  2 - Symptomatic, <50% in bed during the day (Ambulatory and capable of  all self care but unable to carry out any work activities. Up and about more than 50% of waking hours)  3 - Symptomatic, >50% in bed, but not bedbound (Capable of only limited self-care, confined to bed or chair 50% or more of waking hours)  4 - Bedbound (Completely disabled. Cannot carry on any self-care. Totally confined to bed or chair)  5 - Death   Eustace Pen MM, Creech RH, Tormey DC, et al. [phone]). "Toxicity and response criteria of the [employer] Group". [place] Oncol. 5 (6): 649-55   LABORATORY DATA:  Lab Results  Component Value Date   WBC 5.9 06/08/2021   HGB 11.3 (L) 06/08/2021   HCT 36.1 06/08/2021   MCV 79.2 (L) 06/08/2021   PLT PLATELET CLUMPS NOTED ON SMEAR, UNABLE TO ESTIMATE 06/08/2021   CMP     Component Value  Date/Time   NA 147 (H) 10/04/2021 1251   NA 142 08/14/2014 0916   K 4.4 10/04/2021 1251   K 4.1 08/14/2014 0916   CL 109 (H) 10/04/2021 1251   CL 110 (H) 07/25/2012 0849   CO2 24 10/04/2021 1251   CO2 26 08/14/2014 0916   GLUCOSE 96 10/04/2021 1251   GLUCOSE 149 (H) 06/08/2021 1757   GLUCOSE 141 (H) 08/14/2014 0916   GLUCOSE 99 07/25/2012 0849   BUN 13 10/04/2021 1251   BUN 18.0 08/14/2014 0916   CREATININE 0.98 10/04/2021 1251   CREATININE 1.0 08/14/2014 0916   CALCIUM 9.6 10/04/2021 1251   CALCIUM 9.2 08/14/2014 0916   PROT 7.0 10/04/2021 1251   PROT 6.8 08/14/2014 0916   ALBUMIN 4.1 10/04/2021 1251   ALBUMIN 3.4 (L) 08/14/2014 0916   AST 17 10/04/2021 1251   AST 18 08/14/2014 0916   ALT 13 10/04/2021 1251   ALT 11 08/14/2014 0916   ALKPHOS 81 10/04/2021 1251   ALKPHOS 79 08/14/2014 0916   BILITOT 0.3 10/04/2021 1251   BILITOT 0.28 08/14/2014 0916   GFRNONAA 49 (L) 06/08/2021 1757   GFRAA 64 01/08/2020 1614         RADIOGRAPHY: No results found.    IMPRESSION/PLAN: Enlarging left upper lobe pulmonary nodule concerning for malignancy (non-biopsy proven)  Patient is a lovely 88 year old with an enlarging left upper  lobe pulmonary nodule that is concerning for cancer based off of imaging and rate of growth. I explained the standard of treatment which is to obtain a biopsy prior to treatment. Given the patient's age, a biopsy introduces many risks that outweigh the benefits of a biopsy. Dr. Shearon Stalls does not think she is a surgical candidate due to her age, so I am recommending SBRT radiation therapy at this time. This is a non-invasive, generally well tolerated option to stop the growth of the mass.   Today we discussed SBRT in detail. Treatment would be given every other day for 3 fractions. The goal of treatment would be curative, but no guarantees of treatment were given. The risks of treatment include rib fragility, fatigue, injury to the lung, pneumonitis, or other intrathoracic issues.   All of the patient and her daughter's questions were answered to their satisfaction. I personally shared imaging with the patient today. She would like to proceed with treatment. PET scan scheduled for tomorrow will determine the fields and fractionation of treatment.    On date of service, in total, I spent 45 minutes on this encounter. Patient was seen in person. _______________________________________   Leona Singleton, PA    Eppie Gibson, MD  This document serves as a record of services personally performed by Eppie Gibson, MD. It was created on her behalf by Roney Mans, a trained medical scribe. The creation of this record is based on the scribe's personal observations and the provider's statements to them. This document has been checked and approved by the attending provider.

## 2022-01-25 ENCOUNTER — Ambulatory Visit
Admission: RE | Admit: 2022-01-25 | Discharge: 2022-01-25 | Disposition: A | Discharge status: Home / Self Care | Payer: Medicare Other | Source: Ambulatory Visit | Attending: Radiation Oncology | Admitting: Radiation Oncology

## 2022-01-25 ENCOUNTER — Telehealth: Payer: Self-pay

## 2022-01-25 ENCOUNTER — Encounter: Payer: Self-pay | Admitting: Radiation Oncology

## 2022-01-25 ENCOUNTER — Other Ambulatory Visit: Payer: Self-pay

## 2022-01-25 VITALS — BP 141/106 | HR 80 | Temp 98.5°F | Resp 15 | Wt 167.4 lb

## 2022-01-25 DIAGNOSIS — M129 Arthropathy, unspecified: Secondary | ICD-10-CM | POA: Diagnosis not present

## 2022-01-25 DIAGNOSIS — E785 Hyperlipidemia, unspecified: Secondary | ICD-10-CM | POA: Insufficient documentation

## 2022-01-25 DIAGNOSIS — Z7984 Long term (current) use of oral hypoglycemic drugs: Secondary | ICD-10-CM | POA: Insufficient documentation

## 2022-01-25 DIAGNOSIS — Z7951 Long term (current) use of inhaled steroids: Secondary | ICD-10-CM | POA: Diagnosis not present

## 2022-01-25 DIAGNOSIS — Z87891 Personal history of nicotine dependence: Secondary | ICD-10-CM | POA: Diagnosis not present

## 2022-01-25 DIAGNOSIS — I10 Essential (primary) hypertension: Secondary | ICD-10-CM | POA: Insufficient documentation

## 2022-01-25 DIAGNOSIS — R0602 Shortness of breath: Secondary | ICD-10-CM | POA: Diagnosis not present

## 2022-01-25 DIAGNOSIS — C3412 Malignant neoplasm of upper lobe, left bronchus or lung: Secondary | ICD-10-CM | POA: Insufficient documentation

## 2022-01-25 DIAGNOSIS — Z809 Family history of malignant neoplasm, unspecified: Secondary | ICD-10-CM | POA: Insufficient documentation

## 2022-01-25 DIAGNOSIS — C3492 Malignant neoplasm of unspecified part of left bronchus or lung: Secondary | ICD-10-CM

## 2022-01-25 DIAGNOSIS — Z853 Personal history of malignant neoplasm of breast: Secondary | ICD-10-CM | POA: Insufficient documentation

## 2022-01-25 DIAGNOSIS — Z9011 Acquired absence of right breast and nipple: Secondary | ICD-10-CM | POA: Insufficient documentation

## 2022-01-25 DIAGNOSIS — R918 Other nonspecific abnormal finding of lung field: Secondary | ICD-10-CM

## 2022-01-25 DIAGNOSIS — E119 Type 2 diabetes mellitus without complications: Secondary | ICD-10-CM | POA: Diagnosis not present

## 2022-01-25 DIAGNOSIS — K219 Gastro-esophageal reflux disease without esophagitis: Secondary | ICD-10-CM | POA: Insufficient documentation

## 2022-01-25 DIAGNOSIS — I739 Peripheral vascular disease, unspecified: Secondary | ICD-10-CM | POA: Diagnosis not present

## 2022-01-25 DIAGNOSIS — Z79899 Other long term (current) drug therapy: Secondary | ICD-10-CM | POA: Diagnosis not present

## 2022-01-26 ENCOUNTER — Encounter (HOSPITAL_COMMUNITY)
Admission: RE | Admit: 2022-01-26 | Discharge: 2022-01-26 | Disposition: A | Discharge status: Home / Self Care | Payer: Medicare Other | Source: Ambulatory Visit | Attending: Internal Medicine | Admitting: Internal Medicine

## 2022-01-26 ENCOUNTER — Other Ambulatory Visit (HOSPITAL_COMMUNITY): Payer: Medicare Other

## 2022-01-26 DIAGNOSIS — J4489 Other specified chronic obstructive pulmonary disease: Secondary | ICD-10-CM | POA: Diagnosis not present

## 2022-01-26 DIAGNOSIS — R918 Other nonspecific abnormal finding of lung field: Secondary | ICD-10-CM | POA: Insufficient documentation

## 2022-01-26 DIAGNOSIS — R911 Solitary pulmonary nodule: Secondary | ICD-10-CM | POA: Insufficient documentation

## 2022-01-26 DIAGNOSIS — C3412 Malignant neoplasm of upper lobe, left bronchus or lung: Secondary | ICD-10-CM | POA: Insufficient documentation

## 2022-01-26 DIAGNOSIS — J439 Emphysema, unspecified: Secondary | ICD-10-CM | POA: Insufficient documentation

## 2022-01-26 LAB — GLUCOSE, CAPILLARY: Glucose-Capillary: 148 mg/dL — ABNORMAL HIGH (ref 70–99)

## 2022-01-26 MED ORDER — FLUDEOXYGLUCOSE F - 18 (FDG) INJECTION
8.2000 | Freq: Once | INTRAVENOUS | Status: AC
  Administered 2022-01-26: 8.2 via INTRAVENOUS

## 2022-02-08 ENCOUNTER — Encounter: Payer: Self-pay | Admitting: Internal Medicine

## 2022-02-08 ENCOUNTER — Ambulatory Visit (INDEPENDENT_AMBULATORY_CARE_PROVIDER_SITE_OTHER): Payer: Medicare Other | Admitting: Internal Medicine

## 2022-02-08 VITALS — BP 136/70 | HR 85 | Temp 98.3°F | Ht 62.0 in | Wt 162.8 lb

## 2022-02-08 DIAGNOSIS — R413 Other amnesia: Secondary | ICD-10-CM

## 2022-02-08 DIAGNOSIS — R269 Unspecified abnormalities of gait and mobility: Secondary | ICD-10-CM | POA: Diagnosis not present

## 2022-02-08 DIAGNOSIS — E1122 Type 2 diabetes mellitus with diabetic chronic kidney disease: Secondary | ICD-10-CM

## 2022-02-08 DIAGNOSIS — I129 Hypertensive chronic kidney disease with stage 1 through stage 4 chronic kidney disease, or unspecified chronic kidney disease: Secondary | ICD-10-CM

## 2022-02-08 DIAGNOSIS — R918 Other nonspecific abnormal finding of lung field: Secondary | ICD-10-CM

## 2022-02-08 DIAGNOSIS — Z6829 Body mass index (BMI) 29.0-29.9, adult: Secondary | ICD-10-CM

## 2022-02-08 DIAGNOSIS — Z9989 Dependence on other enabling machines and devices: Secondary | ICD-10-CM

## 2022-02-08 DIAGNOSIS — E663 Overweight: Secondary | ICD-10-CM

## 2022-02-08 DIAGNOSIS — E236 Other disorders of pituitary gland: Secondary | ICD-10-CM

## 2022-02-08 DIAGNOSIS — N1831 Chronic kidney disease, stage 3a: Secondary | ICD-10-CM | POA: Diagnosis not present

## 2022-02-08 DIAGNOSIS — Z2821 Immunization not carried out because of patient refusal: Secondary | ICD-10-CM

## 2022-02-08 NOTE — Patient Instructions (Addendum)
External Beam Radiation Therapy(not the type you will be getting) External beam radiation therapy is a type of radiation treatment. This type of radiation therapy can deliver radiation to a fairly large area. This is the most common type of radiation therapy for cancer. It may be done for several purposes: Treating cancer. This is done by: Destroying cancer cells. Radiation delivered during the treatment damages cancer cells. It may also damage normal cells, but normal cells have the DNA to repair themselves and cancer cells do not. Helping with symptoms of a person's cancer. Stopping the growth of any remaining cancer cells after surgery. Preventing cancer cells from growing in areas that do not have cancer (prophylactic radiation therapy). Treating or shrinking a tumor that is not cancerous (is benign). Reducing pain (palliative therapy). The amount of radiation a person receives and the length of therapy depend on the person's medical condition. This treatment is typically provided over many weeks. The person should not feel the radiation being delivered or any pain during the therapy. Let your health care provider know about: Any allergies you have. All medicines you are taking, including vitamins, herbs, eye drops, creams, and over-the-counter medicines. Any bleeding problems you have. Any surgeries you have had. Any medical conditions you have. Whether you are pregnant or may become pregnant. What are the risks? Generally, this is a safe procedure. However, radiation therapy can place a person at a higher risk for a second cancer later in life. Some people do not have side effects from the therapy. However, most people will have side effects. Side effects depend on the amount of radiation and the part of the body that was exposed to radiation. The most common side effects include: Skin changes. Hair loss. Tiredness (fatigue). Nausea and vomiting. What happens before the  procedure? Planning session You will have a planning session (simulation). During the session: Your health care provider will plan exactly where the radiation will be delivered. This area is called the treatment field. You will be positioned for your therapy. The goal is to have a position that can be reproduced for each therapy session. Temporary marks may be drawn on your body. Permanent marks may also be drawn on your body in order for you to be positioned the same way for each therapy session. A tool that holds a body part in place (immobilization device) may be used to keep the area of treatment in the correct position. General instructions Ask your health care provider about: Changing or stopping your regular medicines. Taking over-the-counter medicines, vitamins, herbs, and supplements. Follow instructions from your health care provider about eating or drinking restrictions. You may want to plan to have a responsible adult take you home from the hospital or clinic. What happens during the procedure?  You will either lie on a table or sit in a chair in the position determined for your therapy. You may have a heavy shield placed on you to protect tissues and organs that are not being treated. The radiation machine (linear accelerator) will move around you to deliver the radiation in exact doses from different angles. The machine will not touch you. If you had a heavy shield placed on you, it will be removed when the machine is finished delivering radiation. The procedure may vary among health care providers and hospitals. What happens after the procedure? Return to your normal activities as told by your health care provider. Ask your health care provider what activities are safe for you. You may feel very  tired. Summary External beam radiation therapy is the most common type of radiation treatment for cancer. The amount of radiation a person will receive and the length of therapy depend  on the person's medical condition. Most people have side effects from the therapy. Side effects depend on the amount of radiation and the part of the body that was exposed to radiation. This information is not intended to replace advice given to you by your health care provider. Make sure you discuss any questions you have with your health care provider. Document Revised: 12/22/2020 Document Reviewed: 12/22/2020 Elsevier Patient Education  [place].   Diabetes Mellitus and Nutrition, Adult When you have diabetes, or diabetes mellitus, it is very important to have healthy eating habits because your blood sugar (glucose) levels are greatly affected by what you eat and drink. Eating healthy foods in the right amounts, at about the same times every day, can help you: Manage your blood glucose. Lower your risk of heart disease. Improve your blood pressure. Reach or maintain a healthy weight. What can affect my meal plan? Every person with diabetes is different, and each person has different needs for a meal plan. Your health care provider may recommend that you work with a dietitian to make a meal plan that is best for you. Your meal plan may vary depending on factors such as: The calories you need. The medicines you take. Your weight. Your blood glucose, blood pressure, and cholesterol levels. Your activity level. Other health conditions you have, such as heart or kidney disease. How do carbohydrates affect me? Carbohydrates, also called carbs, affect your blood glucose level more than any other type of food. Eating carbs raises the amount of glucose in your blood. It is important to know how many carbs you can safely have in each meal. This is different for every person. Your dietitian can help you calculate how many carbs you should have at each meal and for each snack. How does alcohol affect me? Alcohol can cause a decrease in blood glucose (hypoglycemia), especially if you use  insulin or take certain diabetes medicines by mouth. Hypoglycemia can be a life-threatening condition. Symptoms of hypoglycemia, such as sleepiness, dizziness, and confusion, are similar to symptoms of having too much alcohol. Do not drink alcohol if: Your health care provider tells you not to drink. You are pregnant, may be pregnant, or are planning to become pregnant. If you drink alcohol: Limit how much you have to: 0-1 drink a day for women. 0-2 drinks a day for men. Know how much alcohol is in your drink. In the [place] one drink equals one 12 oz bottle of beer (355 mL), one 5 oz glass of wine (148 mL), or one 1 oz glass of hard liquor (44 mL). Keep yourself hydrated with water, diet soda, or unsweetened iced tea. Keep in mind that regular soda, juice, and other mixers may contain a lot of sugar and must be counted as carbs. What are tips for following this plan?  Reading food labels Start by checking the serving size on the Nutrition Facts label of packaged foods and drinks. The number of calories and the amount of carbs, fats, and other nutrients listed on the label are based on one serving of the item. Many items contain more than one serving per package. Check the total grams (g) of carbs in one serving. Check the number of grams of saturated fats and trans fats in one serving. Choose foods that have a low  amount or none of these fats. Check the number of milligrams (mg) of salt (sodium) in one serving. Most people should limit total sodium intake to less than 2,300 mg per day. Always check the nutrition information of foods labeled as "low-fat" or "nonfat." These foods may be higher in added sugar or refined carbs and should be avoided. Talk to your dietitian to identify your daily goals for nutrients listed on the label. Shopping Avoid buying canned, pre-made, or processed foods. These foods tend to be high in fat, sodium, and added sugar. Shop around the outside edge of the grocery  store. This is where you will most often find fresh fruits and vegetables, bulk grains, fresh meats, and fresh dairy products. Cooking Use low-heat cooking methods, such as baking, instead of high-heat cooking methods, such as deep frying. Cook using healthy oils, such as olive, canola, or sunflower oil. Avoid cooking with butter, cream, or high-fat meats. Meal planning Eat meals and snacks regularly, preferably at the same times every day. Avoid going long periods of time without eating. Eat foods that are high in fiber, such as fresh fruits, vegetables, beans, and whole grains. Eat 4-6 oz (112-168 g) of lean protein each day, such as lean meat, chicken, fish, eggs, or tofu. One ounce (oz) (28 g) of lean protein is equal to: 1 oz (28 g) of meat, chicken, or fish. 1 egg.  cup (62 g) of tofu. Eat some foods each day that contain healthy fats, such as avocado, nuts, seeds, and fish. What foods should I eat? Fruits Berries. Apples. Oranges. Peaches. Apricots. Plums. Grapes. Mangoes. Papayas. Pomegranates. Kiwi. Cherries. Vegetables Leafy greens, including lettuce, spinach, kale, chard, collard greens, mustard greens, and cabbage. Beets. Cauliflower. Broccoli. Carrots. Green beans. Tomatoes. Peppers. Onions. Cucumbers. Brussels sprouts. Grains Whole grains, such as whole-wheat or whole-grain bread, crackers, tortillas, cereal, and pasta. Unsweetened oatmeal. Quinoa. Brown or wild rice. Meats and other proteins Seafood. Poultry without skin. Lean cuts of poultry and beef. Tofu. Nuts. Seeds. Dairy Low-fat or fat-free dairy products such as milk, yogurt, and cheese. The items listed above may not be a complete list of foods and beverages you can eat and drink. Contact a dietitian for more information. What foods should I avoid? Fruits Fruits canned with syrup. Vegetables Canned vegetables. Frozen vegetables with butter or cream sauce. Grains Refined white flour and flour products such as  bread, pasta, snack foods, and cereals. Avoid all processed foods. Meats and other proteins Fatty cuts of meat. Poultry with skin. Breaded or fried meats. Processed meat. Avoid saturated fats. Dairy Full-fat yogurt, cheese, or milk. Beverages Sweetened drinks, such as soda or iced tea. The items listed above may not be a complete list of foods and beverages you should avoid. Contact a dietitian for more information. Questions to ask a health care provider Do I need to meet with a certified diabetes care and education specialist? Do I need to meet with a dietitian? What number can I call if I have questions? When are the best times to check my blood glucose? Where to find more information: American Diabetes Association: diabetes.org Academy of Nutrition and Dietetics: eatright.[employer] of Diabetes and Digestive and Kidney Diseases: [employer] Association of Diabetes Care & Education Specialists: diabeteseducator.org Summary It is important to have healthy eating habits because your blood sugar (glucose) levels are greatly affected by what you eat and drink. It is important to use alcohol carefully. A healthy meal plan will help you manage your blood glucose  and lower your risk of heart disease. Your health care provider may recommend that you work with a dietitian to make a meal plan that is best for you. This information is not intended to replace advice given to you by your health care provider. Make sure you discuss any questions you have with your health care provider. Document Revised: 09/25/2019 Document Reviewed: 09/25/2019 Elsevier Patient Education  [place].

## 2022-02-08 NOTE — Progress Notes (Signed)
Barnet Glasgow Martin,acting as a Education administrator for Maximino Greenland, MD.,have documented all relevant documentation on the behalf of Maximino Greenland, MD,as directed by  Maximino Greenland, MD while in the presence of Maximino Greenland, MD.    Subjective:     Patient ID: Leah Wiggins , female    DOB: [birth year] , 88 y.o.   MRN: [id number]   Chief Complaint  Patient presents with   Diabetes   Hypertension    HPI  The patient is here today for a diabetes and blood pressure follow-up.  She is accompanied by her daughter today. She reports compliance with meds.  Since her last visit, she has had pulmonary evaluation of f/u of LUL mass. F/u CT revealed an enlargement of this lung lesion, findings suggestive of carcinoma. She has had PET scan and is scheduled to have XRT in hopes this will treat the lesion. It is not felt that she is a good surgical candidate due to age and frailty.   BP Readings from Last 3 Encounters: 02/08/22 : (!) 142/60 01/25/22 : (!) 141/106 01/12/22 : 124/60    Diabetes She presents for her follow-up diabetic visit. She has type 2 diabetes mellitus. There are no hypoglycemic associated symptoms. Pertinent negatives for diabetes include no blurred vision. There are no hypoglycemic complications. Risk factors for coronary artery disease include diabetes mellitus, dyslipidemia, hypertension, obesity, sedentary lifestyle and post-menopausal.  Hypertension This is a chronic problem. The current episode started more than 1 year ago. The problem has been gradually improving since onset. The problem is controlled. Pertinent negatives include no blurred vision. The current treatment provides moderate improvement.     Past Medical History:  Diagnosis Date   Arthritis    "shoulders" (05/04/2017)   Breast cancer, right breast ([place]) 1991   GERD (gastroesophageal reflux disease)    Hard of hearing    Hyperlipidemia    Hypertension    Memory loss    Non-stress test nonreactive,  04/24/12, normal 12/10/2012   OSA on CPAP    "don't wear it all the time" (05/04/2017)   PVD (peripheral vascular disease) ([place])    Tobacco use 12/10/2012   Type II diabetes mellitus ([place])      Family History  Problem Relation Age of Onset   Heart disease Mother    Stroke Mother    Stroke Father    Cancer Sister        GIST   Cancer Brother        thyroid    Diabetes Son    Stroke Son      Current Outpatient Medications:    albuterol (PROVENTIL) (2.5 MG/3ML) 0.083% nebulizer solution, Take 3 mLs (2.5 mg total) by nebulization every 4 (four) hours as needed for wheezing or shortness of breath., Disp: 75 mL, Rfl: 2   alendronate (FOSAMAX) 70 MG tablet, Take 1 tablet (70 mg total) by mouth once a week. TAKE 1 TABLET EVERY WEDNESDAY WITH A FULL GLASS OF WATER ON AN EMPTY STOMACH Strength: 70 mg, Disp: 12 tablet, Rfl: 3   ARTIFICIAL TEAR OP, Place 1 drop into both eyes 2 (two) times daily as needed (dry eyes)., Disp: , Rfl:    aspirin EC 81 MG tablet, Take 81 mg by mouth daily., Disp: , Rfl:    betamethasone dipropionate (DIPROLENE) 0.05 % ointment, Apply 1 application. topically 2 (two) times daily as needed (rash)., Disp: , Rfl:    Blood Glucose Monitoring Suppl (FREESTYLE FREEDOM LITE) w/Device KIT,  Use to check blood sugar 3 times a day. Dx code e11.65, Disp: 1 kit, Rfl: 3   budesonide (PULMICORT) 0.25 MG/2ML nebulizer solution, Take 2 mLs (0.25 mg total) by nebulization in the morning and at bedtime., Disp: 60 mL, Rfl: 11   clotrimazole-betamethasone (LOTRISONE) cream, Apply to affected area 2 times daily prn (Patient taking differently: Apply 1 application  topically 2 (two) times daily as needed (irritation on feet).), Disp: 30 g, Rfl: 0   ferrous sulfate 325 (65 FE) MG EC tablet, Take 1 tablet (325 mg total) by mouth 3 (three) times daily with meals., Disp: 90 tablet, Rfl: 2   FERROUS SULFATE PO, Take 2 tablets by mouth daily. gummy, Disp: , Rfl:    Fluocinolone Acetonide (DERMOTIC)  0.01 % OIL, Place 3 drops in ear(s) in the morning and at bedtime., Disp: 20 mL, Rfl: 0   formoterol (PERFOROMIST) 20 MCG/2ML nebulizer solution, Take 2 mLs (20 mcg total) by nebulization 2 (two) times daily., Disp: 60 mL, Rfl: 11   glucose blood (FREESTYLE LITE) test strip, Use to check blood sugar 3 times a day. Dx code e11.65, Disp: 150 each, Rfl: 3   hydrocortisone 2.5 % cream, Apply 1 application. topically daily as needed (itching)., Disp: , Rfl:    metFORMIN (GLUCOPHAGE-XR) 500 MG 24 hr tablet, TAKE 1 TABLET TWICE A DAY (Patient taking differently: Take 500 mg by mouth in the morning and at bedtime.), Disp: 180 tablet, Rfl: 3   MICARDIS 40 MG tablet, TAKE 1 TABLET DAILY (Patient taking differently: Take 40 mg by mouth daily.), Disp: 90 tablet, Rfl: 3   Multiple Vitamins-Minerals (ALIVE WOMENS 50+ PO), Take 1 tablet by mouth daily., Disp: , Rfl:    pravastatin (PRAVACHOL) 40 MG tablet, TAKE 1 TABLET DAILY (Patient taking differently: Take 40 mg by mouth daily.), Disp: 90 tablet, Rfl: 3   Spacer/Aero-Holding Chambers (AEROCHAMBER PLUS) inhaler, Use as instructed, Disp: 1 each, Rfl: 2   triamcinolone (KENALOG) 0.1 %, Apply 1 application. topically 2 (two) times daily as needed (irritaton on feet)., Disp: , Rfl:    mometasone (ELOCON) 0.1 % lotion, Apply 1 application topically daily as needed (IRRITATION).  (Patient not taking: Reported on 01/25/2022), Disp: , Rfl: 0   pantoprazole (PROTONIX) 40 MG tablet, TAKE 1 TABLET DAILY AT 12 NOON (Patient not taking: Reported on 01/25/2022), Disp: 90 tablet, Rfl: 3   Allergies  Allergen Reactions   Bactrim [Sulfamethoxazole-Trimethoprim] Swelling    Marked angioedema requiring hospitalization   Penicillins Other (See Comments)    Has patient had a PCN reaction causing immediate rash, facial/tongue/throat swelling, SOB or lightheadedness with hypotension: Unk Has patient had a PCN reaction causing severe rash involving mucus membranes or skin necrosis:  Unk Has patient had a PCN reaction that required hospitalization: Unk Has patient had a PCN reaction occurring within the last 10 years: No If all of the above answers are "NO", then may proceed with Cephalosporin use.     Review of Systems  Constitutional: Negative.   HENT: Negative.    Eyes: Negative.  Negative for blurred vision.  Respiratory: Negative.    Cardiovascular: Negative.   Gastrointestinal: Negative.   Neurological: Negative.        Daughter is concerned about pts mobility. States she does not move around much in her home.   Psychiatric/Behavioral: Negative.       Today's Vitals   02/08/22 1450 02/08/22 1518  BP: (!) 142/60 136/70  Pulse: 85   Temp: 98.3 F (36.8  C)   TempSrc: Oral   Weight: 162 lb 12.8 oz (73.8 kg)   Height: _0  (1.575 m)   PainSc: 0-No pain    Body mass index is 29.78 kg/m.  Wt Readings from Last 3 Encounters:  02/08/22 162 lb 12.8 oz (73.8 kg)  01/25/22 167 lb 6.4 oz (75.9 kg)  01/12/22 165 lb 3.2 oz (74.9 kg)    Objective:  Physical Exam Vitals and nursing note reviewed.  Constitutional:      Appearance: Normal appearance.  HENT:     Head: Normocephalic and atraumatic.     Nose:     Comments: Masked     Mouth/Throat:     Comments: Masked  Eyes:     Extraocular Movements: Extraocular movements intact.  Cardiovascular:     Rate and Rhythm: Normal rate and regular rhythm.     Heart sounds: Normal heart sounds.  Pulmonary:     Effort: Pulmonary effort is normal.     Breath sounds: Rhonchi present.     Comments: Few,scattered rhonchi Diminished breath sounds b/l bases Musculoskeletal:     Cervical back: Normal range of motion.  Skin:    General: Skin is warm.  Neurological:     General: No focal deficit present.     Mental Status: She is alert.  Psychiatric:        Mood and Affect: Mood normal.        Behavior: Behavior normal.     Assessment And Plan:     1. Type 2 diabetes mellitus with stage 3a chronic kidney  disease, without long-term current use of insulin (HCC) Comments: Chronic, I will check a1c today.  Importance of medication/dietary compliance was d/w patient. Reminded she should be OFF metformin due to renal function. - Hemoglobin A1c  2. Hypertensive nephropathy Comments: Chronic, fair control. Goal BP<130/80. However, her current BP is acceptable for her age group. - BMP8+eGFR  3. Mass of left lung Comments: Imaging is suggestive of carcinoma. I do appreciate the input of both Pulmonary and Rad Onc. Pt has positive attitude regarding her condition.  4. Gait abnormality Comments: She is currently ambulatory with cane. We will wait until she has completed XRT to start home PT.  She and her daughter are in agreement w/ tx plan.  5. Overweight with body mass index (BMI) of 29 to 29.9 in adult Comments: Her BMI is acceptable for her demographic.  6. Herpes zoster vaccination declined   Patient was given opportunity to ask questions. Patient verbalized understanding of the plan and was able to repeat key elements of the plan. All questions were answered to their satisfaction.   I, Maximino Greenland, MD, have reviewed all documentation for this visit. The documentation on 02/21/22 for the exam, diagnosis, procedures, and orders are all accurate and complete.   IF YOU HAVE BEEN REFERRED TO A SPECIALIST, IT MAY TAKE 1-2 WEEKS TO SCHEDULE/PROCESS THE REFERRAL. IF YOU HAVE NOT HEARD FROM US/SPECIALIST IN TWO WEEKS, PLEASE GIVE [place] A CALL AT [phone] X [phone].   THE PATIENT IS ENCOURAGED TO PRACTICE SOCIAL DISTANCING DUE TO THE COVID-19 PANDEMIC.

## 2022-02-09 LAB — BMP8+EGFR
BUN/Creatinine Ratio: 19 (ref 12–28)
BUN: 17 mg/dL (ref 10–36)
CO2: 26 mmol/L (ref 20–29)
Calcium: 9.6 mg/dL (ref 8.7–10.3)
Chloride: 104 mmol/L (ref 96–106)
Creatinine, Ser: 0.88 mg/dL (ref 0.57–1.00)
Glucose: 109 mg/dL — ABNORMAL HIGH (ref 70–99)
Potassium: 4.2 mmol/L (ref 3.5–5.2)
Sodium: 145 mmol/L — ABNORMAL HIGH (ref 134–144)
eGFR: 61 mL/min/{1.73_m2} (ref >59)

## 2022-02-09 LAB — HEMOGLOBIN A1C
Est. average glucose Bld gHb Est-mCnc: 151 mg/dL
Hgb A1c MFr Bld: 6.9 % — ABNORMAL HIGH (ref 4.8–5.6)

## 2022-02-16 ENCOUNTER — Encounter: Payer: Self-pay | Admitting: Internal Medicine

## 2022-02-21 ENCOUNTER — Encounter: Payer: Self-pay | Admitting: Internal Medicine

## 2022-02-21 DIAGNOSIS — E663 Overweight: Secondary | ICD-10-CM | POA: Insufficient documentation

## 2022-02-21 DIAGNOSIS — R269 Unspecified abnormalities of gait and mobility: Secondary | ICD-10-CM | POA: Insufficient documentation

## 2022-02-21 DIAGNOSIS — R918 Other nonspecific abnormal finding of lung field: Secondary | ICD-10-CM | POA: Insufficient documentation

## 2022-02-22 ENCOUNTER — Encounter: Payer: Self-pay | Admitting: Internal Medicine

## 2022-03-09 ENCOUNTER — Ambulatory Visit (INDEPENDENT_AMBULATORY_CARE_PROVIDER_SITE_OTHER): Payer: Medicare Other

## 2022-03-09 ENCOUNTER — Encounter: Payer: Self-pay | Admitting: Internal Medicine

## 2022-03-09 VITALS — BP 130/60 | HR 94 | Temp 98.6°F | Ht 60.0 in | Wt 165.2 lb

## 2022-03-09 DIAGNOSIS — Z0000 Encounter for general adult medical examination without abnormal findings: Secondary | ICD-10-CM | POA: Diagnosis not present

## 2022-03-09 NOTE — Progress Notes (Signed)
Subjective:   Leah Wiggins is a 89 y.o. female who presents for Medicare Annual (Subsequent) preventive examination.  Review of Systems     Cardiac Risk Factors include: advanced age (>78mn, >>75women);diabetes mellitus;dyslipidemia;hypertension;obesity (BMI >30kg/m2)     Objective:    Today's Vitals   03/09/22 1439  BP: 130/60  Pulse: 94  Temp: 98.6 F (37 C)  TempSrc: Oral  SpO2: 98%  Weight: 165 lb 3.2 oz (74.9 kg)  Height: 5' (1.524 m)   Body mass index is 32.26 kg/m.     03/09/2022    2:52 PM 01/25/2022   12:33 PM 03/03/2021   10:37 AM 02/26/2020   10:21 AM 02/14/2019    8:46 AM 05/05/2018    2:19 PM 02/07/2018    2:26 PM  Advanced Directives  Does Patient Have a Medical Advance Directive? Yes _0  No  Type of AParamedicof [place]ocahont[place] will        Copy of HB[place] Chart? No - copy requested        Would patient like information on creating a medical advance directive?  No - Patient declined  No - Patient declined Yes (MAU/Ambulatory/Procedural Areas - Information given) No - Patient declined Yes (MAU/Ambulatory/Procedural Areas - Information given)    Current Medications (verified) Outpatient Encounter Medications as of 03/09/2022  Medication Sig   alendronate (FOSAMAX) 70 MG tablet Take 1 tablet (70 mg total) by mouth once a week. TAKE 1 TABLET EVERY WEDNESDAY WITH A FULL GLASS OF WATER ON AN EMPTY STOMACH Strength: 70 mg   ARTIFICIAL TEAR OP Place 1 drop into both eyes 2 (two) times daily as needed (dry eyes).   aspirin EC 81 MG tablet Take 81 mg by mouth daily.   betamethasone dipropionate (DIPROLENE) 0.05 % ointment Apply 1 application. topically 2 (two) times daily as needed (rash).   Blood Glucose Monitoring Suppl (FREESTYLE FREEDOM LITE) w/Device KIT Use to check blood sugar 3 times a day. Dx code e11.65   budesonide (PULMICORT) 0.25 MG/2ML nebulizer solution Take 2 mLs (0.25 mg total) by  nebulization in the morning and at bedtime.   clotrimazole-betamethasone (LOTRISONE) cream Apply to affected area 2 times daily prn (Patient taking differently: Apply 1 application  topically 2 (two) times daily as needed (irritation on feet).)   FERROUS SULFATE PO Take 2 tablets by mouth daily. gummy   Fluocinolone Acetonide (DERMOTIC) 0.01 % OIL Place 3 drops in ear(s) in the morning and at bedtime.   formoterol (PERFOROMIST) 20 MCG/2ML nebulizer solution Take 2 mLs (20 mcg total) by nebulization 2 (two) times daily.   glucose blood (FREESTYLE LITE) test strip Use to check blood sugar 3 times a day. Dx code e11.65   hydrocortisone 2.5 % cream Apply 1 application. topically daily as needed (itching).   MICARDIS 40 MG tablet TAKE 1 TABLET DAILY (Patient taking differently: Take 40 mg by mouth daily.)   Multiple Vitamins-Minerals (ALIVE WOMENS 50+ PO) Take 1 tablet by mouth daily.   pravastatin (PRAVACHOL) 40 MG tablet TAKE 1 TABLET DAILY (Patient taking differently: Take 40 mg by mouth daily.)   Spacer/Aero-Holding Chambers (AEROCHAMBER PLUS) inhaler Use as instructed   triamcinolone (KENALOG) 0.1 % Apply 1 application. topically 2 (two) times daily as needed (irritaton on feet).   albuterol (PROVENTIL) (2.5 MG/3ML) 0.083% nebulizer solution Take 3 mLs (2.5 mg total) by nebulization every 4 (four) hours as needed for wheezing or shortness of breath.  ferrous sulfate 325 (65 FE) MG EC tablet Take 1 tablet (325 mg total) by mouth 3 (three) times daily with meals.   mometasone (ELOCON) 0.1 % lotion Apply 1 application topically daily as needed (IRRITATION).  (Patient not taking: Reported on 01/25/2022)   pantoprazole (PROTONIX) 40 MG tablet TAKE 1 TABLET DAILY AT 12 NOON (Patient not taking: Reported on 03/09/2022)   No facility-administered encounter medications on file as of 03/09/2022.    Allergies (verified) Bactrim [sulfamethoxazole-trimethoprim] and Penicillins   History: Past Medical  History:  Diagnosis Date   Arthritis    "shoulders" (05/04/2017)   Breast cancer, right breast (HCC) 1991   GERD (gastroesophageal reflux disease)    Hard of hearing    Hyperlipidemia    Hypertension    Memory loss    Non-stress test nonreactive, 04/24/12, normal 12/10/2012   OSA on CPAP    "don't wear it all the time" (05/04/2017)   PVD (peripheral vascular disease) (HCC)    Tobacco use 12/10/2012   Type II diabetes mellitus (HCC)    Past Surgical History:  Procedure Laterality Date   APPENDECTOMY     BIOPSY  01/19/2018   Procedure: BIOPSY;  Surgeon: Hung, Patrick, MD;  Location: [facility];  Service: Endoscopy;;   BREAST BIOPSY Right 1991   CARDIOVASCULAR STRESS TEST  04/24/2012   No significant ST segment change suggestive of ischemia.   CATARACT EXTRACTION, BILATERAL Bilateral    COLONOSCOPY WITH PROPOFOL N/A 01/19/2018   Procedure: COLONOSCOPY WITH PROPOFOL;  Surgeon: Hung, Patrick, MD;  Location: [facility];  Service: Endoscopy;  Laterality: N/A;   ESOPHAGOGASTRODUODENOSCOPY (EGD) WITH PROPOFOL N/A 01/19/2018   Procedure: ESOPHAGOGASTRODUODENOSCOPY (EGD) WITH PROPOFOL;  Surgeon: Hung, Patrick, MD;  Location: [facility];  Service: Endoscopy;  Laterality: N/A;   JOINT REPLACEMENT     LOWER EXTREMITY ARTERIAL DOPPLER Bilateral 05/07/2012   Left ABI-demonstrated mild arterial insufficiency. Right CIA 50-69% diameter reduction. Right SFA less than 50% diameter reduction. Left SFA 70-99% diameter reduction. Bilateral Runoff-Posterior tibial arteries appeared occluded.   MASTECTOMY PARTIAL / LUMPECTOMY W/ AXILLARY LYMPHADENECTOMY Right 1991    TOTAL HIP ARTHROPLASTY Right 2011   Family History  Problem Relation Age of Onset   Heart disease Mother    Stroke Mother    Stroke Father    Cancer Sister        GIST   Cancer Brother        thyroid    Diabetes Son    Stroke Son    Social History   Socioeconomic History   Marital status: Widowed    Spouse name: Not on file    Number of children: 8   Years of education: 8th grade   Highest education level: Not on file  Occupational History   Occupation: retired  Tobacco Use   Smoking status: Former    Packs/day: 0.50    Years: 70.00    Total pack years: 35.00    Types: Cigarettes    Start date: 03/08/1945    Quit date: 2020    Years since quitting: 4.0   Smokeless tobacco: Never   Tobacco comments:    has tried cold turkey  Vaping Use   Vaping Use: Never used  Substance and Sexual Activity   Alcohol use: No    Alcohol/week: 0.0 standard drinks of alcohol   Drug use: No   Sexual activity: Not Currently  Other Topics Concern   Not on file  Social History Narrative   Lives at home with   her son.   She had eight children (two sets of twins).   No daily use of caffeine.   Right-handed.   Social Determinants of Health   Financial Resource Strain: Low Risk  (03/09/2022)   Overall Financial Resource Strain (CARDIA)    Difficulty of Paying Living Expenses: Not hard at all  Food Insecurity: No Food Insecurity (03/09/2022)   Hunger Vital Sign    Worried About Running Out of Food in the Last Year: Never true    Ran Out of Food in the Last Year: Never true  Transportation Needs: No Transportation Needs (03/09/2022)   PRAPARE - Transportation    Lack of Transportation (Medical): No    Lack of Transportation (Non-Medical): No  Physical Activity: Inactive (03/09/2022)   Exercise Vital Sign    Days of Exercise per Week: 0 days    Minutes of Exercise per Session: 0 min  Stress: No Stress Concern Present (03/09/2022)   [employer] - Occupational Stress Questionnaire    Feeling of Stress : Not at all  Social Connections: Not on file    Tobacco Counseling Counseling given: Not Answered Tobacco comments: has tried cold turkey   Clinical Intake:  Pre-visit preparation completed: Yes  Pain : No/denies pain     Nutritional Status: BMI > 30  Obese Nutritional Risks:  None Diabetes: Yes  How often do you need to have someone help you when you read instructions, pamphlets, or other written materials from your doctor or pharmacy?: 1 - Never  Diabetic? Yes Nutrition Risk Assessment:  Has the patient had any N/V/D within the last 2 months?  No  Does the patient have any non-healing wounds?  No  Has the patient had any unintentional weight loss or weight gain?  No   Diabetes:  Is the patient diabetic?  Yes  If diabetic, was a CBG obtained today?  No  Did the patient bring in their glucometer from home?  No  How often do you monitor your CBG's? Twice daily.   Financial Strains and Diabetes Management:  Are you having any financial strains with the device, your supplies or your medication? No .  Does the patient want to be seen by Chronic Care Management for management of their diabetes?  No  Would the patient like to be referred to a Nutritionist or for Diabetic Management?  No   Diabetic Exams:  Diabetic Eye Exam: Completed 12/20/2021 Diabetic Foot Exam: Completed 10/04/2021   Interpreter Needed?: No  Information entered by :: NAllen LPN   Activities of Daily Living    03/09/2022    2:53 PM  In your present state of health, do you have any difficulty performing the following activities:  Hearing? 1  Vision? 1  Difficulty concentrating or making decisions? 1  Walking or climbing stairs? 1  Dressing or bathing? 0  Doing errands, shopping? 1  Preparing Food and eating ? N  Using the Toilet? N  In the past six months, have you accidently leaked urine? Y  Do you have problems with loss of bowel control? N  Managing your Medications? N  Managing your Finances? N  Housekeeping or managing your Housekeeping? N    Patient Care Team: Sanders, Robyn, MD as PCP - General (Internal Medicine)  Indicate any recent Medical Services you may have received from other than [facility] providers in the past year (date may be approximate).     Assessment:    This is a routine wellness examination for   Leah Wiggins.  Hearing/Vision screen Vision Screening - Comments:: Regular eye exams, Dr. Gershon Crane  Dietary issues and exercise activities discussed: Current Exercise Habits: The patient does not participate in regular exercise at present   Goals Addressed             This Visit's Progress    Patient Stated       03/09/2022, get better       Depression Screen    03/09/2022    2:53 PM 02/08/2022    2:49 PM 10/04/2021   11:43 AM 03/03/2021   10:38 AM 02/26/2020   10:22 AM 05/20/2019   11:08 AM 02/14/2019    8:47 AM  PHQ 2/9 Scores  PHQ - 2 Score 0 0 0 0 0 0 0  PHQ- 9 Score       0    Fall Risk    03/09/2022    2:52 PM 02/08/2022    2:49 PM 10/04/2021   11:43 AM 03/03/2021   10:37 AM 02/26/2020   10:22 AM  [place] in the past year? 0 0 0 0 0  Number falls in past yr: 0 0 0    Injury with Fall? 0 0 0    Risk for fall due to : Impaired mobility;Impaired balance/gait Impaired balance/gait;Impaired mobility No Fall Risks Medication side effect Impaired balance/gait;Medication side effect  Follow up Falls prevention discussed;Education provided;Falls evaluation completed Falls evaluation completed Falls evaluation completed Falls evaluation completed;Education provided;Falls prevention discussed Falls evaluation completed;Education provided;Falls prevention discussed    FALL RISK PREVENTION PERTAINING TO THE HOME:  Any stairs in or around the home? No  If so, are there any without handrails?  N/a Home free of loose throw rugs in walkways, pet beds, electrical cords, etc? Yes  Adequate lighting in your home to reduce risk of falls? Yes   ASSISTIVE DEVICES UTILIZED TO PREVENT FALLS:  Life alert? No  Use of a cane, walker or w/c? Yes  Grab bars in the bathroom? Yes  Shower chair or bench in shower? Yes  Elevated toilet seat or a handicapped toilet? Yes   TIMED UP AND GO:  Was the test performed? Yes .  Length of time  to ambulate 10 feet: 7 sec.   Gait slow and steady with assistive device  Cognitive Function:    12/23/2019    3:34 PM 06/24/2019    8:33 AM  MMSE - Mini Mental State Exam  Orientation to time 4 5  Orientation to Place 5 5  Registration 3 3  Attention/ Calculation 0 2  Recall 2 0  Language- name 2 objects 2 2  Language- repeat 0 1  Language- follow 3 step command 3 3  Language- read & follow direction 1 1  Write a sentence 1 1  Copy design 0 0  Total score 21 23        03/09/2022    2:56 PM 03/03/2021   10:42 AM 02/26/2020   10:27 AM 02/14/2019    8:51 AM 02/07/2018    2:31 PM  6CIT Screen  What Year? 0 points 0 points 0 points 0 points 0 points  What month? 0 points 0 points 0 points 0 points 0 points  What time? 0 points 0 points 3 points 3 points 0 points  Count back from 20 2 points 0 points 0 points 0 points 0 points  Months in reverse 4 points 4 points 2 points 4 points 4 points  Repeat phrase 10 points  8 points 4 points 10 points 2 points  Total Score 16 points 12 points 9 points 17 points 6 points    Immunizations Immunization History  Administered Date(s) Administered   Influenza Whole 02/16/2010   PFIZER(Purple Top)SARS-COV-2 Vaccination 04/29/2019, 05/20/2019, 01/09/2020, 09/22/2020   PNEUMOCOCCAL CONJUGATE-20 01/25/2021   Pneumococcal-Unspecified 06/19/2013   Tdap 01/25/2021    TDAP status: Up to date  Flu Vaccine status: Declined, Education has been provided regarding the importance of this vaccine but patient still declined. Advised may receive this vaccine at local pharmacy or Health Dept. Aware to provide a copy of the vaccination record if obtained from local pharmacy or Health Dept. Verbalized acceptance and understanding.  Pneumococcal vaccine status: Up to date  Covid-19 vaccine status: Completed vaccines  Qualifies for Shingles Vaccine? No   Zostavax completed No   Shingrix Completed?: No.    Education has been provided regarding the  importance of this vaccine. Patient has been advised to call insurance company to determine out of pocket expense if they have not yet received this vaccine. Advised may also receive vaccine at local pharmacy or Health Dept. Verbalized acceptance and understanding.  Screening Tests Health Maintenance  Topic Date Due   COVID-19 Vaccine (5 - 2023-24 season) 11/05/2021   Zoster Vaccines- Shingrix (1 of 2) 05/10/2022 (Originally 11/20/1945)   INFLUENZA VACCINE  06/05/2022 (Originally 10/05/2021)   HEMOGLOBIN A1C  08/10/2022   FOOT EXAM  10/05/2022   OPHTHALMOLOGY EXAM  12/21/2022   Medicare Annual Wellness (AWV)  03/10/2023   DTaP/Tdap/Td (2 - Td or Tdap) 01/26/2031   Pneumonia Vaccine 80+ Years old  Completed   DEXA SCAN  Completed   HPV VACCINES  Aged Out    Health Maintenance  Health Maintenance Due  Topic Date Due   COVID-19 Vaccine (5 - 2023-24 season) 11/05/2021    Colorectal cancer screening: No longer required.   Mammogram status: No longer required due to age.  Bone Density status: Completed 04/02/2001  Lung Cancer Screening: (Low Dose CT Chest recommended if Age 78-80 years, 30 pack-year currently smoking OR have quit w/in 15years.) does not qualify.   Lung Cancer Screening Referral: no  Additional Screening:  Hepatitis C Screening: does not qualify;   Vision Screening: Recommended annual ophthalmology exams for early detection of glaucoma and other disorders of the eye. Is the patient up to date with their annual eye exam?  Yes  Who is the provider or what is the name of the office in which the patient attends annual eye exams? Dr. Gershon Crane If pt is not established with a provider, would they like to be referred to a provider to establish care? No .   Dental Screening: Recommended annual dental exams for proper oral hygiene  Community Resource Referral / Chronic Care Management: CRR required this visit?  No   CCM required this visit?  No      Plan:     I have  personally reviewed and noted the following in the patient's chart:   Medical and social history Use of alcohol, tobacco or illicit drugs  Current medications and supplements including opioid prescriptions. Patient is not currently taking opioid prescriptions. Functional ability and status Nutritional status Physical activity Advanced directives List of other physicians Hospitalizations, surgeries, and ER visits in previous 12 months Vitals Screenings to include cognitive, depression, and falls Referrals and appointments  In addition, I have reviewed and discussed with patient certain preventive protocols, quality metrics, and best practice recommendations. A written personalized care plan  for preventive services as well as general preventive health recommendations were provided to patient.      E , LPN   03/09/2022   Nurse Notes: none      

## 2022-03-09 NOTE — Patient Instructions (Signed)
Leah Wiggins , Thank you for taking time to come for your Medicare Wellness Visit. I appreciate your ongoing commitment to your health goals. Please review the following plan we discussed and let me know if I can assist you in the future.   These are the goals we discussed:  Goals      Patient Stated     02/14/2019, no goals     Patient Stated     02/26/2020, get well     Patient Stated     03/03/2021, start eating healthy     Patient Stated     03/09/2022, get better        This is a list of the screening recommended for you and due dates:  Health Maintenance  Topic Date Due   COVID-19 Vaccine (5 - 2023-24 season) 11/05/2021   Zoster (Shingles) Vaccine (1 of 2) 05/10/2022*   Flu Shot  06/05/2022*   Hemoglobin A1C  08/10/2022   Complete foot exam   10/05/2022   Eye exam for diabetics  12/21/2022   Medicare Annual Wellness Visit  03/10/2023   DTaP/Tdap/Td vaccine (2 - Td or Tdap) 01/26/2031   Pneumonia Vaccine  Completed   DEXA scan (bone density measurement)  Completed   HPV Vaccine  Aged Out  *Topic was postponed. The date shown is not the original due date.    Advanced directives: Please bring a copy of your POA (Power of Attorney) and/or Living Will to your next appointment.   Conditions/risks identified: none  Next appointment: Follow up in one year for your annual wellness visit     Preventive Care 65 Years and Older, Female Preventive care refers to lifestyle choices and visits with your health care provider that can promote health and wellness. What does preventive care include? A yearly physical exam. This is also called an annual well check. Dental exams once or twice a year. Routine eye exams. Ask your health care provider how often you should have your eyes checked. Personal lifestyle choices, including: Daily care of your teeth and gums. Regular physical activity. Eating a healthy diet. Avoiding tobacco and drug use. Limiting alcohol use. Practicing  safe sex. Taking low-dose aspirin every day. Taking vitamin and mineral supplements as recommended by your health care provider. What happens during an annual well check? The services and screenings done by your health care provider during your annual well check will depend on your age, overall health, lifestyle risk factors, and family history of disease. Counseling  Your health care provider may ask you questions about your: Alcohol use. Tobacco use. Drug use. Emotional well-being. Home and relationship well-being. Sexual activity. Eating habits. History of falls. Memory and ability to understand (cognition). Work and work Statistician. Reproductive health. Screening  You may have the following tests or measurements: Height, weight, and BMI. Blood pressure. Lipid and cholesterol levels. These may be checked every 5 years, or more frequently if you are over 87 years old. Skin check. Lung cancer screening. You may have this screening every year starting at age 57 if you have a 30-pack-year history of smoking and currently smoke or have quit within the past 15 years. Fecal occult blood test (FOBT) of the stool. You may have this test every year starting at age 59. Flexible sigmoidoscopy or colonoscopy. You may have a sigmoidoscopy every 5 years or a colonoscopy every 10 years starting at age 81. Hepatitis C blood test. Hepatitis B blood test. Sexually transmitted disease (STD) testing. Diabetes screening. This is  done by checking your blood sugar (glucose) after you have not eaten for a while (fasting). You may have this done every 1-3 years. Bone density scan. This is done to screen for osteoporosis. You may have this done starting at age 67. Mammogram. This may be done every 1-2 years. Talk to your health care provider about how often you should have regular mammograms. Talk with your health care provider about your test results, treatment options, and if necessary, the need for more  tests. Vaccines  Your health care provider may recommend certain vaccines, such as: Influenza vaccine. This is recommended every year. Tetanus, diphtheria, and acellular pertussis (Tdap, Td) vaccine. You may need a Td booster every 10 years. Zoster vaccine. You may need this after age 54. Pneumococcal 13-valent conjugate (PCV13) vaccine. One dose is recommended after age 39. Pneumococcal polysaccharide (PPSV23) vaccine. One dose is recommended after age 71. Talk to your health care provider about which screenings and vaccines you need and how often you need them. This information is not intended to replace advice given to you by your health care provider. Make sure you discuss any questions you have with your health care provider. Document Released: 03/20/2015 Document Revised: 11/11/2015 Document Reviewed: 12/23/2014 Elsevier Interactive Patient Education  2017 [place] Prevention in the Home Falls can cause injuries. They can happen to people of all ages. There are many things you can do to make your home safe and to help prevent falls. What can I do on the outside of my home? Regularly fix the edges of walkways and driveways and fix any cracks. Remove anything that might make you trip as you walk through a door, such as a raised step or threshold. Trim any bushes or trees on the path to your home. Use bright outdoor lighting. Clear any walking paths of anything that might make someone trip, such as rocks or tools. Regularly check to see if handrails are loose or broken. Make sure that both sides of any steps have handrails. Any raised decks and porches should have guardrails on the edges. Have any leaves, snow, or ice cleared regularly. Use sand or salt on walking paths during winter. Clean up any spills in your garage right away. This includes oil or grease spills. What can I do in the bathroom? Use night lights. Install grab bars by the toilet and in the tub and shower.  Do not use towel bars as grab bars. Use non-skid mats or decals in the tub or shower. If you need to sit down in the shower, use a plastic, non-slip stool. Keep the floor dry. Clean up any water that spills on the floor as soon as it happens. Remove soap buildup in the tub or shower regularly. Attach bath mats securely with double-sided non-slip rug tape. Do not have throw rugs and other things on the floor that can make you trip. What can I do in the bedroom? Use night lights. Make sure that you have a light by your bed that is easy to reach. Do not use any sheets or blankets that are too big for your bed. They should not hang down onto the floor. Have a firm chair that has side arms. You can use this for support while you get dressed. Do not have throw rugs and other things on the floor that can make you trip. What can I do in the kitchen? Clean up any spills right away. Avoid walking on wet floors. Keep items that you  use a lot in easy-to-reach places. If you need to reach something above you, use a strong step stool that has a grab bar. Keep electrical cords out of the way. Do not use floor polish or wax that makes floors slippery. If you must use wax, use non-skid floor wax. Do not have throw rugs and other things on the floor that can make you trip. What can I do with my stairs? Do not leave any items on the stairs. Make sure that there are handrails on both sides of the stairs and use them. Fix handrails that are broken or loose. Make sure that handrails are as long as the stairways. Check any carpeting to make sure that it is firmly attached to the stairs. Fix any carpet that is loose or worn. Avoid having throw rugs at the top or bottom of the stairs. If you do have throw rugs, attach them to the floor with carpet tape. Make sure that you have a light switch at the top of the stairs and the bottom of the stairs. If you do not have them, ask someone to add them for you. What else  can I do to help prevent falls? Wear shoes that: Do not have high heels. Have rubber bottoms. Are comfortable and fit you well. Are closed at the toe. Do not wear sandals. If you use a stepladder: Make sure that it is fully opened. Do not climb a closed stepladder. Make sure that both sides of the stepladder are locked into place. Ask someone to hold it for you, if possible. Clearly mark and make sure that you can see: Any grab bars or handrails. First and last steps. Where the edge of each step is. Use tools that help you move around (mobility aids) if they are needed. These include: Canes. Walkers. Scooters. Crutches. Turn on the lights when you go into a dark area. Replace any light bulbs as soon as they burn out. Set up your furniture so you have a clear path. Avoid moving your furniture around. If any of your floors are uneven, fix them. If there are any pets around you, be aware of where they are. Review your medicines with your doctor. Some medicines can make you feel dizzy. This can increase your chance of falling. Ask your doctor what other things that you can do to help prevent falls. This information is not intended to replace advice given to you by your health care provider. Make sure you discuss any questions you have with your health care provider. Document Released: 12/18/2008 Document Revised: 07/30/2015 Document Reviewed: 03/28/2014 Elsevier Interactive Patient Education  2017 [employer].

## 2022-03-15 ENCOUNTER — Telehealth: Payer: Self-pay

## 2022-03-15 NOTE — Telephone Encounter (Signed)
Patient's granddaughter notified that her forms have been completed and placed up front. A copy is in the pt folders form in my desk. Leah Wiggins

## 2022-03-18 ENCOUNTER — Encounter (HOSPITAL_COMMUNITY): Payer: Self-pay

## 2022-03-18 ENCOUNTER — Ambulatory Visit (HOSPITAL_COMMUNITY)
Admission: RE | Admit: 2022-03-18 | Discharge: 2022-03-18 | Disposition: A | Discharge status: Home / Self Care | Payer: Medicare Other | Source: Ambulatory Visit | Attending: Emergency Medicine | Admitting: Emergency Medicine

## 2022-03-18 VITALS — BP 132/77 | HR 76 | Temp 98.6°F | Resp 18

## 2022-03-18 DIAGNOSIS — N390 Urinary tract infection, site not specified: Secondary | ICD-10-CM

## 2022-03-18 LAB — POCT URINALYSIS DIPSTICK, ED / UC
Bilirubin Urine: NEGATIVE
Glucose, UA: NEGATIVE mg/dL
Ketones, ur: NEGATIVE mg/dL
Nitrite: NEGATIVE
Protein, ur: NEGATIVE mg/dL
Specific Gravity, Urine: 1.02 (ref 1.005–1.030)
Urobilinogen, UA: 0.2 mg/dL (ref 0.0–1.0)
pH: 6 (ref 5.0–8.0)

## 2022-03-18 LAB — CBG MONITORING, ED: Glucose-Capillary: 84 mg/dL (ref 70–99)

## 2022-03-18 MED ORDER — CIPROFLOXACIN HCL 500 MG PO TABS: 500.0000 mg | ORAL_TABLET | Freq: Two times a day (BID) | ORAL | 0 refills | Status: DC

## 2022-03-18 MED ORDER — CIPROFLOXACIN HCL 500 MG PO TABS: 500.0000 mg | ORAL_TABLET | Freq: Two times a day (BID) | ORAL | 0 refills | Status: AC

## 2022-03-18 NOTE — ED Provider Notes (Signed)
[facility]    CSN: [id number] Arrival date & time: 03/18/22  1309     History   Chief Complaint Chief Complaint  Patient presents with   Vaginal Pain        Dysuria    HPI Leah Wiggins is a 89 y.o. female.  Daughter is bedside who contributes to hx 2 day history of dysuria. "Bad pain" Denies vaginal bleeding or spotting No fevers. No flank pain. Denies suprapubic pain or pressure Denies rash No confusion or AMS per daughter  Hx DM2 Reports metformin was stopped by PCP several seeks ago Chart review reveals discontinued due to renal function Checks glucose infrequently, a few days ago it was 112 Denies weakness, dizziness, abdominal pain, vomiting  History of breast cancer. Recently diagnosed with lung cancer  Past Medical History:  Diagnosis Date   Arthritis    "shoulders" (05/04/2017)   Breast cancer, right breast (HCC) 1991   GERD (gastroesophageal reflux disease)    Hard of hearing    Hyperlipidemia    Hypertension    Memory loss    Non-stress test nonreactive, 04/24/12, normal 12/10/2012   OSA on CPAP    "don't wear it all the time" (05/04/2017)   PVD (peripheral vascular disease) (HCC)    Tobacco use 12/10/2012   Type II diabetes mellitus (HCC)     Patient Active Problem List   Diagnosis Date Noted   Mass of left lung 02/21/2022   Gait abnormality 02/21/2022   Overweight with body mass index (BMI) of 29 to 29.9 in adult 02/21/2022   Cancer of upper lobe of left lung (HCC) 01/26/2022   Hypertensive nephropathy 09/03/2020   Type 2 diabetes mellitus with stage 3a chronic kidney disease, without long-term current use of insulin (HCC) 09/03/2020   Atherosclerosis of aorta (HCC) 09/03/2020   Absolute anemia 09/03/2020   Cellulitis of left foot 09/03/2020   Class 1 obesity due to excess calories with serious comorbidity and body mass index (BMI) of 33.0 to 33.9 in adult 09/03/2020   Abnormal feces 06/04/2020   Abnormal weight loss  06/04/2020   Angiodysplasia of intestine 06/04/2020   Colon cancer screening 06/04/2020   Gastroesophageal reflux disease 06/04/2020   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 hearing of right ear 06/04/2020   Prolapsed internal hemorrhoids 06/04/2020   Rectal bleeding 06/04/2020   Sensorineural hearing loss (SNHL) of right ear with restricted hearing of left ear 06/04/2020   Mild cognitive impairment 12/23/2019   Memory loss 06/24/2019   Intrinsic atopic dermatitis 09/18/2018   Chronic obstructive pulmonary disease (HCC) 04/06/2018   Prediabetes 02/07/2018   Iron deficiency anemia due to chronic blood loss 12/25/2017   Anaphylaxis 05/04/2017   Rash and nonspecific skin eruption 05/04/2017   History of breast cancer 08/14/2014   Empty sella (HCC) 07/14/2014   Pain in the chest 04/28/2014   Diabetes mellitus without complication (HCC) 03/03/2014   Chest pain 03/03/2014   Essential hypertension 06/24/2013   PAD (peripheral artery disease), abnormal ABIs 12/10/2012   Non-stress test nonreactive, 04/24/12, normal 12/10/2012   Dyslipidemia 12/10/2012   Tobacco use 12/10/2012   Thrombocytopenia (HCC) 01/24/2012   RESTLESS LEG SYNDROME 02/16/2010   METHICILLIN SUSECPTIBLE PNEUMONIA STAPH AUREUS 01/13/2010   HIP THI LEG&ANK ABRASION/FRICION BURN W/O INF 01/13/2010    Past Surgical History:  Procedure Laterality Date   APPENDECTOMY     BIOPSY  01/19/2018   Procedure: BIOPSY;  Surgeon: Jeani Hawking, MD;  Location:  [facility];  Service: Endoscopy;;   BREAST BIOPSY Right 1991   CARDIOVASCULAR STRESS TEST  04/24/2012   No significant ST segment change suggestive of ischemia.   CATARACT EXTRACTION, BILATERAL Bilateral    COLONOSCOPY WITH PROPOFOL N/A 01/19/2018   Procedure: COLONOSCOPY WITH PROPOFOL;  Surgeon: Jeani Hawking, MD;  Location: [facility];  Service: Endoscopy;  Laterality: N/A;   ESOPHAGOGASTRODUODENOSCOPY (EGD) WITH PROPOFOL N/A  01/19/2018   Procedure: ESOPHAGOGASTRODUODENOSCOPY (EGD) WITH PROPOFOL;  Surgeon: Jeani Hawking, MD;  Location: [facility];  Service: Endoscopy;  Laterality: N/A;   JOINT REPLACEMENT     LOWER EXTREMITY ARTERIAL DOPPLER Bilateral 05/07/2012   Left ABI-demonstrated mild arterial insufficiency. Right CIA 50-69% diameter reduction. Right SFA less than 50% diameter reduction. Left SFA 70-99% diameter reduction. Bilateral Runoff-Posterior tibial arteries appeared occluded.   MASTECTOMY PARTIAL / LUMPECTOMY W/ AXILLARY LYMPHADENECTOMY Right 1991    TOTAL HIP ARTHROPLASTY Right 2011    OB History   No obstetric history on file.      Home Medications    Prior to Admission medications   Medication Sig Start Date End Date Taking? Authorizing Provider  albuterol (PROVENTIL) (2.5 MG/3ML) 0.083% nebulizer solution Take 3 mLs (2.5 mg total) by nebulization every 4 (four) hours as needed for wheezing or shortness of breath. 08/06/20 03/18/22 Yes Dorothyann Peng, MD  alendronate (FOSAMAX) 70 MG tablet Take 1 tablet (70 mg total) by mouth once a week. TAKE 1 TABLET EVERY WEDNESDAY WITH A FULL GLASS OF WATER ON AN EMPTY STOMACH Strength: 70 mg 10/04/21  Yes Dorothyann Peng, MD  ARTIFICIAL TEAR OP Place 1 drop into both eyes 2 (two) times daily as needed (dry eyes).   Yes [provider]  aspirin EC 81 MG tablet Take 81 mg by mouth daily.   Yes [provider]  Blood Glucose Monitoring Suppl (FREESTYLE FREEDOM LITE) w/Device KIT Use to check blood sugar 3 times a day. Dx code e11.65 09/18/19  Yes Dorothyann Peng, MD  budesonide (PULMICORT) 0.25 MG/2ML nebulizer solution Take 2 mLs (0.25 mg total) by nebulization in the morning and at bedtime. 12/27/21  Yes Charlott Holler, MD  FERROUS SULFATE PO Take 2 tablets by mouth daily. gummy   Yes [provider]  Fluocinolone Acetonide (DERMOTIC) 0.01 % OIL Place 3 drops in ear(s) in the morning and at bedtime. 09/18/21  Yes Naje Rice, PA-C   formoterol (PERFOROMIST) 20 MCG/2ML nebulizer solution Take 2 mLs (20 mcg total) by nebulization 2 (two) times daily. 12/27/21  Yes Charlott Holler, MD  glucose blood (FREESTYLE LITE) test strip Use to check blood sugar 3 times a day. Dx code e11.65 01/10/20  Yes Dorothyann Peng, MD  MICARDIS 40 MG tablet TAKE 1 TABLET DAILY Patient taking differently: Take 40 mg by mouth daily. 02/05/21  Yes Dorothyann Peng, MD  Multiple Vitamins-Minerals (ALIVE WOMENS 50+ PO) Take 1 tablet by mouth daily.   Yes [provider]  pravastatin (PRAVACHOL) 40 MG tablet TAKE 1 TABLET DAILY Patient taking differently: Take 40 mg by mouth daily. 05/03/21  Yes Dorothyann Peng, MD  Spacer/Aero-Holding Chambers (AEROCHAMBER PLUS) inhaler Use as instructed 08/19/19  Yes Dorothyann Peng, MD  ciprofloxacin (CIPRO) 500 MG tablet Take 1 tablet (500 mg total) by mouth 2 (two) times daily for 7 days. 03/18/22 03/25/22  Garret Teale, Lurena Joiner PA-C    Family History Family History  Problem Relation Age of Onset   Heart disease Mother    Stroke Mother    Stroke Father  Cancer Sister        GIST   Cancer Brother        thyroid    Diabetes Son    Stroke Son     Social History Social History   Tobacco Use   Smoking status: Former    Packs/day: 0.50    Years: 70.00    Total pack years: 35.00    Types: Cigarettes    Start date: 03/08/1947    Quit date: 2020    Years since quitting: 4.0   Smokeless tobacco: Never   Tobacco comments:    has tried cold [place]  Vaping Use   Vaping Use: Never used  Substance Use Topics   Alcohol use: No    Alcohol/week: 0.0 standard drinks of alcohol   Drug use: No     Allergies   Bactrim [sulfamethoxazole-trimethoprim] and Penicillins   Review of Systems Review of Systems As per HPI  Physical Exam Triage Vital Signs ED Triage Vitals [03/18/22 1332]  Enc Vitals Group     BP      Pulse      Resp      Temp      Temp src      SpO2      Weight      Height      Head  Circumference      Peak Flow      Pain Score 8     Pain Loc      Pain Edu?      Excl. in GC?    No data found.  Updated Vital Signs BP 132/77 (BP Location: Left Arm)   Pulse 76   Temp 98.6 F (37 C) (Oral)   Resp 18   SpO2 92%    Physical Exam Vitals and nursing note reviewed.  Constitutional:      General: She is not in acute distress. HENT:     Mouth/Throat:     Mouth: Mucous membranes are moist.     Pharynx: Oropharynx is clear.  Eyes:     Conjunctiva/sclera: Conjunctivae normal.     Pupils: Pupils are equal, round, and reactive to light.  Cardiovascular:     Rate and Rhythm: Normal rate and regular rhythm.     Heart sounds: Normal heart sounds.  Pulmonary:     Effort: Pulmonary effort is normal.     Breath sounds: Normal breath sounds.  Abdominal:     General: Bowel sounds are normal.     Palpations: Abdomen is soft.     Tenderness: There is no abdominal tenderness. There is no right CVA tenderness or left CVA tenderness.  Neurological:     Mental Status: She is alert and oriented to person, place, and time.     UC Treatments / Results  Labs (all labs ordered are listed, but only abnormal results are displayed) Labs Reviewed  POCT URINALYSIS DIPSTICK, ED / UC - Abnormal; Notable for the following components:      Result Value   Hgb urine dipstick TRACE (*)    Leukocytes,Ua SMALL (*)    All other components within normal limits  URINE CULTURE  CBG MONITORING, ED    EKG   Radiology No results found.  Procedures Procedures (including critical care time)  Medications Ordered in UC Medications - No data to display  Initial Impression / Assessment and Plan / UC Course  I have reviewed the triage vital signs and the nursing notes.  Pertinent labs & imaging  results that were available during my care of the patient were reviewed by me and considered in my medical decision making (see chart for details).  CBG 84, has eaten today No meds currently  for DM. Discussed monitoring for low glucose, drinking juice if below 60 and rechecking. Discussed with patient and daughter signs to look for that warrant ED visit.   UA with trace hgb, small leuks. Culture is pending Will treat for acute cystitis with cipro BID x 7 days (PCN and bactrim allergy) No concerns for ascending infection at this time Return precautions discussed. Patient and daughter agree to plan  Final Clinical Impressions(s) / UC Diagnoses   Final diagnoses:  Lower urinary tract infectious disease     Discharge Instructions      Please take medication as prescribed. Take with food to avoid upset stomach. Drink lots of fluids!  Please monitor for any worsening symptoms. If they occur, go to the emergency department.      ED Prescriptions     Medication Sig Dispense Auth. Provider   ciprofloxacin (CIPRO) 500 MG tablet  (Status: Discontinued) Take 1 tablet (500 mg total) by mouth 2 (two) times daily for 7 days. 14 tablet Lycan Davee, PA-C   ciprofloxacin (CIPRO) 500 MG tablet Take 1 tablet (500 mg total) by mouth 2 (two) times daily for 7 days. 14 tablet Kynedi Profitt, Lurena Joiner, PA-C      PDMP not reviewed this encounter.   Marlow Baars, [place] 03/18/22 1433

## 2022-03-18 NOTE — ED Triage Notes (Addendum)
Pt states that she is having dysuria and vaginal pain since Wednesday.   She was on metformin but it was stopped a couple weeks ago, daughter says that pcp took her off of it. Pt states she checks her blood sugar sometime she said she checked it a couple days ago and it was 112.   Pt states she has a red rash under her left breast she doesn't know how long. Says it just pops up.

## 2022-03-18 NOTE — Discharge Instructions (Addendum)
Please take medication as prescribed. Take with food to avoid upset stomach. Drink lots of fluids!  Please monitor for any worsening symptoms. If they occur, go to the emergency department.

## 2022-03-19 ENCOUNTER — Telehealth (HOSPITAL_COMMUNITY): Payer: Self-pay | Admitting: Emergency Medicine

## 2022-03-19 LAB — URINE CULTURE: Culture: NO GROWTH

## 2022-03-19 NOTE — Telephone Encounter (Signed)
Patient urine culture returned with no growth. Called daughter and advised to discontinue cipro. Recommend follow with PCP if symptoms persist.

## 2022-03-30 ENCOUNTER — Ambulatory Visit (INDEPENDENT_AMBULATORY_CARE_PROVIDER_SITE_OTHER): Payer: Medicare Other | Admitting: Obstetrics & Gynecology

## 2022-03-30 ENCOUNTER — Encounter: Payer: Self-pay | Admitting: Obstetrics & Gynecology

## 2022-03-30 VITALS — BP 112/80 | HR 77

## 2022-03-30 DIAGNOSIS — N898 Other specified noninflammatory disorders of vagina: Secondary | ICD-10-CM

## 2022-03-30 DIAGNOSIS — R319 Hematuria, unspecified: Secondary | ICD-10-CM

## 2022-03-30 DIAGNOSIS — B9689 Other specified bacterial agents as the cause of diseases classified elsewhere: Secondary | ICD-10-CM

## 2022-03-30 DIAGNOSIS — N760 Acute vaginitis: Secondary | ICD-10-CM | POA: Diagnosis not present

## 2022-03-30 LAB — WET PREP FOR TRICH, YEAST, CLUE

## 2022-03-30 MED ORDER — TINIDAZOLE 500 MG PO TABS: 1000.0000 mg | ORAL_TABLET | Freq: Two times a day (BID) | ORAL | 0 refills | Status: AC

## 2022-03-30 NOTE — Progress Notes (Signed)
    Leah Wiggins 24-Aug-1926 [id number]        89 y.o.  G6P6L8  RP: Counseling on small amount of blood in urine  HPI: Pt had u/a done 03-18-22 at ER & it showed small leuks & trace hgb. Culture was negative.  No impression of PMB.  No pelvic pain.     OB History  Gravida Para Term Preterm AB Living  6 6       8   SAB IAB Ectopic Multiple Live Births        2 8    # Outcome Date GA Lbr Len/2nd Weight Sex Delivery Anes PTL Lv  6A Para           6B Para           5A Para           5B Para           4 Para           3 Para           2 Para           1 Para             Past medical history,surgical history, problem list, medications, allergies, family history and social history were all reviewed and documented in the EPIC chart.   Directed ROS with pertinent positives and negatives documented in the history of present illness/assessment and plan.  Exam:  Vitals:   03/30/22 0936  BP: 112/80  Pulse: 77  SpO2: 99%   General appearance:  Normal  Abdomen: Normal  Gynecologic exam: Vulva normal.  Virgin speculum: Cervix/Vagina normal.  Mild increase in vaginal discharge.  Wet prep done.  Bimanual exam:  Uterus AV, mobile, normal volume, NT.  No adnexal mass, NT. V  U/A: Dark yellow cloudy, Pro Neg, Nit Neg, WBC 10-20, RBC 3-10, Bacteria Many.  Urine Culture pending.  Wet prep:  Clue cells present with odor   Assessment/Plan:  89 y.o. G6P6L8   1. Hematuria, unspecified type Recent U. Culture Neg 03/18/22.  U/A probably contaminated by vaginal discharge.  Will wait on U. Culture to decide if treatment is needed. - Urinalysis,Complete w/RFL Culture  2. Vaginal discharge Normal Gyn exam except for vaginal d/c.  Wet prep confirmed BV.  - WET PREP FOR TRICH, YEAST, CLUE  3. Bacterial vaginosis BV confirmed by wet prep.  Will treat with Tinidazole.  Usage reviewed and prescription sent to pharmacy.  Other orders - tinidazole (TINDAMAX) 500 MG tablet; Take 2 tablets  (1,000 mg total) by mouth 2 (two) times daily for 2 days.   Princess Bruins MD, 9:48 AM 03/30/2022

## 2022-03-31 LAB — URINALYSIS, COMPLETE W/RFL CULTURE
Bilirubin Urine: NEGATIVE
Glucose, UA: NEGATIVE
Hyaline Cast: NONE SEEN /LPF
Ketones, ur: NEGATIVE
Nitrites, Initial: NEGATIVE
Protein, ur: NEGATIVE
Specific Gravity, Urine: 1.02 (ref 1.001–1.035)
pH: 5.5 (ref 5.0–8.0)

## 2022-03-31 LAB — URINE CULTURE
MICRO NUMBER:: 14466658
Result:: NO GROWTH

## 2022-03-31 LAB — CULTURE INDICATED

## 2022-04-05 ENCOUNTER — Ambulatory Visit (INDEPENDENT_AMBULATORY_CARE_PROVIDER_SITE_OTHER): Payer: Medicare Other | Admitting: Podiatry

## 2022-04-05 DIAGNOSIS — B351 Tinea unguium: Secondary | ICD-10-CM

## 2022-04-05 DIAGNOSIS — E1122 Type 2 diabetes mellitus with diabetic chronic kidney disease: Secondary | ICD-10-CM

## 2022-04-05 DIAGNOSIS — M79609 Pain in unspecified limb: Secondary | ICD-10-CM

## 2022-04-05 DIAGNOSIS — N182 Chronic kidney disease, stage 2 (mild): Secondary | ICD-10-CM

## 2022-04-05 NOTE — Progress Notes (Addendum)
This patient returns to my office for at risk foot care.  This patient requires this care by a professional since this patient will be at risk due to having  DM  CKD and thrombocytopenia.  This patient is unable to cut nails herself since the patient cannot reach her nails.These nails are painful walking and wearing shoes.  The patient is accompanied by her daughter. This patient presents for at risk foot care today.  General Appearance  Alert, conversant and in no acute stress.  Vascular  Dorsalis pedis and posterior tibial  pulses are  weakly palpable  bilaterally.  Capillary return is within normal limits  bilaterally. Temperature is within normal limits  bilaterally.  Neurologic  Senn-Weinstein monofilament wire test within normal limits  bilaterally. Muscle power within normal limits bilaterally.  Nails Thick disfigured discolored nails with subungual debris  from hallux to fifth toes bilaterally. No evidence of bacterial infection or drainage bilaterally.  Orthopedic  No limitations of motion  feet .  No crepitus or effusions noted.  No bony pathology or digital deformities noted.  Skin  normotropic skin with no porokeratosis noted bilaterally.  HAV  B/L.    Onychomycosis  Pain in right toes  Pain in left toes  Consent was obtained for treatment procedures.   Mechanical debridement of nails 1-5  bilaterally performed with a nail nipper.  Filed with dremel without incident.    Return office visit    3 months                  Told patient to return for periodic foot care and evaluation due to potential at risk complications.   Gardiner Barefoot DPM

## 2022-05-10 ENCOUNTER — Ambulatory Visit (INDEPENDENT_AMBULATORY_CARE_PROVIDER_SITE_OTHER): Payer: Medicare Other | Admitting: Podiatry

## 2022-05-10 DIAGNOSIS — M2012 Hallux valgus (acquired), left foot: Secondary | ICD-10-CM | POA: Diagnosis not present

## 2022-05-10 DIAGNOSIS — M2041 Other hammer toe(s) (acquired), right foot: Secondary | ICD-10-CM

## 2022-05-10 DIAGNOSIS — M2042 Other hammer toe(s) (acquired), left foot: Secondary | ICD-10-CM

## 2022-05-10 DIAGNOSIS — M2011 Hallux valgus (acquired), right foot: Secondary | ICD-10-CM

## 2022-05-10 DIAGNOSIS — M21611 Bunion of right foot: Secondary | ICD-10-CM | POA: Diagnosis not present

## 2022-05-10 DIAGNOSIS — M21612 Bunion of left foot: Secondary | ICD-10-CM | POA: Diagnosis not present

## 2022-05-10 NOTE — Patient Instructions (Signed)
More silicone corn pads and toe cap pads can be purchased from:  [email or web address]   OR on [employer]

## 2022-05-11 ENCOUNTER — Encounter: Payer: Self-pay | Admitting: Podiatry

## 2022-05-11 NOTE — Progress Notes (Signed)
  Subjective:  Patient ID: Leah Wiggins, female    DOB: 1926-06-16,  MRN: [id number]  Chief Complaint  Patient presents with   Hammer Toe    right foot great toe has a knot-soreness-diabetic    89 y.o. female presents with the above complaint. History confirmed with patient.  The big toe drifts over and rubs against the second toe on both feet the right is worse than the left  Objective:  Physical Exam: warm, good capillary refill, no trophic changes or ulcerative lesions, normal DP and PT pulses, normal sensory exam, and bilaterally she has rigid hammertoe deformities and crossover deformity of the hallux with hallux valgus deformity, there is tenderness on the lateral hallux and medial second toe PIPJ Assessment:   1. Hallux valgus with bunions, right   2. Hallux valgus with bunions, left   3. Hammertoe of left foot   4. Hammertoe of right foot      Plan:  Patient was evaluated and treated and all questions answered.  We discussed etiology and treatment options of her deformity and how this relates to the painful area.  I recommend nonoperative treatment.  Silicone toe caps and digital corn pads were dispensed.  They will get these online.  Return as needed if it worsens or does not improve  Return if symptoms worsen or fail to improve.

## 2022-05-26 ENCOUNTER — Other Ambulatory Visit: Payer: Self-pay | Admitting: Internal Medicine

## 2022-06-13 ENCOUNTER — Ambulatory Visit (INDEPENDENT_AMBULATORY_CARE_PROVIDER_SITE_OTHER): Payer: Medicare Other | Admitting: Internal Medicine

## 2022-06-13 ENCOUNTER — Encounter: Payer: Self-pay | Admitting: Internal Medicine

## 2022-06-13 VITALS — BP 132/74 | HR 81 | Temp 98.7°F | Ht 60.0 in | Wt 162.6 lb

## 2022-06-13 DIAGNOSIS — E6609 Other obesity due to excess calories: Secondary | ICD-10-CM

## 2022-06-13 DIAGNOSIS — R269 Unspecified abnormalities of gait and mobility: Secondary | ICD-10-CM

## 2022-06-13 DIAGNOSIS — N1831 Chronic kidney disease, stage 3a: Secondary | ICD-10-CM

## 2022-06-13 DIAGNOSIS — I700 Atherosclerosis of aorta: Secondary | ICD-10-CM

## 2022-06-13 DIAGNOSIS — J439 Emphysema, unspecified: Secondary | ICD-10-CM | POA: Diagnosis not present

## 2022-06-13 DIAGNOSIS — Z2821 Immunization not carried out because of patient refusal: Secondary | ICD-10-CM | POA: Diagnosis not present

## 2022-06-13 DIAGNOSIS — J4489 Other specified chronic obstructive pulmonary disease: Secondary | ICD-10-CM | POA: Diagnosis not present

## 2022-06-13 DIAGNOSIS — I119 Hypertensive heart disease without heart failure: Secondary | ICD-10-CM

## 2022-06-13 DIAGNOSIS — I130 Hypertensive heart and chronic kidney disease with heart failure and stage 1 through stage 4 chronic kidney disease, or unspecified chronic kidney disease: Secondary | ICD-10-CM

## 2022-06-13 DIAGNOSIS — C3412 Malignant neoplasm of upper lobe, left bronchus or lung: Secondary | ICD-10-CM

## 2022-06-13 DIAGNOSIS — E1122 Type 2 diabetes mellitus with diabetic chronic kidney disease: Secondary | ICD-10-CM

## 2022-06-13 DIAGNOSIS — Z6831 Body mass index (BMI) 31.0-31.9, adult: Secondary | ICD-10-CM

## 2022-06-13 NOTE — Patient Instructions (Signed)

## 2022-06-13 NOTE — Progress Notes (Signed)
I,Victoria T Hamilton,acting as a scribe for Gwynneth Alimentobyn N Sydnie Sigmund, MD.,have documented all relevant documentation on the behalf of Gwynneth Alimentobyn N Quantina Dershem, MD,as directed by  Gwynneth Alimentobyn N Lycia Sachdeva, MD while in the presence of Gwynneth Alimentobyn N Riyansh Gerstner, MD.    Subjective:     Patient ID: Leah Wiggins , female    DOB: [birth year] , 89 y.o.   MRN: [id number]   Chief Complaint  Patient presents with   Diabetes   Hypertension    HPI  The patient is here today for a diabetes and blood pressure follow-up.  She is accompanied by her daughter today. She reports compliance with meds.   She denies having palpitations and chest pains. She was evaluated by RadOnc for lung CA; however, she did not receive any follow-up to start her treatments.  Additionally, she was not scheduled any f/u with Pulmonary.        Diabetes She presents for her follow-up diabetic visit. She has type 2 diabetes mellitus. There are no hypoglycemic associated symptoms. Pertinent negatives for diabetes include no blurred vision, no polydipsia and no polyphagia. There are no hypoglycemic complications. Risk factors for coronary artery disease include diabetes mellitus, dyslipidemia, hypertension, obesity, sedentary lifestyle and post-menopausal.  Hypertension This is a chronic problem. The current episode started more than 1 year ago. The problem has been gradually improving since onset. The problem is controlled. Pertinent negatives include no blurred vision. The current treatment provides moderate improvement.     Past Medical History:  Diagnosis Date   Arthritis    "shoulders" (05/04/2017)   Breast cancer, right breast 1991   GERD (gastroesophageal reflux disease)    Hard of hearing    Hyperlipidemia    Hypertension    Memory loss    Non-stress test nonreactive, 04/24/12, normal 12/10/2012   OSA on CPAP    "don't wear it all the time" (05/04/2017)   PVD (peripheral vascular disease)    Tobacco use 12/10/2012   Type II diabetes mellitus       Family History  Problem Relation Age of Onset   Stroke Mother    Cancer Sister      Current Outpatient Medications:    alendronate (FOSAMAX) 70 MG tablet, Take 1 tablet (70 mg total) by mouth once a week. TAKE 1 TABLET EVERY WEDNESDAY WITH A FULL GLASS OF WATER ON AN EMPTY STOMACH Strength: 70 mg, Disp: 12 tablet, Rfl: 3   ARTIFICIAL TEAR OP, Place 1 drop into both eyes 2 (two) times daily as needed (dry eyes)., Disp: , Rfl:    aspirin EC 81 MG tablet, Take 81 mg by mouth daily., Disp: , Rfl:    Blood Glucose Monitoring Suppl (FREESTYLE FREEDOM LITE) w/Device KIT, Use to check blood sugar 3 times a day. Dx code e11.65, Disp: 1 kit, Rfl: 3   budesonide (PULMICORT) 0.25 MG/2ML nebulizer solution, Take 2 mLs (0.25 mg total) by nebulization in the morning and at bedtime., Disp: 60 mL, Rfl: 11   FERROUS SULFATE PO, Take 2 tablets by mouth daily. gummy, Disp: , Rfl:    Fluocinolone Acetonide (DERMOTIC) 0.01 % OIL, Place 3 drops in ear(s) in the morning and at bedtime., Disp: 20 mL, Rfl: 0   formoterol (PERFOROMIST) 20 MCG/2ML nebulizer solution, Take 2 mLs (20 mcg total) by nebulization 2 (two) times daily., Disp: 60 mL, Rfl: 11   glucose blood (FREESTYLE LITE) test strip, Use to check blood sugar 3 times a day. Dx code e11.65, Disp: 150 each, Rfl: 3  Multiple Vitamins-Minerals (ALIVE WOMENS 50+ PO), Take 1 tablet by mouth daily., Disp: , Rfl:    pravastatin (PRAVACHOL) 40 MG tablet, TAKE 1 TABLET DAILY (Patient taking differently: Take 40 mg by mouth daily.), Disp: 90 tablet, Rfl: 3   Spacer/Aero-Holding Chambers (AEROCHAMBER PLUS) inhaler, Use as instructed, Disp: 1 each, Rfl: 2   telmisartan (MICARDIS) 40 MG tablet, TAKE 1 TABLET DAILY, Disp: 90 tablet, Rfl: 3   albuterol (PROVENTIL) (2.5 MG/3ML) 0.083% nebulizer solution, Take 3 mLs (2.5 mg total) by nebulization every 4 (four) hours as needed for wheezing or shortness of breath., Disp: 75 mL, Rfl: 2   Allergies  Allergen Reactions    Bactrim [Sulfamethoxazole-Trimethoprim] Swelling    Marked angioedema requiring hospitalization   Penicillins Other (See Comments)    Has patient had a PCN reaction causing immediate rash, facial/tongue/throat swelling, SOB or lightheadedness with hypotension: Unk Has patient had a PCN reaction causing severe rash involving mucus membranes or skin necrosis: Unk Has patient had a PCN reaction that required hospitalization: Unk Has patient had a PCN reaction occurring within the last 10 years: No If all of the above answers are "NO", then may proceed with Cephalosporin use.     Review of Systems  Constitutional: Negative.   HENT: Negative.    Eyes: Negative.  Negative for blurred vision.  Respiratory: Negative.    Cardiovascular: Negative.   Gastrointestinal: Negative.   Endocrine: Negative for polydipsia and polyphagia.  Skin: Negative.      Today's Vitals   06/13/22 1548  BP: 132/74  Pulse: 81  Temp: 98.7 F (37.1 C)  SpO2: 98%  Weight: 162 lb 9.6 oz (73.8 kg)  Height: 5' (1.524 m)   Body mass index is 31.76 kg/m.  Wt Readings from Last 3 Encounters:  06/17/22 161 lb 8 oz (73.3 kg)  06/13/22 162 lb 9.6 oz (73.8 kg)  03/09/22 165 lb 3.2 oz (74.9 kg)    Objective:  Physical Exam Vitals and nursing note reviewed.  Constitutional:      Appearance: Normal appearance.  HENT:     Head: Normocephalic and atraumatic.     Nose:     Comments: Masked     Mouth/Throat:     Comments: Masked  Eyes:     Extraocular Movements: Extraocular movements intact.  Cardiovascular:     Rate and Rhythm: Normal rate and regular rhythm.     Heart sounds: Normal heart sounds.  Pulmonary:     Effort: Pulmonary effort is normal.     Breath sounds: Normal breath sounds.     Comments: Decreased breath sounds b/l bases Musculoskeletal:     Cervical back: Normal range of motion.  Skin:    General: Skin is warm.  Neurological:     General: No focal deficit present.     Mental Status: She  is alert.  Psychiatric:        Mood and Affect: Mood normal.        Behavior: Behavior normal.         Assessment And Plan:     1. Type 2 diabetes mellitus with stage 3a chronic kidney disease, without long-term current use of insulin Comments: Chronic, I wil check labs as below. Does not wish to take meds, prefers to work on diet/exercise.  She is reminded to avoid NSAIDs and stay well hydrated.  She will f/u in 4 months for re-evaluation.  - CMP14+EGFR - CBC - Hemoglobin A1c  2. Hypertensive heart disease without heart failure  Comments: Chronic, fair control.  She will c/w telmisartan 40mg  daily. She is encouraged to follow low sodium diet.  3. Aortic atherosclerosis Comments: Chronic, LDL goal < 70. She will c/w pravastatin 40mg  daily. She is encouraged to follow heart healthy lifestyle.  4. Gait abnormality Comments: She is ambulatory with a cane.  Does not wish to start PT at this time.  5. Cancer of upper lobe of left lung Comments: I will contact Rad Oncology and schedule her for an appt, she should not need referral.  6. COPD with chronic bronchitis and emphysema Comments: Chronic, sx are stable. Most recent Pulmonary notes reviewed. I will refer her to Pulmonary,Dr. Celine Mans for f/u.  7. Class 1 obesity due to excess calories with serious comorbidity and body mass index (BMI) of 31.0 to 31.9 in adult Comments: She is encouraged to perform some chair exercises while watching TV.  8. Herpes zoster vaccination declined   Patient was given opportunity to ask questions. Patient verbalized understanding of the plan and was able to repeat key elements of the plan. All questions were answered to their satisfaction.   I, Gwynneth Aliment, MD, have reviewed all documentation for this visit. The documentation on 06/20/22 for the exam, diagnosis, procedures, and orders are all accurate and complete.   IF YOU HAVE BEEN REFERRED TO A SPECIALIST, IT MAY TAKE 1-2 WEEKS TO  SCHEDULE/PROCESS THE REFERRAL. IF YOU HAVE NOT HEARD FROM US/SPECIALIST IN TWO WEEKS, PLEASE GIVE [place] A CALL AT [phone].   THE PATIENT IS ENCOURAGED TO PRACTICE SOCIAL DISTANCING DUE TO THE COVID-19 PANDEMIC.

## 2022-06-14 ENCOUNTER — Telehealth: Payer: Self-pay | Admitting: Radiation Oncology

## 2022-06-14 LAB — CMP14+EGFR
ALT: 10 IU/L (ref 0–32)
AST: 17 IU/L (ref 0–40)
Albumin/Globulin Ratio: 1.2 (ref 1.2–2.2)
Albumin: 3.7 g/dL (ref 3.6–4.6)
Alkaline Phosphatase: 104 IU/L (ref 44–121)
BUN/Creatinine Ratio: 18 (ref 12–28)
BUN: 20 mg/dL (ref 10–36)
Bilirubin Total: <0.2 mg/dL (ref 0.0–1.2)
CO2: 23 mmol/L (ref 20–29)
Calcium: 9.3 mg/dL (ref 8.7–10.3)
Chloride: 104 mmol/L (ref 96–106)
Creatinine, Ser: 1.1 mg/dL — ABNORMAL HIGH (ref 0.57–1.00)
Globulin, Total: 3.1 g/dL (ref 1.5–4.5)
Glucose: 123 mg/dL — ABNORMAL HIGH (ref 70–99)
Potassium: 4.3 mmol/L (ref 3.5–5.2)
Sodium: 144 mmol/L (ref 134–144)
Total Protein: 6.8 g/dL (ref 6.0–8.5)
eGFR: 47 mL/min/{1.73_m2} — ABNORMAL LOW (ref >59)

## 2022-06-14 LAB — CBC
Hematocrit: 36.4 % (ref 34.0–46.6)
Hemoglobin: 11.4 g/dL (ref 11.1–15.9)
MCH: 22.5 pg — ABNORMAL LOW (ref 26.6–33.0)
MCHC: 31.3 g/dL — ABNORMAL LOW (ref 31.5–35.7)
MCV: 72 fL — ABNORMAL LOW (ref 79–97)
Platelets: 75 10*3/uL — CL (ref 150–450)
RBC: 5.06 x10E6/uL (ref 3.77–5.28)
RDW: 17 % — ABNORMAL HIGH (ref 11.7–15.4)
WBC: 5.7 10*3/uL (ref 3.4–10.8)

## 2022-06-14 LAB — HEMOGLOBIN A1C
Est. average glucose Bld gHb Est-mCnc: 157 mg/dL
Hgb A1c MFr Bld: 7.1 % — ABNORMAL HIGH (ref 4.8–5.6)

## 2022-06-14 NOTE — Telephone Encounter (Signed)
4/9 @ 10:32 am A referral coordinator from [facility] (patient's PCP Dr. Allyne Gee) left voicemail for patient to be schedule for radiation -per Carolann Littler.  Called/left voicemail for Dr. Kellie Moor referral Coordinator Magda Paganini) to put in referral for f/u consult with Dr. Basilio Cairo due to patient not be seen since 11/23.  Waiting on call back.

## 2022-06-15 ENCOUNTER — Telehealth: Payer: Self-pay | Admitting: Radiation Oncology

## 2022-06-15 NOTE — Telephone Encounter (Signed)
4/10 @ 9:15 am Left voicemail with Twanda (ref coord for Dr. Kellie Moor office) to let them know patient is now scheduled for Followup with Dr. Basilio Cairo following CT Sim for this Friday.

## 2022-06-15 NOTE — Telephone Encounter (Signed)
4/9 Twanda (ref coord for Dr. Allyne Gee) called back to let us know that their office will send a new referral for this patient.

## 2022-06-16 NOTE — Progress Notes (Signed)
Mrs. Bastedo presents today to discuss possible SBRT treatment to left upper lobe mass  PET Scan  01/26/2022 --IMPRESSION: Spiculated LEFT upper lobe mass with marked increased metabolic activity compatible with neoplasm, likely bronchogenic neoplasm given single lesion in the upper lobe though the patient does have a history of breast cancer. Mildly increased metabolic activity within a subcarinal lymph node, attention on follow-up, this is equivocal and warrants attention on subsequent imaging. No signs of distant metastatic disease. Pulmonary emphysema and aortic atherosclerosis.  PET Super D CT 01/26/2022 --IMPRESSION: Enlarging spiculated LEFT upper lobe mass is 3.7 cm greatest dimension as compared to 3.3 cm greatest dimension on the prior study. This tethers the adjacent pleura and shows spiculations that extend into the adjacent lung. Findings are suspicious for primary bronchogenic carcinoma given upper lobe location and spiculation. The patient does however report a history of breast cancer. Equivocal lymph nodes in the mediastinum more suspicious based on the rapid development/enlargement of this upper lobe mass. Signs of chronic pancreatitis. Post RIGHT mastectomy and axillary dissection. Calcified coronary artery disease.  Does the patient complain of any of the following: Pain: Reports an occasional pain to her hips (alternates sides), but otherwise denies any concerns Shortness of breath w/wo exertion: Denies, but does report feeling short of breath when she lies flat to sleep  Cough: Reports an occasional dry cough or congested cough related to allergies  Hemoptysis: Denies Pain with swallowing: Denies Swallowing/choking concerns: Denies Appetite: states that her appetite has waned due to food not tasting good Wt Readings from Last 3 Encounters:  06/17/22 161 lb 8 oz (73.3 kg)  06/13/22 162 lb 9.6 oz (73.8 kg)  03/09/22 165 lb 3.2 oz (74.9 kg)   Energy Level: Reports  mild fatigue

## 2022-06-16 NOTE — Progress Notes (Signed)
Radiation Oncology         [phone] ________________________________  Follow-up New Visit   Outpatient   Name: Leah Wiggins MRN: [id number]  Date: 06/17/2022  DOB: 1926/10/27  WU:[id number], Melina Schools, MD  Dorothyann Peng, MD   REFERRING PHYSICIAN: Dorothyann Peng, MD  DIAGNOSIS:    ICD-10-CM   1. Cancer of upper lobe of left lung  C34.12       Enlarging left upper lobe pulmonary nodule concerning for malignancy (non-biopsy proven)     CHIEF COMPLAINT: Here to discuss management of left lung cancer  HISTORY OF PRESENT ILLNESS::Leah Wiggins is a 89 y.o. female who returns today for CT sim and to review recent imaging prior. To review from her initial consultation on 01/25/22, we discussed the rational of proceeding with SBRT to the LUL nodule without biopsies in light of the patient's age and the associated risks that come with tissue sampling. The team arranged for a PET scan to help determine the fields and fractionation of treatment.   Subsequent PET scan on 01/26/22 demonstrated marked increased metabolic activity associated with the LUL mass compatible with neoplasm. PET also showed mildly increased metabolic activity within a subcarinal lymph node but no signs of distant metastatic disease.   She also had a super-D chest CT performed on 01/26/22 which also demonstrated enlargement of the LUL mass, measuring 3.7 cm, previously 3.3 cm on her prior study. The LUL mass also appeared to be tethered to the adjacent pleura and appear spiculated with extension into the adjacent lung. CT also redemonstrated the equivocal lymph nodes in the mediastinum which are more suspicious based on the rapid development/enlargement of this upper lobe mass.  In the interval since she was seen in consultation, the patient presented to the ED on 03/18/22 for evaluation of vaginal pain and dysuria x 2 days. She denied any vaginal bleeding or discharge. Urinalysis showed concern for UTI and the  patient was prescribed a course of cipro prophylactically pending cultures. Cultures later came back negative and the patient was instructed to DC cipro and follow-up with her PCP if her symptoms persist.   It's not clear how she was lost to f/u in our clinic - she had a PET after consult and this was going to determine if the next step was biopsy of mediastinal nodes vs SBRT.  I have recently conferred with Dr. Celine Mans and she does not recommend any biopsy of her nodes given her age. Our consensus is to proceed with SBRT.    HPI Initial Consultation - 01/25/2022 :: Leah Wiggins is a 89 y.o. female who initially presented to the ED on 06/08/21 with new onset of left sided chest pain and worsening SOB (she has SOB at baseline). Chest x-ray performed in the ED showed no acute abnormalities. However, CTA of the chest (to rule out PE) showed clustered nodular densities in the left upper lobe, though to likely reflect small airways disease or mucous plugging at the time. The largest nodule in the LUL was appreciated to measure 9 mm. No evidence of PE was appreciated.    Given her symptoms, the patient accordingly established care with Dr. Celine Mans at [facility] on 07/02/21. During this visit, the patient reported her SOB to be most prominent right before bed and right when she wakes up in the morning. She also reported taking spiriva and prn albuterol to manage this per her PCP. Moving forward, Dr. Celine Mans recommended proceeding with a follow-up chest CT in 6  months, and for her to continue using spiriva and albuterol PRN.    Follow-up chest CT on 12/13/21 showed interval enlargement of larger LUL nodule, measuring 2.9 x 2.5 cm (previously 0.9 cm), concerning for primary lung cancer vs solitary metastatic disease. CT also showed calcified granulomas in the lungs with calcified hilar and mediastinal lymph nodes.   Given her advanced age, Dr. Celine MansDesai does not recommend surgical intervention. Given so, the  patient was referred to be for consideration of empiric SBRT to the LUL nodule which we will discuss in detail today.    Dr. Celine Mansesai has scheduled her for a PET scan on 01/26/22.   Smoking history: 35 pack years, quit in 2020    ETOH use: does not drink    Previous cancers: right breast cancer diagnosed in 1991 s/p mastectomy with axillary lymphadenectomy (under Dr. Leonie ManPatrick Ballen) and tamoxifen x 5 years per Dr Asa LenteMohamed's note. She did not received any radiation. She was also previously followed by oncology for mild thrombocytopenia.    Of note: The patient is also followed by ENT for left ear pain due to bilateral impacted cerumen and left greater than right TMJ. The patient was also recently seen in the ED on 10/28/21 for evaluation of sore throat, right-sided facial swelling, runny nose and nonproductive cough x 1 day. Strep and COVID tests were negative, and she was discharged home with tessalon perles to manage her cough (discharge diagnosis: acute viral pharyngitis).      Today she reports to be doing well overall. She does notice that her breathing is worse at night, but she has been experiencing this for quite some time. She states her breathing is good throughout the day and is not on any oxygen. She does experience a chronic cough but denies any hemoptysis, unintentional weight loss, or fevers. She is present with her supportive daughter today.    PREVIOUS RADIATION THERAPY: No  PAST MEDICAL HISTORY:  has a past medical history of Arthritis, Breast cancer, right breast (1991), GERD (gastroesophageal reflux disease), Hard of hearing, Hyperlipidemia, Hypertension, Memory loss, Non-stress test nonreactive, 04/24/12, normal (12/10/2012), OSA on CPAP, PVD (peripheral vascular disease), Tobacco use (12/10/2012), and Type II diabetes mellitus.    PAST SURGICAL HISTORY: Past Surgical History:  Procedure Laterality Date   APPENDECTOMY     BIOPSY  01/19/2018   Procedure: BIOPSY;  Surgeon: Jeani HawkingHung,  Patrick, MD;  Location: [facility];  Service: Endoscopy;;   BREAST BIOPSY Right 1991   CARDIOVASCULAR STRESS TEST  04/24/2012   No significant ST segment change suggestive of ischemia.   CATARACT EXTRACTION, BILATERAL Bilateral    COLONOSCOPY WITH PROPOFOL N/A 01/19/2018   Procedure: COLONOSCOPY WITH PROPOFOL;  Surgeon: Jeani HawkingHung, Patrick, MD;  Location: [facility];  Service: Endoscopy;  Laterality: N/A;   ESOPHAGOGASTRODUODENOSCOPY (EGD) WITH PROPOFOL N/A 01/19/2018   Procedure: ESOPHAGOGASTRODUODENOSCOPY (EGD) WITH PROPOFOL;  Surgeon: Jeani HawkingHung, Patrick, MD;  Location: [facility];  Service: Endoscopy;  Laterality: N/A;   JOINT REPLACEMENT     LOWER EXTREMITY ARTERIAL DOPPLER Bilateral 05/07/2012   Left ABI-demonstrated mild arterial insufficiency. Right CIA 50-69% diameter reduction. Right SFA less than 50% diameter reduction. Left SFA 70-99% diameter reduction. Bilateral Runoff-Posterior tibial arteries appeared occluded.   MASTECTOMY PARTIAL / LUMPECTOMY W/ AXILLARY LYMPHADENECTOMY Right 1991    TOTAL HIP ARTHROPLASTY Right 2011    FAMILY HISTORY: family history includes Cancer in her sister; Stroke in her mother.  SOCIAL HISTORY:  reports that she quit smoking about 4 years ago. Her smoking use  included cigarettes. She started smoking about 75 years ago. She has a 35.00 pack-year smoking history. She has never used smokeless tobacco. She reports that she does not drink alcohol and does not use drugs.  ALLERGIES: Bactrim [sulfamethoxazole-trimethoprim] and Penicillins  MEDICATIONS:  Current Outpatient Medications  Medication Sig Dispense Refill   albuterol (PROVENTIL) (2.5 MG/3ML) 0.083% nebulizer solution Take 3 mLs (2.5 mg total) by nebulization every 4 (four) hours as needed for wheezing or shortness of breath. 75 mL 2   alendronate (FOSAMAX) 70 MG tablet Take 1 tablet (70 mg total) by mouth once a week. TAKE 1 TABLET EVERY WEDNESDAY WITH A FULL GLASS OF WATER ON AN EMPTY STOMACH Strength:  70 mg 12 tablet 3   ARTIFICIAL TEAR OP Place 1 drop into both eyes 2 (two) times daily as needed (dry eyes).     aspirin EC 81 MG tablet Take 81 mg by mouth daily.     Blood Glucose Monitoring Suppl (FREESTYLE FREEDOM LITE) w/Device KIT Use to check blood sugar 3 times a day. Dx code e11.65 1 kit 3   budesonide (PULMICORT) 0.25 MG/2ML nebulizer solution Take 2 mLs (0.25 mg total) by nebulization in the morning and at bedtime. 60 mL 11   FERROUS SULFATE PO Take 2 tablets by mouth daily. gummy     Fluocinolone Acetonide (DERMOTIC) 0.01 % OIL Place 3 drops in ear(s) in the morning and at bedtime. 20 mL 0   formoterol (PERFOROMIST) 20 MCG/2ML nebulizer solution Take 2 mLs (20 mcg total) by nebulization 2 (two) times daily. 60 mL 11   glucose blood (FREESTYLE LITE) test strip Use to check blood sugar 3 times a day. Dx code e11.65 150 each 3   Multiple Vitamins-Minerals (ALIVE WOMENS 50+ PO) Take 1 tablet by mouth daily.     pravastatin (PRAVACHOL) 40 MG tablet TAKE 1 TABLET DAILY (Patient taking differently: Take 40 mg by mouth daily.) 90 tablet 3   Spacer/Aero-Holding Chambers (AEROCHAMBER PLUS) inhaler Use as instructed 1 each 2   telmisartan (MICARDIS) 40 MG tablet TAKE 1 TABLET DAILY 90 tablet 3   No current facility-administered medications for this encounter.    REVIEW OF SYSTEMS:  Notable for that above.   PHYSICAL EXAM:  height is 5' (1.524 m) and weight is 161 lb 8 oz (73.3 kg). Her temporal temperature is 97 F (36.1 C) (abnormal). Her blood pressure is 140/61 (abnormal) and her pulse is 96. Her respiration is 18 and oxygen saturation is 99%.   General: She has evidence of cognitive decline and she is in no acute distress  HEENT: Head is normocephalic.    Heart: Regular in rate and rhythm with soft systolic murmur auscultated Chest: Clear to auscultation bilaterally, with no rhonchi, wheezes, or rales. Neurologic: She demonstrates evidence of cognitive decline and has the help of her  daughter to process information - cranial nerves II through XII are grossly intact. No obvious focalities. Speech is fluent. Coordination is intact. Psychiatric:  Affect is appropriate.   ECOG = 2  0 - Asymptomatic (Fully active, able to carry on all predisease activities without restriction)  1 - Symptomatic but completely ambulatory (Restricted in physically strenuous activity but ambulatory and able to carry out work of a light or sedentary nature. For example, light housework, office work)  2 - Symptomatic, <50% in bed during the day (Ambulatory and capable of all self care but unable to carry out any work activities. Up and about more than 50% of waking hours)  3 - Symptomatic, >50% in bed, but not bedbound (Capable of only limited self-care, confined to bed or chair 50% or more of waking hours)  4 - Bedbound (Completely disabled. Cannot carry on any self-care. Totally confined to bed or chair)  5 - Death   Santiago Glad MM, Creech RH, Tormey DC, et al. [phone]). "Toxicity and response criteria of the [facility]". Am. Evlyn Clines. Oncol. 5 (6): 649-55   LABORATORY DATA:  Lab Results  Component Value Date   WBC 5.7 06/13/2022   HGB 11.4 06/13/2022   HCT 36.4 06/13/2022   MCV 72 (L) 06/13/2022   PLT 75 (LL) 06/13/2022   CMP     Component Value Date/Time   NA 144 06/13/2022 1629   NA 142 08/14/2014 0916   K 4.3 06/13/2022 1629   K 4.1 08/14/2014 0916   CL 104 06/13/2022 1629   CL 110 (H) 07/25/2012 0849   CO2 23 06/13/2022 1629   CO2 26 08/14/2014 0916   GLUCOSE 123 (H) 06/13/2022 1629   GLUCOSE 149 (H) 06/08/2021 1757   GLUCOSE 141 (H) 08/14/2014 0916   GLUCOSE 99 07/25/2012 0849   BUN 20 06/13/2022 1629   BUN 18.0 08/14/2014 0916   CREATININE 1.10 (H) 06/13/2022 1629   CREATININE 1.0 08/14/2014 0916   CALCIUM 9.3 06/13/2022 1629   CALCIUM 9.2 08/14/2014 0916   PROT 6.8 06/13/2022 1629   PROT 6.8 08/14/2014 0916   ALBUMIN 3.7 06/13/2022 1629   ALBUMIN  3.4 (L) 08/14/2014 0916   AST 17 06/13/2022 1629   AST 18 08/14/2014 0916   ALT 10 06/13/2022 1629   ALT 11 08/14/2014 0916   ALKPHOS 104 06/13/2022 1629   ALKPHOS 79 08/14/2014 0916   BILITOT <0.2 06/13/2022 1629   BILITOT 0.28 08/14/2014 0916   GFRNONAA 49 (L) 06/08/2021 1757   GFRAA 64 01/08/2020 1614         RADIOGRAPHY:  as above   IMPRESSION/PLAN:Enlarging left upper lobe pulmonary nodule concerning for malignancy (non-biopsy proven)   Patient is a lovely 89 year old with an enlarging left upper lobe pulmonary nodule that is concerning for cancer based off of imaging and rate of growth.  PET scan reviewed with Dr Celine Mans  - we concur not to have the patient undergo nodal biopsies for nodal radiation (of equivocal nodes) given her age.  Recommendation for SBRT for local control of the left upper lung mass.  Today we again  discussed SBRT in detail. Treatment would be given every other day for 5 fractions. The goal of treatment would be curative, but no guarantees of treatment were given. The risks of treatment include rib fragility, fatigue, injury to the lung, pneumonitis, or other intrathoracic issues.     All of the patient and her daughter's questions were answered to their satisfaction.  Consent form signed and placed in the patient's chart.  She would like to proceed with treatment.     Today she will undergo CT simulation to plan RT (SBRT) to left upper lung mass, to start in 2 weeks.   On date of service, in total, I spent 30 minutes on this encounter. Patient was seen in person.   __________________________________________   Lonie Peak, MD  This document serves as a record of services personally performed by Lonie Peak, MD. It was created on her behalf by Neena Rhymes, a trained medical scribe. The creation of this record is based on the scribe's personal observations and the provider's statements to  them. This document has been checked and approved by the  attending provider.

## 2022-06-17 ENCOUNTER — Other Ambulatory Visit: Payer: Self-pay

## 2022-06-17 ENCOUNTER — Ambulatory Visit
Admission: RE | Admit: 2022-06-17 | Discharge: 2022-06-17 | Disposition: A | Discharge status: Home / Self Care | Payer: Medicare Other | Source: Ambulatory Visit | Attending: Radiation Oncology | Admitting: Radiation Oncology

## 2022-06-17 ENCOUNTER — Encounter: Payer: Self-pay | Admitting: Radiation Oncology

## 2022-06-17 VITALS — BP 140/61 | HR 96 | Temp 97.0°F | Resp 18 | Ht 60.0 in | Wt 161.5 lb

## 2022-06-17 DIAGNOSIS — I10 Essential (primary) hypertension: Secondary | ICD-10-CM | POA: Diagnosis not present

## 2022-06-17 DIAGNOSIS — K219 Gastro-esophageal reflux disease without esophagitis: Secondary | ICD-10-CM | POA: Diagnosis not present

## 2022-06-17 DIAGNOSIS — C3412 Malignant neoplasm of upper lobe, left bronchus or lung: Secondary | ICD-10-CM | POA: Diagnosis not present

## 2022-06-17 DIAGNOSIS — C7802 Secondary malignant neoplasm of left lung: Secondary | ICD-10-CM | POA: Diagnosis not present

## 2022-06-17 DIAGNOSIS — C801 Malignant (primary) neoplasm, unspecified: Secondary | ICD-10-CM | POA: Diagnosis not present

## 2022-06-17 DIAGNOSIS — Z87891 Personal history of nicotine dependence: Secondary | ICD-10-CM | POA: Insufficient documentation

## 2022-06-17 DIAGNOSIS — G4733 Obstructive sleep apnea (adult) (pediatric): Secondary | ICD-10-CM | POA: Diagnosis not present

## 2022-06-17 DIAGNOSIS — E785 Hyperlipidemia, unspecified: Secondary | ICD-10-CM | POA: Diagnosis not present

## 2022-06-17 DIAGNOSIS — E1151 Type 2 diabetes mellitus with diabetic peripheral angiopathy without gangrene: Secondary | ICD-10-CM | POA: Insufficient documentation

## 2022-06-17 DIAGNOSIS — Z79899 Other long term (current) drug therapy: Secondary | ICD-10-CM | POA: Diagnosis not present

## 2022-06-17 DIAGNOSIS — Z7951 Long term (current) use of inhaled steroids: Secondary | ICD-10-CM | POA: Diagnosis not present

## 2022-06-17 DIAGNOSIS — Z510 Encounter for antineoplastic radiation therapy: Secondary | ICD-10-CM | POA: Insufficient documentation

## 2022-06-17 DIAGNOSIS — Z7982 Long term (current) use of aspirin: Secondary | ICD-10-CM | POA: Diagnosis not present

## 2022-06-24 DIAGNOSIS — C3412 Malignant neoplasm of upper lobe, left bronchus or lung: Secondary | ICD-10-CM | POA: Diagnosis not present

## 2022-06-24 DIAGNOSIS — Z87891 Personal history of nicotine dependence: Secondary | ICD-10-CM | POA: Diagnosis not present

## 2022-06-24 DIAGNOSIS — C7802 Secondary malignant neoplasm of left lung: Secondary | ICD-10-CM | POA: Diagnosis not present

## 2022-06-24 DIAGNOSIS — Z510 Encounter for antineoplastic radiation therapy: Secondary | ICD-10-CM | POA: Diagnosis not present

## 2022-06-24 DIAGNOSIS — C801 Malignant (primary) neoplasm, unspecified: Secondary | ICD-10-CM | POA: Diagnosis not present

## 2022-06-27 ENCOUNTER — Encounter: Payer: Self-pay | Admitting: Nurse Practitioner

## 2022-06-27 ENCOUNTER — Ambulatory Visit (INDEPENDENT_AMBULATORY_CARE_PROVIDER_SITE_OTHER): Payer: Medicare Other | Admitting: Nurse Practitioner

## 2022-06-27 VITALS — BP 128/70 | HR 79 | Temp 98.4°F | Ht 61.0 in | Wt 160.6 lb

## 2022-06-27 DIAGNOSIS — J449 Chronic obstructive pulmonary disease, unspecified: Secondary | ICD-10-CM | POA: Diagnosis not present

## 2022-06-27 DIAGNOSIS — C3412 Malignant neoplasm of upper lobe, left bronchus or lung: Secondary | ICD-10-CM

## 2022-06-27 DIAGNOSIS — J439 Emphysema, unspecified: Secondary | ICD-10-CM

## 2022-06-27 DIAGNOSIS — J4489 Other specified chronic obstructive pulmonary disease: Secondary | ICD-10-CM | POA: Diagnosis not present

## 2022-06-27 NOTE — Patient Instructions (Addendum)
Continue budesonide 2 mL neb Twice daily. Brush tongue and rinse mouth afterwards Continue formoterol 2 mL neb Twice daily  Continue Albuterol inhaler 2 puffs or 3 mL neb every 6 hours as needed for shortness of breath or wheezing. Notify if symptoms persist despite rescue inhaler/neb use.   Overnight oxygen study - someone will contact you to schedule this  Next radiation oncology treatment looks like it's 4/26 at 2:15 pm  Follow up with Dr. Celine Mans in 3 months. If symptoms worsen, please contact office for sooner follow up or seek emergency care.

## 2022-06-27 NOTE — Progress Notes (Unsigned)
@Patient  ID: Leah Wiggins, female    DOB: 09-29-1926, 89 y.o.   MRN: [id number]  Chief Complaint  Patient presents with   Follow-up    Wheezing, sob, chest tightness.     Referring provider: Dorothyann Peng, MD  HPI: 89 year old female, former smoker followed for COPD and mass of LUL. She is a patient of Dr. Humphrey Rolls and last seen in office 87/10/2021.  Past medical history significant for hypertension, PAD, GERD, DM 2, RLS, IDA, HLD.  TEST/EVENTS:  12/28/2021 PFT: FVC 76, FEV1 68, ratio 65, TLC 98, DLCO 10.39 ml/min/mmHg 01/26/2022 PET scan: Spiculated left upper lobe mass with increased metabolic activity compatible with neoplasm.  Mildly increased metabolic activity within the subcarinal lymph node.  No signs of distant metastatic disease.  01/12/2022: OV with Dr. Celine Mans.  Left upper lobe mass 2.9 cm in size, concerning for primary lung malignancy.  Has PET scan ordered which is scheduled for next week.  Discussed case with advanced bronchoscopy colleagues.  Not a candidate for surgical resection given advanced age.  Most likely recommendation is empiric SBRT consideration.  Occasional shortness of breath.  Taking albuterol nebulizer treatments on days 1-2 times a day.  Has difficulty using the Respimat inhaler.  Prefers the albuterol nebs.  Continues to be mostly independent with ADLs but lives with her son.  Able to bathe and dress herself.  Started on budesonide and formoterol twice daily.  06/27/2022: Today - follow up Patient presents today for follow-up with her son.  He helps to provide some of her history.  She is a mediocre historian with some confusion at times. She had been referred to radiation oncology after her last visit due to PET avid left upper lobe lung mass.  Looks as though she was lost to follow-up with them.  She recently returned and was seen by Dr. Basilio Cairo on 06/17/2022.  They have not started her radiation treatments yet but are in the process of working her up for  this.  Feels relatively the same as she did last time.  She does feel like she gets benefit from the budesonide and formoterol nebs.  Feels like her breathing is doing okay.  She tends to feel more short of breath in the mornings.  Also takes her a while to get going.  After a little while, she does feel better and is able to go about her days and complete ADLs without any difficulty.  She does find that she occasionally has some wheezing and chest tightness but resolves pretty quickly.  Her son feels like she is at her baseline.  They have not noticed any fevers, chills, hemoptysis, significant cough.  Eating and drinking well.  No anorexia or weight loss.  Allergies  Allergen Reactions   Bactrim [Sulfamethoxazole-Trimethoprim] Swelling    Marked angioedema requiring hospitalization   Penicillins Other (See Comments)    Has patient had a PCN reaction causing immediate rash, facial/tongue/throat swelling, SOB or lightheadedness with hypotension: Unk Has patient had a PCN reaction causing severe rash involving mucus membranes or skin necrosis: Unk Has patient had a PCN reaction that required hospitalization: Unk Has patient had a PCN reaction occurring within the last 10 years: No If all of the above answers are "NO", then may proceed with Cephalosporin use.    Immunization History  Administered Date(s) Administered   Influenza Whole 02/16/2010   PFIZER(Purple Top)SARS-COV-2 Vaccination 04/29/2019, 05/20/2019, 01/09/2020, 09/22/2020   PNEUMOCOCCAL CONJUGATE-20 01/25/2021   Pneumococcal-Unspecified 06/19/2013   Tdap  01/25/2021    Past Medical History:  Diagnosis Date   Arthritis    "shoulders" (05/04/2017)   Breast cancer, right breast 1991   GERD (gastroesophageal reflux disease)    Hard of hearing    Hyperlipidemia    Hypertension    Memory loss    Non-stress test nonreactive, 04/24/12, normal 12/10/2012   OSA on CPAP    "don't wear it all the time" (05/04/2017)   PVD (peripheral  vascular disease)    Tobacco use 12/10/2012   Type II diabetes mellitus     Tobacco History: Social History   Tobacco Use  Smoking Status Former   Packs/day: 0.50   Years: 70.00   Additional pack years: 0.00   Total pack years: 35.00   Types: Cigarettes   Start date: 03/08/1947   Quit date: 2020   Years since quitting: 4.3  Smokeless Tobacco Never  Tobacco Comments   has tried cold [place]   Counseling given: Not Answered Tobacco comments: has tried cold [place]   Outpatient Medications Prior to Visit  Medication Sig Dispense Refill   alendronate (FOSAMAX) 70 MG tablet Take 1 tablet (70 mg total) by mouth once a week. TAKE 1 TABLET EVERY WEDNESDAY WITH A FULL GLASS OF WATER ON AN EMPTY STOMACH Strength: 70 mg 12 tablet 3   ARTIFICIAL TEAR OP Place 1 drop into both eyes 2 (two) times daily as needed (dry eyes).     aspirin EC 81 MG tablet Take 81 mg by mouth daily.     Blood Glucose Monitoring Suppl (FREESTYLE FREEDOM LITE) w/Device KIT Use to check blood sugar 3 times a day. Dx code e11.65 1 kit 3   budesonide (PULMICORT) 0.25 MG/2ML nebulizer solution Take 2 mLs (0.25 mg total) by nebulization in the morning and at bedtime. 60 mL 11   FERROUS SULFATE PO Take 2 tablets by mouth daily. gummy     Fluocinolone Acetonide (DERMOTIC) 0.01 % OIL Place 3 drops in ear(s) in the morning and at bedtime. 20 mL 0   formoterol (PERFOROMIST) 20 MCG/2ML nebulizer solution Take 2 mLs (20 mcg total) by nebulization 2 (two) times daily. 60 mL 11   glucose blood (FREESTYLE LITE) test strip Use to check blood sugar 3 times a day. Dx code e11.65 150 each 3   Multiple Vitamins-Minerals (ALIVE WOMENS 50+ PO) Take 1 tablet by mouth daily.     pravastatin (PRAVACHOL) 40 MG tablet TAKE 1 TABLET DAILY (Patient taking differently: Take 40 mg by mouth daily.) 90 tablet 3   Spacer/Aero-Holding Chambers (AEROCHAMBER PLUS) inhaler Use as instructed 1 each 2   telmisartan (MICARDIS) 40 MG tablet TAKE 1 TABLET  DAILY 90 tablet 3   albuterol (PROVENTIL) (2.5 MG/3ML) 0.083% nebulizer solution Take 3 mLs (2.5 mg total) by nebulization every 4 (four) hours as needed for wheezing or shortness of breath. 75 mL 2   No facility-administered medications prior to visit.     Review of Systems:   Constitutional: No weight loss or gain, night sweats, fevers, chills + fatigue (baseline) HEENT: No headaches, difficulty swallowing, tooth/dental problems, or sore throat. No sneezing, itching, ear ache, nasal congestion, or post nasal drip CV:  No chest pain, orthopnea, PND, swelling in lower extremities, anasarca, dizziness, palpitations, syncope Resp: +shortness of breath with exertion and in AM; occasional wheeze. No excess mucus or change in color of mucus. No productive or non-productive. No hemoptysis. No chest wall deformity GI:  No heartburn, indigestion, abdominal pain, nausea, vomiting, diarrhea, change  in bowel habits, loss of appetite, bloody stools.  GU: No dysuria, change in color of urine, urgency or frequency.  Skin: No rash, lesions, ulcerations MSK:  No joint pain or swelling.  Neuro: No dizziness or lightheadedness.  Psych: No depression or anxiety. Mood stable.     Physical Exam:  BP 128/70   Pulse 79   Temp 98.4 F (36.9 C) (Oral)   Ht 5\' 1"  (1.549 m)   Wt 160 lb 9.6 oz (72.8 kg)   SpO2 96%   BMI 30.35 kg/m   GEN: Pleasant, interactive, well-kempt; elderly; obese; in no acute distress. HEENT:  Normocephalic and atraumatic. PERRLA. Sclera white. Nasal turbinates pink, moist and patent bilaterally. No rhinorrhea present. Oropharynx pink and moist, without exudate or edema. No lesions, ulcerations, or postnasal drip.  NECK:  Supple w/ fair ROM. No JVD present. Normal carotid impulses w/o bruits. Thyroid symmetrical with no goiter or nodules palpated. No lymphadenopathy.   CV: RRR, no m/r/g, no peripheral edema. Pulses intact, +2 bilaterally. No cyanosis, pallor or clubbing. PULMONARY:   Unlabored, regular breathing. Diminished bases otherwise clear bilaterally A&P w/o wheezes/rales/rhonchi. No accessory muscle use.  GI: BS present and normoactive. Soft, non-tender to palpation. No organomegaly or masses detected. MSK: No erythema, warmth or tenderness. Cap refil <2 sec all extrem. No deformities or joint swelling noted.  Neuro: A/Ox3. No focal deficits noted.   Skin: Warm, no lesions or rashe Psych: Normal affect and behavior. Judgement and thought content appropriate.     Lab Results:  CBC    Component Value Date/Time   WBC 5.7 06/13/2022 1629   WBC 5.9 06/08/2021 1757   RBC 5.06 06/13/2022 1629   RBC 4.56 06/08/2021 1757   HGB 11.4 06/13/2022 1629   HGB 12.8 08/14/2014 0916   HCT 36.4 06/13/2022 1629   HCT 38.5 08/14/2014 0916   PLT 75 (LL) 06/13/2022 1629   MCV 72 (L) 06/13/2022 1629   MCV 77.5 (L) 08/14/2014 0916   MCH 22.5 (L) 06/13/2022 1629   MCH 24.8 (L) 06/08/2021 1757   MCHC 31.3 (L) 06/13/2022 1629   MCHC 31.3 06/08/2021 1757   RDW 17.0 (H) 06/13/2022 1629   RDW 16.5 (H) 08/14/2014 0916   LYMPHSABS 0.8 09/02/2020 1556   LYMPHSABS 1.9 08/14/2014 0916   MONOABS 0.7 02/11/2020 1127   MONOABS 0.5 08/14/2014 0916   EOSABS 0.1 09/02/2020 1556   BASOSABS 0.0 09/02/2020 1556   BASOSABS 0.0 08/14/2014 0916    BMET    Component Value Date/Time   NA 144 06/13/2022 1629   NA 142 08/14/2014 0916   K 4.3 06/13/2022 1629   K 4.1 08/14/2014 0916   CL 104 06/13/2022 1629   CL 110 (H) 07/25/2012 0849   CO2 23 06/13/2022 1629   CO2 26 08/14/2014 0916   GLUCOSE 123 (H) 06/13/2022 1629   GLUCOSE 149 (H) 06/08/2021 1757   GLUCOSE 141 (H) 08/14/2014 0916   GLUCOSE 99 07/25/2012 0849   BUN 20 06/13/2022 1629   BUN 18.0 08/14/2014 0916   CREATININE 1.10 (H) 06/13/2022 1629   CREATININE 1.0 08/14/2014 0916   CALCIUM 9.3 06/13/2022 1629   CALCIUM 9.2 08/14/2014 0916   GFRNONAA 49 (L) 06/08/2021 1757   GFRAA 64 01/08/2020 1614    BNP    Component  Value Date/Time   BNP 21.3 04/28/2014 1449     Imaging:  No results found.       Latest Ref Rng & Units 12/28/2021   10:56 AM  PFT Results  FVC-Pre L 1.43   FVC-Predicted Pre % 76   FVC-Post L 1.44   FVC-Predicted Post % 77   Pre FEV1/FVC % % 65   Post FEV1/FCV % % 65   FEV1-Pre L 0.93   FEV1-Predicted Pre % 68   FEV1-Post L 0.94   DLCO uncorrected ml/min/mmHg 10.39   DLCO corrected ml/min/mmHg 10.39   TLC L 4.81   TLC % Predicted % 98   RV % Predicted % 128     No results found for: "NITRICOXIDE"      Assessment & Plan:   Chronic obstructive pulmonary disease (HCC) Moderate COPD with FEV1 68%.  Appears stable on current regimen.  No recent exacerbations requiring steroids or antibiotics.  No hospitalizations. She is having some increased dyspnea in the mornings and grogginess. Possibly due to nocturnal hypoxemia or CO2 retention. Will start with ONO. If she has significant desaturations <88%, we will start her on supplemental oxygen at night and then re-evaluate her symptoms. COPD action plan in place.  Patient Instructions  Continue budesonide 2 mL neb Twice daily. Brush tongue and rinse mouth afterwards Continue formoterol 2 mL neb Twice daily  Continue Albuterol inhaler 2 puffs or 3 mL neb every 6 hours as needed for shortness of breath or wheezing. Notify if symptoms persist despite rescue inhaler/neb use.   Overnight oxygen study - someone will contact you to schedule this  Next radiation oncology treatment looks like it's 4/26 at 2:15 pm  Follow up with Dr. Celine Mans in 3 months. If symptoms worsen, please contact office for sooner follow up or seek emergency care.    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 Awaiting SBRT therapy. Follow up with Dr. Basilio Cairo as scheduled.    I spent 35 minutes of dedicated to the care of this patient on the date of this encounter to include pre-visit review of records, face-to-face time with the patient  discussing conditions above, post visit ordering of testing, clinical documentation with the electronic health record, making appropriate referrals as documented, and communicating necessary findings to members of the patients care team.  Noemi Chapel, NP 06/29/2022  Pt aware and understands NP's role.

## 2022-06-28 NOTE — Assessment & Plan Note (Signed)
PET avid LUL mass. Awaiting SBRT therapy. Follow up with Dr. Basilio Cairo as scheduled.

## 2022-06-28 NOTE — Assessment & Plan Note (Signed)
Moderate COPD with FEV1 68%.  Appears stable on current regimen.  No recent exacerbations requiring steroids or antibiotics.  No hospitalizations. She is having some increased dyspnea in the mornings and grogginess. Possibly due to nocturnal hypoxemia or CO2 retention. Will start with ONO. If she has significant desaturations <88%, we will start her on supplemental oxygen at night and then re-evaluate her symptoms. COPD action plan in place.  Patient Instructions  Continue budesonide 2 mL neb Twice daily. Brush tongue and rinse mouth afterwards Continue formoterol 2 mL neb Twice daily  Continue Albuterol inhaler 2 puffs or 3 mL neb every 6 hours as needed for shortness of breath or wheezing. Notify if symptoms persist despite rescue inhaler/neb use.   Overnight oxygen study - someone will contact you to schedule this  Next radiation oncology treatment looks like it's 4/26 at 2:15 pm  Follow up with Dr. Celine Mans in 3 months. If symptoms worsen, please contact office for sooner follow up or seek emergency care.

## 2022-06-29 ENCOUNTER — Encounter: Payer: Self-pay | Admitting: Nurse Practitioner

## 2022-07-01 ENCOUNTER — Telehealth: Payer: Self-pay | Admitting: Radiation Oncology

## 2022-07-01 ENCOUNTER — Ambulatory Visit
Admission: RE | Admit: 2022-07-01 | Discharge: 2022-07-01 | Disposition: A | Discharge status: Home / Self Care | Payer: Medicare Other | Source: Ambulatory Visit | Attending: Radiation Oncology | Admitting: Radiation Oncology

## 2022-07-01 ENCOUNTER — Other Ambulatory Visit: Payer: Self-pay

## 2022-07-01 DIAGNOSIS — C801 Malignant (primary) neoplasm, unspecified: Secondary | ICD-10-CM | POA: Diagnosis not present

## 2022-07-01 DIAGNOSIS — C7802 Secondary malignant neoplasm of left lung: Secondary | ICD-10-CM | POA: Diagnosis not present

## 2022-07-01 DIAGNOSIS — Z510 Encounter for antineoplastic radiation therapy: Secondary | ICD-10-CM | POA: Diagnosis not present

## 2022-07-01 LAB — RAD ONC ARIA SESSION SUMMARY
Course Elapsed Days: 0
Plan Fractions Treated to Date: 1
Plan Prescribed Dose Per Fraction: 12 Gy
Plan Total Fractions Prescribed: 5
Plan Total Prescribed Dose: 60 Gy
Reference Point Dosage Given to Date: 12 Gy
Reference Point Session Dosage Given: 12 Gy
Session Number: 1

## 2022-07-01 NOTE — Telephone Encounter (Signed)
4/26 @ 10:25 am Patient's daughter Silvio Pate called to cancel all patient treatments appts due to pt not remembering about what was discuss.  She agreed to keep treatment for today to speak to Dr. Basilio Cairo and RT to decide if she would go forward with her treatments or not.  Email sent to Dr. Basilio Cairo and [employer].

## 2022-07-04 ENCOUNTER — Ambulatory Visit
Admission: RE | Admit: 2022-07-04 | Discharge: 2022-07-04 | Disposition: A | Discharge status: Home / Self Care | Payer: Medicare Other | Source: Ambulatory Visit | Attending: Radiation Oncology | Admitting: Radiation Oncology

## 2022-07-04 ENCOUNTER — Ambulatory Visit: Payer: Medicare Other

## 2022-07-04 ENCOUNTER — Other Ambulatory Visit: Payer: Self-pay

## 2022-07-04 DIAGNOSIS — C801 Malignant (primary) neoplasm, unspecified: Secondary | ICD-10-CM | POA: Diagnosis not present

## 2022-07-04 DIAGNOSIS — C7802 Secondary malignant neoplasm of left lung: Secondary | ICD-10-CM | POA: Diagnosis not present

## 2022-07-04 DIAGNOSIS — Z510 Encounter for antineoplastic radiation therapy: Secondary | ICD-10-CM | POA: Diagnosis not present

## 2022-07-04 LAB — RAD ONC ARIA SESSION SUMMARY
Course Elapsed Days: 3
Plan Fractions Treated to Date: 2
Plan Prescribed Dose Per Fraction: 12 Gy
Plan Total Fractions Prescribed: 5
Plan Total Prescribed Dose: 60 Gy
Reference Point Dosage Given to Date: 24 Gy
Reference Point Session Dosage Given: 12 Gy
Session Number: 2

## 2022-07-05 ENCOUNTER — Ambulatory Visit: Payer: Medicare Other | Admitting: Podiatry

## 2022-07-06 ENCOUNTER — Other Ambulatory Visit: Payer: Self-pay

## 2022-07-06 ENCOUNTER — Ambulatory Visit
Admission: RE | Admit: 2022-07-06 | Discharge: 2022-07-06 | Disposition: A | Discharge status: Home / Self Care | Payer: Medicare Other | Source: Ambulatory Visit | Attending: Radiation Oncology | Admitting: Radiation Oncology

## 2022-07-06 ENCOUNTER — Other Ambulatory Visit: Payer: Self-pay | Admitting: Internal Medicine

## 2022-07-06 DIAGNOSIS — C7802 Secondary malignant neoplasm of left lung: Secondary | ICD-10-CM | POA: Insufficient documentation

## 2022-07-06 DIAGNOSIS — Z510 Encounter for antineoplastic radiation therapy: Secondary | ICD-10-CM | POA: Diagnosis not present

## 2022-07-06 DIAGNOSIS — C801 Malignant (primary) neoplasm, unspecified: Secondary | ICD-10-CM | POA: Diagnosis not present

## 2022-07-06 LAB — RAD ONC ARIA SESSION SUMMARY
Course Elapsed Days: 5
Plan Fractions Treated to Date: 3
Plan Prescribed Dose Per Fraction: 12 Gy
Plan Total Fractions Prescribed: 5
Plan Total Prescribed Dose: 60 Gy
Reference Point Dosage Given to Date: 36 Gy
Reference Point Session Dosage Given: 12 Gy
Session Number: 3

## 2022-07-08 ENCOUNTER — Other Ambulatory Visit: Payer: Self-pay

## 2022-07-08 ENCOUNTER — Ambulatory Visit
Admission: RE | Admit: 2022-07-08 | Discharge: 2022-07-08 | Disposition: A | Discharge status: Home / Self Care | Payer: Medicare Other | Source: Ambulatory Visit | Attending: Radiation Oncology | Admitting: Radiation Oncology

## 2022-07-08 DIAGNOSIS — C801 Malignant (primary) neoplasm, unspecified: Secondary | ICD-10-CM | POA: Diagnosis not present

## 2022-07-08 DIAGNOSIS — Z510 Encounter for antineoplastic radiation therapy: Secondary | ICD-10-CM | POA: Diagnosis not present

## 2022-07-08 DIAGNOSIS — J449 Chronic obstructive pulmonary disease, unspecified: Secondary | ICD-10-CM | POA: Diagnosis not present

## 2022-07-08 DIAGNOSIS — C7802 Secondary malignant neoplasm of left lung: Secondary | ICD-10-CM | POA: Diagnosis not present

## 2022-07-08 LAB — RAD ONC ARIA SESSION SUMMARY
Course Elapsed Days: 7
Plan Fractions Treated to Date: 4
Plan Prescribed Dose Per Fraction: 12 Gy
Plan Total Fractions Prescribed: 5
Plan Total Prescribed Dose: 60 Gy
Reference Point Dosage Given to Date: 48 Gy
Reference Point Session Dosage Given: 12 Gy
Session Number: 4

## 2022-07-11 ENCOUNTER — Ambulatory Visit
Admission: RE | Admit: 2022-07-11 | Discharge: 2022-07-11 | Disposition: A | Discharge status: Home / Self Care | Payer: Medicare Other | Source: Ambulatory Visit | Attending: Radiation Oncology | Admitting: Radiation Oncology

## 2022-07-11 ENCOUNTER — Ambulatory Visit: Payer: Medicare Other

## 2022-07-11 ENCOUNTER — Other Ambulatory Visit: Payer: Self-pay

## 2022-07-11 DIAGNOSIS — C801 Malignant (primary) neoplasm, unspecified: Secondary | ICD-10-CM | POA: Diagnosis not present

## 2022-07-11 DIAGNOSIS — Z510 Encounter for antineoplastic radiation therapy: Secondary | ICD-10-CM | POA: Diagnosis not present

## 2022-07-11 DIAGNOSIS — Z87891 Personal history of nicotine dependence: Secondary | ICD-10-CM | POA: Diagnosis not present

## 2022-07-11 DIAGNOSIS — C3412 Malignant neoplasm of upper lobe, left bronchus or lung: Secondary | ICD-10-CM | POA: Diagnosis not present

## 2022-07-11 DIAGNOSIS — C7802 Secondary malignant neoplasm of left lung: Secondary | ICD-10-CM | POA: Diagnosis not present

## 2022-07-11 LAB — RAD ONC ARIA SESSION SUMMARY
Course Elapsed Days: 10
Plan Fractions Treated to Date: 5
Plan Prescribed Dose Per Fraction: 12 Gy
Plan Total Fractions Prescribed: 5
Plan Total Prescribed Dose: 60 Gy
Reference Point Dosage Given to Date: 60 Gy
Reference Point Session Dosage Given: 12 Gy
Session Number: 5

## 2022-07-12 ENCOUNTER — Ambulatory Visit: Payer: Medicare Other

## 2022-07-12 ENCOUNTER — Other Ambulatory Visit: Payer: Self-pay

## 2022-07-12 DIAGNOSIS — C3492 Malignant neoplasm of unspecified part of left bronchus or lung: Secondary | ICD-10-CM

## 2022-07-12 NOTE — Progress Notes (Signed)
Ct Chest with no contrast ordered for mid August per Dr. Basilio Cairo request.

## 2022-07-13 ENCOUNTER — Telehealth: Payer: Self-pay | Admitting: Nurse Practitioner

## 2022-07-14 NOTE — Radiation Completion Notes (Signed)
Patient Name: Leah Wiggins, Leah Wiggins MRN: [id number] Date of Birth: 1927-02-12 Referring Physician: Dorothyann Peng, M.D. Date of Service: 2022-07-14 Radiation Oncologist: Lonie Peak, M.D. [facility]                             RADIATION ONCOLOGY END OF TREATMENT NOTE     Diagnosis: C34.12 Malignant neoplasm of upper lobe, left bronchus or lung Intent: Curative     ==========DELIVERED PLANS==========  First Treatment Date: 2022-07-01 - Last Treatment Date: 2022-07-11   Plan Name: Lung_Lt_SBRT Site: Lung, Left Technique: SBRT/SRT-IMRT Mode: Photon Dose Per Fraction: 12 Gy Prescribed Dose (Delivered / Prescribed): 60 Gy / 60 Gy Prescribed Fxs (Delivered / Prescribed): 5 / 5     ==========ON TREATMENT VISIT DATES========== 2022-07-01, 2022-07-01, 2022-07-04, 2022-07-06, 2022-07-08, 2022-07-11, 2022-07-11     ==========UPCOMING VISITS==========       ==========APPENDIX - ON TREATMENT VISIT NOTES==========   See weekly On Treatment Notes is Epic for details.

## 2022-07-14 NOTE — Telephone Encounter (Signed)
Noemi Chapel, NP  to Me      07/13/22  5:23 PM ONO 07/07/2022 with 6 min 52 sec <88% and SpO2 low 85%, which does qualify her for nocturnal oxygen. She only tested for 2 hours so would consider repeat testing on room air if she wants to avoid oxygen therapy at night. Thanks.  I called the pt and there was no answer- LMTCB.

## 2022-07-21 NOTE — Telephone Encounter (Signed)
Called the pt and there was no answer- LMTCB    

## 2022-07-22 ENCOUNTER — Other Ambulatory Visit: Payer: Self-pay

## 2022-07-22 DIAGNOSIS — J439 Emphysema, unspecified: Secondary | ICD-10-CM

## 2022-07-22 NOTE — Telephone Encounter (Signed)
Called the pt and there was no answer- LMTCB    

## 2022-07-22 NOTE — Telephone Encounter (Signed)
Spoke with patients daughter. Went over [employer]. She requests to have ONO repeated. I have placed orders. Routing to [employer] as an [employer]

## 2022-07-29 NOTE — Progress Notes (Signed)
Leah Wiggins was called today for follow up for completion of radiation of therapy for left lung cancer. She completed treatment on 07-11-22.  PAIN: She is currently in no pain.   RESPIRATORY: None. Pt is on room air. Noted: no skin issues to note  SWALLOWING/DIET: Pt denies dysphagia  The patient eats a regular, healthy diet..  OTHER: Pt daughter denies weakness, fatigue or mobility issues at this time. Pt daughter states she is eating well and enjoys going to the grocery store with her family. Pt and her family know to call with any questions or concerns.   There were no vitals taken for this visit.   Wt Readings from Last 3 Encounters:  06/27/22 160 lb 9.6 oz (72.8 kg)  06/17/22 161 lb 8 oz (73.3 kg)  06/13/22 162 lb 9.6 oz (73.8 kg)

## 2022-08-11 ENCOUNTER — Telehealth: Payer: Self-pay

## 2022-08-11 ENCOUNTER — Ambulatory Visit
Admission: RE | Admit: 2022-08-11 | Discharge: 2022-08-11 | Disposition: A | Discharge status: Home / Self Care | Payer: Medicare Other | Source: Ambulatory Visit | Attending: Radiation Oncology | Admitting: Radiation Oncology

## 2022-08-11 ENCOUNTER — Ambulatory Visit: Payer: Medicare Other | Admitting: Radiation Oncology

## 2022-08-11 ENCOUNTER — Encounter: Payer: Self-pay | Admitting: Radiation Oncology

## 2022-08-11 NOTE — Telephone Encounter (Signed)
Rn called pt daughter for update on her mothers condition after radiation. Pt daughter stated she is doing well with no needs. Follow up note completed and routed to Dr. Basilio Cairo.

## 2022-08-12 ENCOUNTER — Ambulatory Visit: Payer: Self-pay | Admitting: Radiation Oncology

## 2022-09-10 ENCOUNTER — Other Ambulatory Visit: Payer: Self-pay

## 2022-09-10 ENCOUNTER — Ambulatory Visit (HOSPITAL_COMMUNITY)
Admission: EM | Admit: 2022-09-10 | Discharge: 2022-09-10 | Disposition: A | Discharge status: Home / Self Care | Payer: Medicare Other | Attending: Emergency Medicine | Admitting: Emergency Medicine

## 2022-09-10 ENCOUNTER — Encounter (HOSPITAL_COMMUNITY): Payer: Self-pay | Admitting: *Deleted

## 2022-09-10 DIAGNOSIS — S99921A Unspecified injury of right foot, initial encounter: Secondary | ICD-10-CM

## 2022-09-10 MED ORDER — MUPIROCIN 2 % EX OINT: 1.0000 | TOPICAL_OINTMENT | Freq: Two times a day (BID) | CUTANEOUS | 0 refills | Status: AC

## 2022-09-10 NOTE — ED Triage Notes (Signed)
Family reports Pt's toe started to bleed at approx 3PM today. Pt is bleeding on the 4th RT toe. Pt may have tried to cut her toe nail .

## 2022-09-10 NOTE — Discharge Instructions (Addendum)
Her bleeding is controlled in clinic.  We have cleaned her toe and will apply antibacterial ointment with the dressing. Please keep the area clean and dry, you can apply antibacterial ointment twice daily.  I suggest following up with podiatry sometime next week for reevaluation.  Please do not try to trim your own nails at home, as this can cause injury.  Return to clinic if you develop any toe swelling, increasing toe pain, fever, or any new concerning symptoms.

## 2022-09-10 NOTE — ED Provider Notes (Addendum)
[facility]    CSN: [id number] Arrival date & time: 09/10/22  1712      History   Chief Complaint Chief Complaint  Patient presents with   Toe Injury    HPI Leah Wiggins is a 89 y.o. female.   Patient presents to clinic with a relative over continued bleeding of her right fourth toe for the past hour or so.  Patient overall is a poor historian, may have cut her toenail, is unsure. Denies trauma or falls.  Area without swelling.  She does go to podiatry to get her nails trimmed.  Hx significant for HTN, memory loss, HLD, PVD and DM2.  The history is provided by the patient and a relative.    Past Medical History:  Diagnosis Date   Arthritis    "shoulders" (05/04/2017)   Breast cancer, right breast (HCC) 1991   GERD (gastroesophageal reflux disease)    Hard of hearing    Hyperlipidemia    Hypertension    Memory loss    Non-stress test nonreactive, 04/24/12, normal 12/10/2012   OSA on CPAP    "don't wear it all the time" (05/04/2017)   PVD (peripheral vascular disease) (HCC)    Tobacco use 12/10/2012   Type II diabetes mellitus (HCC)     Patient Active Problem List   Diagnosis Date Noted   Mass of left lung 02/21/2022   Gait abnormality 02/21/2022   Overweight with body mass index (BMI) of 29 to 29.9 in adult 02/21/2022   Cancer of upper lobe of left lung (HCC) 01/26/2022   Hypertensive heart and chronic kidney disease with heart failure and stage 1 through stage 4 chronic kidney disease, or unspecified chronic kidney disease (HCC) 09/03/2020   Type 2 diabetes mellitus with stage 3a chronic kidney disease, without long-term current use of insulin (HCC) 09/03/2020   Atherosclerosis of aorta (HCC) 09/03/2020   Absolute anemia 09/03/2020   Cellulitis of left foot 09/03/2020   Class 1 obesity due to excess calories with serious comorbidity and body mass index (BMI) of 33.0 to 33.9 in adult 09/03/2020   Abnormal feces 06/04/2020   Abnormal weight loss  06/04/2020   Angiodysplasia of intestine 06/04/2020   Colon cancer screening 06/04/2020   Gastroesophageal reflux disease 06/04/2020   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 hearing of right ear 06/04/2020   Prolapsed internal hemorrhoids 06/04/2020   Rectal bleeding 06/04/2020   Sensorineural hearing loss (SNHL) of right ear with restricted hearing of left ear 06/04/2020   Mild cognitive impairment 12/23/2019   Memory loss 06/24/2019   Intrinsic atopic dermatitis 09/18/2018   Chronic obstructive pulmonary disease (HCC) 04/06/2018   Prediabetes 02/07/2018   Iron deficiency anemia due to chronic blood loss 12/25/2017   Anaphylaxis 05/04/2017   Rash and nonspecific skin eruption 05/04/2017   History of breast cancer 08/14/2014   Empty sella (HCC) 07/14/2014   Pain in the chest 04/28/2014   Diabetes mellitus without complication (HCC) 03/03/2014   Chest pain 03/03/2014   Essential hypertension 06/24/2013   PAD (peripheral artery disease), abnormal ABIs 12/10/2012   Non-stress test nonreactive, 04/24/12, normal 12/10/2012   Dyslipidemia 12/10/2012   Tobacco use 12/10/2012   Thrombocytopenia (HCC) 01/24/2012   RESTLESS LEG SYNDROME 02/16/2010   METHICILLIN SUSECPTIBLE PNEUMONIA STAPH AUREUS 01/13/2010   HIP THI LEG&ANK ABRASION/FRICION BURN W/O INF 01/13/2010    Past Surgical History:  Procedure Laterality Date   APPENDECTOMY     BIOPSY  01/19/2018  Procedure: BIOPSY;  Surgeon: Jeani Hawking, MD;  Location: [facility];  Service: Endoscopy;;   BREAST BIOPSY Right 1991   CARDIOVASCULAR STRESS TEST  04/24/2012   No significant ST segment change suggestive of ischemia.   CATARACT EXTRACTION, BILATERAL Bilateral    COLONOSCOPY WITH PROPOFOL N/A 01/19/2018   Procedure: COLONOSCOPY WITH PROPOFOL;  Surgeon: Jeani Hawking, MD;  Location: [facility];  Service: Endoscopy;  Laterality: N/A;   ESOPHAGOGASTRODUODENOSCOPY (EGD) WITH PROPOFOL N/A  01/19/2018   Procedure: ESOPHAGOGASTRODUODENOSCOPY (EGD) WITH PROPOFOL;  Surgeon: Jeani Hawking, MD;  Location: [facility];  Service: Endoscopy;  Laterality: N/A;   JOINT REPLACEMENT     LOWER EXTREMITY ARTERIAL DOPPLER Bilateral 05/07/2012   Left ABI-demonstrated mild arterial insufficiency. Right CIA 50-69% diameter reduction. Right SFA less than 50% diameter reduction. Left SFA 70-99% diameter reduction. Bilateral Runoff-Posterior tibial arteries appeared occluded.   MASTECTOMY PARTIAL / LUMPECTOMY W/ AXILLARY LYMPHADENECTOMY Right 1991    TOTAL HIP ARTHROPLASTY Right 2011    OB History     Gravida  6   Para  6   Term      Preterm      AB      Living  8      SAB      IAB      Ectopic      Multiple  2   Live Births  8            Home Medications    Prior to Admission medications   Medication Sig Start Date End Date Taking? Authorizing Provider  alendronate (FOSAMAX) 70 MG tablet TAKE 1 TABLET EVERY WEDNESDAY WITH A FULL GLASS OF WATER ON AN EMPTY STOMACH 07/07/22  Yes Dorothyann Peng, MD  ARTIFICIAL TEAR OP Place 1 drop into both eyes 2 (two) times daily as needed (dry eyes).   Yes [provider]  aspirin EC 81 MG tablet Take 81 mg by mouth daily.   Yes [provider]  Blood Glucose Monitoring Suppl (FREESTYLE FREEDOM LITE) w/Device KIT Use to check blood sugar 3 times a day. Dx code e11.65 09/18/19  Yes Dorothyann Peng, MD  budesonide (PULMICORT) 0.25 MG/2ML nebulizer solution Take 2 mLs (0.25 mg total) by nebulization in the morning and at bedtime. 12/27/21  Yes Charlott Holler, MD  FERROUS SULFATE PO Take 2 tablets by mouth daily. gummy   Yes [provider]  Fluocinolone Acetonide (DERMOTIC) 0.01 % OIL Place 3 drops in ear(s) in the morning and at bedtime. 09/18/21  Yes Rising, Rebecca, PA-C  formoterol (PERFOROMIST) 20 MCG/2ML nebulizer solution Take 2 mLs (20 mcg total) by nebulization 2 (two) times daily. 12/27/21  Yes Charlott Holler, MD  Multiple Vitamins-Minerals (ALIVE WOMENS 50+ PO) Take 1 tablet by mouth daily.   Yes [provider]  mupirocin ointment (BACTROBAN) 2 % Apply 1 Application topically 2 (two) times daily. 09/10/22  Yes Rinaldo Ratel, [place], FNP  pravastatin (PRAVACHOL) 40 MG tablet Take 1 tablet (40 mg total) by mouth daily. 07/07/22  Yes Dorothyann Peng, MD  Spacer/Aero-Holding Chambers (AEROCHAMBER PLUS) inhaler Use as instructed 08/19/19  Yes Dorothyann Peng, MD  telmisartan (MICARDIS) 40 MG tablet TAKE 1 TABLET DAILY 05/27/22  Yes Dorothyann Peng, MD  albuterol (PROVENTIL) (2.5 MG/3ML) 0.083% nebulizer solution Take 3 mLs (2.5 mg total) by nebulization every 4 (four) hours as needed for wheezing or shortness of breath. 08/06/20 03/30/22  Dorothyann Peng, MD  glucose blood (FREESTYLE LITE) test strip Use to check blood sugar  3 times a day. Dx code e11.65 01/10/20   Dorothyann Peng, MD    Family History Family History  Problem Relation Age of Onset   Stroke Mother    Cancer Sister     Social History Social History   Tobacco Use   Smoking status: Former    Packs/day: 0.50    Years: 70.00    Additional pack years: 0.00    Total pack years: 35.00    Types: Cigarettes    Start date: 03/08/1947    Quit date: 2020    Years since quitting: 4.5   Smokeless tobacco: Never   Tobacco comments:    has tried cold [place]  Vaping Use   Vaping Use: Never used  Substance Use Topics   Alcohol use: No    Alcohol/week: 0.0 standard drinks of alcohol   Drug use: No     Allergies   Bactrim [sulfamethoxazole-trimethoprim] and Penicillins   Review of Systems Review of Systems   Physical Exam Triage Vital Signs ED Triage Vitals  Enc Vitals Group     BP 09/10/22 1721 (!) 176/61     Pulse Rate 09/10/22 1721 75     Resp 09/10/22 1721 20     Temp 09/10/22 1721 98.8 F (37.1 C)     Temp src --      SpO2 09/10/22 1721 98 %     Weight --      Height --      Head Circumference --      Peak Flow --       Pain Score 09/10/22 1727 0     Pain Loc --      Pain Edu? --      Excl. in GC? --    No data found.  Updated Vital Signs BP (!) 176/61   Pulse 75   Temp 98.8 F (37.1 C)   Resp 20   SpO2 98%   Visual Acuity Right Eye Distance:   Left Eye Distance:   Bilateral Distance:    Right Eye Near:   Left Eye Near:    Bilateral Near:     Physical Exam Vitals and nursing note reviewed.  Constitutional:      Appearance: Normal appearance.  HENT:     Head: Normocephalic and atraumatic.     Right Ear: External ear normal.     Left Ear: External ear normal.     Nose: Nose normal.     Mouth/Throat:     Mouth: Mucous membranes are moist.  Eyes:     Conjunctiva/sclera: Conjunctivae normal.  Cardiovascular:     Rate and Rhythm: Normal rate.     Pulses:          Dorsalis pedis pulses are 2+ on the right side.       Posterior tibial pulses are 2+ on the right side.  Pulmonary:     Effort: Pulmonary effort is normal. No respiratory distress.  Musculoskeletal:       Feet:  Feet:     Comments: The distal and of the toenail appears to have been removed from patient's right fourth toe.  Bleeding is controlled in clinic.  No obvious trauma, tenderness, deformity or swelling. Skin:    General: Skin is warm and dry.     Capillary Refill: Capillary refill takes less than 2 seconds.  Neurological:     General: No focal deficit present.     Mental Status: She is alert.  Psychiatric:  Mood and Affect: Mood normal.        Behavior: Behavior is cooperative.      UC Treatments / Results  Labs (all labs ordered are listed, but only abnormal results are displayed) Labs Reviewed - No data to display  EKG   Radiology No results found.  Procedures Procedures (including critical care time)  Medications Ordered in UC Medications - No data to display  Initial Impression / Assessment and Plan / UC Course  I have reviewed the triage vital signs and the nursing  notes.  Pertinent labs & imaging results that were available during my care of the patient were reviewed by me and considered in my medical decision making (see chart for details).  Vitals and triage reviewed, patient is hemodynamically stable.  Patient with bleeding to right foot distal end of fourth toe, distal free edge of nail appears to be removed.  Bleeding is controlled in clinic.  Cleaned with wound spray and antibacterial solution applied.  Gauze dressing done by nursing staff.  No obvious infection or trauma.  Range of motion and capillary refill intact.  Sensation intact.  Advised to follow-up with podiatry next week for reevaluation to ensure proper healing.  Plan of care, follow-up care and return precautions given, no questions at this time.     Final Clinical Impressions(s) / UC Diagnoses   Final diagnoses:  Toe injury, right, initial encounter     Discharge Instructions      Her bleeding is controlled in clinic.  We have cleaned her toe and will apply antibacterial ointment with the dressing. Please keep the area clean and dry, you can apply antibacterial ointment twice daily.  I suggest following up with podiatry sometime next week for reevaluation.  Please do not try to trim your own nails at home, as this can cause injury.  Return to clinic if you develop any toe swelling, increasing toe pain, fever, or any new concerning symptoms.    ED Prescriptions     Medication Sig Dispense Auth. Provider   mupirocin ointment (BACTROBAN) 2 % Apply 1 Application topically 2 (two) times daily. 22 g Shawna Kiener, [place], [place]      PDMP not reviewed this encounter.     Maurissa Ambrose, [place], [place] 09/10/22 1745

## 2022-09-25 ENCOUNTER — Ambulatory Visit (HOSPITAL_COMMUNITY)
Admission: RE | Admit: 2022-09-25 | Discharge: 2022-09-25 | Disposition: A | Discharge status: Home / Self Care | Payer: Medicare Other | Source: Ambulatory Visit | Attending: Emergency Medicine | Admitting: Emergency Medicine

## 2022-09-25 ENCOUNTER — Encounter (HOSPITAL_COMMUNITY): Payer: Self-pay

## 2022-09-25 VITALS — BP 114/66 | HR 72 | Temp 98.5°F | Resp 19

## 2022-09-25 DIAGNOSIS — R21 Rash and other nonspecific skin eruption: Secondary | ICD-10-CM | POA: Diagnosis not present

## 2022-09-25 DIAGNOSIS — T7840XA Allergy, unspecified, initial encounter: Secondary | ICD-10-CM

## 2022-09-25 MED ORDER — TRIAMCINOLONE ACETONIDE 0.1 % EX CREA: 1.0000 | TOPICAL_CREAM | Freq: Two times a day (BID) | CUTANEOUS | 0 refills | Status: AC

## 2022-09-25 MED ORDER — DEXAMETHASONE SODIUM PHOSPHATE 10 MG/ML IJ SOLN
10.0000 mg | Freq: Once | INTRAMUSCULAR | Status: AC
  Administered 2022-09-25: 10 mg via INTRAMUSCULAR

## 2022-09-25 MED ORDER — DEXAMETHASONE SODIUM PHOSPHATE 10 MG/ML IJ SOLN
INTRAMUSCULAR | Status: AC
  Filled 2022-09-25: qty 1

## 2022-09-25 NOTE — Discharge Instructions (Signed)
You are having an allergic reaction, it is unclear what caused this.  We have given you a steroid injection today in clinic to help with your swelling, itching and rash.  You can apply the topical steroid ointment twice daily, do not use this for longer than 7 days because it may cause skin thinning.  For itching you can take 25 mg of Benadryl every 6-8 hours.  Please do not use the [place] scented laundry detergent, use unscented and hypoallergenic detergent, soaps and lotions.  Seek immediate care if you develop shortness of breath, trouble swallowing, or worsening of the rash.

## 2022-09-25 NOTE — ED Triage Notes (Signed)
Pt had itching since Wed. Arms, legs, neck, face chest is where itching has been. Family put cream on area to help. Saturday ws c/o itching and was given a wash up but didn't but put any more cream. Family tried Benadryl cream yesterday and table t for the itching. Today woke up with swelling to face.    Pt adds that left arm hurts.

## 2022-09-25 NOTE — ED Provider Notes (Signed)
[facility]    CSN: [id number] Arrival date & time: 09/25/22  1235      History   Chief Complaint Chief Complaint  Patient presents with   Allergic Reaction    Swollen face /hive or bumps / started about maybe Thursday was not bad .. this morning it flared up... - Entered by patient    HPI ENIOLA CERULLO is a 89 y.o. female.   Patient brought into clinic by one of her daughters for concerns of worsening allergic reaction.  Patient's rash started Wednesday with itching to her arms, legs, neck and face.  Family put topical Benadryl cream and a topical steroid cream to help. Did take 25 mg of Benadryl last night.  Has been bathing with Aveeno and Dove soap.  Thinks maybe her family washer clothes in Gain, she does not react well to this. Daughter has washed sheets and linens in hot water w/ unscented detergent since rash started.   Rash is worsened to her face, arms and neck.  Denies any trouble swallowing, wheezing or shortness of breath.  She has rotating caregivers throughout the week.  Daughter is concerned for poison ivy, there is poison ivy records out when one of her fences.     The history is provided by the patient, medical records and a caregiver.  Allergic Reaction Presenting symptoms: rash     Past Medical History:  Diagnosis Date   Arthritis    "shoulders" (05/04/2017)   Breast cancer, right breast (HCC) 1991   GERD (gastroesophageal reflux disease)    Hard of hearing    Hyperlipidemia    Hypertension    Memory loss    Non-stress test nonreactive, 04/24/12, normal 12/10/2012   OSA on CPAP    "don't wear it all the time" (05/04/2017)   PVD (peripheral vascular disease) (HCC)    Tobacco use 12/10/2012   Type II diabetes mellitus (HCC)     Patient Active Problem List   Diagnosis Date Noted   Mass of left lung 02/21/2022   Gait abnormality 02/21/2022   Overweight with body mass index (BMI) of 29 to 29.9 in adult 02/21/2022   Cancer of upper  lobe of left lung (HCC) 01/26/2022   Hypertensive heart and chronic kidney disease with heart failure and stage 1 through stage 4 chronic kidney disease, or unspecified chronic kidney disease (HCC) 09/03/2020   Type 2 diabetes mellitus with stage 3a chronic kidney disease, without long-term current use of insulin (HCC) 09/03/2020   Atherosclerosis of aorta (HCC) 09/03/2020   Absolute anemia 09/03/2020   Cellulitis of left foot 09/03/2020   Class 1 obesity due to excess calories with serious comorbidity and body mass index (BMI) of 33.0 to 33.9 in adult 09/03/2020   Abnormal feces 06/04/2020   Abnormal weight loss 06/04/2020   Angiodysplasia of intestine 06/04/2020   Colon cancer screening 06/04/2020   Gastroesophageal reflux disease 06/04/2020   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 hearing of right ear 06/04/2020   Prolapsed internal hemorrhoids 06/04/2020   Rectal bleeding 06/04/2020   Sensorineural hearing loss (SNHL) of right ear with restricted hearing of left ear 06/04/2020   Mild cognitive impairment 12/23/2019   Memory loss 06/24/2019   Intrinsic atopic dermatitis 09/18/2018   Chronic obstructive pulmonary disease (HCC) 04/06/2018   Prediabetes 02/07/2018   Iron deficiency anemia due to chronic blood loss 12/25/2017   Anaphylaxis 05/04/2017   Rash and nonspecific skin eruption 05/04/2017   History  of breast cancer 08/14/2014   Empty sella (HCC) 07/14/2014   Pain in the chest 04/28/2014   Diabetes mellitus without complication (HCC) 03/03/2014   Chest pain 03/03/2014   Essential hypertension 06/24/2013   PAD (peripheral artery disease), abnormal ABIs 12/10/2012   Non-stress test nonreactive, 04/24/12, normal 12/10/2012   Dyslipidemia 12/10/2012   Tobacco use 12/10/2012   Thrombocytopenia (HCC) 01/24/2012   RESTLESS LEG SYNDROME 02/16/2010   METHICILLIN SUSECPTIBLE PNEUMONIA STAPH AUREUS 01/13/2010   HIP THI LEG&ANK ABRASION/FRICION BURN  W/O INF 01/13/2010    Past Surgical History:  Procedure Laterality Date   APPENDECTOMY     BIOPSY  01/19/2018   Procedure: BIOPSY;  Surgeon: Jeani Hawking, MD;  Location: [facility];  Service: Endoscopy;;   BREAST BIOPSY Right 1991   CARDIOVASCULAR STRESS TEST  04/24/2012   No significant ST segment change suggestive of ischemia.   CATARACT EXTRACTION, BILATERAL Bilateral    COLONOSCOPY WITH PROPOFOL N/A 01/19/2018   Procedure: COLONOSCOPY WITH PROPOFOL;  Surgeon: Jeani Hawking, MD;  Location: [facility];  Service: Endoscopy;  Laterality: N/A;   ESOPHAGOGASTRODUODENOSCOPY (EGD) WITH PROPOFOL N/A 01/19/2018   Procedure: ESOPHAGOGASTRODUODENOSCOPY (EGD) WITH PROPOFOL;  Surgeon: Jeani Hawking, MD;  Location: [facility];  Service: Endoscopy;  Laterality: N/A;   JOINT REPLACEMENT     LOWER EXTREMITY ARTERIAL DOPPLER Bilateral 05/07/2012   Left ABI-demonstrated mild arterial insufficiency. Right CIA 50-69% diameter reduction. Right SFA less than 50% diameter reduction. Left SFA 70-99% diameter reduction. Bilateral Runoff-Posterior tibial arteries appeared occluded.   MASTECTOMY PARTIAL / LUMPECTOMY W/ AXILLARY LYMPHADENECTOMY Right 1991    TOTAL HIP ARTHROPLASTY Right 2011    OB History     Gravida  6   Para  6   Term      Preterm      AB      Living  8      SAB      IAB      Ectopic      Multiple  2   Live Births  8            Home Medications    Prior to Admission medications   Medication Sig Start Date End Date Taking? Authorizing Provider  triamcinolone cream (KENALOG) 0.1 % Apply 1 Application topically 2 (two) times daily. 09/25/22  Yes Rinaldo Ratel, [place], FNP  albuterol (PROVENTIL) (2.5 MG/3ML) 0.083% nebulizer solution Take 3 mLs (2.5 mg total) by nebulization every 4 (four) hours as needed for wheezing or shortness of breath. 08/06/20 03/30/22  Dorothyann Peng, MD  alendronate (FOSAMAX) 70 MG tablet TAKE 1 TABLET EVERY WEDNESDAY WITH A FULL GLASS OF  WATER ON AN EMPTY STOMACH 07/07/22   Dorothyann Peng, MD  ARTIFICIAL TEAR OP Place 1 drop into both eyes 2 (two) times daily as needed (dry eyes).    [provider]  aspirin EC 81 MG tablet Take 81 mg by mouth daily.    [provider]  Blood Glucose Monitoring Suppl (FREESTYLE FREEDOM LITE) w/Device KIT Use to check blood sugar 3 times a day. Dx code e11.65 09/18/19   Dorothyann Peng, MD  budesonide (PULMICORT) 0.25 MG/2ML nebulizer solution Take 2 mLs (0.25 mg total) by nebulization in the morning and at bedtime. 12/27/21   Charlott Holler, MD  FERROUS SULFATE PO Take 2 tablets by mouth daily. gummy    [provider]  Fluocinolone Acetonide (DERMOTIC) 0.01 % OIL Place 3 drops in ear(s) in the morning and at bedtime. 09/18/21  Rising, Rebecca, PA-C  formoterol (PERFOROMIST) 20 MCG/2ML nebulizer solution Take 2 mLs (20 mcg total) by nebulization 2 (two) times daily. 12/27/21   Charlott Holler, MD  glucose blood (FREESTYLE LITE) test strip Use to check blood sugar 3 times a day. Dx code e11.65 01/10/20   Dorothyann Peng, MD  Multiple Vitamins-Minerals (ALIVE WOMENS 50+ PO) Take 1 tablet by mouth daily.    [provider]  mupirocin ointment (BACTROBAN) 2 % Apply 1 Application topically 2 (two) times daily. 09/10/22   Anala Whisenant, [place], FNP  pravastatin (PRAVACHOL) 40 MG tablet Take 1 tablet (40 mg total) by mouth daily. 07/07/22   Dorothyann Peng, MD  Spacer/Aero-Holding Chambers (AEROCHAMBER PLUS) inhaler Use as instructed 08/19/19   Dorothyann Peng, MD  telmisartan (MICARDIS) 40 MG tablet TAKE 1 TABLET DAILY 05/27/22   Dorothyann Peng, MD    Family History Family History  Problem Relation Age of Onset   Stroke Mother    Cancer Sister     Social History Social History   Tobacco Use   Smoking status: Former    Current packs/day: 0.00    Average packs/day: 0.5 packs/day for 71.0 years (35.5 ttl pk-yrs)    Types: Cigarettes    Start date: 03/08/1947    Quit date:  2020    Years since quitting: 4.5   Smokeless tobacco: Never   Tobacco comments:    has tried cold [place]  Vaping Use   Vaping status: Never Used  Substance Use Topics   Alcohol use: No    Alcohol/week: 0.0 standard drinks of alcohol   Drug use: No     Allergies   Bactrim [sulfamethoxazole-trimethoprim] and Penicillins   Review of Systems Review of Systems  Constitutional:  Negative for fever.  Skin:  Positive for rash.     Physical Exam Triage Vital Signs ED Triage Vitals [09/25/22 1318]  Encounter Vitals Group     BP 114/66     Systolic BP Percentile      Diastolic BP Percentile      Pulse Rate 72     Resp 19     Temp 98.5 F (36.9 C)     Temp Source Oral     SpO2 94 %     Weight      Height      Head Circumference      Peak Flow      Pain Score      Pain Loc      Pain Education      Exclude from Growth Chart    No data found.  Updated Vital Signs BP 114/66 (BP Location: Left Arm)   Pulse 72   Temp 98.5 F (36.9 C) (Oral)   Resp 19   SpO2 94%   Visual Acuity Right Eye Distance:   Left Eye Distance:   Bilateral Distance:    Right Eye Near:   Left Eye Near:    Bilateral Near:     Physical Exam Vitals and nursing note reviewed.  Constitutional:      Appearance: Normal appearance.  HENT:     Head: Normocephalic and atraumatic.     Right Ear: External ear normal.     Left Ear: External ear normal.     Nose: Nose normal.     Mouth/Throat:     Mouth: Mucous membranes are moist.  Eyes:     Conjunctiva/sclera: Conjunctivae normal.  Cardiovascular:     Rate and Rhythm: Normal rate.  Pulmonary:  Effort: Pulmonary effort is normal. No respiratory distress.  Musculoskeletal:        General: Normal range of motion.  Skin:    General: Skin is warm and dry.     Findings: Erythema and rash present.     Comments: Erythematous rash with swelling of eyelids, lips and face.  Erythematous, pruritic rash to neck, trunk and arms.  Neurological:      General: No focal deficit present.     Mental Status: She is alert.  Psychiatric:        Mood and Affect: Mood normal.        Behavior: Behavior is cooperative.      UC Treatments / Results  Labs (all labs ordered are listed, but only abnormal results are displayed) Labs Reviewed - No data to display  EKG   Radiology No results found.  Procedures Procedures (including critical care time)  Medications Ordered in UC Medications  dexamethasone (DECADRON) injection 10 mg (has no administration in time range)    Initial Impression / Assessment and Plan / UC Course  I have reviewed the triage vital signs and the nursing notes.  Pertinent labs & imaging results that were available during my care of the patient were reviewed by me and considered in my medical decision making (see chart for details).  Vitals and triage reviewed, patient is hemodynamically stable.  Does have facial swelling and erythema from rash.  Erythematous pruritic rash to neck, trunk and arms as well.  Patient is a type II diabetic, will give a one-time dose of IM Decadron due to extent of rash.  Unclear what caused it, could be due to urgent or poison ivy, advised on hypoallergenic methods.  Advised topical steroid ointment.  Strict emergency and return precautions given, no questions at this time.    Final Clinical Impressions(s) / UC Diagnoses   Final diagnoses:  Allergic reaction, initial encounter  Rash and nonspecific skin eruption     Discharge Instructions      You are having an allergic reaction, it is unclear what caused this.  We have given you a steroid injection today in clinic to help with your swelling, itching and rash.  You can apply the topical steroid ointment twice daily, do not use this for longer than 7 days because it may cause skin thinning.  For itching you can take 25 mg of Benadryl every 6-8 hours.  Please do not use the Gain scented laundry detergent, use unscented and  hypoallergenic detergent, soaps and lotions.  Seek immediate care if you develop shortness of breath, trouble swallowing, or worsening of the rash.     ED Prescriptions     Medication Sig Dispense Auth. Provider   triamcinolone cream (KENALOG) 0.1 % Apply 1 Application topically 2 (two) times daily. 30 g Aarushi Hemric, [place], [place]      PDMP not reviewed this encounter.   Blayre Papania, [place], [place] 09/25/22 1351

## 2022-09-26 ENCOUNTER — Encounter: Payer: Self-pay | Admitting: Internal Medicine

## 2022-09-26 ENCOUNTER — Ambulatory Visit: Payer: Medicare Other | Admitting: Internal Medicine

## 2022-09-26 VITALS — BP 138/82 | HR 69 | Ht 63.0 in | Wt 165.0 lb

## 2022-09-26 DIAGNOSIS — C3412 Malignant neoplasm of upper lobe, left bronchus or lung: Secondary | ICD-10-CM | POA: Diagnosis not present

## 2022-09-26 DIAGNOSIS — J4489 Other specified chronic obstructive pulmonary disease: Secondary | ICD-10-CM

## 2022-09-26 DIAGNOSIS — J439 Emphysema, unspecified: Secondary | ICD-10-CM

## 2022-09-26 NOTE — Patient Instructions (Addendum)
Please schedule follow up scheduled with myself in 6 months.  If my schedule is not open yet, we will contact you with a reminder closer to that time. Please call [phone] if you haven't heard from [place] a month before.   Before your next visit I would like you to have:  CT Chest before your appointment with Dr. Basilio Cairo in August. I will give you the number to call.   Continue the breathing treatments as you are doing.   Call me sooner if issues.

## 2022-09-26 NOTE — Progress Notes (Signed)
Leah Wiggins    [id number]    1926/04/09  Primary Care Physician:Sanders, Melina Schools, MD Date of Appointment: 09/26/2022 Established Patient Visit  Chief complaint:   Chief Complaint  Patient presents with   Follow-up    Breathing no change      HPI: Leah Wiggins is a 89 y.o. woman with history of tobacco use disorder (35 pack years, quit 2020) and dyspnea. LUL lung mass on empiric SBRT.   Interval Updates: Here for follow up for COPD and LUL lung mass.  SBRT completed in May 2024.  Gets short of breath walking to the bathroom.  Having shortness of breath.  Taking breathing treatments a couple of times a day with albuterol.  Still pretty independent with ADLs. She makes herself breakfast.    I have reviewed the patient's family social and past medical history and updated as appropriate.   Past Medical History:  Diagnosis Date   Arthritis    "shoulders" (05/04/2017)   Breast cancer, right breast (HCC) 1991   GERD (gastroesophageal reflux disease)    Hard of hearing    Hyperlipidemia    Hypertension    Memory loss    Non-stress test nonreactive, 04/24/12, normal 12/10/2012   OSA on CPAP    "don't wear it all the time" (05/04/2017)   PVD (peripheral vascular disease) (HCC)    Tobacco use 12/10/2012   Type II diabetes mellitus (HCC)     Past Surgical History:  Procedure Laterality Date   APPENDECTOMY     BIOPSY  01/19/2018   Procedure: BIOPSY;  Surgeon: Jeani Hawking, MD;  Location: [facility];  Service: Endoscopy;;   BREAST BIOPSY Right 1991   CARDIOVASCULAR STRESS TEST  04/24/2012   No significant ST segment change suggestive of ischemia.   CATARACT EXTRACTION, BILATERAL Bilateral    COLONOSCOPY WITH PROPOFOL N/A 01/19/2018   Procedure: COLONOSCOPY WITH PROPOFOL;  Surgeon: Jeani Hawking, MD;  Location: [facility];  Service: Endoscopy;  Laterality: N/A;   ESOPHAGOGASTRODUODENOSCOPY (EGD) WITH PROPOFOL N/A 01/19/2018   Procedure:  ESOPHAGOGASTRODUODENOSCOPY (EGD) WITH PROPOFOL;  Surgeon: Jeani Hawking, MD;  Location: [facility];  Service: Endoscopy;  Laterality: N/A;   JOINT REPLACEMENT     LOWER EXTREMITY ARTERIAL DOPPLER Bilateral 05/07/2012   Left ABI-demonstrated mild arterial insufficiency. Right CIA 50-69% diameter reduction. Right SFA less than 50% diameter reduction. Left SFA 70-99% diameter reduction. Bilateral Runoff-Posterior tibial arteries appeared occluded.   MASTECTOMY PARTIAL / LUMPECTOMY W/ AXILLARY LYMPHADENECTOMY Right 1991    TOTAL HIP ARTHROPLASTY Right 2011    Family History  Problem Relation Age of Onset   Stroke Mother    Cancer Sister     Social History   Occupational History   Occupation: retired  Tobacco Use   Smoking status: Former    Current packs/day: 0.00    Average packs/day: 0.5 packs/day for 71.0 years (35.5 ttl pk-yrs)    Types: Cigarettes    Start date: 03/08/1947    Quit date: 2020    Years since quitting: 4.5   Smokeless tobacco: Never   Tobacco comments:    has tried cold [place]  Vaping Use   Vaping status: Never Used  Substance and Sexual Activity   Alcohol use: No    Alcohol/week: 0.0 standard drinks of alcohol   Drug use: No   Sexual activity: Not Currently    Partners: Male    Birth control/protection: Post-menopausal     Physical Exam: Blood pressure 138/82,  pulse 69, height 5\' 3"  (1.6 m), weight 165 lb (74.8 kg), SpO2 98%.  Gen:      No acute distress, well appearing, elderly Lungs:    kyphosis, no wheezes or crackles CV:        RRR   Data Reviewed: Imaging: I have personally reviewed the CT Chest non contrast October 2023 - 2.9 cm LUL mass concerning for primary lung cancer, calcified hilar adenopathy  PFTs:     Latest Ref Rng & Units 12/28/2021   10:56 AM  PFT Results  FVC-Pre L 1.43   FVC-Predicted Pre % 76   FVC-Post L 1.44   FVC-Predicted Post % 77   Pre FEV1/FVC % % 65   Post FEV1/FCV % % 65   FEV1-Pre L 0.93   FEV1-Predicted Pre  % 68   FEV1-Post L 0.94   DLCO uncorrected ml/min/mmHg 10.39   DLCO corrected ml/min/mmHg 10.39   TLC L 4.81   TLC % Predicted % 98   RV % Predicted % 128     Labs: Lab Results  Component Value Date   WBC 5.7 06/13/2022   HGB 11.4 06/13/2022   HCT 36.4 06/13/2022   MCV 72 (L) 06/13/2022   PLT 75 (LL) 06/13/2022   Lab Results  Component Value Date   NA 144 06/13/2022   K 4.3 06/13/2022   CL 104 06/13/2022   CO2 23 06/13/2022     Immunization status: Immunization History  Administered Date(s) Administered   Influenza Whole 02/16/2010   PFIZER(Purple Top)SARS-COV-2 Vaccination 04/29/2019, 05/20/2019, 01/09/2020, 09/22/2020   PNEUMOCOCCAL CONJUGATE-20 01/25/2021   Pneumococcal-Unspecified 06/19/2013   Tdap 01/25/2021    External Records Personally Reviewed:   Assessment:  Moderate COPD, FEV1 68% of predicted Left upper lobe lung mass s/p empiric SBRT therapy completed May 2024.   Plan/Recommendations:   CT Chest before your appointment with Dr. Basilio Cairo in August. I will give you the number to call.   Continue the breathing treatments as you are doing for COPD.   Call me sooner if issues.    Return to Care: Return in about 6 months (around 03/29/2023).    Durel Salts, MD Pulmonary and Critical Care Medicine [facility] Office:[phone]

## 2022-10-05 NOTE — Progress Notes (Signed)
Leah Wiggins presents to clinic today after completing radiation therapy to her left lung. She completed treatment on  07-11-22. We will review Ct Chest results from 10-13-22 with pt during this visit.   CT Chest Wo Contrast 10/13/2022  IMPRESSION: 1. Interval decrease in size of the left upper lobe pulmonary lesion with post treatment scarring. 2. Interval decrease in size of the subcarinal lymph node. 3. No new or progressive findings on today's study. 4. Aortic Atherosclerosis (ICD10-I70.0) and Emphysema (ICD10-J43.9  PAIN: {Pain rating:20411} {PAIN DESCRIPTION:21022940} over {Location on body:14304}.  RESPIRATORY: {CHL RAD ONC LUNG [id number]. Pt is on {Exam; oxygen delivery:30093}. Noted {Exam; skin abnormals:103} {CHL RAD ONC SKIN ROS:11522895}.  SWALLOWING/DIET: Pt {gerd dysphagia:12484} {Desc; oto dysphagia:17829}. {Daily diet habits:20576}.  OTHER: Pt complains of {Blank multiple:19196::"fatigue","weakness","loss of sleep","poor appetite"}.  There were no vitals taken for this visit.   Wt Readings from Last 3 Encounters:  09/26/22 165 lb (74.8 kg)  06/27/22 160 lb 9.6 oz (72.8 kg)  06/17/22 161 lb 8 oz (73.3 kg)

## 2022-10-10 ENCOUNTER — Ambulatory Visit (INDEPENDENT_AMBULATORY_CARE_PROVIDER_SITE_OTHER): Payer: Medicare Other | Admitting: Internal Medicine

## 2022-10-10 ENCOUNTER — Encounter: Payer: Self-pay | Admitting: Internal Medicine

## 2022-10-10 VITALS — BP 124/78 | HR 74 | Temp 98.6°F | Ht 63.0 in | Wt 165.4 lb

## 2022-10-10 DIAGNOSIS — E6609 Other obesity due to excess calories: Secondary | ICD-10-CM

## 2022-10-10 DIAGNOSIS — Z853 Personal history of malignant neoplasm of breast: Secondary | ICD-10-CM | POA: Diagnosis not present

## 2022-10-10 DIAGNOSIS — I119 Hypertensive heart disease without heart failure: Secondary | ICD-10-CM

## 2022-10-10 DIAGNOSIS — J439 Emphysema, unspecified: Secondary | ICD-10-CM | POA: Diagnosis not present

## 2022-10-10 DIAGNOSIS — Z6833 Body mass index (BMI) 33.0-33.9, adult: Secondary | ICD-10-CM

## 2022-10-10 DIAGNOSIS — N1831 Chronic kidney disease, stage 3a: Secondary | ICD-10-CM | POA: Diagnosis not present

## 2022-10-10 DIAGNOSIS — E1122 Type 2 diabetes mellitus with diabetic chronic kidney disease: Secondary | ICD-10-CM | POA: Diagnosis not present

## 2022-10-10 DIAGNOSIS — C3412 Malignant neoplasm of upper lobe, left bronchus or lung: Secondary | ICD-10-CM

## 2022-10-10 DIAGNOSIS — I700 Atherosclerosis of aorta: Secondary | ICD-10-CM | POA: Diagnosis not present

## 2022-10-10 DIAGNOSIS — J4489 Other specified chronic obstructive pulmonary disease: Secondary | ICD-10-CM | POA: Diagnosis not present

## 2022-10-10 DIAGNOSIS — D696 Thrombocytopenia, unspecified: Secondary | ICD-10-CM | POA: Diagnosis not present

## 2022-10-10 DIAGNOSIS — Z0000 Encounter for general adult medical examination without abnormal findings: Secondary | ICD-10-CM

## 2022-10-10 DIAGNOSIS — R82998 Other abnormal findings in urine: Secondary | ICD-10-CM | POA: Diagnosis not present

## 2022-10-10 LAB — POCT URINALYSIS DIPSTICK
Bilirubin, UA: NEGATIVE
Glucose, UA: NEGATIVE
Ketones, UA: NEGATIVE
Nitrite, UA: NEGATIVE
Protein, UA: NEGATIVE
Spec Grav, UA: 1.025 (ref 1.010–1.025)
Urobilinogen, UA: 0.2 E.U./dL
pH, UA: 5.5 (ref 5.0–8.0)

## 2022-10-10 NOTE — Progress Notes (Signed)
I,Victoria T Deloria Lair, CMA,acting as a Neurosurgeon for Gwynneth Aliment, MD.,have documented all relevant documentation on the behalf of Gwynneth Aliment, MD,as directed by  Gwynneth Aliment, MD while in the presence of Gwynneth Aliment, MD.  Subjective:    Patient ID: Leah Wiggins , female    DOB: 1926/05/31 , 89 y.o.   MRN: [id number]  Chief Complaint  Patient presents with   Annual Exam   Hypertension   Diabetes    HPI  Patient here for physical. She is accompanied by her daughter. She is no longer followed by GYN. She reports compliance with meds. She denies having any chest pain, worsening shortness of breath, palpitations and visual disturbances.   She reports experiencing " black stool". She denies blood in stool. She adds, experiencing a rash with irritation. At home she has applied Vaseline.   Diabetes She presents for her follow-up diabetic visit. She has type 2 diabetes mellitus. Her disease course has been stable. There are no hypoglycemic associated symptoms. Pertinent negatives for diabetes include no blurred vision. There are no hypoglycemic complications. Risk factors for coronary artery disease include diabetes mellitus, dyslipidemia, hypertension, obesity, sedentary lifestyle and post-menopausal.  Hypertension This is a chronic problem. The current episode started more than 1 year ago. The problem has been gradually improving since onset. The problem is controlled. Pertinent negatives include no blurred vision. The current treatment provides moderate improvement.     Past Medical History:  Diagnosis Date   Arthritis    "shoulders" (05/04/2017)   Breast cancer, right breast (HCC) 1991   GERD (gastroesophageal reflux disease)    Hard of hearing    Hyperlipidemia    Hypertension    Memory loss    Non-stress test nonreactive, 04/24/12, normal 12/10/2012   OSA on CPAP    "don't wear it all the time" (05/04/2017)   PVD (peripheral vascular disease) (HCC)    Tobacco use  12/10/2012   Type II diabetes mellitus (HCC)      Family History  Problem Relation Age of Onset   Stroke Mother    Cancer Sister      Current Outpatient Medications:    alendronate (FOSAMAX) 70 MG tablet, TAKE 1 TABLET EVERY WEDNESDAY WITH A FULL GLASS OF WATER ON AN EMPTY STOMACH, Disp: 12 tablet, Rfl: 3   ARTIFICIAL TEAR OP, Place 1 drop into both eyes 2 (two) times daily as needed (dry eyes)., Disp: , Rfl:    aspirin EC 81 MG tablet, Take 81 mg by mouth daily., Disp: , Rfl:    Blood Glucose Monitoring Suppl (FREESTYLE FREEDOM LITE) w/Device KIT, Use to check blood sugar 3 times a day. Dx code e11.65, Disp: 1 kit, Rfl: 3   budesonide (PULMICORT) 0.25 MG/2ML nebulizer solution, Take 2 mLs (0.25 mg total) by nebulization in the morning and at bedtime., Disp: 60 mL, Rfl: 11   FERROUS SULFATE PO, Take 2 tablets by mouth daily. gummy, Disp: , Rfl:    Fluocinolone Acetonide (DERMOTIC) 0.01 % OIL, Place 3 drops in ear(s) in the morning and at bedtime., Disp: 20 mL, Rfl: 0   formoterol (PERFOROMIST) 20 MCG/2ML nebulizer solution, Take 2 mLs (20 mcg total) by nebulization 2 (two) times daily., Disp: 60 mL, Rfl: 11   glucose blood (FREESTYLE LITE) test strip, Use to check blood sugar 3 times a day. Dx code e11.65, Disp: 150 each, Rfl: 3   Multiple Vitamins-Minerals (ALIVE WOMENS 50+ PO), Take 1 tablet by mouth daily., Disp: ,  Rfl:    mupirocin ointment (BACTROBAN) 2 %, Apply 1 Application topically 2 (two) times daily., Disp: 22 g, Rfl: 0   pravastatin (PRAVACHOL) 40 MG tablet, Take 1 tablet (40 mg total) by mouth daily., Disp: 90 tablet, Rfl: 3   Spacer/Aero-Holding Chambers (AEROCHAMBER PLUS) inhaler, Use as instructed, Disp: 1 each, Rfl: 2   telmisartan (MICARDIS) 40 MG tablet, TAKE 1 TABLET DAILY, Disp: 90 tablet, Rfl: 3   triamcinolone cream (KENALOG) 0.1 %, Apply 1 Application topically 2 (two) times daily., Disp: 30 g, Rfl: 0   albuterol (PROVENTIL) (2.5 MG/3ML) 0.083% nebulizer solution,  Take 3 mLs (2.5 mg total) by nebulization every 4 (four) hours as needed for wheezing or shortness of breath., Disp: 75 mL, Rfl: 2   Allergies  Allergen Reactions   Bactrim [Sulfamethoxazole-Trimethoprim] Swelling    Marked angioedema requiring hospitalization   Penicillins Other (See Comments)    Has patient had a PCN reaction causing immediate rash, facial/tongue/throat swelling, SOB or lightheadedness with hypotension: Unk Has patient had a PCN reaction causing severe rash involving mucus membranes or skin necrosis: Unk Has patient had a PCN reaction that required hospitalization: Unk Has patient had a PCN reaction occurring within the last 10 years: No If all of the above answers are "NO", then may proceed with Cephalosporin use.      The patient states she uses post menopausal status for birth control. No LMP recorded. Patient is postmenopausal.. Negative for Dysmenorrhea. Negative for: breast discharge, breast lump(s), breast pain and breast self exam. Associated symptoms include abnormal vaginal bleeding. Pertinent negatives include abnormal bleeding (hematology), anxiety, decreased libido, depression, difficulty falling sleep, dyspareunia, history of infertility, nocturia, sexual dysfunction, sleep disturbances, urinary incontinence, urinary urgency, vaginal discharge and vaginal itching. Diet regular.The patient states her exercise level is  minimal.   . The patient's tobacco use is:  Social History   Tobacco Use  Smoking Status Former   Current packs/day: 0.00   Average packs/day: 0.5 packs/day for 71.0 years (35.5 ttl pk-yrs)   Types: Cigarettes   Start date: 03/08/1947   Quit date: 2020   Years since quitting: 4.5  Smokeless Tobacco Never  Tobacco Comments   has tried cold [place]  . She has been exposed to passive smoke. The patient's alcohol use is:  Social History   Substance and Sexual Activity  Alcohol Use No   Alcohol/week: 0.0 standard drinks of alcohol    Review  of Systems  Constitutional: Negative.   HENT: Negative.    Eyes: Negative.  Negative for blurred vision.  Respiratory: Negative.    Cardiovascular: Negative.   Gastrointestinal: Negative.   Endocrine: Negative.   Genitourinary: Negative.   Musculoskeletal: Negative.   Skin: Negative.   Allergic/Immunologic: Negative.   Neurological: Negative.   Hematological: Negative.   Psychiatric/Behavioral: Negative.       Today's Vitals   10/10/22 1104  BP: 124/78  Pulse: 74  Temp: 98.6 F (37 C)  SpO2: 98%  Weight: 165 lb 6.4 oz (75 kg)  Height: 5\' 3"  (1.6 m)   Body mass index is 29.3 kg/m.  Wt Readings from Last 3 Encounters:  10/10/22 165 lb 6.4 oz (75 kg)  09/26/22 165 lb (74.8 kg)  06/27/22 160 lb 9.6 oz (72.8 kg)     Objective:  Physical Exam Vitals and nursing note reviewed.  Constitutional:      Appearance: Normal appearance.  HENT:     Head: Normocephalic and atraumatic.     Right Ear: Tympanic  membrane, ear canal and external ear normal.     Left Ear: Tympanic membrane, ear canal and external ear normal.     Nose: Nose normal.     Mouth/Throat:     Mouth: Mucous membranes are moist.     Pharynx: Oropharynx is clear.  Eyes:     Extraocular Movements: Extraocular movements intact.     Conjunctiva/sclera: Conjunctivae normal.     Pupils: Pupils are equal, round, and reactive to light.  Cardiovascular:     Rate and Rhythm: Normal rate and regular rhythm.     Pulses:          Dorsalis pedis pulses are 1+ on the right side and 1+ on the left side.       Posterior tibial pulses are 0 on the right side and 0 on the left side.     Heart sounds: Normal heart sounds.  Pulmonary:     Effort: Pulmonary effort is normal.     Comments: Decreased breath sounds at bases b/l Chest:  Breasts:    Tanner Score is 5.     Right: Absent.     Left: Normal.     Comments: Mastectomy on R Abdominal:     General: Abdomen is flat. Bowel sounds are normal.     Palpations: Abdomen  is soft.  Genitourinary:    Comments: deferred Musculoskeletal:        General: Normal range of motion.     Cervical back: Normal range of motion and neck supple.  Feet:     Right foot:     Protective Sensation: 5 sites tested.  5 sites sensed.     Skin integrity: Callus and dry skin present.     Toenail Condition: Right toenails are abnormally thick and long.     Left foot:     Protective Sensation: 5 sites tested.  5 sites sensed.     Skin integrity: Callus and dry skin present.     Toenail Condition: Left toenails are abnormally thick and long.  Skin:    General: Skin is warm and dry.     Comments: Hyperpigmentation b/l ankles/feet, brawny skin Dry, scaly skin posterior neck, no vesicular lesions noted   Neurological:     General: No focal deficit present.     Mental Status: She is alert and oriented to person, place, and time.  Psychiatric:        Mood and Affect: Mood normal.        Behavior: Behavior normal.         Assessment And Plan:     Encounter for general adult medical examination w/o abnormal findings Assessment & Plan: A full exam was performed. She is encouraged to perform breast exam monthly.  PATIENT IS ADVISED TO GET 30-45 MINUTES REGULAR EXERCISE NO LESS THAN FOUR TO FIVE DAYS PER WEEK - BOTH WEIGHTBEARING EXERCISES AND AEROBIC ARE RECOMMENDED.  PATIENT IS ADVISED TO FOLLOW A HEALTHY DIET WITH AT LEAST SIX FRUITS/VEGGIES PER DAY, DECREASE INTAKE OF RED MEAT, AND TO INCREASE FISH INTAKE TO TWO DAYS PER WEEK.  MEATS/FISH SHOULD NOT BE FRIED, BAKED OR BROILED IS PREFERABLE.  IT IS ALSO IMPORTANT TO CUT BACK ON YOUR SUGAR INTAKE. PLEASE AVOID ANYTHING WITH ADDED SUGAR, CORN SYRUP OR OTHER SWEETENERS. IF YOU MUST USE A SWEETENER, YOU CAN TRY STEVIA. IT IS ALSO IMPORTANT TO AVOID ARTIFICIALLY SWEETENERS AND DIET BEVERAGES. LASTLY, I SUGGEST WEARING SPF 50 SUNSCREEN ON EXPOSED PARTS AND ESPECIALLY WHEN IN THE DIRECT  SUNLIGHT FOR AN EXTENDED PERIOD OF TIME.  PLEASE AVOID  FAST FOOD RESTAURANTS AND INCREASE YOUR WATER INTAKE.    Hypertensive heart disease without heart failure Assessment & Plan: Chronic, controlled.  EKG performed, NSR w/o acute changes.  She will continue with telmisartan 40mg  daily. She is reminded to follow a low sodium diet. She will f/u in four to six months for re-evaluation.   Orders: -     POCT urinalysis dipstick -     Microalbumin / creatinine urine ratio -     EKG 12-Lead -     CMP14+EGFR -     Lipid panel  Aortic atherosclerosis (HCC) Assessment & Plan: Chronic, goal LDL<70.  Importance of following heart healthy lifestyle and statin compliance was stressed to the patient. She will continue with ASA 81mg  and pravastatin 40mg  daily.     Orders: -     Lipid panel  Type 2 diabetes mellitus with stage 3a chronic kidney disease, without long-term current use of insulin (HCC) Assessment & Plan: Chronic, diabetic foot exam was performed.  She is not on any meds at this time. She agrees to take meds if A1c goes above 7.0.  I DISCUSSED WITH THE PATIENT AT LENGTH REGARDING THE GOALS OF GLYCEMIC CONTROL AND POSSIBLE LONG-TERM COMPLICATIONS.  I  ALSO STRESSED THE IMPORTANCE OF COMPLIANCE WITH HOME GLUCOSE MONITORING, DIETARY RESTRICTIONS INCLUDING AVOIDANCE OF SUGARY DRINKS/PROCESSED FOODS,  ALONG WITH REGULAR EXERCISE.  I  ALSO STRESSED THE IMPORTANCE OF ANNUAL EYE EXAMS, SELF FOOT CARE AND COMPLIANCE WITH OFFICE VISITS.   Orders: -     POCT urinalysis dipstick -     Microalbumin / creatinine urine ratio -     EKG 12-Lead -     CBC -     CMP14+EGFR -     Lipid panel -     Hemoglobin A1c -     Ambulatory referral to Ophthalmology  Cancer of upper lobe of left lung [facility]) Assessment & Plan: s/p empiric SBRT therapy completed May 2024.    COPD with chronic bronchitis and emphysema (HCC) Assessment & Plan: COPD, sx are stable. She denies having any worsening sob. Pulmonary input appreciated, she has had PFTs. She use nebulizer  for symptoms, no new issues. No recent exacerbations.    Thrombocytopenia (HCC) -     CBC  Leukocytes in urine Assessment & Plan: Blood/leukocytes in urine. I will check urine culture prior to treating. She is currently asymptomatic.   Orders: -     Urine Culture  Class 1 obesity due to excess calories with serious comorbidity and body mass index (BMI) of 33.0 to 33.9 in adult Assessment & Plan: She is currently s/p lung cancer SBRT treatment. She is encouraged to gradually increase daily activity.    History of breast cancer     Return for 1 year physical, 4 month DM. Patient was given opportunity to ask questions. Patient verbalized understanding of the plan and was able to repeat key elements of the plan. All questions were answered to their satisfaction.    I, Gwynneth Aliment, MD, have reviewed all documentation for this visit. The documentation on 10/10/22 for the exam, diagnosis, procedures, and orders are all accurate and complete.

## 2022-10-10 NOTE — Assessment & Plan Note (Signed)
Chronic, diabetic foot exam was performed.  She is not on any meds at this time. She agrees to take meds if A1c goes above 7.0.  I DISCUSSED WITH THE PATIENT AT LENGTH REGARDING THE GOALS OF GLYCEMIC CONTROL AND POSSIBLE LONG-TERM COMPLICATIONS.  I  ALSO STRESSED THE IMPORTANCE OF COMPLIANCE WITH HOME GLUCOSE MONITORING, DIETARY RESTRICTIONS INCLUDING AVOIDANCE OF SUGARY DRINKS/PROCESSED FOODS,  ALONG WITH REGULAR EXERCISE.  I  ALSO STRESSED THE IMPORTANCE OF ANNUAL EYE EXAMS, SELF FOOT CARE AND COMPLIANCE WITH OFFICE VISITS.

## 2022-10-10 NOTE — Assessment & Plan Note (Signed)
A full exam was performed. She is encouraged to perform breast exam monthly.  PATIENT IS ADVISED TO GET 30-45 MINUTES REGULAR EXERCISE NO LESS THAN FOUR TO FIVE DAYS PER WEEK - BOTH WEIGHTBEARING EXERCISES AND AEROBIC ARE RECOMMENDED.  PATIENT IS ADVISED TO FOLLOW A HEALTHY DIET WITH AT LEAST SIX FRUITS/VEGGIES PER DAY, DECREASE INTAKE OF RED MEAT, AND TO INCREASE FISH INTAKE TO TWO DAYS PER WEEK.  MEATS/FISH SHOULD NOT BE FRIED, BAKED OR BROILED IS PREFERABLE.  IT IS ALSO IMPORTANT TO CUT BACK ON YOUR SUGAR INTAKE. PLEASE AVOID ANYTHING WITH ADDED SUGAR, CORN SYRUP OR OTHER SWEETENERS. IF YOU MUST USE A SWEETENER, YOU CAN TRY STEVIA. IT IS ALSO IMPORTANT TO AVOID ARTIFICIALLY SWEETENERS AND DIET BEVERAGES. LASTLY, I SUGGEST WEARING SPF 50 SUNSCREEN ON EXPOSED PARTS AND ESPECIALLY WHEN IN THE DIRECT SUNLIGHT FOR AN EXTENDED PERIOD OF TIME.  PLEASE AVOID FAST FOOD RESTAURANTS AND INCREASE YOUR WATER INTAKE.

## 2022-10-10 NOTE — Assessment & Plan Note (Addendum)
Chronic, controlled.  EKG performed, NSR w/o acute changes.  She will continue with telmisartan 40mg  daily. She is reminded to follow a low sodium diet. She will f/u in four to six months for re-evaluation.

## 2022-10-10 NOTE — Assessment & Plan Note (Addendum)
She is currently s/p lung cancer SBRT treatment. She is encouraged to gradually increase daily activity.

## 2022-10-10 NOTE — Patient Instructions (Signed)

## 2022-10-10 NOTE — Assessment & Plan Note (Signed)
s/p empiric SBRT therapy completed May 2024.

## 2022-10-10 NOTE — Assessment & Plan Note (Signed)
Chronic, goal LDL<70.  Importance of following heart healthy lifestyle and statin compliance was stressed to the patient. She will continue with ASA 81mg  and pravastatin 40mg  daily.

## 2022-10-10 NOTE — Assessment & Plan Note (Signed)
Blood/leukocytes in urine. I will check urine culture prior to treating. She is currently asymptomatic.

## 2022-10-10 NOTE — Assessment & Plan Note (Signed)
COPD, sx are stable. She denies having any worsening sob. Pulmonary input appreciated, she has had PFTs. She use nebulizer for symptoms, no new issues. No recent exacerbations.

## 2022-10-13 ENCOUNTER — Ambulatory Visit (HOSPITAL_COMMUNITY)
Admission: RE | Admit: 2022-10-13 | Discharge: 2022-10-13 | Disposition: A | Discharge status: Home / Self Care | Payer: Medicare Other | Source: Ambulatory Visit | Attending: Radiation Oncology | Admitting: Radiation Oncology

## 2022-10-13 DIAGNOSIS — J439 Emphysema, unspecified: Secondary | ICD-10-CM | POA: Diagnosis not present

## 2022-10-13 DIAGNOSIS — C3490 Malignant neoplasm of unspecified part of unspecified bronchus or lung: Secondary | ICD-10-CM | POA: Diagnosis not present

## 2022-10-13 DIAGNOSIS — C3492 Malignant neoplasm of unspecified part of left bronchus or lung: Secondary | ICD-10-CM | POA: Diagnosis not present

## 2022-10-13 DIAGNOSIS — I700 Atherosclerosis of aorta: Secondary | ICD-10-CM | POA: Diagnosis not present

## 2022-10-19 ENCOUNTER — Other Ambulatory Visit: Payer: Self-pay

## 2022-10-19 ENCOUNTER — Ambulatory Visit
Admission: RE | Admit: 2022-10-19 | Discharge: 2022-10-19 | Disposition: A | Discharge status: Home / Self Care | Payer: Medicare Other | Source: Ambulatory Visit | Attending: Radiation Oncology | Admitting: Radiation Oncology

## 2022-10-19 VITALS — BP 145/69 | HR 80 | Temp 97.3°F | Resp 20 | Ht 63.0 in | Wt 167.0 lb

## 2022-10-19 DIAGNOSIS — Z87891 Personal history of nicotine dependence: Secondary | ICD-10-CM | POA: Diagnosis not present

## 2022-10-19 DIAGNOSIS — C3412 Malignant neoplasm of upper lobe, left bronchus or lung: Secondary | ICD-10-CM

## 2022-10-19 NOTE — Progress Notes (Signed)
Radiation Oncology         [phone] ________________________________  Name: Leah Wiggins MRN: [id number]  Date: 10/19/2022  DOB: 1926-08-24  Follow-Up Visit Note  CC: Dorothyann Peng, MD  Dorothyann Peng, MD  Diagnosis and Prior Radiotherapy:       ICD-10-CM   1. Cancer of upper lobe of left lung (HCC)  C34.12 CT Chest Wo Contrast      CHIEF COMPLAINT:  Here for follow-up and surveillance of enlarging left upper lobe pulmonary nodule concerning for malignancy (non-biopsy proven); s/p SBRT  First Treatment Date: 2022-07-01 - Last Treatment Date: 2022-07-11   Plan Name: Lung_Lt_SBRT Site: Lung, Left Technique: SBRT/SRT-IMRT Mode: Photon Dose Per Fraction: 12 Gy Prescribed Dose (Delivered / Prescribed): 60 Gy / 60 Gy Prescribed Fxs (Delivered / Prescribed): 5 / 5  Narrative:  The patient returns today for routine follow-up. She completed her radiation treatment on 07/11/22. She most recently had a CT of the chest on 10/13/22. She is here to review her images.   CT of the chest on 10/13/22 showed interval decrease in the size of the LUL pulmonary lesion with post treatment scarring and interval decrease in size of the subcarinal lymph node.   PAIN: No at this time. RESPIRATORY:   Little bit in the evening with exertion.  No oxygen. SWALLOWING/DIET: No swallowing issues per patient.  Likes to eat cookies and sweets.  OTHER: Pt complains urinary frequency. Notably, this is an ongoing issue and her PCP is aware of it.     ALLERGIES:  is allergic to bactrim [sulfamethoxazole-trimethoprim] and penicillins.  Meds: Current Outpatient Medications  Medication Sig Dispense Refill   albuterol (PROVENTIL) (2.5 MG/3ML) 0.083% nebulizer solution Take 3 mLs (2.5 mg total) by nebulization every 4 (four) hours as needed for wheezing or shortness of breath. 75 mL 2   alendronate (FOSAMAX) 70 MG tablet TAKE 1 TABLET EVERY WEDNESDAY WITH A FULL GLASS OF WATER ON AN EMPTY STOMACH 12  tablet 3   ARTIFICIAL TEAR OP Place 1 drop into both eyes 2 (two) times daily as needed (dry eyes).     aspirin EC 81 MG tablet Take 81 mg by mouth daily.     Blood Glucose Monitoring Suppl (FREESTYLE FREEDOM LITE) w/Device KIT Use to check blood sugar 3 times a day. Dx code e11.65 1 kit 3   budesonide (PULMICORT) 0.25 MG/2ML nebulizer solution Take 2 mLs (0.25 mg total) by nebulization in the morning and at bedtime. 60 mL 11   FERROUS SULFATE PO Take 2 tablets by mouth daily. gummy     Fluocinolone Acetonide (DERMOTIC) 0.01 % OIL Place 3 drops in ear(s) in the morning and at bedtime. 20 mL 0   formoterol (PERFOROMIST) 20 MCG/2ML nebulizer solution Take 2 mLs (20 mcg total) by nebulization 2 (two) times daily. 60 mL 11   glucose blood (FREESTYLE LITE) test strip Use to check blood sugar 3 times a day. Dx code e11.65 150 each 3   Multiple Vitamins-Minerals (ALIVE WOMENS 50+ PO) Take 1 tablet by mouth daily.     mupirocin ointment (BACTROBAN) 2 % Apply 1 Application topically 2 (two) times daily. 22 g 0   pravastatin (PRAVACHOL) 40 MG tablet Take 1 tablet (40 mg total) by mouth daily. 90 tablet 3   Spacer/Aero-Holding Chambers (AEROCHAMBER PLUS) inhaler Use as instructed 1 each 2   telmisartan (MICARDIS) 40 MG tablet TAKE 1 TABLET DAILY 90 tablet 3   triamcinolone cream (KENALOG) 0.1 % Apply 1 Application  topically 2 (two) times daily. 30 g 0   No current facility-administered medications for this encounter.    Physical Findings: The patient is in no acute distress. Patient is alert and oriented. Wt Readings from Last 3 Encounters:  10/19/22 167 lb (75.8 kg)  10/10/22 165 lb 6.4 oz (75 kg)  09/26/22 165 lb (74.8 kg)    height is 5\' 3"  (1.6 m) and weight is 167 lb (75.8 kg). Her temperature is 97.3 F (36.3 C) (abnormal). Her blood pressure is 145/69 (abnormal) and her pulse is 80. Her respiration is 20 and oxygen saturation is 98%. .  In general this is a well appearing female in no acute  distress. She's alert and oriented x4 and appropriate throughout the examination. Cardiopulmonary assessment is negative for acute distress and she exhibits normal effort.  Using a cane for ambulation.      Lab Findings: Lab Results  Component Value Date   WBC 4.8 10/10/2022   HGB 11.9 10/10/2022   HCT 37.8 10/10/2022   MCV 76 (L) 10/10/2022   PLT CANCELED 10/10/2022    Lab Results  Component Value Date   TSH 1.800 05/25/2021    Radiographic Findings: CT Chest Wo Contrast  Result Date: 10/18/2022 CLINICAL DATA:  Non-small-cell lung cancer. Restaging. * Tracking Code: BO * EXAM: CT CHEST WITHOUT CONTRAST TECHNIQUE: Multidetector CT imaging of the chest was performed following the standard protocol without IV contrast. RADIATION DOSE REDUCTION: This exam was performed according to the departmental dose-optimization program which includes automated exposure control, adjustment of the mA and/or kV according to patient size and/or use of iterative reconstruction technique. COMPARISON:  Chest CT 01/26/2022 FINDINGS: Cardiovascular: The heart size is normal. No substantial pericardial effusion. Coronary artery calcification is evident. Moderate atherosclerotic calcification is noted in the wall of the thoracic aorta. Mediastinum/Nodes: No mediastinal lymphadenopathy. Subcarinal lymph node measured at 11 mm previously is 8 mm today on 73/2. No evidence for gross hilar lymphadenopathy although assessment is limited by the lack of intravenous contrast on the current study. The esophagus has normal imaging features. There is no axillary lymphadenopathy. Lungs/Pleura: Centrilobular emphsyema noted. Interval decrease in the left upper lobe pulmonary lesion now measuring on the order of 2.3 x 1.6 cm compared to 3.0 x 2.9 cm previously. Bandlike opacity incorporating the nodule is consistent with post treatment scarring. Stable scarring inferior lingula no new suspicious pulmonary nodule or mass. No focal  airspace consolidation. No pleural effusion. Upper Abdomen: Visualized portion of the upper abdomen is unremarkable. Musculoskeletal: No worrisome lytic or sclerotic osseous abnormality. Posttraumatic deformity noted right proximal humerus with degenerative changes and joint effusion in the left shoulder. IMPRESSION: 1. Interval decrease in size of the left upper lobe pulmonary lesion with post treatment scarring. 2. Interval decrease in size of the subcarinal lymph node. 3. No new or progressive findings on today's study. 4. Aortic Atherosclerosis (ICD10-I70.0) and Emphysema (ICD10-J43.9). Electronically Signed   By: Kennith Center M.D.   On: 10/18/2022 08:42    Impression/Plan:  Putative Stage 1 NSCLC treated with SBRT  Recent imaging shows good response to therapy. We personally reviewed the images with the patient today. She is doing well overall and we are pleased with her response to radiation.  We discussed her options for follow-up at this point. Considering great response, age, and other health concerns, one approach is to get imaging on an as-needed basis, only repeating scans if symptoms develop. Alternatively, we could start regular follow-up with annual CT scans.  Patient and her daughter would like to proceed with routine follow-up at this time. CT of the chest ordered in one year with an appointment 3-4 days afterwards to review the scan. She understands to call with any questions or concerns in the meantime.    On date of service, in total, I spent 20 minutes on this encounter. Patient was seen in person. _____________________________________   Joyice Faster, PA-C    Lonie Peak, MD

## 2022-12-21 ENCOUNTER — Encounter: Payer: Self-pay | Admitting: Podiatry

## 2022-12-21 ENCOUNTER — Ambulatory Visit (INDEPENDENT_AMBULATORY_CARE_PROVIDER_SITE_OTHER): Payer: Medicare Other | Admitting: Podiatry

## 2022-12-21 DIAGNOSIS — I739 Peripheral vascular disease, unspecified: Secondary | ICD-10-CM | POA: Diagnosis not present

## 2022-12-21 DIAGNOSIS — B351 Tinea unguium: Secondary | ICD-10-CM

## 2022-12-21 DIAGNOSIS — M79609 Pain in unspecified limb: Secondary | ICD-10-CM

## 2022-12-21 DIAGNOSIS — M79676 Pain in unspecified toe(s): Secondary | ICD-10-CM | POA: Diagnosis not present

## 2022-12-21 DIAGNOSIS — E1142 Type 2 diabetes mellitus with diabetic polyneuropathy: Secondary | ICD-10-CM

## 2022-12-21 NOTE — Progress Notes (Signed)
Subjective:  Patient ID: Leah Wiggins, female    DOB: [birth year],  MRN: [id number]  Leah Wiggins presents to clinic today for at risk foot care. Pt has h/o NIDDM with PAD and painful elongated mycotic toenails 1-5 bilaterally which are tender when wearing enclosed shoe gear. Pain is relieved with periodic professional debridement. Patient states her great toes are sore today, most symptomatic when wearing enclosed shoe gear. She is accompanied by her daughter on today's visit. Chief Complaint  Patient presents with   Diabetes    [facility]- Pt states her big toes hurt did not check bs today but yesterday it was 131. A1C-6.9  PCP seen 10/10/2023   New problem(s): None.   PCP is Dorothyann Peng, MD.  Allergies  Allergen Reactions   Bactrim [Sulfamethoxazole-Trimethoprim] Swelling    Marked angioedema requiring hospitalization   Penicillins Other (See Comments)    Has patient had a PCN reaction causing immediate rash, facial/tongue/throat swelling, SOB or lightheadedness with hypotension: Unk Has patient had a PCN reaction causing severe rash involving mucus membranes or skin necrosis: Unk Has patient had a PCN reaction that required hospitalization: Unk Has patient had a PCN reaction occurring within the last 10 years: No If all of the above answers are "NO", then may proceed with Cephalosporin use.    Review of Systems: Negative except as noted in the HPI.  Objective: No changes noted in today's physical examination. There were no vitals filed for this visit. EMAROSA Wiggins is a pleasant 89 y.o. female WD, WN in NAD. AAO x 3.  Vascular Examination: CFT <3 seconds b/l. DP pulses faintly palpable b/l. PT pulses nonpalpable b/l. Digital hair absent. Skin temperature gradient warm to warm b/l. No pain with calf compression. No ischemia or gangrene. No cyanosis or clubbing noted b/l. Trace edema noted BLE.   Neurological Examination: Protective sensation diminished with 10g  monofilament b/l.  Dermatological Examination: Pedal skin warm and supple b/l. No open wounds b/l. No interdigital macerations. Toenails 1-5 b/l thick, discolored, elongated with subungual debris and pain on dorsal palpation.  No corns, calluses nor porokeratotic lesions noted.  Musculoskeletal Examination: Muscle strength 5/5 to all lower extremity muscle groups bilaterally. HAV with bunion deformity noted b/l LE. Hammertoe deformity noted 2-5 b/l.  Radiographs: None  Last HgA1c:      Latest Ref Rng & Units 10/10/2022   11:54 AM 06/13/2022    4:29 PM 02/08/2022    3:50 PM  Hemoglobin A1C  Hemoglobin-A1c 4.8 - 5.6 % 6.9  7.1  6.9    Assessment/Plan: 1. Pain due to onychomycosis of nail   2. PAD (peripheral artery disease), abnormal ABIs   3. Diabetic peripheral neuropathy associated with type 2 diabetes mellitus (HCC)     -Patient's family member present. All questions/concerns addressed on today's visit. -Continue foot and shoe inspections daily. Monitor blood glucose per PCP/Endocrinologist's recommendations. -Patient to continue soft, supportive shoe gear daily. -Mycotic toenails 1-5 bilaterally were debrided in length and girth with sterile nail nippers and dremel without incident. Patient noted relief post-treatment today. -Patient/POA to call should there be question/concern in the interim.   Return in about 3 months (around 03/23/2023).  Freddie Breech, DPM

## 2023-02-01 ENCOUNTER — Encounter (HOSPITAL_COMMUNITY): Payer: Self-pay

## 2023-02-01 ENCOUNTER — Ambulatory Visit (HOSPITAL_COMMUNITY)
Admission: EM | Admit: 2023-02-01 | Discharge: 2023-02-01 | Disposition: A | Discharge status: Home / Self Care | Payer: Medicare Other | Attending: Family Medicine | Admitting: Family Medicine

## 2023-02-01 DIAGNOSIS — R040 Epistaxis: Secondary | ICD-10-CM | POA: Diagnosis not present

## 2023-02-01 LAB — CBC
HCT: 36.4 % (ref 36.0–46.0)
Hemoglobin: 11.7 g/dL — ABNORMAL LOW (ref 12.0–15.0)
MCH: 24.2 pg — ABNORMAL LOW (ref 26.0–34.0)
MCHC: 32.1 g/dL (ref 30.0–36.0)
MCV: 75.4 fL — ABNORMAL LOW (ref 80.0–100.0)
Platelets: 48 10*3/uL — ABNORMAL LOW (ref 150–400)
RBC: 4.83 MIL/uL (ref 3.87–5.11)
RDW: 17.6 % — ABNORMAL HIGH (ref 11.5–15.5)
WBC: 4.9 10*3/uL (ref 4.0–10.5)
nRBC: 0 % (ref 0.0–0.2)

## 2023-02-01 MED ORDER — OXYMETAZOLINE HCL 0.05 % NA SOLN
1.0000 | NASAL | Status: AC | PRN
  Administered 2023-02-01: 1 via NASAL

## 2023-02-01 MED ORDER — OXYMETAZOLINE HCL 0.05 % NA SOLN
NASAL | Status: AC
  Filled 2023-02-01: qty 30

## 2023-02-01 NOTE — Discharge Instructions (Addendum)
You have had labs (blood counts) sent today. We will call you with any significant abnormalities or if there is need to begin or change treatment or pursue further follow up.  You may also review your test results online through MyChart. If you do not have a MyChart account, instructions to sign up should be on your discharge paperwork.

## 2023-02-01 NOTE — ED Provider Notes (Signed)
[facility]   [id number] 02/01/23 Arrival Time: 1216  ASSESSMENT & PLAN:  1. Right-sided epistaxis    Bleed currently controlled. Discussed AFRIN use as needed + pressure.  Meds ordered this encounter  Medications   oxymetazoline (AFRIN) 0.05 % nasal spray 1 spray   ED if recurs and unable to control.  Reviewed expectations re: course of current medical issues. Questions answered. Outlined signs and symptoms indicating need for more acute intervention. Patient verbalized understanding. After Visit Summary given.   SUBJECTIVE: History from: patient and family.  Leah Wiggins is a 89 y.o. female who is here with her daughter. Reports she had a nose bleed this morning. R-sided; maybe 5-10 min. Now controlled. Pt would like to make sure everything is okay.  Denies h/o nosebleeds. Denies recent illnesses.  Social History   Tobacco Use  Smoking Status Former   Current packs/day: 0.00   Average packs/day: 0.5 packs/day for 71.0 years (35.5 ttl pk-yrs)   Types: Cigarettes   Start date: 03/08/1947   Quit date: 2020   Years since quitting: 4.9  Smokeless Tobacco Never  Tobacco Comments   has tried cold [place]     OBJECTIVE:  Vitals:   02/01/23 1234 02/01/23 1235  BP:  (!) 157/74  Pulse: 85   Resp: 18   Temp: 98.9 F (37.2 C)   TempSrc: Oral   SpO2: 94%      General appearance: alert; NAD HEENT: R inner naris slightly irritated; dried blood; no active bleeding Skin: warm and dry Psychological: alert and cooperative; normal mood and affect  Allergies  Allergen Reactions   Bactrim [Sulfamethoxazole-Trimethoprim] Swelling    Marked angioedema requiring hospitalization   Penicillins Other (See Comments)    Has patient had a PCN reaction causing immediate rash, facial/tongue/throat swelling, SOB or lightheadedness with hypotension: Unk Has patient had a PCN reaction causing severe rash involving mucus membranes or skin necrosis: Unk Has patient  had a PCN reaction that required hospitalization: Unk Has patient had a PCN reaction occurring within the last 10 years: No If all of the above answers are "NO", then may proceed with Cephalosporin use.    Past Medical History:  Diagnosis Date   Arthritis    "shoulders" (05/04/2017)   Breast cancer, right breast (HCC) 1991   GERD (gastroesophageal reflux disease)    Hard of hearing    Hyperlipidemia    Hypertension    Memory loss    Non-stress test nonreactive, 04/24/12, normal 12/10/2012   OSA on CPAP    "don't wear it all the time" (05/04/2017)   PVD (peripheral vascular disease) (HCC)    Tobacco use 12/10/2012   Type II diabetes mellitus (HCC)    Family History  Problem Relation Age of Onset   Stroke Mother    Cancer Sister    Social History   Socioeconomic History   Marital status: Widowed    Spouse name: Not on file   Number of children: 8   Years of education: 8th grade   Highest education level: Not on file  Occupational History   Occupation: retired  Tobacco Use   Smoking status: Former    Current packs/day: 0.00    Average packs/day: 0.5 packs/day for 71.0 years (35.5 ttl pk-yrs)    Types: Cigarettes    Start date: 03/08/1947    Quit date: 2020    Years since quitting: 4.9   Smokeless tobacco: Never   Tobacco comments:    has tried cold [place]  Vaping Use   Vaping status: Never Used  Substance and Sexual Activity   Alcohol use: No    Alcohol/week: 0.0 standard drinks of alcohol   Drug use: No   Sexual activity: Not Currently    Partners: Male    Birth control/protection: Post-menopausal  Other Topics Concern   Not on file  Social History Narrative   Lives at home with her son.   She had eight children (two sets of twins).   No daily use of caffeine.   Right-handed.   Social Determinants of Health   Financial Resource Strain: Low Risk  (03/09/2022)   Overall Financial Resource Strain (CARDIA)    Difficulty of Paying Living Expenses: Not hard at all   Food Insecurity: No Food Insecurity (10/19/2022)   Hunger Vital Sign    Worried About Running Out of Food in the Last Year: Never true    Ran Out of Food in the Last Year: Never true  Transportation Needs: No Transportation Needs (10/19/2022)   PRAPARE - Administrator, Civil Service (Medical): No    Lack of Transportation (Non-Medical): No  Physical Activity: Inactive (03/09/2022)   Exercise Vital Sign    Days of Exercise per Week: 0 days    Minutes of Exercise per Session: 0 min  Stress: No Stress Concern Present (03/09/2022)   [employer] of Occupational Health - Occupational Stress Questionnaire    Feeling of Stress : Not at all  Social Connections: Not on file  Intimate Partner Violence: Not At Risk (10/19/2022)   Humiliation, Afraid, Rape, and Kick questionnaire    Fear of Current or Ex-Partner: No    Emotionally Abused: No    Physically Abused: No    Sexually Abused: No            Mardella Layman, MD 02/01/23 1453

## 2023-02-01 NOTE — ED Triage Notes (Signed)
Patient is here with Daughter. Reports she had a nose bleed this morning. Pt would like to make sure everything is okay.

## 2023-02-10 ENCOUNTER — Other Ambulatory Visit: Payer: Self-pay

## 2023-02-10 ENCOUNTER — Emergency Department (HOSPITAL_BASED_OUTPATIENT_CLINIC_OR_DEPARTMENT_OTHER): Payer: Medicare Other | Admitting: Radiology

## 2023-02-10 ENCOUNTER — Encounter (HOSPITAL_BASED_OUTPATIENT_CLINIC_OR_DEPARTMENT_OTHER): Payer: Self-pay

## 2023-02-10 ENCOUNTER — Emergency Department (HOSPITAL_BASED_OUTPATIENT_CLINIC_OR_DEPARTMENT_OTHER): Payer: Medicare Other

## 2023-02-10 ENCOUNTER — Emergency Department (HOSPITAL_BASED_OUTPATIENT_CLINIC_OR_DEPARTMENT_OTHER)
Admission: EM | Admit: 2023-02-10 | Discharge: 2023-02-10 | Disposition: A | Discharge status: Home / Self Care | Payer: Medicare Other | Attending: Emergency Medicine | Admitting: Emergency Medicine

## 2023-02-10 DIAGNOSIS — R918 Other nonspecific abnormal finding of lung field: Secondary | ICD-10-CM | POA: Diagnosis not present

## 2023-02-10 DIAGNOSIS — R109 Unspecified abdominal pain: Secondary | ICD-10-CM | POA: Insufficient documentation

## 2023-02-10 DIAGNOSIS — E119 Type 2 diabetes mellitus without complications: Secondary | ICD-10-CM | POA: Diagnosis not present

## 2023-02-10 DIAGNOSIS — Z79899 Other long term (current) drug therapy: Secondary | ICD-10-CM | POA: Insufficient documentation

## 2023-02-10 DIAGNOSIS — R0602 Shortness of breath: Secondary | ICD-10-CM | POA: Insufficient documentation

## 2023-02-10 DIAGNOSIS — N644 Mastodynia: Secondary | ICD-10-CM | POA: Diagnosis not present

## 2023-02-10 DIAGNOSIS — Z7982 Long term (current) use of aspirin: Secondary | ICD-10-CM | POA: Diagnosis not present

## 2023-02-10 DIAGNOSIS — F17200 Nicotine dependence, unspecified, uncomplicated: Secondary | ICD-10-CM | POA: Diagnosis not present

## 2023-02-10 DIAGNOSIS — Z853 Personal history of malignant neoplasm of breast: Secondary | ICD-10-CM | POA: Diagnosis not present

## 2023-02-10 DIAGNOSIS — J449 Chronic obstructive pulmonary disease, unspecified: Secondary | ICD-10-CM | POA: Insufficient documentation

## 2023-02-10 DIAGNOSIS — K861 Other chronic pancreatitis: Secondary | ICD-10-CM | POA: Diagnosis not present

## 2023-02-10 DIAGNOSIS — N189 Chronic kidney disease, unspecified: Secondary | ICD-10-CM | POA: Diagnosis not present

## 2023-02-10 DIAGNOSIS — Z7951 Long term (current) use of inhaled steroids: Secondary | ICD-10-CM | POA: Diagnosis not present

## 2023-02-10 DIAGNOSIS — I129 Hypertensive chronic kidney disease with stage 1 through stage 4 chronic kidney disease, or unspecified chronic kidney disease: Secondary | ICD-10-CM | POA: Insufficient documentation

## 2023-02-10 DIAGNOSIS — R0989 Other specified symptoms and signs involving the circulatory and respiratory systems: Secondary | ICD-10-CM | POA: Diagnosis not present

## 2023-02-10 DIAGNOSIS — R079 Chest pain, unspecified: Secondary | ICD-10-CM | POA: Diagnosis not present

## 2023-02-10 DIAGNOSIS — K8689 Other specified diseases of pancreas: Secondary | ICD-10-CM | POA: Diagnosis not present

## 2023-02-10 DIAGNOSIS — R0789 Other chest pain: Secondary | ICD-10-CM | POA: Diagnosis not present

## 2023-02-10 LAB — BASIC METABOLIC PANEL
Anion gap: 6 (ref 5–15)
BUN: 16 mg/dL (ref 8–23)
CO2: 30 mmol/L (ref 22–32)
Calcium: 9.4 mg/dL (ref 8.9–10.3)
Chloride: 105 mmol/L (ref 98–111)
Creatinine, Ser: 0.85 mg/dL (ref 0.44–1.00)
GFR, Estimated: >60 mL/min (ref >60)
Glucose, Bld: 116 mg/dL — ABNORMAL HIGH (ref 70–99)
Potassium: 3.7 mmol/L (ref 3.5–5.1)
Sodium: 141 mmol/L (ref 135–145)

## 2023-02-10 LAB — CBC
HCT: 35.8 % — ABNORMAL LOW (ref 36.0–46.0)
Hemoglobin: 11.6 g/dL — ABNORMAL LOW (ref 12.0–15.0)
MCH: 24.5 pg — ABNORMAL LOW (ref 26.0–34.0)
MCHC: 32.4 g/dL (ref 30.0–36.0)
MCV: 75.5 fL — ABNORMAL LOW (ref 80.0–100.0)
Platelets: 75 10*3/uL — ABNORMAL LOW (ref 150–400)
RBC: 4.74 MIL/uL (ref 3.87–5.11)
RDW: 17.9 % — ABNORMAL HIGH (ref 11.5–15.5)
WBC: 4.5 10*3/uL (ref 4.0–10.5)
nRBC: 0 % (ref 0.0–0.2)

## 2023-02-10 LAB — HEPATIC FUNCTION PANEL
ALT: 12 U/L (ref 0–44)
AST: 18 U/L (ref 15–41)
Albumin: 3.8 g/dL (ref 3.5–5.0)
Alkaline Phosphatase: 72 U/L (ref 38–126)
Bilirubin, Direct: 0.1 mg/dL (ref 0.0–0.2)
Indirect Bilirubin: 0.3 mg/dL (ref 0.3–0.9)
Total Bilirubin: 0.4 mg/dL (ref <1.2)
Total Protein: 6.9 g/dL (ref 6.5–8.1)

## 2023-02-10 LAB — TROPONIN I (HIGH SENSITIVITY)
Troponin I (High Sensitivity): 8 ng/L (ref <18)
Troponin I (High Sensitivity): 9 ng/L (ref <18)

## 2023-02-10 MED ORDER — IOHEXOL 350 MG/ML SOLN
75.0000 mL | Freq: Once | INTRAVENOUS | Status: AC | PRN
  Administered 2023-02-10: 75 mL via INTRAVENOUS

## 2023-02-10 NOTE — Discharge Instructions (Signed)
You were seen in the emergency department today for breast pain.  As we discussed your lab workup and imaging all looked reassuring today.  We did not see any evidence of heart damage, blood clots, or change in the lung mass that you have on the left.  If you continue to have this breast pain, I recommend following up with your primary doctor as they could order an ultrasound for you.  Continue to monitor how you're doing and return to the ER for new or worsening symptoms.

## 2023-02-10 NOTE — ED Notes (Signed)
Pt alert and oriented X 4 at the time of discharge. RR even and unlabored. No acute distress noted. Pt verbalized understanding of discharge instructions as discussed. Pt ambulatory to lobby at time of discharge.

## 2023-02-10 NOTE — ED Provider Notes (Signed)
[place] [facility]PARTMENT AT [facility] Provider Note   CSN: [id number] Arrival date & time: 02/10/23  1302     History  Chief Complaint  Patient presents with   Chest Pain    Leah Wiggins is a 89 y.o. female who presents the ER complaining of left-sided breast pain starting this morning.  Intermittent abdominal pain for the past several days although none at present.  Mildly worsening shortness of breath, especially at night.  Does not wear her CPAP at night anymore, but has been keeping up with her breathing treatments.  No significant swelling.   Chest Pain Associated symptoms: shortness of breath   Associated symptoms: no cough and no fever        Home Medications Prior to Admission medications   Medication Sig Start Date End Date Taking? Authorizing Provider  albuterol (PROVENTIL) (2.5 MG/3ML) 0.083% nebulizer solution Take 3 mLs (2.5 mg total) by nebulization every 4 (four) hours as needed for wheezing or shortness of breath. 08/06/20 03/30/22  Dorothyann Peng, MD  alendronate (FOSAMAX) 70 MG tablet TAKE 1 TABLET EVERY WEDNESDAY WITH A FULL GLASS OF WATER ON AN EMPTY STOMACH 07/07/22   Dorothyann Peng, MD  ARTIFICIAL TEAR OP Place 1 drop into both eyes 2 (two) times daily as needed (dry eyes).    [provider]  aspirin EC 81 MG tablet Take 81 mg by mouth daily.    [provider]  Blood Glucose Monitoring Suppl (FREESTYLE FREEDOM LITE) w/Device KIT Use to check blood sugar 3 times a day. Dx code e11.65 09/18/19   Dorothyann Peng, MD  budesonide (PULMICORT) 0.25 MG/2ML nebulizer solution Take 2 mLs (0.25 mg total) by nebulization in the morning and at bedtime. 12/27/21   Charlott Holler, MD  FERROUS SULFATE PO Take 2 tablets by mouth daily. gummy    [provider]  Fluocinolone Acetonide (DERMOTIC) 0.01 % OIL Place 3 drops in ear(s) in the morning and at bedtime. 09/18/21   Rising, Rebecca, PA-C  formoterol (PERFOROMIST) 20 MCG/2ML  nebulizer solution Take 2 mLs (20 mcg total) by nebulization 2 (two) times daily. 12/27/21   Charlott Holler, MD  glucose blood (FREESTYLE LITE) test strip Use to check blood sugar 3 times a day. Dx code e11.65 01/10/20   Dorothyann Peng, MD  Multiple Vitamins-Minerals (ALIVE WOMENS 50+ PO) Take 1 tablet by mouth daily.    [provider]  mupirocin ointment (BACTROBAN) 2 % Apply 1 Application topically 2 (two) times daily. 09/10/22   Garrison, [place] N, FNP  pravastatin (PRAVACHOL) 40 MG tablet Take 1 tablet (40 mg total) by mouth daily. 07/07/22   Dorothyann Peng, MD  Spacer/Aero-Holding Chambers (AEROCHAMBER PLUS) inhaler Use as instructed 08/19/19   Dorothyann Peng, MD  telmisartan (MICARDIS) 40 MG tablet TAKE 1 TABLET DAILY 05/27/22   Dorothyann Peng, MD  triamcinolone cream (KENALOG) 0.1 % Apply 1 Application topically 2 (two) times daily. 09/25/22   Garrison, [place] N, FNP      Allergies    Bactrim [sulfamethoxazole-trimethoprim] and Penicillins    Review of Systems   Review of Systems  Constitutional:  Negative for chills and fever.  Respiratory:  Positive for shortness of breath. Negative for cough.   Cardiovascular:  Positive for chest pain. Negative for leg swelling.  Musculoskeletal:        Breast pain  All other systems reviewed and are negative.   Physical Exam Updated Vital Signs BP (!) 180/95   Pulse 66  Temp 97.7 F (36.5 C) (Oral)   Resp 19   SpO2 96%  Physical Exam Vitals and nursing note reviewed. Exam conducted with a chaperone present.  Constitutional:      Appearance: Normal appearance.  HENT:     Head: Normocephalic and atraumatic.  Eyes:     Conjunctiva/sclera: Conjunctivae normal.  Cardiovascular:     Rate and Rhythm: Normal rate and regular rhythm.  Pulmonary:     Effort: Pulmonary effort is normal. No respiratory distress.     Breath sounds: Wheezing present.     Comments: Very mild wheezing Chest:  Breasts:    Right: Normal.     Left:  Tenderness present. No swelling, inverted nipple, mass, nipple discharge or skin change.     Comments: Generalized tenderness to palpation of the left breast without focal tenderness or deformity Abdominal:     General: There is no distension.     Palpations: Abdomen is soft.     Tenderness: There is no abdominal tenderness.  Musculoskeletal:     Right lower leg: No edema.     Left lower leg: No edema.  Skin:    General: Skin is warm and dry.  Neurological:     General: No focal deficit present.     Mental Status: She is alert.     ED Results / Procedures / Treatments   Labs (all labs ordered are listed, but only abnormal results are displayed) Labs Reviewed  BASIC METABOLIC PANEL - Abnormal; Notable for the following components:      Result Value   Glucose, Bld 116 (*)    All other components within normal limits  CBC - Abnormal; Notable for the following components:   Hemoglobin 11.6 (*)    HCT 35.8 (*)    MCV 75.5 (*)    MCH 24.5 (*)    RDW 17.9 (*)    Platelets 75 (*)    All other components within normal limits  HEPATIC FUNCTION PANEL  TROPONIN I (HIGH SENSITIVITY)  TROPONIN I (HIGH SENSITIVITY)    EKG None  Radiology CT Angio Chest PE W and/or Wo Contrast  Result Date: 02/10/2023 CLINICAL DATA:  Left breast pain bilateral abdominal pain EXAM: CT ANGIOGRAPHY CHEST WITH CONTRAST TECHNIQUE: Multidetector CT imaging of the chest was performed using the standard protocol during bolus administration of intravenous contrast. Multiplanar CT image reconstructions and MIPs were obtained to evaluate the vascular anatomy. RADIATION DOSE REDUCTION: This exam was performed according to the departmental dose-optimization program which includes automated exposure control, adjustment of the mA and/or kV according to patient size and/or use of iterative reconstruction technique. CONTRAST:  75mL OMNIPAQUE IOHEXOL 350 MG/ML SOLN COMPARISON:  Chest x-ray 02/10/2023, CT chest 10/13/2022,  12/13/2021 FINDINGS: Cardiovascular: Satisfactory opacification of the pulmonary arteries to the segmental level. No evidence of pulmonary embolism. Moderate aortic atherosclerosis. No aneurysm. Coronary vascular calcification. Normal cardiac size. No pericardial effusion. Mediastinum/Nodes: Midline trachea. No thyroid mass. Calcified mediastinal and hilar nodes compatible with prior granulomatous disease. No significantly enlarged lymph nodes on today study. Esophagus within normal limits. Lungs/Pleura: Emphysema. No acute airspace disease, pleural effusion or pneumothorax. Left upper lobe spiculated mass measuring about 2.3 by 1.6 cm on series 6, image 39 and stable as compared with 10/13/2022, decreased compared to exams prior to that. Surrounding bandlike densities consistent with scarring. Upper Abdomen: No acute finding. Numerous pancreatic calcifications consistent with chronic pancreatitis. Musculoskeletal: Right mastectomy. No acute or suspicious osseous abnormality Review of the MIP images confirms the  above findings. IMPRESSION: 1. Negative for acute pulmonary embolus. 2. Emphysema. 3. Left upper lobe spiculated mass measuring up to 2.3 cm, stable as compared with 10/13/2022, decreased compared to exams prior to that. Surrounding band like densities presumably due to scarring. 4. Chronic pancreatitis. Aortic Atherosclerosis (ICD10-I70.0) and Emphysema (ICD10-J43.9). Electronically Signed   By: Jasmine Pang M.D.   On: 02/10/2023 17:53   DG Chest 2 View  Result Date: 02/10/2023 CLINICAL DATA:  Chest pain. EXAM: CHEST - 2 VIEW COMPARISON:  06/08/2021. FINDINGS: Low lung volume. There are stable nonspecific predominantly linear opacities throughout bilateral lungs, favored to represent underlying chronic lung parenchymal fibrosis/scarring. Bilateral lung fields are otherwise clear. No acute consolidation or lung collapse. No pulmonary edema. Bilateral costophrenic angles are clear. Stable  cardio-mediastinal silhouette. No acute osseous abnormalities. Marked degenerative changes of bilateral glenohumeral joints noted, left more than right. The soft tissues are within normal limits. IMPRESSION: No active cardiopulmonary disease. Electronically Signed   By: Jules Schick M.D.   On: 02/10/2023 15:28    Procedures Procedures    Medications Ordered in ED Medications  iohexol (OMNIPAQUE) 350 MG/ML injection 75 mL (75 mLs Intravenous Contrast Given 02/10/23 1640)    ED Course/ Medical Decision Making/ A&P             HEART Score: 4                    Medical Decision Making Amount and/or Complexity of Data Reviewed Labs: ordered. Radiology: ordered.   This patient is a 89 y.o. female  who presents to the ED for concern of L sided breast/chest pain and abdominal pain.   Differential diagnoses prior to evaluation: The emergent differential diagnosis includes, but is not limited to,  ACS, pericarditis, myocarditis, aortic dissection, PE, pneumothorax, esophageal spasm or rupture, chronic angina, pneumonia, bronchitis, GERD, reflux/PUD, biliary disease, pancreatitis, costochondritis, anxiety. This is not an exhaustive differential.   Past Medical History / Co-morbidities / Social History: Peripheral artery disease, hyperlipidemia, tobacco use, restless legs, hypertension, diabetes, breast cancer (1991), iron deficiency anemia due to chronic blood loss, COPD, mild cognitive impairment, GERD, chronic kidney disease  Most recent echocardiogram with LVEF 65 to 70%, July 2018 History of cancer of upper lobe of left lung s/p empiric SBRT therapy in May 2024  Additional history: Chart reviewed. Pertinent results include: Appears patient's initial left sided lung cancer was diagnosed after nonspecific CP and concerning CT chest findings in the ED  Physical Exam: Physical exam performed. The pertinent findings include: Normal vital signs, no acute distress. Heart regular rate and  rhythm. Very mild wheezing, normal air movement. No peripheral edema.   Lab Tests/Imaging studies: I personally interpreted labs/imaging and the pertinent results include: No leukocytosis, hemoglobin stable compared to prior.  Low platelets, also stable.  BMP and hepatic function panel unremarkable. Troponin negative x 2.   CXR without acute abnormalities. I agree with the radiologist interpretation.  With somewhat progressive SOB and hx of lung cancer, will proceed with CT PE study to evaluate for VTE. CT PE shows stable left upper lobe mass, no other acute findings.   Cardiac monitoring: EKG obtained and interpreted by myself and attending physician which shows: NSR   Disposition: After consideration of the diagnostic results and the patients response to treatment, I feel that emergency department workup does not suggest an emergent condition requiring admission or immediate intervention beyond what has been performed at this time. Patient is to be discharged with recommendation  to follow up with PCP in regards to today's hospital visit. Chest pain is not likely of cardiac or pulmonary etiology d/t presentation, CT for PE negative, VSS, no tracheal deviation, no JVD or new murmur, RRR, breath sounds equal bilaterally, EKG without acute abnormalities, negative troponin, and negative CXR. Heart score of 4. Pt has been advised to return to the ED if CP becomes exertional, associated with diaphoresis or nausea, radiates to left jaw/arm, worsens or becomes concerning in any way. Pt appears reliable for follow up and is agreeable to discharge. Plan for d/c to home with PCP follow up if breast pain continues as she could benefit from outpatient ultrasound. No infection or acute breast complications present today. The patient is safe for discharge and has been instructed to return immediately for worsening symptoms, change in symptoms or any other concerns. Patient and her family are agreeable to this plan.    Final Clinical Impression(s) / ED Diagnoses Final diagnoses:  Breast pain, left  Shortness of breath    Rx / DC Orders ED Discharge Orders     None      Portions of this report may have been transcribed using voice recognition software. Every effort was made to ensure accuracy; however, inadvertent computerized transcription errors may be present.    Su Monks, PA-C 02/10/23 1809    Coral Spikes, DO 02/10/23 2339

## 2023-02-10 NOTE — ED Triage Notes (Signed)
Pt c/o left breast pain and bilateral abdominal pain that started "a few days ago."   Denies fever, n/v/d or other symptoms.  NAD AAOx4 in triage.

## 2023-02-13 ENCOUNTER — Encounter: Payer: Self-pay | Admitting: Internal Medicine

## 2023-02-13 ENCOUNTER — Ambulatory Visit (INDEPENDENT_AMBULATORY_CARE_PROVIDER_SITE_OTHER): Payer: Medicare Other | Admitting: Internal Medicine

## 2023-02-13 VITALS — BP 110/70 | HR 82 | Temp 98.5°F | Ht 63.0 in | Wt 164.6 lb

## 2023-02-13 DIAGNOSIS — M25512 Pain in left shoulder: Secondary | ICD-10-CM

## 2023-02-13 DIAGNOSIS — G8929 Other chronic pain: Secondary | ICD-10-CM | POA: Diagnosis not present

## 2023-02-13 DIAGNOSIS — Z2821 Immunization not carried out because of patient refusal: Secondary | ICD-10-CM

## 2023-02-13 DIAGNOSIS — N644 Mastodynia: Secondary | ICD-10-CM

## 2023-02-13 DIAGNOSIS — I119 Hypertensive heart disease without heart failure: Secondary | ICD-10-CM | POA: Diagnosis not present

## 2023-02-13 DIAGNOSIS — N1831 Chronic kidney disease, stage 3a: Secondary | ICD-10-CM | POA: Diagnosis not present

## 2023-02-13 DIAGNOSIS — R040 Epistaxis: Secondary | ICD-10-CM

## 2023-02-13 DIAGNOSIS — E1122 Type 2 diabetes mellitus with diabetic chronic kidney disease: Secondary | ICD-10-CM | POA: Diagnosis not present

## 2023-02-13 DIAGNOSIS — I700 Atherosclerosis of aorta: Secondary | ICD-10-CM

## 2023-02-13 DIAGNOSIS — D696 Thrombocytopenia, unspecified: Secondary | ICD-10-CM | POA: Diagnosis not present

## 2023-02-13 NOTE — Progress Notes (Signed)
I,Jameka J Llittleton, CMA,acting as a Neurosurgeon for Gwynneth Aliment, MD.,have documented all relevant documentation on the behalf of Gwynneth Aliment, MD,as directed by  Gwynneth Aliment, MD while in the presence of Gwynneth Aliment, MD.  Subjective:  Patient ID: Leah Wiggins , female    DOB: 1927/02/13 , 89 y.o.   MRN: [id number]  Chief Complaint  Patient presents with   Diabetes   Hypertension    HPI  The patient is here today for a diabetes and blood pressure follow-up.  She is accompanied by her daughter today. She reports compliance with meds.   She denies having palpitations and chest pains at this time. She has had two ER visits in the past two weeks. On 11/27, she went for evaluation of persistent nosebleed. She was advised that she had very low platelets. She had repeat episode last week and went to ER on 12/6. However, when she presented to the ER she c/o breast/chest pain. Her sx have since resolved.     Diabetes She presents for her follow-up diabetic visit. She has type 2 diabetes mellitus. There are no hypoglycemic associated symptoms. Pertinent negatives for diabetes include no blurred vision, no polydipsia, no polyphagia and no polyuria. There are no hypoglycemic complications. Risk factors for coronary artery disease include diabetes mellitus, dyslipidemia, hypertension, obesity, sedentary lifestyle and post-menopausal.  Hypertension This is a chronic problem. The current episode started more than 1 year ago. The problem has been gradually improving since onset. The problem is controlled. Pertinent negatives include no blurred vision. The current treatment provides moderate improvement.     Past Medical History:  Diagnosis Date   Arthritis    "shoulders" (05/04/2017)   Breast cancer, right breast (HCC) 1991   GERD (gastroesophageal reflux disease)    Hard of hearing    Hyperlipidemia    Hypertension    Memory loss    Non-stress test nonreactive, 04/24/12, normal  12/10/2012   OSA on CPAP    "don't wear it all the time" (05/04/2017)   PVD (peripheral vascular disease) (HCC)    Tobacco use 12/10/2012   Type II diabetes mellitus (HCC)      Family History  Problem Relation Age of Onset   Stroke Mother    Cancer Sister      Current Outpatient Medications:    albuterol (PROVENTIL) (2.5 MG/3ML) 0.083% nebulizer solution, Take 3 mLs (2.5 mg total) by nebulization every 4 (four) hours as needed for wheezing or shortness of breath., Disp: 75 mL, Rfl: 2   alendronate (FOSAMAX) 70 MG tablet, TAKE 1 TABLET EVERY WEDNESDAY WITH A FULL GLASS OF WATER ON AN EMPTY STOMACH, Disp: 12 tablet, Rfl: 3   ARTIFICIAL TEAR OP, Place 1 drop into both eyes 2 (two) times daily as needed (dry eyes)., Disp: , Rfl:    aspirin EC 81 MG tablet, Take 81 mg by mouth daily., Disp: , Rfl:    Blood Glucose Monitoring Suppl (FREESTYLE FREEDOM LITE) w/Device KIT, Use to check blood sugar 3 times a day. Dx code e11.65, Disp: 1 kit, Rfl: 3   budesonide (PULMICORT) 0.25 MG/2ML nebulizer solution, Take 2 mLs (0.25 mg total) by nebulization in the morning and at bedtime., Disp: 60 mL, Rfl: 11   FERROUS SULFATE PO, Take 2 tablets by mouth daily. gummy, Disp: , Rfl:    Fluocinolone Acetonide (DERMOTIC) 0.01 % OIL, Place 3 drops in ear(s) in the morning and at bedtime., Disp: 20 mL, Rfl: 0   formoterol (  PERFOROMIST) 20 MCG/2ML nebulizer solution, Take 2 mLs (20 mcg total) by nebulization 2 (two) times daily., Disp: 60 mL, Rfl: 11   glucose blood (FREESTYLE LITE) test strip, Use to check blood sugar 3 times a day. Dx code e11.65, Disp: 150 each, Rfl: 3   Multiple Vitamins-Minerals (ALIVE WOMENS 50+ PO), Take 1 tablet by mouth daily., Disp: , Rfl:    mupirocin ointment (BACTROBAN) 2 %, Apply 1 Application topically 2 (two) times daily., Disp: 22 g, Rfl: 0   pravastatin (PRAVACHOL) 40 MG tablet, Take 1 tablet (40 mg total) by mouth daily., Disp: 90 tablet, Rfl: 3   Spacer/Aero-Holding Chambers  (AEROCHAMBER PLUS) inhaler, Use as instructed, Disp: 1 each, Rfl: 2   telmisartan (MICARDIS) 40 MG tablet, TAKE 1 TABLET DAILY, Disp: 90 tablet, Rfl: 3   triamcinolone cream (KENALOG) 0.1 %, Apply 1 Application topically 2 (two) times daily., Disp: 30 g, Rfl: 0   Allergies  Allergen Reactions   Bactrim [Sulfamethoxazole-Trimethoprim] Swelling    Marked angioedema requiring hospitalization   Penicillins Other (See Comments)    Has patient had a PCN reaction causing immediate rash, facial/tongue/throat swelling, SOB or lightheadedness with hypotension: Unk Has patient had a PCN reaction causing severe rash involving mucus membranes or skin necrosis: Unk Has patient had a PCN reaction that required hospitalization: Unk Has patient had a PCN reaction occurring within the last 10 years: No If all of the above answers are "NO", then may proceed with Cephalosporin use.     Review of Systems  Constitutional: Negative.   HENT:         NOSEBLEED  Eyes: Negative.  Negative for blurred vision.  Respiratory: Negative.    Cardiovascular: Negative.   Endocrine: Negative for polydipsia, polyphagia and polyuria.  Musculoskeletal:  Positive for arthralgias.       At end of visit, she mentions having left shoulder pain. Wants to go to Ortho. She has been seen by [facility] in the past. She states she has gotten relief in the past with an injection.  Skin: Negative.   Psychiatric/Behavioral: Negative.       Today's Vitals   02/13/23 1434  BP: 110/70  Pulse: 82  Temp: 98.5 F (36.9 C)  Weight: 164 lb 9.6 oz (74.7 kg)  Height: 5\' 3"  (1.6 m)  PainSc: 0-No pain   Body mass index is 29.16 kg/m.  Wt Readings from Last 3 Encounters:  02/13/23 164 lb 9.6 oz (74.7 kg)  10/19/22 167 lb (75.8 kg)  10/10/22 165 lb 6.4 oz (75 kg)    The ASCVD Risk score (Arnett DK, et al., 2019) failed to calculate for the following reasons:   The 2019 ASCVD risk score is only valid for ages 22 to 10  Objective:   Physical Exam Vitals and nursing note reviewed.  Constitutional:      Appearance: Normal appearance.  HENT:     Head: Normocephalic and atraumatic.  Eyes:     Extraocular Movements: Extraocular movements intact.  Cardiovascular:     Rate and Rhythm: Normal rate and regular rhythm.     Heart sounds: Normal heart sounds.  Pulmonary:     Effort: Pulmonary effort is normal.     Breath sounds: Normal breath sounds.  Chest:  Breasts:    Left: Tenderness present.     Comments: Mastectomy on right Musculoskeletal:        General: Tenderness present.     Cervical back: Normal range of motion.  Skin:    General:  Skin is warm.  Neurological:     General: No focal deficit present.     Mental Status: She is alert.  Psychiatric:        Mood and Affect: Mood normal.        Behavior: Behavior normal.         Assessment And Plan:  Type 2 diabetes mellitus with stage 3a chronic kidney disease, without long-term current use of insulin (HCC) Assessment & Plan: Chronic, she is not on any meds at this time. She agrees to take meds if A1c goes above 7.0. I will check labs as below. Regarding her renal function, encouraged to avoid NSAIDs, stay hydrated and to keep BP well controlled to decrease risk of CKD progression.   Orders: -     Hemoglobin A1c  Hypertensive heart disease without heart failure Assessment & Plan: Chronic, controlled.  She will continue with telmisartan 40mg  daily. She is reminded to follow a low sodium diet. She will f/u in four to six months for re-evaluation.    Aortic atherosclerosis (HCC) Assessment & Plan: Chronic, goal LDL<70.  Importance of following heart healthy lifestyle and statin compliance was stressed to the patient. She will continue with ASA 81mg  and pravastatin 40mg  daily.     Orders: -     TSH  Epistaxis Assessment & Plan: ER records reviewed. Recurrent. Advised to get humidifier to use in bedroom. I will also refer her to ENT for further  evaluation if her sx persist.    Mastodynia of left breast Assessment & Plan: ER records reviewed, breast exam performed. I will refer her for diagnostic mammogram.   Orders: -     MM 3D DIAGNOSTIC MAMMOGRAM UNILATERAL LEFT BREAST; Future  Chronic left shoulder pain Assessment & Plan: She states she was previously followed by [facility].  She wants to get another injection for relief. She was advised of their after-hours Urgent Care. Her daughter plans to take her there later today.    Thrombocytopenia (HCC) Assessment & Plan: She has had persistently low platelets, less than 100. I will refer her to Hematology for further evaluation.   Orders: -     Ambulatory referral to Hematology / Oncology  Herpes zoster vaccination declined  Influenza vaccination declined  COVID-19 vaccination declined    Return for controlled DM check-4 months.  Patient was given opportunity to ask questions. Patient verbalized understanding of the plan and was able to repeat key elements of the plan. All questions were answered to their satisfaction.    I, Gwynneth Aliment, MD, have reviewed all documentation for this visit. The documentation on 02/13/23 for the exam, diagnosis, procedures, and orders are all accurate and complete.  IF YOU HAVE BEEN REFERRED TO A SPECIALIST, IT MAY TAKE 1-2 WEEKS TO SCHEDULE/PROCESS THE REFERRAL. IF YOU HAVE NOT HEARD FROM US/SPECIALIST IN TWO WEEKS, PLEASE GIVE [place] A CALL AT [phone].

## 2023-02-14 LAB — TSH: TSH: 2.19 u[IU]/mL (ref 0.450–4.500)

## 2023-02-14 LAB — HEMOGLOBIN A1C
Est. average glucose Bld gHb Est-mCnc: 143 mg/dL
Hgb A1c MFr Bld: 6.6 % — ABNORMAL HIGH (ref 4.8–5.6)

## 2023-02-19 DIAGNOSIS — G8929 Other chronic pain: Secondary | ICD-10-CM | POA: Insufficient documentation

## 2023-02-19 NOTE — Assessment & Plan Note (Signed)
Chronic, she is not on any meds at this time. She agrees to take meds if A1c goes above 7.0. I will check labs as below. Regarding her renal function, encouraged to avoid NSAIDs, stay hydrated and to keep BP well controlled to decrease risk of CKD progression.

## 2023-02-19 NOTE — Assessment & Plan Note (Signed)
ER records reviewed, breast exam performed. I will refer her for diagnostic mammogram.

## 2023-02-19 NOTE — Assessment & Plan Note (Signed)
She states she was previously followed by [facility].  She wants to get another injection for relief. She was advised of their after-hours Urgent Care. Her daughter plans to take her there later today.

## 2023-02-19 NOTE — Assessment & Plan Note (Signed)
Chronic, goal LDL<70.  Importance of following heart healthy lifestyle and statin compliance was stressed to the patient. She will continue with ASA 81mg  and pravastatin 40mg  daily.

## 2023-02-19 NOTE — Assessment & Plan Note (Signed)
She has had persistently low platelets, less than 100. I will refer her to Hematology for further evaluation.

## 2023-02-19 NOTE — Assessment & Plan Note (Signed)
Chronic, controlled.  She will continue with telmisartan 40mg  daily. She is reminded to follow a low sodium diet. She will f/u in four to six months for re-evaluation.

## 2023-02-19 NOTE — Assessment & Plan Note (Addendum)
ER records reviewed. Recurrent. Advised to get humidifier to use in bedroom. I will also refer her to ENT for further evaluation if her sx persist.

## 2023-03-07 NOTE — Progress Notes (Deleted)
 [facility] CONSULT NOTE  Patient Care Team: Jarold Medici, MD as PCP - General (Internal Medicine) Georgia Blonder, MD as Consulting Physician (Obstetrics and Gynecology)  ASSESSMENT & PLAN 89 y.o.female with history of *** referred to Hematology for thrombocytopenia.  Report of moderate thrombocytopenia with aggregation and potential platelet clumping.  Discussed potential diagnosis of pseudothrombocytopenia.  We will repeat labs today with citrated platelet.  No problem-specific Assessment & Plan notes found for this encounter.   CBC, blood smear to rule out clumping HIV, hepatitis C ab and CMP, LDH B12, copper , folate PT, APTT History of thrombosis with thrombocytopenia will rule out P[facility] ANA if signs or symptoms of autoimmune disease Bone marrow aspiration with biopsy if other causes ruled out and primary hematologic disorder is suspected  No orders of the defined types were placed in this encounter.  All questions were answered. The patient knows to call the clinic with any problems, questions or concerns. No barriers to learning was detected. The total time spent in the appointment was {CHL ONC TIME VISIT - DTPQU:[id number]} encounter with patients including review of chart and various tests results, discussions about plan of care and coordination of care plan  Pauletta JAYSON Chihuahua, MD 03/07/2023 12:14 PM  CHIEF COMPLAINTS/PURPOSE OF CONSULTATION:  ***  HISTORY OF PRESENTING ILLNESS:  Leah Wiggins 89 y.o. female is here because of thrombocytopenia. Her available records reviewed.  Per records patient has a history of thrombocytopenia for more 10 years.  Most recently mildly low level in clinic.  Report of platelet aggregation on several occasions.   ***She was found to have abnormal CBC from *** ***She denies recent bruising/bleeding, such as spontaneous epistaxis, hematuria, melena or hematochezia ***She denies recent excessive menorrhagia The patient  denies history of liver disease, exposure to heparin , history of cardiac murmur/prior cardiovascular surgery or recent new medications She denies prior blood or platelet transfusions  Drug induced thrombocytopenia: {Yes/No:30480221}: Heparin  {Yes/No:30480221}: Quinine as over-the-counter tablet for leg cramps or tonic water, gin and tonic {Yes/No:30480221}: Sulfonamide {Yes/No:30480221}: Acetaminophen  {Yes/No:30480221}: Cimetidine {Yes/No:30480221}: Ibuprofen or other NSAIDs {Yes/No:30480221}: Ampicillin {Yes/No:30480221}: Piperacillin {Yes/No:30480221}: Vancomycin {Yes/No:30480221}: GP IIb/IIIa inhibitor {Yes/No:30480221}: Seizure medication: Carbamazepine, phenytoin  Infectious cause: {Yes/No:30480221}: History of HIV {Yes/No:30480221}: Hepatitis C {Yes/No:30480221}: EBV or infectious mononucleosis {Yes/No:30480221}: H pylori {Yes/No:30480221}: Recent sepsis or infection {Yes/No:30480221}: Traveling history suggest parasites exposure  {Yes/No:30480221}: Heavy alcohol use {Yes/No:30480221}: Dietary imbalance: Vitamin B12, folate, copper  deficiency {Yes/No:30480221}: Autoimmune disease {Yes/No:30480221}: Pregnancy {Yes/No:30480221}: Family history of thrombocytopenia {Yes/No:30480221}: History of thrombosis {Yes/No:30480221}: History of bleeding   MEDICAL HISTORY:  Past Medical History:  Diagnosis Date   Arthritis    shoulders (05/04/2017)   Breast cancer, right breast (HCC) 1991   GERD (gastroesophageal reflux disease)    Hard of hearing    Hyperlipidemia    Hypertension    Memory loss    Non-stress test nonreactive, 04/24/12, normal 12/10/2012   OSA on CPAP    don't wear it all the time (05/04/2017)   PVD (peripheral vascular disease) (HCC)    Tobacco use 12/10/2012   Type II diabetes mellitus (HCC)     SURGICAL HISTORY: Past Surgical History:  Procedure Laterality Date   APPENDECTOMY     BIOPSY  01/19/2018   Procedure: BIOPSY;  Surgeon: Rollin Dover, MD;   Location: [facility];  Service: Endoscopy;;   BREAST BIOPSY Right 1991   CARDIOVASCULAR STRESS TEST  04/24/2012   No significant ST segment change suggestive of ischemia.   CATARACT EXTRACTION, BILATERAL Bilateral  COLONOSCOPY WITH PROPOFOL  N/A 01/19/2018   Procedure: COLONOSCOPY WITH PROPOFOL ;  Surgeon: Rollin Dover, MD;  Location: [facility];  Service: Endoscopy;  Laterality: N/A;   ESOPHAGOGASTRODUODENOSCOPY (EGD) WITH PROPOFOL  N/A 01/19/2018   Procedure: ESOPHAGOGASTRODUODENOSCOPY (EGD) WITH PROPOFOL ;  Surgeon: Rollin Dover, MD;  Location: [facility];  Service: Endoscopy;  Laterality: N/A;   JOINT REPLACEMENT     LOWER EXTREMITY ARTERIAL DOPPLER Bilateral 05/07/2012   Left ABI-demonstrated mild arterial insufficiency. Right CIA 50-69% diameter reduction. Right SFA less than 50% diameter reduction. Left SFA 70-99% diameter reduction. Bilateral Runoff-Posterior tibial arteries appeared occluded.   MASTECTOMY PARTIAL / LUMPECTOMY W/ AXILLARY LYMPHADENECTOMY Right 1991    TOTAL HIP ARTHROPLASTY Right 2011    SOCIAL HISTORY: Social History   Socioeconomic History   Marital status: Widowed    Spouse name: Not on file   Number of children: 8   Years of education: 8th grade   Highest education level: Not on file  Occupational History   Occupation: retired  Tobacco Use   Smoking status: Former    Current packs/day: 0.00    Average packs/day: 0.5 packs/day for 71.0 years (35.5 ttl pk-yrs)    Types: Cigarettes    Start date: 03/08/1947    Quit date: 2020    Years since quitting: 5.0   Smokeless tobacco: Never   Tobacco comments:    has tried cold turkey  Vaping Use   Vaping status: Never Used  Substance and Sexual Activity   Alcohol use: No    Alcohol/week: 0.0 standard drinks of alcohol   Drug use: No   Sexual activity: Not Currently    Partners: Male    Birth control/protection: Post-menopausal  Other Topics Concern   Not on file  Social History Narrative   Lives at  home with her son.   She had eight children (two sets of twins).   No daily use of caffeine.   Right-handed.   Social Drivers of Corporate Investment Banker Strain: Low Risk  (03/09/2022)   Overall Financial Resource Strain (CARDIA)    Difficulty of Paying Living Expenses: Not hard at all  Food Insecurity: No Food Insecurity (10/19/2022)   Hunger Vital Sign    Worried About Running Out of Food in the Last Year: Never true    Ran Out of Food in the Last Year: Never true  Transportation Needs: No Transportation Needs (10/19/2022)   PRAPARE - Administrator, Civil Service (Medical): No    Lack of Transportation (Non-Medical): No  Physical Activity: Inactive (03/09/2022)   Exercise Vital Sign    Days of Exercise per Week: 0 days    Minutes of Exercise per Session: 0 min  Stress: No Stress Concern Present (03/09/2022)   [employer] of Occupational Health - Occupational Stress Questionnaire    Feeling of Stress : Not at all  Social Connections: Not on file  Intimate Partner Violence: Not At Risk (10/19/2022)   Humiliation, Afraid, Rape, and Kick questionnaire    Fear of Current or Ex-Partner: No    Emotionally Abused: No    Physically Abused: No    Sexually Abused: No    FAMILY HISTORY: Family History  Problem Relation Age of Onset   Stroke Mother    Cancer Sister     ALLERGIES:  is allergic to bactrim [sulfamethoxazole-trimethoprim] and penicillins.  MEDICATIONS:  Current Outpatient Medications  Medication Sig Dispense Refill   albuterol  (PROVENTIL ) (2.5 MG/3ML) 0.083% nebulizer solution Take 3 mLs (2.5 mg total)  by nebulization every 4 (four) hours as needed for wheezing or shortness of breath. 75 mL 2   alendronate  (FOSAMAX ) 70 MG tablet TAKE 1 TABLET EVERY WEDNESDAY WITH A FULL GLASS OF WATER ON AN EMPTY STOMACH 12 tablet 3   ARTIFICIAL TEAR OP Place 1 drop into both eyes 2 (two) times daily as needed (dry eyes).     aspirin  EC 81 MG tablet Take 81 mg by mouth  daily.     Blood Glucose Monitoring Suppl (FREESTYLE FREEDOM LITE) w/Device KIT Use to check blood sugar 3 times a day. Dx code e11.65 1 kit 3   budesonide  (PULMICORT ) 0.25 MG/2ML nebulizer solution Take 2 mLs (0.25 mg total) by nebulization in the morning and at bedtime. 60 mL 11   FERROUS SULFATE  PO Take 2 tablets by mouth daily. gummy     Fluocinolone  Acetonide (DERMOTIC ) 0.01 % OIL Place 3 drops in ear(s) in the morning and at bedtime. 20 mL 0   formoterol  (PERFOROMIST ) 20 MCG/2ML nebulizer solution Take 2 mLs (20 mcg total) by nebulization 2 (two) times daily. 60 mL 11   glucose blood (FREESTYLE LITE) test strip Use to check blood sugar 3 times a day. Dx code e11.65 150 each 3   Multiple Vitamins-Minerals (ALIVE WOMENS 50+ PO) Take 1 tablet by mouth daily.     mupirocin  ointment (BACTROBAN ) 2 % Apply 1 Application topically 2 (two) times daily. 22 g 0   pravastatin  (PRAVACHOL ) 40 MG tablet Take 1 tablet (40 mg total) by mouth daily. 90 tablet 3   Spacer/Aero-Holding Chambers (AEROCHAMBER PLUS) inhaler Use as instructed 1 each 2   telmisartan  (MICARDIS ) 40 MG tablet TAKE 1 TABLET DAILY 90 tablet 3   triamcinolone  cream (KENALOG ) 0.1 % Apply 1 Application topically 2 (two) times daily. 30 g 0   No current facility-administered medications for this visit.    REVIEW OF SYSTEMS:   All relevant systems were reviewed with the patient and are negative.  PHYSICAL EXAMINATION: ECOG PERFORMANCE STATUS: {CHL ONC ECOG [phone]}  There were no vitals filed for this visit. There were no vitals filed for this visit.  GENERAL: alert, no distress and comfortable SKIN: skin color normal and no bruising or petechiae on exposed skin EYES: normal, sclera clear OROPHARYNX: no exudate  NECK: No palpable mass LYMPH:  no palpable cervical, axillary or inguinal lymphadenopathy  LUNGS: clear to auscultation and percussion with normal breathing effort HEART: regular rate & rhythm and no murmurs   ABDOMEN: abdomen soft, non-tender and nondistended. Musculoskeletal: no edema NEURO: no focal motor/sensory deficits  LABORATORY DATA:  I have reviewed the data as listed  RADIOGRAPHIC STUDIES: I have personally reviewed the radiological images as listed and agreed with the findings in the report. CT Angio Chest PE W and/or Wo Contrast Result Date: 02/10/2023 CLINICAL DATA:  Left breast pain bilateral abdominal pain EXAM: CT ANGIOGRAPHY CHEST WITH CONTRAST TECHNIQUE: Multidetector CT imaging of the chest was performed using the standard protocol during bolus administration of intravenous contrast. Multiplanar CT image reconstructions and MIPs were obtained to evaluate the vascular anatomy. RADIATION DOSE REDUCTION: This exam was performed according to the departmental dose-optimization program which includes automated exposure control, adjustment of the mA and/or kV according to patient size and/or use of iterative reconstruction technique. CONTRAST:  75mL OMNIPAQUE  IOHEXOL  350 MG/ML SOLN COMPARISON:  Chest x-ray 02/10/2023, CT chest 10/13/2022, 12/13/2021 FINDINGS: Cardiovascular: Satisfactory opacification of the pulmonary arteries to the segmental level. No evidence of pulmonary embolism. Moderate aortic atherosclerosis.  No aneurysm. Coronary vascular calcification. Normal cardiac size. No pericardial effusion. Mediastinum/Nodes: Midline trachea. No thyroid  mass. Calcified mediastinal and hilar nodes compatible with prior granulomatous disease. No significantly enlarged lymph nodes on today study. Esophagus within normal limits. Lungs/Pleura: Emphysema. No acute airspace disease, pleural effusion or pneumothorax. Left upper lobe spiculated mass measuring about 2.3 by 1.6 cm on series 6, image 39 and stable as compared with 10/13/2022, decreased compared to exams prior to that. Surrounding bandlike densities consistent with scarring. Upper Abdomen: No acute finding. Numerous pancreatic calcifications  consistent with chronic pancreatitis. Musculoskeletal: Right mastectomy. No acute or suspicious osseous abnormality Review of the MIP images confirms the above findings. IMPRESSION: 1. Negative for acute pulmonary embolus. 2. Emphysema. 3. Left upper lobe spiculated mass measuring up to 2.3 cm, stable as compared with 10/13/2022, decreased compared to exams prior to that. Surrounding band like densities presumably due to scarring. 4. Chronic pancreatitis. Aortic Atherosclerosis (ICD10-I70.0) and Emphysema (ICD10-J43.9). Electronically Signed   By: Luke Bun M.D.   On: 02/10/2023 17:53   DG Chest 2 View Result Date: 02/10/2023 CLINICAL DATA:  Chest pain. EXAM: CHEST - 2 VIEW COMPARISON:  06/08/2021. FINDINGS: Low lung volume. There are stable nonspecific predominantly linear opacities throughout bilateral lungs, favored to represent underlying chronic lung parenchymal fibrosis/scarring. Bilateral lung fields are otherwise clear. No acute consolidation or lung collapse. No pulmonary edema. Bilateral costophrenic angles are clear. Stable cardio-mediastinal silhouette. No acute osseous abnormalities. Marked degenerative changes of bilateral glenohumeral joints noted, left more than right. The soft tissues are within normal limits. IMPRESSION: No active cardiopulmonary disease. Electronically Signed   By: Ree Molt M.D.   On: 02/10/2023 15:28

## 2023-03-09 ENCOUNTER — Encounter: Payer: Self-pay | Admitting: *Deleted

## 2023-03-09 ENCOUNTER — Inpatient Hospital Stay: Payer: Medicare Other

## 2023-03-09 ENCOUNTER — Inpatient Hospital Stay: Payer: Medicare Other | Attending: Oncology | Admitting: Oncology

## 2023-03-09 VITALS — BP 165/71 | Resp 15 | Wt 169.0 lb

## 2023-03-09 DIAGNOSIS — D696 Thrombocytopenia, unspecified: Secondary | ICD-10-CM | POA: Insufficient documentation

## 2023-03-09 DIAGNOSIS — Z79899 Other long term (current) drug therapy: Secondary | ICD-10-CM | POA: Diagnosis not present

## 2023-03-09 DIAGNOSIS — N644 Mastodynia: Secondary | ICD-10-CM | POA: Diagnosis not present

## 2023-03-09 DIAGNOSIS — D500 Iron deficiency anemia secondary to blood loss (chronic): Secondary | ICD-10-CM | POA: Insufficient documentation

## 2023-03-09 DIAGNOSIS — Z87891 Personal history of nicotine dependence: Secondary | ICD-10-CM | POA: Insufficient documentation

## 2023-03-09 DIAGNOSIS — E119 Type 2 diabetes mellitus without complications: Secondary | ICD-10-CM | POA: Insufficient documentation

## 2023-03-09 DIAGNOSIS — I10 Essential (primary) hypertension: Secondary | ICD-10-CM | POA: Diagnosis not present

## 2023-03-09 LAB — CBC WITH DIFFERENTIAL/PLATELET
Abs Immature Granulocytes: 0.02 10*3/uL (ref 0.00–0.07)
Basophils Absolute: 0 10*3/uL (ref 0.0–0.1)
Basophils Relative: 1 %
Eosinophils Absolute: 0.1 10*3/uL (ref 0.0–0.5)
Eosinophils Relative: 2 %
HCT: 35.6 % — ABNORMAL LOW (ref 36.0–46.0)
Hemoglobin: 11.6 g/dL — ABNORMAL LOW (ref 12.0–15.0)
Immature Granulocytes: 0 %
Lymphocytes Relative: 15 %
Lymphs Abs: 0.8 10*3/uL (ref 0.7–4.0)
MCH: 24.9 pg — ABNORMAL LOW (ref 26.0–34.0)
MCHC: 32.6 g/dL (ref 30.0–36.0)
MCV: 76.6 fL — ABNORMAL LOW (ref 80.0–100.0)
Monocytes Absolute: 0.5 10*3/uL (ref 0.1–1.0)
Monocytes Relative: 10 %
Neutro Abs: 3.8 10*3/uL (ref 1.7–7.7)
Neutrophils Relative %: 72 %
Platelets: UNDETERMINED 10*3/uL (ref 150–400)
RBC: 4.65 MIL/uL (ref 3.87–5.11)
RDW: 18.3 % — ABNORMAL HIGH (ref 11.5–15.5)
Smear Review: UNDETERMINED
WBC: 5.2 10*3/uL (ref 4.0–10.5)
nRBC: 0 % (ref 0.0–0.2)

## 2023-03-09 LAB — CMP (CANCER CENTER ONLY)
ALT: 15 U/L (ref 0–44)
AST: 19 U/L (ref 15–41)
Albumin: 3.5 g/dL (ref 3.5–5.0)
Alkaline Phosphatase: 66 U/L (ref 38–126)
Anion gap: 5 (ref 5–15)
BUN: 19 mg/dL (ref 8–23)
CO2: 28 mmol/L (ref 22–32)
Calcium: 8.6 mg/dL — ABNORMAL LOW (ref 8.9–10.3)
Chloride: 106 mmol/L (ref 98–111)
Creatinine: 0.95 mg/dL (ref 0.44–1.00)
GFR, Estimated: 56 mL/min — ABNORMAL LOW (ref >60)
Glucose, Bld: 101 mg/dL — ABNORMAL HIGH (ref 70–99)
Potassium: 3.3 mmol/L — ABNORMAL LOW (ref 3.5–5.1)
Sodium: 139 mmol/L (ref 135–145)
Total Bilirubin: 0.4 mg/dL (ref 0.0–1.2)
Total Protein: 6.9 g/dL (ref 6.5–8.1)

## 2023-03-09 LAB — PROTIME-INR
INR: 1 (ref 0.8–1.2)
Prothrombin Time: 13.7 s (ref 11.4–15.2)

## 2023-03-09 LAB — APTT: aPTT: 22 s — ABNORMAL LOW (ref 24–36)

## 2023-03-09 LAB — IRON AND IRON BINDING CAPACITY (CC-WL,HP ONLY)
Iron: 58 ug/dL (ref 28–170)
Saturation Ratios: 20 % (ref 10.4–31.8)
TIBC: 294 ug/dL (ref 250–450)
UIBC: 236 ug/dL

## 2023-03-09 LAB — HM MAMMOGRAPHY

## 2023-03-09 LAB — LACTATE DEHYDROGENASE: LDH: 140 U/L (ref 98–192)

## 2023-03-09 LAB — VITAMIN B12: Vitamin B-12: 707 pg/mL (ref 180–914)

## 2023-03-09 LAB — FOLATE: Folate: 27.7 ng/mL (ref >5.9)

## 2023-03-09 NOTE — Progress Notes (Signed)
 [facility]  HEMATOLOGY CLINIC CONSULTATION NOTE   PATIENT NAME: Leah Wiggins   MR#: [id number] DOB: 08-11-1926  DATE OF SERVICE: 03/09/2023   REFERRING PHYSICIAN  Jarold Medici, MD   Patient Care Team: Jarold Medici, MD as PCP - General (Internal Medicine) Georgia Blonder, MD as Consulting Physician (Obstetrics and Gynecology)   REASON FOR CONSULTATION/ CHIEF COMPLAINT:  Evaluation of chronic thrombocytopenia  ASSESSMENT & PLAN:  Leah Wiggins is a 89 y.o. lady with a past medical history of hypertension, dyslipidemia, OSA, GERD, type 2 diabetes mellitus, remote history of right breast cancer in 1991, left upper lobe lung nodule concerning for malignancy (not biopsy-proven), status post SBRT in April to May 2024, was referred to our service for evaluation of chronic thrombocytopenia.    Thrombocytopenia (HCC) Chronic low platelet count since 2011, with recent nosebleeds. No other bleeding symptoms. Not likely a bone marrow issue given stable white count and long-standing nature of thrombocytopenia. 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  -On labs today, there was evidence of platelet clumping.  We will obtain platelet count in citrated tube for further evaluation.  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  Discussed other possible etiologies including immune thrombocytopenia, nutrient deficiency including iron deficiency.  -B12, folate, TSH, iron studies, LDH, PT, PTT are all within normal limits today.  -Additional testing including ANA pending.  -Advise use of humidifier and saline nose drops to minimize risk of nosebleeds.  -Will follow-up on platelet results from citrated tube to confirm the exact platelet count.  No additional hematological intervention needed currently.  Plan to reevaluate in 3 months.  Iron deficiency anemia due to chronic blood  loss History of iron deficiency with ongoing oral iron supplementation. -Iron levels are normal currently.  Continue oral iron supplementation. -We will consider IV iron infusion if levels are significantly low in the future.  Currently no indication for this.  I reviewed lab results and outside records for this visit and discussed relevant results with the patient. Diagnosis, plan of care and treatment options were also discussed in detail with the patient. Opportunity provided to ask questions and answers provided to her apparent satisfaction. Provided instructions to call our clinic with any problems, questions or concerns prior to return visit. I recommended to continue follow-up with PCP and sub-specialists. She verbalized understanding and agreed with the plan. No barriers to learning was detected.  Chinita Patten, MD  03/09/2023 3:28 PM  [facility] - A DEPT OF [facility] [address] [place] [address] Dept: [phone] Dept Fax: [phone]   HISTORY OF PRESENT ILLNESS:  Discussed the use of AI scribe software for clinical note transcription with the patient, who gave verbal consent to proceed.   On review of records, patient has had chronic thrombocytopenia at least since 2011 with platelet count ranging anywhere between 48,000 (November 2024)-200,000.  Report of moderate thrombocytopenia with aggregation and potential platelet clumping on multiple occasions.  She was evaluated by at least 3 hematologists in the past, most recently by Dr. Donnajean in 2019.  Workup was indicative of iron deficiency state and she has been on iron supplementation chronically.  There was also a possibility of immune thrombocytopenia.  Recently her platelet count has been lower around 48,000 in November 2024 and 75,000 on 02/10/2023 and hence a referral was sent to us  again for further  evaluation of thrombocytopenia.  The patient has been  experiencing nosebleeds, which occurred twice on the same day. The patient denies any other bleeding problems. The patient also has iron deficiency and is currently taking iron supplements. The patient's daughter mentioned that she gives the patient a liquid multivitamin and makes her fresh juices, including beet juice.   MEDICAL HISTORY Past Medical History:  Diagnosis Date   Arthritis    shoulders (05/04/2017)   Breast cancer, right breast (HCC) 1991   GERD (gastroesophageal reflux disease)    Hard of hearing    Hyperlipidemia    Hypertension    Memory loss    Non-stress test nonreactive, 04/24/12, normal 12/10/2012   OSA on CPAP    don't wear it all the time (05/04/2017)   PVD (peripheral vascular disease) (HCC)    Tobacco use 12/10/2012   Type II diabetes mellitus (HCC)      SURGICAL HISTORY Past Surgical History:  Procedure Laterality Date   APPENDECTOMY     BIOPSY  01/19/2018   Procedure: BIOPSY;  Surgeon: Rollin Dover, MD;  Location: [facility];  Service: Endoscopy;;   BREAST BIOPSY Right 1991   CARDIOVASCULAR STRESS TEST  04/24/2012   No significant ST segment change suggestive of ischemia.   CATARACT EXTRACTION, BILATERAL Bilateral    COLONOSCOPY WITH PROPOFOL  N/A 01/19/2018   Procedure: COLONOSCOPY WITH PROPOFOL ;  Surgeon: Rollin Dover, MD;  Location: [facility];  Service: Endoscopy;  Laterality: N/A;   ESOPHAGOGASTRODUODENOSCOPY (EGD) WITH PROPOFOL  N/A 01/19/2018   Procedure: ESOPHAGOGASTRODUODENOSCOPY (EGD) WITH PROPOFOL ;  Surgeon: Rollin Dover, MD;  Location: [facility];  Service: Endoscopy;  Laterality: N/A;   JOINT REPLACEMENT     LOWER EXTREMITY ARTERIAL DOPPLER Bilateral 05/07/2012   Left ABI-demonstrated mild arterial insufficiency. Right CIA 50-69% diameter reduction. Right SFA less than 50% diameter reduction. Left SFA 70-99% diameter reduction. Bilateral Runoff-Posterior tibial arteries appeared occluded.   MASTECTOMY PARTIAL / LUMPECTOMY W/ AXILLARY  LYMPHADENECTOMY Right 1991    TOTAL HIP ARTHROPLASTY Right 2011     SOCIAL HISTORY: She reports that she quit smoking about 5 years ago. Her smoking use included cigarettes. She started smoking about 76 years ago. She has a 35.5 pack-year smoking history. She has never used smokeless tobacco. She reports that she does not drink alcohol and does not use drugs. Social History   Socioeconomic History   Marital status: Widowed    Spouse name: Not on file   Number of children: 8   Years of education: 8th grade   Highest education level: Not on file  Occupational History   Occupation: retired  Tobacco Use   Smoking status: Former    Current packs/day: 0.00    Average packs/day: 0.5 packs/day for 71.0 years (35.5 ttl pk-yrs)    Types: Cigarettes    Start date: 03/08/1947    Quit date: 2020    Years since quitting: 5.0   Smokeless tobacco: Never   Tobacco comments:    has tried cold turkey  Vaping Use   Vaping status: Never Used  Substance and Sexual Activity   Alcohol use: No    Alcohol/week: 0.0 standard drinks of alcohol   Drug use: No   Sexual activity: Not Currently    Partners: Male    Birth control/protection: Post-menopausal  Other Topics Concern   Not on file  Social History Narrative   Lives at home with her son.   She had eight children (two sets of twins).   No daily use of caffeine.  Right-handed.   Social Drivers of Corporate Investment Banker Strain: Low Risk  (03/09/2022)   Overall Financial Resource Strain (CARDIA)    Difficulty of Paying Living Expenses: Not hard at all  Food Insecurity: No Food Insecurity (10/19/2022)   Hunger Vital Sign    Worried About Running Out of Food in the Last Year: Never true    Ran Out of Food in the Last Year: Never true  Transportation Needs: No Transportation Needs (10/19/2022)   PRAPARE - Administrator, Civil Service (Medical): No    Lack of Transportation (Non-Medical): No  Physical Activity: Inactive  (03/09/2022)   Exercise Vital Sign    Days of Exercise per Week: 0 days    Minutes of Exercise per Session: 0 min  Stress: No Stress Concern Present (03/09/2022)   [employer] of Occupational Health - Occupational Stress Questionnaire    Feeling of Stress : Not at all  Social Connections: Not on file  Intimate Partner Violence: Not At Risk (10/19/2022)   Humiliation, Afraid, Rape, and Kick questionnaire    Fear of Current or Ex-Partner: No    Emotionally Abused: No    Physically Abused: No    Sexually Abused: No    FAMILY HISTORY: Her family history includes Cancer in her sister; Stroke in her mother.  CURRENT MEDICATIONS   Current Outpatient Medications  Medication Instructions   albuterol  (PROVENTIL ) 2.5 mg, Nebulization, Every 4 hours PRN   alendronate  (FOSAMAX ) 70 MG tablet TAKE 1 TABLET EVERY WEDNESDAY WITH A FULL GLASS OF WATER ON AN EMPTY STOMACH   ARTIFICIAL TEAR OP 1 drop, 2 times daily PRN   aspirin  EC 81 mg, Daily   Blood Glucose Monitoring Suppl (FREESTYLE FREEDOM LITE) w/Device KIT Use to check blood sugar 3 times a day. Dx code e11.65   budesonide  (PULMICORT ) 0.25 mg, Nebulization, 2 times daily   FERROUS SULFATE  PO 2 tablets, Daily   Fluocinolone  Acetonide (DERMOTIC ) 0.01 % OIL 3 drops, OTIC (EAR), 2 times daily   formoterol  (PERFOROMIST ) 20 mcg, Nebulization, 2 times daily   glucose blood (FREESTYLE LITE) test strip Use to check blood sugar 3 times a day. Dx code e11.65    mupirocin  ointment (BACTROBAN ) 2 % 1 Application, Topical, 2 times daily   pravastatin  (PRAVACHOL ) 40 mg, Oral, Daily   Spacer/Aero-Holding Chambers (AEROCHAMBER PLUS) inhaler Use as instructed   telmisartan  (MICARDIS ) 40 mg, Oral, Daily   triamcinolone  cream (KENALOG ) 0.1 % 1 Application, Topical, 2 times daily     ALLERGIES  She is allergic to bactrim [sulfamethoxazole-trimethoprim] and penicillins.  REVIEW OF SYSTEMS:  Review of Systems - Oncology   Rest of the pertinent review of  systems is unremarkable except as mentioned above in HPI.  PHYSICAL EXAMINATION:  ECOG PERFORMANCE STATUS: 2 - Symptomatic, <50% confined to bed  Vitals:   03/09/23 1457  BP: (!) 165/71  Resp: 15  SpO2: 96%   Filed Weights   03/09/23 1457  Weight: 169 lb (76.7 kg)    Physical Exam Constitutional:      General: She is not in acute distress.    Appearance: Normal appearance.  HENT:     Head: Normocephalic and atraumatic.  Eyes:     General: No scleral icterus.    Conjunctiva/sclera: Conjunctivae normal.  Cardiovascular:     Rate and Rhythm: Normal rate and regular rhythm.     Heart sounds: Normal heart sounds.  Pulmonary:     Effort: Pulmonary effort is normal.  Breath sounds: Normal breath sounds.  Abdominal:     General: There is no distension.  Musculoskeletal:     Right lower leg: No edema.     Left lower leg: No edema.  Neurological:     General: No focal deficit present.     Mental Status: She is alert and oriented to person, place, and time.  Psychiatric:        Mood and Affect: Mood normal.        Behavior: Behavior normal.        Thought Content: Thought content normal.      LABORATORY DATA:   I have reviewed the data as listed.  Results for orders placed or performed in visit on 03/09/23  CBC with Differential/Platelet  Result Value Ref Range   WBC 5.2 4.0 - 10.5 K/uL   RBC 4.65 3.87 - 5.11 MIL/uL   Hemoglobin 11.6 (L) 12.0 - 15.0 g/dL   HCT 64.3 (L) 63.9 - 53.9 %   MCV 76.6 (L) 80.0 - 100.0 fL   MCH 24.9 (L) 26.0 - 34.0 pg   MCHC 32.6 30.0 - 36.0 g/dL   RDW 81.6 (H) 88.4 - 84.4 %   Platelets PLATELET CLUMPS NOTED ON SMEAR, UNABLE TO ESTIMATE 150 - 400 K/uL   nRBC 0.0 0.0 - 0.2 %   Neutrophils Relative % 72 %   Neutro Abs 3.8 1.7 - 7.7 K/uL   Lymphocytes Relative 15 %   Lymphs Abs 0.8 0.7 - 4.0 K/uL   Monocytes Relative 10 %   Monocytes Absolute 0.5 0.1 - 1.0 K/uL   Eosinophils Relative 2 %   Eosinophils Absolute 0.1 0.0 - 0.5 K/uL    Basophils Relative 1 %   Basophils Absolute 0.0 0.0 - 0.1 K/uL   WBC Morphology VACUOLATED NEUTROPHILS    Smear Review PLATELET CLUMPS NOTED ON SMEAR, UNABLE TO ESTIMATE    Immature Granulocytes 0 %   Abs Immature Granulocytes 0.02 0.00 - 0.07 K/uL   Ovalocytes PRESENT   Iron and Iron Binding Capacity ([facility] only)  Result Value Ref Range   Iron 58 28 - 170 ug/dL   TIBC 705 749 - 549 ug/dL   Saturation Ratios 20 10.4 - 31.8 %   UIBC 236 ug/dL  Ferritin  Result Value Ref Range   Ferritin 30 11 - 307 ng/mL  Vitamin B12  Result Value Ref Range   Vitamin B-12 707 180 - 914 pg/mL  TSH  Result Value Ref Range   TSH 1.365 0.350 - 4.500 uIU/mL  Folate  Result Value Ref Range   Folate 27.7 >5.9 ng/mL  Lactate dehydrogenase  Result Value Ref Range   LDH 140 98 - 192 U/L  Protime-INR  Result Value Ref Range   Prothrombin Time 13.7 11.4 - 15.2 seconds   INR 1.0 0.8 - 1.2  APTT  Result Value Ref Range   aPTT 22 (L) 24 - 36 seconds  CMP (Cancer Center only)  Result Value Ref Range   Sodium 139 135 - 145 mmol/L   Potassium 3.3 (L) 3.5 - 5.1 mmol/L   Chloride 106 98 - 111 mmol/L   CO2 28 22 - 32 mmol/L   Glucose, Bld 101 (H) 70 - 99 mg/dL   BUN 19 8 - 23 mg/dL   Creatinine 9.04 9.55 - 1.00 mg/dL   Calcium  8.6 (L) 8.9 - 10.3 mg/dL   Total Protein 6.9 6.5 - 8.1 g/dL   Albumin 3.5 3.5 - 5.0 g/dL  AST 19 15 - 41 U/L   ALT 15 0 - 44 U/L   Alkaline Phosphatase 66 38 - 126 U/L   Total Bilirubin 0.4 0.0 - 1.2 mg/dL   GFR, Estimated 56 (L) >60 mL/min   Anion gap 5 5 - 15     RADIOGRAPHIC STUDIES:  I have personally reviewed the radiological images as listed and agreed with the findings in the report.  CT Angio Chest PE W and/or Wo Contrast Result Date: 02/10/2023 CLINICAL DATA:  Left breast pain bilateral abdominal pain EXAM: CT ANGIOGRAPHY CHEST WITH CONTRAST TECHNIQUE: Multidetector CT imaging of the chest was performed using the standard protocol during bolus administration  of intravenous contrast. Multiplanar CT image reconstructions and MIPs were obtained to evaluate the vascular anatomy. RADIATION DOSE REDUCTION: This exam was performed according to the departmental dose-optimization program which includes automated exposure control, adjustment of the mA and/or kV according to patient size and/or use of iterative reconstruction technique. CONTRAST:  75mL OMNIPAQUE  IOHEXOL  350 MG/ML SOLN COMPARISON:  Chest x-ray 02/10/2023, CT chest 10/13/2022, 12/13/2021 FINDINGS: Cardiovascular: Satisfactory opacification of the pulmonary arteries to the segmental level. No evidence of pulmonary embolism. Moderate aortic atherosclerosis. No aneurysm. Coronary vascular calcification. Normal cardiac size. No pericardial effusion. Mediastinum/Nodes: Midline trachea. No thyroid  mass. Calcified mediastinal and hilar nodes compatible with prior granulomatous disease. No significantly enlarged lymph nodes on today study. Esophagus within normal limits. Lungs/Pleura: Emphysema. No acute airspace disease, pleural effusion or pneumothorax. Left upper lobe spiculated mass measuring about 2.3 by 1.6 cm on series 6, image 39 and stable as compared with 10/13/2022, decreased compared to exams prior to that. Surrounding bandlike densities consistent with scarring. Upper Abdomen: No acute finding. Numerous pancreatic calcifications consistent with chronic pancreatitis. Musculoskeletal: Right mastectomy. No acute or suspicious osseous abnormality Review of the MIP images confirms the above findings. IMPRESSION: 1. Negative for acute pulmonary embolus. 2. Emphysema. 3. Left upper lobe spiculated mass measuring up to 2.3 cm, stable as compared with 10/13/2022, decreased compared to exams prior to that. Surrounding band like densities presumably due to scarring. 4. Chronic pancreatitis. Aortic Atherosclerosis (ICD10-I70.0) and Emphysema (ICD10-J43.9). Electronically Signed   By: Luke Bun M.D.   On: 02/10/2023  17:53   DG Chest 2 View Result Date: 02/10/2023 CLINICAL DATA:  Chest pain. EXAM: CHEST - 2 VIEW COMPARISON:  06/08/2021. FINDINGS: Low lung volume. There are stable nonspecific predominantly linear opacities throughout bilateral lungs, favored to represent underlying chronic lung parenchymal fibrosis/scarring. Bilateral lung fields are otherwise clear. No acute consolidation or lung collapse. No pulmonary edema. Bilateral costophrenic angles are clear. Stable cardio-mediastinal silhouette. No acute osseous abnormalities. Marked degenerative changes of bilateral glenohumeral joints noted, left more than right. The soft tissues are within normal limits. IMPRESSION: No active cardiopulmonary disease. Electronically Signed   By: Ree Molt M.D.   On: 02/10/2023 15:28     Orders Placed This Encounter  Procedures   CBC with Differential/Platelet    Standing Status:   Future    Number of Occurrences:   1    Expiration Date:   03/08/2024   Iron and Iron Binding Capacity ([facility] only)    Standing Status:   Future    Number of Occurrences:   1    Expiration Date:   03/08/2024   Ferritin    Standing Status:   Future    Number of Occurrences:   1    Expiration Date:   03/08/2024   Vitamin B12    Standing  Status:   Future    Number of Occurrences:   1    Expiration Date:   03/08/2024   TSH    Standing Status:   Future    Number of Occurrences:   1    Expiration Date:   03/08/2024   Folate    Standing Status:   Future    Number of Occurrences:   1    Expiration Date:   03/08/2024   Lactate dehydrogenase    Standing Status:   Future    Number of Occurrences:   1    Expiration Date:   03/08/2024   Methylmalonic acid, serum    Standing Status:   Future    Number of Occurrences:   1    Expiration Date:   03/08/2024   ANA w/Reflex if Positive    Standing Status:   Future    Number of Occurrences:   1    Expiration Date:   03/08/2024   Protime-INR    Standing Status:   Future    Number of  Occurrences:   1    Expiration Date:   03/08/2024   APTT    Standing Status:   Future    Number of Occurrences:   1    Expiration Date:   03/08/2024   Copper , serum    Standing Status:   Future    Number of Occurrences:   1    Expiration Date:   03/08/2024   WBC/PLT in Citrate    Standing Status:   Future    Expiration Date:   03/08/2024    Future Appointments  Date Time Provider Department Center  06/15/2023  2:40 PM Jarold Medici, MD [facility] None  10/12/2023 11:00 AM Jarold Medici, MD [facility] None  10/19/2023 11:00 AM Shannon Agent, MD [facility] None    I spent a total of 55 minutes during this encounter with the patient including review of chart and various tests results, discussions about plan of care and coordination of care plan.  This document was completed utilizing speech recognition software. Grammatical errors, random word insertions, pronoun errors, and incomplete sentences are an occasional consequence of this system due to software limitations, ambient noise, and hardware issues. Any formal questions or concerns about the content, text or information contained within the body of this dictation should be directly addressed to the provider for clarification.

## 2023-03-10 ENCOUNTER — Encounter: Payer: Self-pay | Admitting: Oncology

## 2023-03-10 ENCOUNTER — Telehealth: Payer: Self-pay | Admitting: Oncology

## 2023-03-10 ENCOUNTER — Encounter: Payer: Self-pay | Admitting: Internal Medicine

## 2023-03-10 LAB — TSH: TSH: 1.365 u[IU]/mL (ref 0.350–4.500)

## 2023-03-10 LAB — FERRITIN: Ferritin: 30 ng/mL (ref 11–307)

## 2023-03-10 LAB — ANA W/REFLEX IF POSITIVE: Anti Nuclear Antibody (ANA): NEGATIVE

## 2023-03-10 NOTE — Assessment & Plan Note (Signed)
 History of iron deficiency with ongoing oral iron supplementation. -Iron levels are normal currently.  Continue oral iron supplementation. -We will consider IV iron infusion if levels are significantly low in the future.  Currently no indication for this

## 2023-03-10 NOTE — Telephone Encounter (Signed)
 Called Patient and spoke with daughter [name] and scheduled appts for three month f/u.

## 2023-03-10 NOTE — Assessment & Plan Note (Signed)
 Chronic low platelet count since 2011, with recent nosebleeds. No other bleeding symptoms. Not likely a bone marrow issue given stable white count and long-standing nature of thrombocytopenia. 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  -On labs today, there was evidence of platelet clumping.  We will obtain platelet count in citrated tube for further evaluation.  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  Discussed other possible etiologies including immune thrombocytopenia, nutrient deficiency including iron deficiency.  -B12, folate, TSH, iron studies, LDH, PT, PTT are all within normal limits today.  -Additional testing including ANA pending.  -Advise use of humidifier and saline nose drops to minimize risk of nosebleeds.  -Will follow-up on platelet results from citrated tube to confirm the exact platelet count.  No additional hematological intervention needed currently.  Plan to reevaluate in 3 months.

## 2023-03-11 LAB — METHYLMALONIC ACID, SERUM: Methylmalonic Acid, Quantitative: 224 nmol/L (ref 0–378)

## 2023-03-11 LAB — COPPER, SERUM: Copper: 116 ug/dL (ref 80–158)

## 2023-03-13 ENCOUNTER — Inpatient Hospital Stay: Payer: Medicare Other

## 2023-03-13 ENCOUNTER — Other Ambulatory Visit: Payer: Self-pay

## 2023-03-13 DIAGNOSIS — E119 Type 2 diabetes mellitus without complications: Secondary | ICD-10-CM | POA: Diagnosis not present

## 2023-03-13 DIAGNOSIS — Z79899 Other long term (current) drug therapy: Secondary | ICD-10-CM | POA: Diagnosis not present

## 2023-03-13 DIAGNOSIS — D696 Thrombocytopenia, unspecified: Secondary | ICD-10-CM | POA: Diagnosis not present

## 2023-03-13 DIAGNOSIS — Z87891 Personal history of nicotine dependence: Secondary | ICD-10-CM | POA: Diagnosis not present

## 2023-03-13 DIAGNOSIS — D500 Iron deficiency anemia secondary to blood loss (chronic): Secondary | ICD-10-CM | POA: Diagnosis not present

## 2023-03-13 DIAGNOSIS — I10 Essential (primary) hypertension: Secondary | ICD-10-CM | POA: Diagnosis not present

## 2023-03-13 LAB — CBC WITH DIFFERENTIAL (CANCER CENTER ONLY)
Abs Immature Granulocytes: 0.02 10*3/uL (ref 0.00–0.07)
Basophils Absolute: 0 10*3/uL (ref 0.0–0.1)
Basophils Relative: 1 %
Eosinophils Absolute: 0.1 10*3/uL (ref 0.0–0.5)
Eosinophils Relative: 1 %
HCT: 37.8 % (ref 36.0–46.0)
Hemoglobin: 12.4 g/dL (ref 12.0–15.0)
Immature Granulocytes: 0 %
Lymphocytes Relative: 20 %
Lymphs Abs: 1 10*3/uL (ref 0.7–4.0)
MCH: 24.6 pg — ABNORMAL LOW (ref 26.0–34.0)
MCHC: 32.8 g/dL (ref 30.0–36.0)
MCV: 75 fL — ABNORMAL LOW (ref 80.0–100.0)
Monocytes Absolute: 0.5 10*3/uL (ref 0.1–1.0)
Monocytes Relative: 10 %
Neutro Abs: 3.3 10*3/uL (ref 1.7–7.7)
Neutrophils Relative %: 68 %
Platelet Count: DECREASED 10*3/uL (ref 150–400)
RBC: 5.04 MIL/uL (ref 3.87–5.11)
RDW: 18 % — ABNORMAL HIGH (ref 11.5–15.5)
Smear Review: UNDETERMINED
WBC Count: 4.9 10*3/uL (ref 4.0–10.5)
nRBC: 0 % (ref 0.0–0.2)

## 2023-03-13 LAB — WBC/PLT IN CITRATE

## 2023-03-14 DIAGNOSIS — M25512 Pain in left shoulder: Secondary | ICD-10-CM | POA: Diagnosis not present

## 2023-05-17 ENCOUNTER — Encounter: Payer: Self-pay | Admitting: Cardiovascular Disease

## 2023-05-17 ENCOUNTER — Ambulatory Visit (INDEPENDENT_AMBULATORY_CARE_PROVIDER_SITE_OTHER): Payer: Medicare Other

## 2023-05-17 ENCOUNTER — Ambulatory Visit: Attending: Cardiovascular Disease | Admitting: Cardiovascular Disease

## 2023-05-17 VITALS — BP 128/64 | HR 80 | Ht 63.0 in | Wt 164.4 lb

## 2023-05-17 VITALS — BP 122/64 | HR 85 | Temp 98.9°F | Ht 63.0 in | Wt 165.6 lb

## 2023-05-17 DIAGNOSIS — Z0000 Encounter for general adult medical examination without abnormal findings: Secondary | ICD-10-CM | POA: Diagnosis not present

## 2023-05-17 DIAGNOSIS — Z720 Tobacco use: Secondary | ICD-10-CM

## 2023-05-17 DIAGNOSIS — E785 Hyperlipidemia, unspecified: Secondary | ICD-10-CM | POA: Diagnosis not present

## 2023-05-17 DIAGNOSIS — R0789 Other chest pain: Secondary | ICD-10-CM | POA: Diagnosis not present

## 2023-05-17 DIAGNOSIS — I10 Essential (primary) hypertension: Secondary | ICD-10-CM | POA: Diagnosis not present

## 2023-05-17 DIAGNOSIS — I739 Peripheral vascular disease, unspecified: Secondary | ICD-10-CM

## 2023-05-17 NOTE — Assessment & Plan Note (Signed)
 Discontinued

## 2023-05-17 NOTE — Patient Instructions (Signed)
 Ms. Giddings , Thank you for taking time to come for your Medicare Wellness Visit. I appreciate your ongoing commitment to your health goals. Please review the following plan we discussed and let me know if I can assist you in the future.   Referrals/Orders/Follow-Ups/Clinician Recommendations: none  This is a list of the screening recommended for you and due dates:  Health Maintenance  Topic Date Due   Zoster (Shingles) Vaccine (1 of 2) Never done   COVID-19 Vaccine (5 - 2024-25 season) 11/06/2022   Eye exam for diabetics  12/21/2022   Complete foot exam   04/06/2023   Flu Shot  06/05/2023*   Hemoglobin A1C  08/14/2023   Medicare Annual Wellness Visit  05/16/2024   DTaP/Tdap/Td vaccine (2 - Td or Tdap) 01/26/2031   Pneumonia Vaccine  Completed   DEXA scan (bone density measurement)  Completed   HPV Vaccine  Aged Out  *Topic was postponed. The date shown is not the original due date.    Advanced directives: (Declined) Advance directive discussed with you today. Even though you declined this today, please call our office should you change your mind, and we can give you the proper paperwork for you to fill out.  Next Medicare Annual Wellness Visit scheduled for next year: Yes  insert Preventive Care attachment Insert FALL PREVENTION attachment if needed

## 2023-05-17 NOTE — Assessment & Plan Note (Signed)
 History of PAD with abnormal ABIs and Doppler suspect just a high-frequency signal in the right common iliac artery and distal right SFA.  Her left ABI 0.84 with a high-frequency signal at the origin of the left SFA.  She is minimally ambulatory and denies claudication.

## 2023-05-17 NOTE — Assessment & Plan Note (Signed)
 History of atypical chest pain thought to be noncardiac but related to mastodynia.  This is being evaluated by her PCP, Dr. Allyne Gee.

## 2023-05-17 NOTE — Patient Instructions (Signed)

## 2023-05-17 NOTE — Assessment & Plan Note (Addendum)
 History of dyslipidemia on statin therapy with lipid profile performed 10/10/2022 revealing total cholesterol 182, LDL of 92 and HDL 79.

## 2023-05-17 NOTE — Progress Notes (Signed)
 Subjective:   BEZA STEPPE is a 89 y.o. who presents for a Medicare Wellness preventive visit.  Visit Complete: In person    AWV Questionnaire: No: Patient Medicare AWV questionnaire was not completed prior to this visit.  Cardiac Risk Factors include: advanced age (>37men, >67 women);diabetes mellitus;dyslipidemia;hypertension     Objective:    Today's Vitals   05/17/23 1518  BP: 122/64  Pulse: 85  Temp: 98.9 F (37.2 C)  TempSrc: Oral  SpO2: 95%  Weight: 165 lb 9.6 oz (75.1 kg)  Height: 5\' 3"  (1.6 m)   Body mass index is 29.33 kg/m.     05/17/2023    3:31 PM 02/10/2023    1:25 PM 10/19/2022    1:45 PM 08/11/2022    2:22 PM 06/17/2022    9:40 AM 03/09/2022    2:52 PM 01/25/2022   12:33 PM  Advanced Directives  Does Patient Have a Medical Advance Directive? No No No No No Yes No  Type of Careers adviser;Living will   Copy of Healthcare Power of Attorney in Chart?      No - copy requested   Would patient like information on creating a medical advance directive?   Yes (ED - Information included in AVS) No - Patient declined No - Patient declined  No - Patient declined    Current Medications (verified) Outpatient Encounter Medications as of 05/17/2023  Medication Sig   alendronate (FOSAMAX) 70 MG tablet TAKE 1 TABLET EVERY WEDNESDAY WITH A FULL GLASS OF WATER ON AN EMPTY STOMACH   ARTIFICIAL TEAR OP Place 1 drop into both eyes 2 (two) times daily as needed (dry eyes).   aspirin EC 81 MG tablet Take 81 mg by mouth daily.   Blood Glucose Monitoring Suppl (FREESTYLE FREEDOM LITE) w/Device KIT Use to check blood sugar 3 times a day. Dx code e11.65   budesonide (PULMICORT) 0.25 MG/2ML nebulizer solution Take 2 mLs (0.25 mg total) by nebulization in the morning and at bedtime.   FERROUS SULFATE PO Take 2 tablets by mouth daily. gummy   Fluocinolone Acetonide (DERMOTIC) 0.01 % OIL Place 3 drops in ear(s) in the morning and at bedtime.    formoterol (PERFOROMIST) 20 MCG/2ML nebulizer solution Take 2 mLs (20 mcg total) by nebulization 2 (two) times daily.   mupirocin ointment (BACTROBAN) 2 % Apply 1 Application topically 2 (two) times daily.   pravastatin (PRAVACHOL) 40 MG tablet Take 1 tablet (40 mg total) by mouth daily.   Spacer/Aero-Holding Chambers (AEROCHAMBER PLUS) inhaler Use as instructed   telmisartan (MICARDIS) 40 MG tablet TAKE 1 TABLET DAILY   triamcinolone cream (KENALOG) 0.1 % Apply 1 Application topically 2 (two) times daily.   albuterol (PROVENTIL) (2.5 MG/3ML) 0.083% nebulizer solution Take 3 mLs (2.5 mg total) by nebulization every 4 (four) hours as needed for wheezing or shortness of breath.   glucose blood (FREESTYLE LITE) test strip Use to check blood sugar 3 times a day. Dx code e11.65   No facility-administered encounter medications on file as of 05/17/2023.    Allergies (verified) Bactrim [sulfamethoxazole-trimethoprim] and Penicillins   History: Past Medical History:  Diagnosis Date   Arthritis    "shoulders" (05/04/2017)   Breast cancer, right breast (HCC) 1991   GERD (gastroesophageal reflux disease)    Hard of hearing    Hyperlipidemia    Hypertension    Memory loss    Non-stress test nonreactive, 04/24/12, normal 12/10/2012  OSA on CPAP    "don't wear it all the time" (05/04/2017)   PVD (peripheral vascular disease) (HCC)    Tobacco use 12/10/2012   Type II diabetes mellitus [facility])    Past Surgical History:  Procedure Laterality Date   APPENDECTOMY     BIOPSY  01/19/2018   Procedure: BIOPSY;  Surgeon: Jeani Hawking, MD;  Location: [facility];  Service: Endoscopy;;   BREAST BIOPSY Right 1991   CARDIOVASCULAR STRESS TEST  04/24/2012   No significant ST segment change suggestive of ischemia.   CATARACT EXTRACTION, BILATERAL Bilateral    COLONOSCOPY WITH PROPOFOL N/A 01/19/2018   Procedure: COLONOSCOPY WITH PROPOFOL;  Surgeon: Jeani Hawking, MD;  Location: [facility];  Service:  Endoscopy;  Laterality: N/A;   ESOPHAGOGASTRODUODENOSCOPY (EGD) WITH PROPOFOL N/A 01/19/2018   Procedure: ESOPHAGOGASTRODUODENOSCOPY (EGD) WITH PROPOFOL;  Surgeon: Jeani Hawking, MD;  Location: [facility];  Service: Endoscopy;  Laterality: N/A;   JOINT REPLACEMENT     LOWER EXTREMITY ARTERIAL DOPPLER Bilateral 05/07/2012   Left ABI-demonstrated mild arterial insufficiency. Right CIA 50-69% diameter reduction. Right SFA less than 50% diameter reduction. Left SFA 70-99% diameter reduction. Bilateral Runoff-Posterior tibial arteries appeared occluded.   MASTECTOMY PARTIAL / LUMPECTOMY W/ AXILLARY LYMPHADENECTOMY Right 1991    TOTAL HIP ARTHROPLASTY Right 2011   Family History  Problem Relation Age of Onset   Stroke Mother    Cancer Sister    Social History   Socioeconomic History   Marital status: Widowed    Spouse name: Not on file   Number of children: 8   Years of education: 8th grade   Highest education level: Not on file  Occupational History   Occupation: retired  Tobacco Use   Smoking status: Former    Current packs/day: 0.00    Average packs/day: 0.5 packs/day for 71.0 years (35.5 ttl pk-yrs)    Types: Cigarettes    Start date: 03/08/1947    Quit date: 2020    Years since quitting: 5.1   Smokeless tobacco: Never   Tobacco comments:    has tried cold [place]  Vaping Use   Vaping status: Never Used  Substance and Sexual Activity   Alcohol use: No    Alcohol/week: 0.0 standard drinks of alcohol   Drug use: No   Sexual activity: Not Currently    Partners: Male    Birth control/protection: Post-menopausal  Other Topics Concern   Not on file  Social History Narrative   Lives at home with her son.   She had eight children (two sets of twins).   No daily use of caffeine.   Right-handed.   Social Drivers of Corporate investment banker Strain: Low Risk  (05/17/2023)   Overall Financial Resource Strain (CARDIA)    Difficulty of Paying Living Expenses: Not hard at all   Food Insecurity: No Food Insecurity (05/17/2023)   Hunger Vital Sign    Worried About Running Out of Food in the Last Year: Never true    Ran Out of Food in the Last Year: Never true  Transportation Needs: No Transportation Needs (05/17/2023)   PRAPARE - Administrator, Civil Service (Medical): No    Lack of Transportation (Non-Medical): No  Physical Activity: Inactive (05/17/2023)   Exercise Vital Sign    Days of Exercise per Week: 0 days    Minutes of Exercise per Session: 0 min  Stress: No Stress Concern Present (05/17/2023)   [employer] of Occupational Health - Occupational Stress Questionnaire  Feeling of Stress : Not at all  Social Connections: Moderately Isolated (05/17/2023)   Social Connection and Isolation Panel [NHANES]    Frequency of Communication with Friends and Family: More than three times a week    Frequency of Social Gatherings with Friends and Family: More than three times a week    Attends Religious Services: Never    Database administrator or Organizations: Yes    Attends Banker Meetings: Never    Marital Status: Widowed    Tobacco Counseling Counseling given: Not Answered Tobacco comments: has tried cold [place]    Clinical Intake:  Pre-visit preparation completed: Yes  Pain : No/denies pain     Nutritional Risks: None Diabetes: Yes CBG done?: No Did pt. bring in CBG monitor from home?: No  How often do you need to have someone help you when you read instructions, pamphlets, or other written materials from your doctor or pharmacy?: 1 - Never  Interpreter Needed?: No  Information entered by :: NAllen LPN   Activities of Daily Living     05/17/2023    3:21 PM  In your present state of health, do you have any difficulty performing the following activities:  Hearing? 1  Comment broke hearing aids  Vision? 1  Difficulty concentrating or making decisions? 1  Walking or climbing stairs? 1  Dressing or bathing? 0   Doing errands, shopping? 1  Preparing Food and eating ? N  Using the Toilet? N  In the past six months, have you accidently leaked urine? Y  Do you have problems with loss of bowel control? N  Managing your Medications? N  Managing your Finances? N  Housekeeping or managing your Housekeeping? N    Patient Care Team: Dorothyann Peng, MD as PCP - General (Internal Medicine) Genia Del, MD as Consulting Physician (Obstetrics and Gynecology) Charlott Holler, MD as Consulting Physician (Pulmonary Disease)  Indicate any recent Medical Services you may have received from other than [facility] providers in the past year (date may be approximate).     Assessment:   This is a routine wellness examination for Sheenah.  Hearing/Vision screen Hearing Screening - Comments:: Has hearing aids that broke Vision Screening - Comments:: Regular eye exams   Goals Addressed             This Visit's Progress    Patient Stated       05/17/2023, walk more       Depression Screen     05/17/2023    3:34 PM 06/13/2022    3:46 PM 03/09/2022    2:53 PM 02/08/2022    2:49 PM 10/04/2021   11:43 AM 03/03/2021   10:38 AM 02/26/2020   10:22 AM  PHQ 2/9 Scores  PHQ - 2 Score 0 0 0 0 0 0 0  PHQ- 9 Score  0         Fall Risk     05/17/2023    3:32 PM 10/10/2022   11:04 AM 06/13/2022    3:46 PM 03/09/2022    2:52 PM 02/08/2022    2:49 PM  Fall Risk   Falls in the past year? 0 0 0 0 0  Number falls in past yr: 0 0 0 0 0  Injury with Fall? 0 0 0 0 0  Risk for fall due to : Impaired balance/gait No Fall Risks No Fall Risks Impaired mobility;Impaired balance/gait Impaired balance/gait;Impaired mobility  Follow up Falls prevention discussed;Falls evaluation completed Falls evaluation  completed Falls evaluation completed Falls prevention discussed;Education provided;Falls evaluation completed Falls evaluation completed    MEDICARE RISK AT HOME:  Medicare Risk at Home Any stairs in or around the home?:  Yes If so, are there any without handrails?: No Home free of loose throw rugs in walkways, pet beds, electrical cords, etc?: Yes Adequate lighting in your home to reduce risk of falls?: Yes Life alert?: No Use of a cane, walker or w/c?: No Grab bars in the bathroom?: Yes Shower chair or bench in shower?: Yes Elevated toilet seat or a handicapped toilet?: Yes  TIMED UP AND GO:  Was the test performed?  No  Cognitive Function: Impaired: Cognitive status assessed by direct observation. Patient has current diagnosis of cognitive impairment.    12/23/2019    3:34 PM 06/24/2019    8:33 AM  MMSE - Mini Mental State Exam  Orientation to time 4 5  Orientation to Place 5 5  Registration 3 3  Attention/ Calculation 0 2  Recall 2 0  Language- name 2 objects 2 2  Language- repeat 0 1  Language- follow 3 step command 3 3  Language- read & follow direction 1 1  Write a sentence 1 1  Copy design 0 0  Total score 21 23        03/09/2022    2:56 PM 03/03/2021   10:42 AM 02/26/2020   10:27 AM 02/14/2019    8:51 AM 02/07/2018    2:31 PM  6CIT Screen  What Year? 0 points 0 points 0 points 0 points 0 points  What month? 0 points 0 points 0 points 0 points 0 points  What time? 0 points 0 points 3 points 3 points 0 points  Count back from 20 2 points 0 points 0 points 0 points 0 points  Months in reverse 4 points 4 points 2 points 4 points 4 points  Repeat phrase 10 points 8 points 4 points 10 points 2 points  Total Score 16 points 12 points 9 points 17 points 6 points    Immunizations Immunization History  Administered Date(s) Administered   Influenza Whole 02/16/2010   PFIZER(Purple Top)SARS-COV-2 Vaccination 04/29/2019, 05/20/2019, 01/09/2020, 09/22/2020   PNEUMOCOCCAL CONJUGATE-20 01/25/2021   Pneumococcal-Unspecified 06/19/2013   Tdap 01/25/2021    Screening Tests Health Maintenance  Topic Date Due   Zoster Vaccines- Shingrix (1 of 2) Never done   COVID-19 Vaccine (5 -  2024-25 season) 11/06/2022   OPHTHALMOLOGY EXAM  12/21/2022   FOOT EXAM  04/06/2023   INFLUENZA VACCINE  06/05/2023 (Originally 10/06/2022)   HEMOGLOBIN A1C  08/14/2023   Medicare Annual Wellness (AWV)  05/16/2024   DTaP/Tdap/Td (2 - Td or Tdap) 01/26/2031   Pneumonia Vaccine 33+ Years old  Completed   DEXA SCAN  Completed   HPV VACCINES  Aged Out    Health Maintenance  Health Maintenance Due  Topic Date Due   Zoster Vaccines- Shingrix (1 of 2) Never done   COVID-19 Vaccine (5 - 2024-25 season) 11/06/2022   OPHTHALMOLOGY EXAM  12/21/2022   FOOT EXAM  04/06/2023   Health Maintenance Items Addressed: Declines vaccines. Needs to find a new eye doctor.  Additional Screening:  Vision Screening: Recommended annual ophthalmology exams for early detection of glaucoma and other disorders of the eye.  Dental Screening: Recommended annual dental exams for proper oral hygiene  Community Resource Referral / Chronic Care Management: CRR required this visit?  No   CCM required this visit?  No  Plan:     I have personally reviewed and noted the following in the patient's chart:   Medical and social history Use of alcohol, tobacco or illicit drugs  Current medications and supplements including opioid prescriptions. Patient is not currently taking opioid prescriptions. Functional ability and status Nutritional status Physical activity Advanced directives List of other physicians Hospitalizations, surgeries, and ER visits in previous 12 months Vitals Screenings to include cognitive, depression, and falls Referrals and appointments  In addition, I have reviewed and discussed with patient certain preventive protocols, quality metrics, and best practice recommendations. A written personalized care plan for preventive services as well as general preventive health recommendations were provided to patient.     Barb Merino, LPN   1/61/0960   After Visit Summary: (In  Person-Printed) AVS printed and given to the patient  Notes: Nothing significant to report at this time.

## 2023-05-17 NOTE — Assessment & Plan Note (Signed)
 History of essential hypertension her blood pressure measured today at 128/64.  She is on Micardis.

## 2023-05-17 NOTE — Progress Notes (Signed)
 05/17/2023 Leah Wiggins   1927/02/19  [id number]  Primary Physician Dorothyann Peng, MD Primary Cardiologist: Runell Gess MD FACP, [place], [place], [place]  HPI:  Leah Wiggins is a 89 y.o.   mildly overweight widowed [place] American female, mother of 8, grandmother of greater than 50 grandchildren. She is accompanied by her daughter [name] today. I last saw her in the office 07/14/2021.Marland Kitchen Her risk factors include recently discontinued tobacco abuse having smoked for many years and currently wearing a nicotine patch., contrary, hypertension, mild hyperlipidemia, and non-insulin-requiring diabetes. She also has peripheral vascular occlusive disease with Dopplers that show a right ABI of .64 with a high-frequency signal in the origin of the right common iliac artery I saw this progression of disease in the distal right SFA., left ABI of 0.84 with a high-frequency signal in the origin of the left SFA, though she really denies claudication. She had a negative Myoview performed 04/24/12 and more recently 04/29/14 for atypical chest pain.   Since I saw her in the office almost 2 years ago she has remained stable.  She lives with her son.  She was seen in the ER in December with atypical chest pain thought to be mastodynia.  Cardiac workup was unrevealing.  She is minimally ambulatory.   Current Meds  Medication Sig   alendronate (FOSAMAX) 70 MG tablet TAKE 1 TABLET EVERY WEDNESDAY WITH A FULL GLASS OF WATER ON AN EMPTY STOMACH   ARTIFICIAL TEAR OP Place 1 drop into both eyes 2 (two) times daily as needed (dry eyes).   aspirin EC 81 MG tablet Take 81 mg by mouth daily.   Blood Glucose Monitoring Suppl (FREESTYLE FREEDOM LITE) w/Device KIT Use to check blood sugar 3 times a day. Dx code e11.65   budesonide (PULMICORT) 0.25 MG/2ML nebulizer solution Take 2 mLs (0.25 mg total) by nebulization in the morning and at bedtime.   FERROUS SULFATE PO Take 2 tablets by mouth daily. gummy    Fluocinolone Acetonide (DERMOTIC) 0.01 % OIL Place 3 drops in ear(s) in the morning and at bedtime.   formoterol (PERFOROMIST) 20 MCG/2ML nebulizer solution Take 2 mLs (20 mcg total) by nebulization 2 (two) times daily.   mupirocin ointment (BACTROBAN) 2 % Apply 1 Application topically 2 (two) times daily.   pravastatin (PRAVACHOL) 40 MG tablet Take 1 tablet (40 mg total) by mouth daily.   Spacer/Aero-Holding Chambers (AEROCHAMBER PLUS) inhaler Use as instructed   telmisartan (MICARDIS) 40 MG tablet TAKE 1 TABLET DAILY   triamcinolone cream (KENALOG) 0.1 % Apply 1 Application topically 2 (two) times daily.     Allergies  Allergen Reactions   Bactrim [Sulfamethoxazole-Trimethoprim] Swelling    Marked angioedema requiring hospitalization   Penicillins Other (See Comments)    Has patient had a PCN reaction causing immediate rash, facial/tongue/throat swelling, SOB or lightheadedness with hypotension: Unk Has patient had a PCN reaction causing severe rash involving mucus membranes or skin necrosis: Unk Has patient had a PCN reaction that required hospitalization: Unk Has patient had a PCN reaction occurring within the last 10 years: No If all of the above answers are "NO", then may proceed with Cephalosporin use.    Social History   Socioeconomic History   Marital status: Widowed    Spouse name: Not on file   Number of children: 8   Years of education: 8th grade   Highest education level: Not on file  Occupational History   Occupation: retired  Tobacco Use  Smoking status: Former    Current packs/day: 0.00    Average packs/day: 0.5 packs/day for 71.0 years (35.5 ttl pk-yrs)    Types: Cigarettes    Start date: 03/08/1947    Quit date: 2020    Years since quitting: 5.1   Smokeless tobacco: Never   Tobacco comments:    has tried cold [place]  Vaping Use   Vaping status: Never Used  Substance and Sexual Activity   Alcohol use: No    Alcohol/week: 0.0 standard drinks of alcohol    Drug use: No   Sexual activity: Not Currently    Partners: Male    Birth control/protection: Post-menopausal  Other Topics Concern   Not on file  Social History Narrative   Lives at home with her son.   She had eight children (two sets of twins).   No daily use of caffeine.   Right-handed.   Social Drivers of Corporate investment banker Strain: Low Risk  (03/09/2022)   Overall Financial Resource Strain (CARDIA)    Difficulty of Paying Living Expenses: Not hard at all  Food Insecurity: No Food Insecurity (10/19/2022)   Hunger Vital Sign    Worried About Running Out of Food in the Last Year: Never true    Ran Out of Food in the Last Year: Never true  Transportation Needs: No Transportation Needs (10/19/2022)   PRAPARE - Administrator, Civil Service (Medical): No    Lack of Transportation (Non-Medical): No  Physical Activity: Inactive (03/09/2022)   Exercise Vital Sign    Days of Exercise per Week: 0 days    Minutes of Exercise per Session: 0 min  Stress: No Stress Concern Present (03/09/2022)   [employer] of Occupational Health - Occupational Stress Questionnaire    Feeling of Stress : Not at all  Social Connections: Not on file  Intimate Partner Violence: Not At Risk (10/19/2022)   Humiliation, Afraid, Rape, and Kick questionnaire    Fear of Current or Ex-Partner: No    Emotionally Abused: No    Physically Abused: No    Sexually Abused: No     Review of Systems: General: negative for chills, fever, night sweats or weight changes.  Cardiovascular: negative for chest pain, dyspnea on exertion, edema, orthopnea, palpitations, paroxysmal nocturnal dyspnea or shortness of breath Dermatological: negative for rash Respiratory: negative for cough or wheezing Urologic: negative for hematuria Abdominal: negative for nausea, vomiting, diarrhea, bright red blood per rectum, melena, or hematemesis Neurologic: negative for visual changes, syncope, or dizziness All other  systems reviewed and are otherwise negative except as noted above.    Blood pressure 128/64, pulse 80, height 5\' 3"  (1.6 m), weight 164 lb 6.4 oz (74.6 kg), SpO2 93%.  General appearance: alert and no distress Neck: no adenopathy, no carotid bruit, no JVD, supple, symmetrical, trachea midline, and thyroid not enlarged, symmetric, no tenderness/mass/nodules Lungs: clear to auscultation bilaterally Heart: regular rate and rhythm, S1, S2 normal, no murmur, click, rub or gallop Extremities: extremities normal, atraumatic, no cyanosis or edema Pulses: 2+ and symmetric Skin: Skin color, texture, turgor normal. No rashes or lesions Neurologic: Grossly normal  EKG not performed today      ASSESSMENT AND PLAN:   PAD (peripheral artery disease), abnormal ABIs History of PAD with abnormal ABIs and Doppler suspect just a high-frequency signal in the right common iliac artery and distal right SFA.  Her left ABI 0.84 with a high-frequency signal at the origin of the left SFA.  She is minimally ambulatory and denies claudication.  Dyslipidemia History of dyslipidemia on statin therapy with lipid profile performed 10/10/2022 revealing total cholesterol 182, LDL of 92 and HDL 79.  Tobacco use Discontinued  Essential hypertension History of essential hypertension her blood pressure measured today at 128/64.  She is on Micardis.  Chest pain History of atypical chest pain thought to be noncardiac but related to mastodynia.  This is being evaluated by her PCP, Dr. Allyne Gee.     Runell Gess MD FACP,FACC,FAHA, [facility] 05/17/2023 1:52 PM

## 2023-05-31 ENCOUNTER — Other Ambulatory Visit: Payer: Self-pay | Admitting: Internal Medicine

## 2023-06-07 ENCOUNTER — Other Ambulatory Visit: Payer: Self-pay

## 2023-06-07 ENCOUNTER — Inpatient Hospital Stay: Payer: Medicare Other | Attending: Oncology

## 2023-06-07 ENCOUNTER — Inpatient Hospital Stay (HOSPITAL_BASED_OUTPATIENT_CLINIC_OR_DEPARTMENT_OTHER): Payer: Medicare Other | Admitting: Oncology

## 2023-06-07 ENCOUNTER — Encounter: Payer: Self-pay | Admitting: Oncology

## 2023-06-07 ENCOUNTER — Other Ambulatory Visit: Payer: Self-pay | Admitting: Oncology

## 2023-06-07 VITALS — BP 132/48 | HR 64 | Temp 97.9°F | Resp 16 | Ht 63.0 in | Wt 167.5 lb

## 2023-06-07 DIAGNOSIS — D696 Thrombocytopenia, unspecified: Secondary | ICD-10-CM

## 2023-06-07 DIAGNOSIS — D500 Iron deficiency anemia secondary to blood loss (chronic): Secondary | ICD-10-CM | POA: Diagnosis not present

## 2023-06-07 LAB — CMP (CANCER CENTER ONLY)
ALT: 16 U/L (ref 0–44)
AST: 17 U/L (ref 15–41)
Albumin: 4 g/dL (ref 3.5–5.0)
Alkaline Phosphatase: 75 U/L (ref 38–126)
Anion gap: 4 — ABNORMAL LOW (ref 5–15)
BUN: 21 mg/dL (ref 8–23)
CO2: 31 mmol/L (ref 22–32)
Calcium: 9.5 mg/dL (ref 8.9–10.3)
Chloride: 107 mmol/L (ref 98–111)
Creatinine: 1.01 mg/dL — ABNORMAL HIGH (ref 0.44–1.00)
GFR, Estimated: 52 mL/min — ABNORMAL LOW (ref >60)
Glucose, Bld: 111 mg/dL — ABNORMAL HIGH (ref 70–99)
Potassium: 4.2 mmol/L (ref 3.5–5.1)
Sodium: 142 mmol/L (ref 135–145)
Total Bilirubin: 0.4 mg/dL (ref 0.0–1.2)
Total Protein: 7.1 g/dL (ref 6.5–8.1)

## 2023-06-07 LAB — CBC WITH DIFFERENTIAL (CANCER CENTER ONLY)
Abs Immature Granulocytes: 0.03 10*3/uL (ref 0.00–0.07)
Basophils Absolute: 0 10*3/uL (ref 0.0–0.1)
Basophils Relative: 0 %
Eosinophils Absolute: 0.1 10*3/uL (ref 0.0–0.5)
Eosinophils Relative: 1 %
HCT: 38.5 % (ref 36.0–46.0)
Hemoglobin: 12.2 g/dL (ref 12.0–15.0)
Immature Granulocytes: 1 %
Lymphocytes Relative: 16 %
Lymphs Abs: 0.8 10*3/uL (ref 0.7–4.0)
MCH: 24.4 pg — ABNORMAL LOW (ref 26.0–34.0)
MCHC: 31.7 g/dL (ref 30.0–36.0)
MCV: 77 fL — ABNORMAL LOW (ref 80.0–100.0)
Monocytes Absolute: 0.5 10*3/uL (ref 0.1–1.0)
Monocytes Relative: 9 %
Neutro Abs: 3.9 10*3/uL (ref 1.7–7.7)
Neutrophils Relative %: 73 %
Platelet Count: UNDETERMINED 10*3/uL (ref 150–400)
RBC: 5 MIL/uL (ref 3.87–5.11)
RDW: 18.2 % — ABNORMAL HIGH (ref 11.5–15.5)
WBC Count: 5.3 10*3/uL (ref 4.0–10.5)
nRBC: 0 % (ref 0.0–0.2)

## 2023-06-07 LAB — FOLATE: Folate: 25.8 ng/mL (ref >5.9)

## 2023-06-07 LAB — IRON AND IRON BINDING CAPACITY (CC-WL,HP ONLY)
Iron: 74 ug/dL (ref 28–170)
Saturation Ratios: 25 % (ref 10.4–31.8)
TIBC: 298 ug/dL (ref 250–450)
UIBC: 224 ug/dL (ref 148–442)

## 2023-06-07 LAB — VITAMIN B12: Vitamin B-12: 599 pg/mL (ref 180–914)

## 2023-06-07 LAB — FERRITIN: Ferritin: 41 ng/mL (ref 11–307)

## 2023-06-07 NOTE — Assessment & Plan Note (Signed)
 History of iron deficiency with ongoing oral iron supplementation. -Iron levels are normal currently.  Continue oral iron supplementation. -We will consider IV iron infusion if levels are significantly low in the future.  Currently no indication for this

## 2023-06-07 NOTE — Progress Notes (Signed)
 [facility]  HEMATOLOGY CLINIC PROGRESS NOTE  PATIENT NAME: Leah Wiggins   MR#: [id number] DOB: Jul 29, 1926  Patient Care Team: Dorothyann Peng, MD as PCP - General (Internal Medicine) Genia Del, MD as Consulting Physician (Obstetrics and Gynecology) Charlott Holler, MD as Consulting Physician (Pulmonary Disease)  Date of visit: 06/07/2023   ASSESSMENT & PLAN:   Leah Wiggins is a 89 y.o. lady with a past medical history of hypertension, dyslipidemia, OSA, GERD, type 2 diabetes mellitus, remote history of right breast cancer in 1991, left upper lobe lung nodule concerning for malignancy (not biopsy-proven), status post SBRT in April to May 2024, was referred to our service for evaluation of chronic thrombocytopenia.  There could be a component of pseudothrombocytopenia because of platelet clumping.  Thrombocytopenia (HCC) Chronic low platelet count since 2011, with recent nosebleeds. No other bleeding symptoms. Not likely a bone marrow issue given stable white count and long-standing nature of thrombocytopenia. 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  On her initial consultation with [place] on 03/13/2023, there was evidence of platelet clumping on CBCD.  In the future, we will obtain platelet count in citrated tube for further evaluation.   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  Discussed other possible etiologies including immune thrombocytopenia, nutrient deficiency including iron deficiency.   B12, folate, TSH, iron studies, LDH, PT, PTT were all within normal limits on 03/13/2023.  ANA negative.    -Advise use of humidifier and saline nose drops to minimize risk of nosebleeds.  Labs today revealed stable hemoglobin of 12.2.  White count normal with normal differential.  Platelet clumping noted again.  Citrated tube pending.   -Will follow-up on platelet results from  citrated tube to confirm the exact platelet count.  No additional hematological intervention needed currently.     She was advised to continue oral iron supplements once daily.  Iron deficiency anemia due to chronic blood loss History of iron deficiency with ongoing oral iron supplementation. -Iron levels are normal currently.  Continue oral iron supplementation. -We will consider IV iron infusion if levels are significantly low in the future.  Currently no indication for this.   I spent a total of 20 minutes during this encounter with the patient including review of chart and various tests results, discussions about plan of care and coordination of care plan.  I reviewed lab results and outside records for this visit and discussed relevant results with the patient. Diagnosis, plan of care and treatment options were also discussed in detail with the patient. Opportunity provided to ask questions and answers provided to her apparent satisfaction. Provided instructions to call our clinic with any problems, questions or concerns prior to return visit. I recommended to continue follow-up with PCP and sub-specialists. She verbalized understanding and agreed with the plan. No barriers to learning was detected.  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   Patient returns for follow-up.  She is accompanied by her daughter, [name].  She experiences occasional  episodes of epistaxis, with the most recent episode occurring last  week. The episode lasted approximately two minutes and resolved spontaneously. These episodes are infrequent, occurring less than once a week. No other bleeding issues such as hematochezia or melena.  She is currently taking iron supplements once daily by mouth, with regular adherence confirmed by her daughter. She also takes a liquid vitamin supplement daily.   She is currently residing at her sister's house due to a tree falling on her own home in early March. Her daughter's insurance is managing the situation, and she is adjusting well to the current living arrangement.  SUMMARY OF HEMATOLOGIC HISTORY:  On review of records, patient has had chronic thrombocytopenia at least since 2011 with platelet count ranging anywhere between 48,000 (November 2024)-200,000.  Report of moderate thrombocytopenia with aggregation and potential platelet clumping on multiple occasions.  She was evaluated by at least 3 hematologists in the past, most recently by Dr. Caron Presume in 2019.  Workup was indicative of iron deficiency state and she has been on iron supplementation chronically.  There was also a possibility of immune thrombocytopenia.   Recently her platelet count has been lower around 48,000 in November 2024 and 75,000 on 02/10/2023 and hence a referral was sent to [place] again for further evaluation of thrombocytopenia.   The patient has been experiencing nosebleeds, which occurred twice on the same day. The patient denies any other bleeding problems. The patient also has iron deficiency and is currently taking iron supplements. The patient's daughter mentioned that she gives the patient a liquid multivitamin and makes her fresh juices, including beet juice.  Chronic low platelet count since 2011, with recent nosebleeds. No other bleeding symptoms. Not likely a bone marrow issue given stable white count and long-standing nature of thrombocytopenia. 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   On her initial consultation with [place] on 03/13/2023, there was evidence of platelet clumping on CBCD.  In the future, we will obtain platelet count in citrated tube for further evaluation.   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  Discussed other possible etiologies including immune thrombocytopenia, nutrient deficiency including iron deficiency.   B12, folate, TSH, iron studies, LDH, PT, PTT were all within normal limits on 03/13/2023.  ANA negative.    -Advise use of humidifier and saline nose drops to minimize risk of nosebleeds.   -Will follow-up on platelet results from citrated tube to confirm the exact platelet count.  No additional hematological intervention needed currently.  Plan to reevaluate in 3 months.   She was advised to continue oral iron supplements once daily.  I have reviewed the past medical history, past surgical history, social history and family history with the patient and they are unchanged from previous note.  ALLERGIES: She is allergic to bactrim [sulfamethoxazole-trimethoprim] and penicillins.  MEDICATIONS:  Current Outpatient Medications  Medication Sig Dispense Refill   alendronate (FOSAMAX) 70 MG tablet TAKE 1 TABLET EVERY WEDNESDAY WITH A FULL GLASS OF WATER ON AN EMPTY STOMACH 12 tablet 3   ARTIFICIAL TEAR OP Place 1 drop into both eyes 2 (two) times daily as needed (dry eyes).     aspirin EC 81 MG tablet Take 81 mg by mouth daily.     Blood Glucose Monitoring Suppl (FREESTYLE FREEDOM LITE) w/Device KIT Use to check blood sugar 3 times a day. Dx code e11.65 1 kit 3   FERROUS SULFATE PO Take 2 tablets by mouth daily.  gummy     telmisartan (MICARDIS) 40 MG tablet TAKE 1 TABLET DAILY 90 tablet 3   albuterol (PROVENTIL) (2.5 MG/3ML) 0.083% nebulizer solution Take 3 mLs (2.5 mg total) by nebulization every 4 (four) hours as needed for wheezing or shortness of breath. 75 mL 2   budesonide (PULMICORT) 0.25 MG/2ML  nebulizer solution Take 2 mLs (0.25 mg total) by nebulization in the morning and at bedtime. (Patient not taking: Reported on 06/07/2023) 60 mL 11   Fluocinolone Acetonide (DERMOTIC) 0.01 % OIL Place 3 drops in ear(s) in the morning and at bedtime. (Patient not taking: Reported on 06/07/2023) 20 mL 0   formoterol (PERFOROMIST) 20 MCG/2ML nebulizer solution Take 2 mLs (20 mcg total) by nebulization 2 (two) times daily. (Patient not taking: Reported on 06/07/2023) 60 mL 11   glucose blood (FREESTYLE LITE) test strip Use to check blood sugar 3 times a day. Dx code e11.65 150 each 3   mupirocin ointment (BACTROBAN) 2 % Apply 1 Application topically 2 (two) times daily. 22 g 0   pravastatin (PRAVACHOL) 40 MG tablet Take 1 tablet (40 mg total) by mouth daily. (Patient not taking: Reported on 06/07/2023) 90 tablet 3   Spacer/Aero-Holding Chambers (AEROCHAMBER PLUS) inhaler Use as instructed (Patient not taking: Reported on 06/07/2023) 1 each 2   triamcinolone cream (KENALOG) 0.1 % Apply 1 Application topically 2 (two) times daily. (Patient not taking: Reported on 06/07/2023) 30 g 0   No current facility-administered medications for this visit.     REVIEW OF SYSTEMS:    Review of Systems - Oncology  All other pertinent systems were reviewed with the patient and are negative.  PHYSICAL EXAMINATION:   Onc Performance Status - 06/07/23 1142       ECOG Perf Status   ECOG Perf Status Capable of only limited selfcare, confined to bed or chair more than 50% of waking hours      KPS SCALE   KPS % SCORE Requires occasional assistance but is able to care for most needs             Vitals:   06/07/23 1131 06/07/23 1134  BP: (!) 149/52 (!) 132/48  Pulse: 64   Resp: 16   Temp: 97.9 F (36.6 C)   SpO2: 95%    Filed Weights   06/07/23 1131  Weight: 167 lb 8 oz (76 kg)    Physical Exam Constitutional:      General: She is not in acute distress.    Appearance: Normal appearance.  HENT:     Head:  Normocephalic and atraumatic.  Eyes:     General: No scleral icterus.    Conjunctiva/sclera: Conjunctivae normal.  Cardiovascular:     Rate and Rhythm: Normal rate and regular rhythm.  Pulmonary:     Effort: Pulmonary effort is normal.  Abdominal:     General: There is no distension.  Neurological:     General: No focal deficit present.     Mental Status: She is alert.  Psychiatric:        Mood and Affect: Mood normal.        Behavior: Behavior normal.     LABORATORY DATA:   I have reviewed the data as listed.  Results for orders placed or performed in visit on 06/07/23  CMP (Cancer Center only)  Result Value Ref Range   Sodium 142 135 - 145 mmol/L   Potassium 4.2 3.5 - 5.1 mmol/L   Chloride 107 98 - 111 mmol/L  CO2 31 22 - 32 mmol/L   Glucose, Bld 111 (H) 70 - 99 mg/dL   BUN 21 8 - 23 mg/dL   Creatinine 1.61 (H) 0.96 - 1.00 mg/dL   Calcium 9.5 8.9 - 04.5 mg/dL   Total Protein 7.1 6.5 - 8.1 g/dL   Albumin 4.0 3.5 - 5.0 g/dL   AST 17 15 - 41 U/L   ALT 16 0 - 44 U/L   Alkaline Phosphatase 75 38 - 126 U/L   Total Bilirubin 0.4 0.0 - 1.2 mg/dL   GFR, Estimated 52 (L) >60 mL/min   Anion gap 4 (L) 5 - 15  Iron and Iron Binding Capacity ([facility] only)  Result Value Ref Range   Iron 74 28 - 170 ug/dL   TIBC 409 811 - 914 ug/dL   Saturation Ratios 25 10.4 - 31.8 %   UIBC 224 148 - 442 ug/dL  CBC with Differential (Cancer Center Only)  Result Value Ref Range   WBC Count 5.3 4.0 - 10.5 K/uL   RBC 5.00 3.87 - 5.11 MIL/uL   Hemoglobin 12.2 12.0 - 15.0 g/dL   HCT 78.2 95.6 - 21.3 %   MCV 77.0 (L) 80.0 - 100.0 fL   MCH 24.4 (L) 26.0 - 34.0 pg   MCHC 31.7 30.0 - 36.0 g/dL   RDW 08.6 (H) 57.8 - 46.9 %   Platelet Count PLATELET CLUMPS NOTED ON SMEAR, UNABLE TO ESTIMATE 150 - 400 K/uL   nRBC 0.0 0.0 - 0.2 %   Neutrophils Relative % 73 %   Neutro Abs 3.9 1.7 - 7.7 K/uL   Lymphocytes Relative 16 %   Lymphs Abs 0.8 0.7 - 4.0 K/uL   Monocytes Relative 9 %   Monocytes  Absolute 0.5 0.1 - 1.0 K/uL   Eosinophils Relative 1 %   Eosinophils Absolute 0.1 0.0 - 0.5 K/uL   Basophils Relative 0 %   Basophils Absolute 0.0 0.0 - 0.1 K/uL   WBC Morphology MORPHOLOGY UNREMARKABLE    RBC Morphology MORPHOLOGY UNREMARKABLE    Smear Review      PLATELET CLUMPING, SUGGEST RECOLLECTION OF SAMPLE IN CITRATE TUBE.   Immature Granulocytes 1 %   Abs Immature Granulocytes 0.03 0.00 - 0.07 K/uL     RADIOGRAPHIC STUDIES:  No recent pertinent imaging studies available to review.  Orders Placed This Encounter  Procedures   CBC with Differential (Cancer Center Only)    Standing Status:   Future    Expected Date:   12/07/2023    Expiration Date:   06/06/2024   CMP (Cancer Center only)    Standing Status:   Future    Expected Date:   12/07/2023    Expiration Date:   06/06/2024   Ferritin    Standing Status:   Future    Expected Date:   12/07/2023    Expiration Date:   06/06/2024   Iron and Iron Binding Capacity ([facility] only)    Standing Status:   Future    Expected Date:   12/07/2023    Expiration Date:   06/06/2024   Vitamin B12    Standing Status:   Future    Expected Date:   12/07/2023    Expiration Date:   06/06/2024   Folate    Standing Status:   Future    Expected Date:   12/07/2023    Expiration Date:   06/06/2024   WBC/PLT in Citrate    Standing Status:   Future    Expected  Date:   12/07/2023    Expiration Date:   06/06/2024     Future Appointments  Date Time Provider Department Center  06/08/2023  2:40 PM Dorothyann Peng, MD [facility] None  06/13/2023  1:00 PM Charlott Holler, MD [facility] None  10/12/2023 11:00 AM Dorothyann Peng, MD [facility] None  10/19/2023 11:00 AM Antony Blackbird, MD [facility] None  12/07/2023 10:00 AM [facility] None  12/07/2023 10:30 AM Breslin Burklow, Archie Patten, MD [facility] None  06/12/2024  3:20 PM [facility]ANNUAL WELLNESS VISIT [facility] None     This document was completed utilizing speech recognition software. Grammatical errors,  random word insertions, pronoun errors, and incomplete sentences are an occasional consequence of this system due to software limitations, ambient noise, and hardware issues. Any formal questions or concerns about the content, text or information contained within the body of this dictation should be directly addressed to the provider for clarification.

## 2023-06-07 NOTE — Assessment & Plan Note (Signed)
 Chronic low platelet count since 2011, with recent nosebleeds. No other bleeding symptoms. Not likely a bone marrow issue given stable white count and long-standing nature of thrombocytopenia. 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  On her initial consultation with [place] on 03/13/2023, there was evidence of platelet clumping on CBCD.  In the future, we will obtain platelet count in citrated tube for further evaluation.   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  Discussed other possible etiologies including immune thrombocytopenia, nutrient deficiency including iron deficiency.   B12, folate, TSH, iron studies, LDH, PT, PTT were all within normal limits on 03/13/2023.  ANA negative.    -Advise use of humidifier and saline nose drops to minimize risk of nosebleeds.  Labs today revealed stable hemoglobin of 12.2.  White count normal with normal differential.  Platelet clumping noted again.  Citrated tube pending.   -Will follow-up on platelet results from citrated tube to confirm the exact platelet count.  No additional hematological intervention needed currently.     She was advised to continue oral iron supplements once daily.

## 2023-06-08 ENCOUNTER — Ambulatory Visit (INDEPENDENT_AMBULATORY_CARE_PROVIDER_SITE_OTHER): Admitting: Internal Medicine

## 2023-06-08 ENCOUNTER — Other Ambulatory Visit: Payer: Self-pay

## 2023-06-08 ENCOUNTER — Encounter: Payer: Self-pay | Admitting: Internal Medicine

## 2023-06-08 VITALS — BP 124/72 | HR 76 | Temp 98.6°F | Ht 63.0 in | Wt 169.6 lb

## 2023-06-08 DIAGNOSIS — R519 Headache, unspecified: Secondary | ICD-10-CM

## 2023-06-08 DIAGNOSIS — I700 Atherosclerosis of aorta: Secondary | ICD-10-CM | POA: Diagnosis not present

## 2023-06-08 DIAGNOSIS — Z6830 Body mass index (BMI) 30.0-30.9, adult: Secondary | ICD-10-CM

## 2023-06-08 DIAGNOSIS — E66811 Obesity, class 1: Secondary | ICD-10-CM

## 2023-06-08 DIAGNOSIS — E1122 Type 2 diabetes mellitus with diabetic chronic kidney disease: Secondary | ICD-10-CM | POA: Diagnosis not present

## 2023-06-08 DIAGNOSIS — J439 Emphysema, unspecified: Secondary | ICD-10-CM

## 2023-06-08 DIAGNOSIS — I130 Hypertensive heart and chronic kidney disease with heart failure and stage 1 through stage 4 chronic kidney disease, or unspecified chronic kidney disease: Secondary | ICD-10-CM | POA: Diagnosis not present

## 2023-06-08 DIAGNOSIS — H90A32 Mixed conductive and sensorineural hearing loss, unilateral, left ear with restricted hearing on the contralateral side: Secondary | ICD-10-CM | POA: Diagnosis not present

## 2023-06-08 DIAGNOSIS — J4489 Other specified chronic obstructive pulmonary disease: Secondary | ICD-10-CM

## 2023-06-08 DIAGNOSIS — C3412 Malignant neoplasm of upper lobe, left bronchus or lung: Secondary | ICD-10-CM | POA: Diagnosis not present

## 2023-06-08 DIAGNOSIS — N1831 Chronic kidney disease, stage 3a: Secondary | ICD-10-CM | POA: Diagnosis not present

## 2023-06-08 DIAGNOSIS — E6609 Other obesity due to excess calories: Secondary | ICD-10-CM

## 2023-06-08 MED ORDER — ALENDRONATE SODIUM 70 MG PO TABS: ORAL_TABLET | ORAL | 3 refills | Status: DC

## 2023-06-08 NOTE — Patient Instructions (Signed)

## 2023-06-08 NOTE — Assessment & Plan Note (Addendum)
 She is s/p SBRT May 2024.  CT of the chest on 10/13/22 showed interval decrease in the size of the LUL pulmonary lesion with post treatment scarring and interval decrease in size of the subcarinal lymph node. She is scheduled to have yearly scans.

## 2023-06-08 NOTE — Assessment & Plan Note (Signed)
 Chronic, controlled.  She will continue with telmisartan 40mg  daily. She is reminded to follow a low sodium diet. She will f/u in four to six months for re-evaluation.

## 2023-06-08 NOTE — Assessment & Plan Note (Signed)
 Her BMI is acceptable for her demographic. She is encouraged to incorporate more exercise into her daily routine. Encouraged to perform heel raises while watching TV.

## 2023-06-08 NOTE — Assessment & Plan Note (Signed)
COPD, sx are stable. She denies having any worsening sob. Pulmonary input appreciated, she has had PFTs. She use nebulizer for symptoms, no new issues. No recent exacerbations.

## 2023-06-08 NOTE — Assessment & Plan Note (Addendum)
 She appears to have some scalp tenderness today. Will check labs as below.  Intermittent head and facial pain, possibly tension-type headache or scalp muscle tension. - Recommend acetaminophen for head pain. - Advise temple massage with Vicks VapoRub for muscle tension relief. ??? Temporal arteritis -she denies having painful chewing. Also, sx have been going on for months. Sx have some typical features, unfortunately her previous ophthalmologist has retired. Will refer to ophthalmology ASAP if ESR is elevated.

## 2023-06-08 NOTE — Assessment & Plan Note (Signed)
 Chronic, she is not on any meds at this time. She agrees to take meds if A1c goes above 7.0. I will check labs as below. Regarding her renal function, encouraged to avoid NSAIDs, stay hydrated and to keep BP well controlled to decrease risk of CKD progression.

## 2023-06-08 NOTE — Assessment & Plan Note (Signed)
Chronic, goal LDL<70.  Importance of following heart healthy lifestyle and statin compliance was stressed to the patient. She will continue with ASA 81mg  and pravastatin 40mg  daily.

## 2023-06-08 NOTE — Progress Notes (Signed)
 I,Leah Wiggins, CMA,acting as a Neurosurgeon for Leah Aliment, MD.,have documented all relevant documentation on the behalf of Leah Aliment, MD,as directed by  Leah Aliment, MD while in the presence of Leah Aliment, MD.  Subjective:  Patient ID: Leah Wiggins , female    DOB: Jun 14, 1926 , 89 y.o.   MRN: [id number]  Chief Complaint  Patient presents with   Diabetes    Patient presents today for bp & dm follow up. She reports compliance with medications. Denies headache, chest pain & sob. She is accompanied by her daughter today. She would like a new nebulizer machine. Her daughter states nothing is wrong with it, just old.    Hypertension    HPI  Discussed the use of AI scribe software for clinical note transcription with the patient, who gave verbal consent to proceed.  History of Present Illness Leah Wiggins is a 89 year old female who presents for diabetes/blood pressure check.  She is accompanied by her daughter.  She experiences intermittent pain all over her head and face, which she insists is not a headache. The pain sometimes extends to her ears. She has not taken any medication for this pain and is unsure if the pain is in her scalp or deeper. No pain when chewing. Her daughter notes that she has previously seen a neurologist and passed all tests, but continues to complain about head pain.  Her vision has been 'getting dim' since her last visit to the eye doctor. She received a new prescription at that time, but feels her vision has not improved. She mentions that her eyes have been feeling funny since her last eye exam.  She has not been using her hearing aids as they are broken, and she has not had her ears checked in over a year. She reports intermittent ear pain.      Diabetes She presents for her follow-up diabetic visit. She has type 2 diabetes mellitus. Her disease course has been stable. Hypoglycemia symptoms include headaches. Pertinent negatives  for diabetes include no blurred vision. There are no hypoglycemic complications. Risk factors for coronary artery disease include diabetes mellitus, dyslipidemia, hypertension, obesity, sedentary lifestyle and post-menopausal.  Hypertension This is a chronic problem. The current episode started more than 1 year ago. The problem has been gradually improving since onset. The problem is controlled. Associated symptoms include headaches. Pertinent negatives include no blurred vision. The current treatment provides moderate improvement.     Past Medical History:  Diagnosis Date   Arthritis    "shoulders" (05/04/2017)   Breast cancer, right breast (HCC) 1991   GERD (gastroesophageal reflux disease)    Hard of hearing    Hyperlipidemia    Hypertension    Memory loss    Non-stress test nonreactive, 04/24/12, normal 12/10/2012   OSA on CPAP    "don't wear it all the time" (05/04/2017)   PVD (peripheral vascular disease) (HCC)    Tobacco use 12/10/2012   Type II diabetes mellitus (HCC)      Family History  Problem Relation Age of Onset   Stroke Mother    Cancer Sister      Current Outpatient Medications:    albuterol (PROVENTIL) (2.5 MG/3ML) 0.083% nebulizer solution, Take 3 mLs (2.5 mg total) by nebulization every 4 (four) hours as needed for wheezing or shortness of breath., Disp: 75 mL, Rfl: 2   ARTIFICIAL TEAR OP, Place 1 drop into both eyes 2 (two) times daily as needed (  dry eyes)., Disp: , Rfl:    aspirin EC 81 MG tablet, Take 81 mg by mouth daily., Disp: , Rfl:    Blood Glucose Monitoring Suppl (FREESTYLE FREEDOM LITE) w/Device KIT, Use to check blood sugar 3 times a day. Dx code e11.65, Disp: 1 kit, Rfl: 3   FERROUS SULFATE PO, Take 2 tablets by mouth daily. gummy, Disp: , Rfl:    mupirocin ointment (BACTROBAN) 2 %, Apply 1 Application topically 2 (two) times daily., Disp: 22 g, Rfl: 0   pravastatin (PRAVACHOL) 40 MG tablet, Take 1 tablet (40 mg total) by mouth daily., Disp: 90 tablet,  Rfl: 3   telmisartan (MICARDIS) 40 MG tablet, TAKE 1 TABLET DAILY, Disp: 90 tablet, Rfl: 3   alendronate (FOSAMAX) 70 MG tablet, Take with a full glass of water on an empty stomach., Disp: 12 tablet, Rfl: 3   budesonide (PULMICORT) 0.25 MG/2ML nebulizer solution, Take 2 mLs (0.25 mg total) by nebulization in the morning and at bedtime. (Patient not taking: Reported on 06/08/2023), Disp: 60 mL, Rfl: 11   Fluocinolone Acetonide (DERMOTIC) 0.01 % OIL, Place 3 drops in ear(s) in the morning and at bedtime. (Patient not taking: Reported on 06/08/2023), Disp: 20 mL, Rfl: 0   formoterol (PERFOROMIST) 20 MCG/2ML nebulizer solution, Take 2 mLs (20 mcg total) by nebulization 2 (two) times daily. (Patient not taking: Reported on 06/08/2023), Disp: 60 mL, Rfl: 11   glucose blood (FREESTYLE LITE) test strip, Use to check blood sugar 3 times a day. Dx code e11.65, Disp: 150 each, Rfl: 3   Spacer/Aero-Holding Chambers (AEROCHAMBER PLUS) inhaler, Use as instructed (Patient not taking: Reported on 06/07/2023), Disp: 1 each, Rfl: 2   triamcinolone cream (KENALOG) 0.1 %, Apply 1 Application topically 2 (two) times daily. (Patient not taking: Reported on 06/08/2023), Disp: 30 g, Rfl: 0   Allergies  Allergen Reactions   Bactrim [Sulfamethoxazole-Trimethoprim] Swelling    Marked angioedema requiring hospitalization   Penicillins Other (See Comments)    Has patient had a PCN reaction causing immediate rash, facial/tongue/throat swelling, SOB or lightheadedness with hypotension: Unk Has patient had a PCN reaction causing severe rash involving mucus membranes or skin necrosis: Unk Has patient had a PCN reaction that required hospitalization: Unk Has patient had a PCN reaction occurring within the last 10 years: No If all of the above answers are "NO", then may proceed with Cephalosporin use.     Review of Systems  Constitutional: Negative.   HENT:  Positive for ear pain.   Eyes:  Negative for blurred vision.  Respiratory:  Negative.    Cardiovascular: Negative.   Neurological:  Positive for headaches.  Psychiatric/Behavioral: Negative.       Today's Vitals   06/08/23 1436  BP: 124/72  Pulse: 76  Temp: 98.6 F (37 C)  SpO2: 98%  Weight: 169 lb 9.6 oz (76.9 kg)  Height: 5\' 3"  (1.6 m)   Body mass index is 30.04 kg/m.  Wt Readings from Last 3 Encounters:  06/08/23 169 lb 9.6 oz (76.9 kg)  06/07/23 167 lb 8 oz (76 kg)  05/17/23 165 lb 9.6 oz (75.1 kg)    BP Readings from Last 3 Encounters:  06/08/23 124/72  06/07/23 (!) 132/48  05/17/23 122/64     Objective:  Physical Exam Vitals and nursing note reviewed.  Constitutional:      Appearance: Normal appearance.  HENT:     Head: Normocephalic and atraumatic.     Comments: Scalp tenderness to palpation Tenderness to palpation  of temporal area  Eyes:     Extraocular Movements: Extraocular movements intact.  Neck:     Comments: Bilateral trapezius mm tenderness to palpation Cardiovascular:     Rate and Rhythm: Normal rate and regular rhythm.     Heart sounds: Normal heart sounds.  Pulmonary:     Effort: Pulmonary effort is normal.     Comments: Decreased breath sounds at bases, moreso on left Musculoskeletal:     Cervical back: Normal range of motion.  Skin:    General: Skin is warm.  Neurological:     General: No focal deficit present.     Mental Status: She is alert.  Psychiatric:        Mood and Affect: Mood normal.        Behavior: Behavior normal.         Assessment And Plan:  Type 2 diabetes mellitus with stage 3a chronic kidney disease, without long-term current use of insulin (HCC) Assessment & Plan: Chronic, she is not on any meds at this time. She agrees to take meds if A1c goes above 7.0. I will check labs as below. Regarding her renal function, encouraged to avoid NSAIDs, stay hydrated and to keep BP well controlled to decrease risk of CKD progression.   Orders: -     Ambulatory referral to Ophthalmology -      Hemoglobin A1c -     PTH, intact and calcium -     Phosphorus -     Protein electrophoresis, serum  Hypertensive heart and chronic kidney disease with heart failure and stage 1 through stage 4 chronic kidney disease, or unspecified chronic kidney disease (HCC) Assessment & Plan: Chronic, controlled.  She will continue with telmisartan 40mg  daily. She is reminded to follow a low sodium diet. She will f/u in four to six months for re-evaluation.    Aortic atherosclerosis (HCC) Assessment & Plan: Chronic, goal LDL<70.  Importance of following heart healthy lifestyle and statin compliance was stressed to the patient. She will continue with ASA 81mg  and pravastatin 40mg  daily.      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 hearing of right ear Assessment & Plan: Chronic, she was previously followed by ENT in [place]. She and her daughter agree to updated Audiology evaluation. Unfortunately, she states her hearing aids are broken.   Orders: -     Ambulatory referral to Audiology  Nonintractable episodic headache, unspecified headache type Assessment & Plan: She appears to have some scalp tenderness today. Will check labs as below.  Intermittent head and facial pain, possibly tension-type headache or scalp muscle tension. - Recommend acetaminophen for head pain. - Advise temple massage with Vicks VapoRub for muscle tension relief. ??? Temporal arteritis -she denies having painful chewing. Also, sx have been going on for months. Sx have some typical features, unfortunately her previous ophthalmologist has retired. Will refer to ophthalmology ASAP if ESR is elevated.    Orders: -     Sedimentation rate -     ANA, IFA (with reflex)  Cancer of upper lobe of left lung [facility]) Assessment & Plan: She is s/p SBRT May 2024.  CT of the chest on 10/13/22 showed interval decrease in the size of the LUL pulmonary lesion with post treatment scarring and interval  decrease in size of the subcarinal lymph node. She is scheduled to have yearly scans.    COPD with chronic bronchitis and emphysema (HCC) Assessment & Plan: COPD, sx are stable. She  denies having any worsening sob. Pulmonary input appreciated, she has had PFTs. She use nebulizer for symptoms, no new issues. No recent exacerbations.    Class 1 obesity due to excess calories with serious comorbidity and body mass index (BMI) of 30.0 to 30.9 in adult Assessment & Plan: Her BMI is acceptable for her demographic. She is encouraged to incorporate more exercise into her daily routine. Encouraged to perform heel raises while watching TV.     General Health Maintenance Emphasized exercises to improve circulation and prevent complications. - Advise calf or heel raises during TV commercials to enhance circulation.    Return if symptoms worsen or fail to improve.  Patient was given opportunity to ask questions. Patient verbalized understanding of the plan and was able to repeat key elements of the plan. All questions were answered to their satisfaction.    I, Leah Aliment, MD, have reviewed all documentation for this visit. The documentation on 06/08/23 for the exam, diagnosis, procedures, and orders are all accurate and complete.   IF YOU HAVE BEEN REFERRED TO A SPECIALIST, IT MAY TAKE 1-2 WEEKS TO SCHEDULE/PROCESS THE REFERRAL. IF YOU HAVE NOT HEARD FROM US/SPECIALIST IN TWO WEEKS, PLEASE GIVE [place] A CALL AT [phone].   THE PATIENT IS ENCOURAGED TO PRACTICE SOCIAL DISTANCING DUE TO THE COVID-19 PANDEMIC.

## 2023-06-08 NOTE — Assessment & Plan Note (Addendum)
 Chronic, she was previously followed by ENT in [place]. She and her daughter agree to updated Audiology evaluation. Unfortunately, she states her hearing aids are broken.

## 2023-06-13 ENCOUNTER — Encounter: Payer: Self-pay | Admitting: Internal Medicine

## 2023-06-13 ENCOUNTER — Ambulatory Visit: Payer: Medicare Other | Admitting: Internal Medicine

## 2023-06-13 VITALS — BP 134/70 | HR 65 | Ht 63.0 in | Wt 166.8 lb

## 2023-06-13 DIAGNOSIS — C3412 Malignant neoplasm of upper lobe, left bronchus or lung: Secondary | ICD-10-CM | POA: Diagnosis not present

## 2023-06-13 DIAGNOSIS — J4489 Other specified chronic obstructive pulmonary disease: Secondary | ICD-10-CM | POA: Diagnosis not present

## 2023-06-13 DIAGNOSIS — J439 Emphysema, unspecified: Secondary | ICD-10-CM | POA: Diagnosis not present

## 2023-06-13 MED ORDER — BUDESONIDE 0.25 MG/2ML IN SUSP: 0.2500 mg | Freq: Two times a day (BID) | RESPIRATORY_TRACT | 11 refills | Status: AC

## 2023-06-13 MED ORDER — IPRATROPIUM-ALBUTEROL 0.5-2.5 (3) MG/3ML IN SOLN: 3.0000 mL | Freq: Four times a day (QID) | RESPIRATORY_TRACT | 11 refills | Status: AC | PRN

## 2023-06-13 NOTE — Progress Notes (Signed)
 Leah Wiggins    [id number]    1927-02-07  Primary Care Physician:Sanders, Melina Schools, MD Date of Appointment: 06/13/2023 Established Patient Visit  Chief complaint:   Chief Complaint  Patient presents with   Follow-up    SOB getting worse, refill on fosamax and needs a new nebulizer machine      HPI: Leah Wiggins is a 89 y.o. woman with history of tobacco use disorder (35 pack years, quit 2020) and dyspnea. LUL lung mass s/p empiric SBRT in May 2024.  Interval Updates: Here for follow up. Having dyspnea on exertion. Thinks it has progressed. Would like a new nebulizer machine.  SBRT completed in May 2024.   Moving around less because she is staying with her sister.  A tree fell into her house. Daughter feels she is being less active.  No fevers or chills. Appetite is good.  Ran out of all her nebulizer treatments.  I have reviewed the patient's family social and past medical history and updated as appropriate.   Past Medical History:  Diagnosis Date   Arthritis    "shoulders" (05/04/2017)   Breast cancer, right breast (HCC) 1991   GERD (gastroesophageal reflux disease)    Hard of hearing    Hyperlipidemia    Hypertension    Memory loss    Non-stress test nonreactive, 04/24/12, normal 12/10/2012   OSA on CPAP    "don't wear it all the time" (05/04/2017)   PVD (peripheral vascular disease) (HCC)    Tobacco use 12/10/2012   Type II diabetes mellitus (HCC)     Past Surgical History:  Procedure Laterality Date   APPENDECTOMY     BIOPSY  01/19/2018   Procedure: BIOPSY;  Surgeon: Jeani Hawking, MD;  Location: [facility];  Service: Endoscopy;;   BREAST BIOPSY Right 1991   CARDIOVASCULAR STRESS TEST  04/24/2012   No significant ST segment change suggestive of ischemia.   CATARACT EXTRACTION, BILATERAL Bilateral    COLONOSCOPY WITH PROPOFOL N/A 01/19/2018   Procedure: COLONOSCOPY WITH PROPOFOL;  Surgeon: Jeani Hawking, MD;  Location: [facility];   Service: Endoscopy;  Laterality: N/A;   ESOPHAGOGASTRODUODENOSCOPY (EGD) WITH PROPOFOL N/A 01/19/2018   Procedure: ESOPHAGOGASTRODUODENOSCOPY (EGD) WITH PROPOFOL;  Surgeon: Jeani Hawking, MD;  Location: [facility];  Service: Endoscopy;  Laterality: N/A;   JOINT REPLACEMENT     LOWER EXTREMITY ARTERIAL DOPPLER Bilateral 05/07/2012   Left ABI-demonstrated mild arterial insufficiency. Right CIA 50-69% diameter reduction. Right SFA less than 50% diameter reduction. Left SFA 70-99% diameter reduction. Bilateral Runoff-Posterior tibial arteries appeared occluded.   MASTECTOMY PARTIAL / LUMPECTOMY W/ AXILLARY LYMPHADENECTOMY Right 1991    TOTAL HIP ARTHROPLASTY Right 2011    Family History  Problem Relation Age of Onset   Stroke Mother    Cancer Sister     Social History   Occupational History   Occupation: retired  Tobacco Use   Smoking status: Former    Current packs/day: 0.00    Average packs/day: 0.5 packs/day for 71.0 years (35.5 ttl pk-yrs)    Types: Cigarettes    Start date: 03/08/1947    Quit date: 2020    Years since quitting: 5.2   Smokeless tobacco: Never   Tobacco comments:    has tried cold [place]  Vaping Use   Vaping status: Never Used  Substance and Sexual Activity   Alcohol use: No    Alcohol/week: 0.0 standard drinks of alcohol   Drug use: No   Sexual  activity: Not Currently    Partners: Male    Birth control/protection: Post-menopausal     Physical Exam: Blood pressure 134/70, pulse 65, height 5\' 3"  (1.6 m), weight 166 lb 12.8 oz (75.7 kg), SpO2 100%.  Gen:     Well developed and nourished, elderly Lungs:    kyphosis, diminished, no wheeze, no increased wob CV:       RRR Ext: Stable RUE lymphedema   Data Reviewed: Imaging: I have personally reviewed the CT Chest Dec 2024 which shows stable LUL spiculated mass. It is reduced in size following radiation therapy in May 2024.   PFTs:     Latest Ref Rng & Units 12/28/2021   10:56 AM  PFT Results   FVC-Pre L 1.43   FVC-Predicted Pre % 76   FVC-Post L 1.44   FVC-Predicted Post % 77   Pre FEV1/FVC % % 65   Post FEV1/FCV % % 65   FEV1-Pre L 0.93   FEV1-Predicted Pre % 68   FEV1-Post L 0.94   DLCO uncorrected ml/min/mmHg 10.39   DLCO corrected ml/min/mmHg 10.39   TLC L 4.81   TLC % Predicted % 98   RV % Predicted % 128     Labs: Lab Results  Component Value Date   WBC 5.3 06/07/2023   HGB 12.2 06/07/2023   HCT 38.5 06/07/2023   MCV 77.0 (L) 06/07/2023   PLT PLATELET CLUMPS NOTED ON SMEAR, UNABLE TO ESTIMATE 06/07/2023   Lab Results  Component Value Date   NA 142 06/07/2023   K 4.2 06/07/2023   CL 107 06/07/2023   CO2 31 06/07/2023     Immunization status: Immunization History  Administered Date(s) Administered   Influenza Whole 02/16/2010   PFIZER(Purple Top)SARS-COV-2 Vaccination 04/29/2019, 05/20/2019, 01/09/2020, 09/22/2020   PNEUMOCOCCAL CONJUGATE-20 01/25/2021   Pneumococcal-Unspecified 06/19/2013   Tdap 01/25/2021    External Records Personally Reviewed:   Assessment:  Moderate COPD, FEV1 68% of predicted Left upper lobe lung mass s/p empiric SBRT therapy completed May 2024.   Plan/Recommendations:  Continue follow up with Dr. Karoline Caldwell for the Lung cancer.   Continue the breathing treatments as you are doing for COPD.  I will send a new nebulizer machine order in.  Call me sooner if issues.    Return to Care: Return in about 6 months (around 12/13/2023).    Durel Salts, MD Pulmonary and Critical Care Medicine [facility] Office:[phone]

## 2023-06-13 NOTE — Patient Instructions (Addendum)
 It was a pleasure to see you today!  Please schedule follow up scheduled with myself in 6 months.  If my schedule is not open yet, we will contact you with a reminder closer to that time. Please call [phone] if you haven't heard from [place] a month before, and always call us sooner if issues or concerns arise. You can also send [place] a message through MyChart, but but aware that this is not to be used for urgent issues and it may take up to 5-7 days to receive a reply. Please be aware that you will likely be able to view your results before I have a chance to respond to them. Please give [place] 5 business days to respond to any non-urgent results.     I will refill the budesonide (aka pulmicort) inhaler once in the morning, once at night.  I am changing the albuterol to albuterol-ipratropium (aka Duoneb) you can take this up to 4 times a day as needed for shortness of breath.   CT Scan in December shows stable Left sided lung mass. Keep follow up annually with Dr. Karoline Caldwell.

## 2023-06-15 ENCOUNTER — Ambulatory Visit: Payer: Medicare Other | Admitting: Internal Medicine

## 2023-06-15 LAB — PROTEIN ELECTROPHORESIS, SERUM
A/G Ratio: 1.2 (ref 0.7–1.7)
Albumin ELP: 3.6 g/dL (ref 2.9–4.4)
Alpha 1: 0.2 g/dL (ref 0.0–0.4)
Alpha 2: 0.9 g/dL (ref 0.4–1.0)
Beta: 1.1 g/dL (ref 0.7–1.3)
Gamma Globulin: 0.8 g/dL (ref 0.4–1.8)
Globulin, Total: 3 g/dL (ref 2.2–3.9)
Total Protein: 6.6 g/dL (ref 6.0–8.5)

## 2023-06-15 LAB — PHOSPHORUS: Phosphorus: 3.8 mg/dL (ref 3.0–4.3)

## 2023-06-15 LAB — PTH, INTACT AND CALCIUM
Calcium: 9.3 mg/dL (ref 8.7–10.3)
PTH: 27 pg/mL (ref 15–65)

## 2023-06-15 LAB — ANTINUCLEAR ANTIBODIES, IFA: ANA Titer 1: POSITIVE — AB

## 2023-06-15 LAB — HEMOGLOBIN A1C
Est. average glucose Bld gHb Est-mCnc: 148 mg/dL
Hgb A1c MFr Bld: 6.8 % — ABNORMAL HIGH (ref 4.8–5.6)

## 2023-06-15 LAB — FANA STAINING PATTERNS: Homogeneous Pattern: 1:320 {titer} — ABNORMAL HIGH

## 2023-06-15 LAB — SEDIMENTATION RATE: Sed Rate: 25 mm/h (ref 0–40)

## 2023-06-27 ENCOUNTER — Encounter: Payer: Self-pay | Admitting: Internal Medicine

## 2023-07-27 ENCOUNTER — Encounter: Payer: Self-pay | Admitting: Podiatry

## 2023-07-27 ENCOUNTER — Ambulatory Visit (INDEPENDENT_AMBULATORY_CARE_PROVIDER_SITE_OTHER): Admitting: Podiatry

## 2023-07-27 DIAGNOSIS — M79609 Pain in unspecified limb: Secondary | ICD-10-CM

## 2023-07-27 DIAGNOSIS — B351 Tinea unguium: Secondary | ICD-10-CM | POA: Diagnosis not present

## 2023-07-27 DIAGNOSIS — E1142 Type 2 diabetes mellitus with diabetic polyneuropathy: Secondary | ICD-10-CM | POA: Diagnosis not present

## 2023-07-27 NOTE — Progress Notes (Signed)
 This patient returns to my office for at risk foot care.  This patient requires this care by a professional since this patient will be at risk due to having  DM  CKD and thrombocytopenia.  This patient is unable to cut nails herself since the patient cannot reach her nails.These nails are painful walking and wearing shoes.  The patient is accompanied by her daughter. This patient presents for at risk foot care today.  General Appearance  Alert, conversant and in no acute stress.  Vascular  Dorsalis pedis and posterior tibial  pulses are  weakly palpable  bilaterally.  Capillary return is within normal limits  bilaterally. Temperature is within normal limits  bilaterally.  Neurologic  Senn-Weinstein monofilament wire test within normal limits  bilaterally. Muscle power within normal limits bilaterally.  Nails Thick disfigured discolored nails with subungual debris  from hallux to fifth toes bilaterally. No evidence of bacterial infection or drainage bilaterally.  Orthopedic  No limitations of motion  feet .  No crepitus or effusions noted.  No bony pathology or digital deformities noted.  Skin  normotropic skin with no porokeratosis noted bilaterally.  HAV  B/L.    Onychomycosis  Pain in right toes  Pain in left toes  Consent was obtained for treatment procedures.   Mechanical debridement of nails 1-5  bilaterally performed with a nail nipper.  Filed with dremel without incident.    Return office visit    3 months  per Dr.  Johnette Naegeli                Told patient to return for periodic foot care and evaluation due to potential at risk complications.   Ruffin Cotton DPM

## 2023-08-01 ENCOUNTER — Ambulatory Visit: Attending: Internal Medicine | Admitting: Audiology

## 2023-08-01 DIAGNOSIS — H903 Sensorineural hearing loss, bilateral: Secondary | ICD-10-CM | POA: Diagnosis not present

## 2023-08-01 NOTE — Procedures (Signed)
 Outpatient Audiology and [facility] [address] [place], [place]  [address] [phone]  AUDIOLOGICAL  EVALUATION  NAME: Leah Wiggins     DOB:   1926/10/20      MRN: [id number]                                                                                     DATE: 08/01/2023     REFERENT: Cleave Curling, MD STATUS: Outpatient DIAGNOSIS: Sensorineural hearing loss, bilateral  History: Leah Wiggins was seen for an audiological evaluation due to a history of decreased hearing sensitivity for 40+ years.  She was accompanied to the appointment by her daughter. Leah Wiggins reports bilateral otalgia which she describes as intermittent.  She reports intermittent tinnitus.  She denies aural fullness. Leah Wiggins reports she has previously utilized hearing aids however she has recently lost her hearing aids. Leah Wiggins's daughter reports she was followed by [facility] in [place] for ENT and for her hearing aids. Leah Wiggins denies a history of ear surgeries. Leah Wiggins's daughter reports Leah Wiggins has difficulty hearing and communicating in everyday environments. Leah Wiggins has difficulty hearing the television.   Evaluation:  Otoscopy showed a clear view of the tympanic membranes, bilaterally Tympanometry results were consistent in the right ear with normal tympanic membrane mobility and normal middle ear pressure (Type A) and in the left ear with no tympanic membrane mobility and middle ear dysfunction (Type B)  Audiometric testing was completed using Conventional Audiometry techniques with insert earphones and TDH headphones. Test results are consistent in the right ear with a mild sloping to a moderately-severe sensorineural hearing loss. Test results are consistent in the left ear with a moderate sloping to profound mixed and sensorineural hearing loss. Asymmetric hearing loss noted, at [phone] and 3000-8000 Hz worse in the left ear. Speech Recognition  Thresholds were obtained at 50 dB HL in the right ear. A speech Detection Threshold was obtained at 45  dB HL in the left ear. Word Recognition Testing was completed at 80 dB HL and Leah Wiggins scored 80% in the right ear and was completed at 90 dB HL and Leah Wiggins scored 28% in the left ear. Asymmetric word recognition noted, worse in the left ear.    Results:  The test results were reviewed with Leah Wiggins and her daughter.  Today's test results are consistent in the right ear with a mild sloping to a moderately-severe sensorineural hearing loss. Test results are consistent in the left ear with a moderate sloping to profound mixed and sensorineural hearing loss. Asymmetric hearing loss noted, at [phone] and 3000-8000 Hz worse in the left ear.  Asymmetric word recognition noted, worse in the left ear. Leah Wiggins will have hearing and communication difficulty in all listening environments. She will benefit from the use of good communication strategies and the use of hearing aids or a personal amplification device. A pocket talker was used with [name] during counseling. Leah Wiggins reported benefit from the use of a pocket talker. Leah Wiggins and her daughter were counseled regarding the use of hearing aids and was given a list of hearing aid providers in the [place] area if motivated to pursue amplification. Leah Wiggins's daughter  was given information on how to order a pocket talker.   Recommendations: 1.   Use of a Pocket Talker for everyday needs 2.   Evaluation for hearing aids by an Audiologist who dispenses hearing aids if motivated to pursue traditional amplification.  3.   Monitor hearing sensitivity   40 minutes spent testing and counseling on results.   If you have any questions please feel free to contact me at [phone].  Bert Britain Audiologist, Au.D., CCC-A 08/01/2023  3:08 PM  Cc: Cleave Curling, MD

## 2023-08-19 ENCOUNTER — Other Ambulatory Visit: Payer: Self-pay | Admitting: Internal Medicine

## 2023-09-15 ENCOUNTER — Ambulatory Visit (HOSPITAL_COMMUNITY)
Admission: RE | Admit: 2023-09-15 | Discharge: 2023-09-15 | Disposition: A | Discharge status: Home / Self Care | Attending: Family Medicine | Admitting: Family Medicine

## 2023-09-15 ENCOUNTER — Emergency Department (HOSPITAL_COMMUNITY)

## 2023-09-15 ENCOUNTER — Encounter (HOSPITAL_COMMUNITY): Payer: Self-pay

## 2023-09-15 ENCOUNTER — Other Ambulatory Visit: Payer: Self-pay

## 2023-09-15 ENCOUNTER — Encounter (HOSPITAL_COMMUNITY): Payer: Self-pay | Admitting: *Deleted

## 2023-09-15 ENCOUNTER — Emergency Department (HOSPITAL_COMMUNITY)
Admission: EM | Admit: 2023-09-15 | Discharge: 2023-09-15 | Discharge status: Against Medical Advice | Attending: Emergency Medicine | Admitting: Emergency Medicine

## 2023-09-15 VITALS — BP 149/65 | HR 76 | Temp 98.5°F | Resp 18

## 2023-09-15 DIAGNOSIS — Z5321 Procedure and treatment not carried out due to patient leaving prior to being seen by health care provider: Secondary | ICD-10-CM | POA: Diagnosis not present

## 2023-09-15 DIAGNOSIS — M79602 Pain in left arm: Secondary | ICD-10-CM | POA: Insufficient documentation

## 2023-09-15 DIAGNOSIS — N644 Mastodynia: Secondary | ICD-10-CM | POA: Insufficient documentation

## 2023-09-15 DIAGNOSIS — R0789 Other chest pain: Secondary | ICD-10-CM | POA: Insufficient documentation

## 2023-09-15 DIAGNOSIS — R918 Other nonspecific abnormal finding of lung field: Secondary | ICD-10-CM | POA: Diagnosis not present

## 2023-09-15 DIAGNOSIS — R079 Chest pain, unspecified: Secondary | ICD-10-CM | POA: Diagnosis not present

## 2023-09-15 DIAGNOSIS — R0602 Shortness of breath: Secondary | ICD-10-CM | POA: Diagnosis not present

## 2023-09-15 LAB — CBC
HCT: 36.5 % (ref 36.0–46.0)
Hemoglobin: 11.6 g/dL — ABNORMAL LOW (ref 12.0–15.0)
MCH: 24.8 pg — ABNORMAL LOW (ref 26.0–34.0)
MCHC: 31.8 g/dL (ref 30.0–36.0)
MCV: 78.2 fL — ABNORMAL LOW (ref 80.0–100.0)
Platelets: UNDETERMINED K/uL (ref 150–400)
RBC: 4.67 MIL/uL (ref 3.87–5.11)
RDW: 17.4 % — ABNORMAL HIGH (ref 11.5–15.5)
WBC: 5.4 K/uL (ref 4.0–10.5)
nRBC: 0 % (ref 0.0–0.2)

## 2023-09-15 LAB — BASIC METABOLIC PANEL WITH GFR
Anion gap: 10 (ref 5–15)
BUN: 21 mg/dL (ref 8–23)
CO2: 26 mmol/L (ref 22–32)
Calcium: 9.2 mg/dL (ref 8.9–10.3)
Chloride: 108 mmol/L (ref 98–111)
Creatinine, Ser: 1.04 mg/dL — ABNORMAL HIGH (ref 0.44–1.00)
GFR, Estimated: 50 mL/min — ABNORMAL LOW (ref >60)
Glucose, Bld: 113 mg/dL — ABNORMAL HIGH (ref 70–99)
Potassium: 3.9 mmol/L (ref 3.5–5.1)
Sodium: 144 mmol/L (ref 135–145)

## 2023-09-15 LAB — TROPONIN I (HIGH SENSITIVITY): Troponin I (High Sensitivity): 8 ng/L (ref <18)

## 2023-09-15 NOTE — ED Notes (Signed)
 Patient is being discharged from the Urgent Care and sent to the Emergency Department via POV with family. Per [place]  Dreama, NP, patient is in need of higher level of care due to chest pain, breast pain. Patient is aware and verbalizes understanding of plan of care.  Vitals:   09/15/23 1456  BP: (!) 149/65  Pulse: 76  Resp: 18  Temp: 98.5 F (36.9 C)  SpO2: 96%

## 2023-09-15 NOTE — ED Triage Notes (Signed)
 Pt to ED via POV from UC c/o left sided chest pain and arm pain x 1 week.

## 2023-09-15 NOTE — ED Notes (Signed)
 Pt. Called x4 for room, no response.

## 2023-09-15 NOTE — Discharge Instructions (Addendum)
 Head to the nearest emergency department for further evaluation of your chest pain and shortness of breath.

## 2023-09-15 NOTE — ED Provider Notes (Signed)
 [facility]    CSN: [id number] Arrival date & time: 09/15/23  1444      History   Chief Complaint Chief Complaint  Patient presents with   Breast Pain   Arm Pain   Chest Pain    HPI Leah Wiggins is a 89 y.o. female.   Patient brought into clinic by daughter over concern of left-sided chest pain that does radiate over into her right chest.  This has been ongoing for the past few weeks.  Noticed some left arm pain when she woke up this morning, did sleep on her left side.  Endorses some shortness of breath.    Does have a history of COPD, GERD, hypertension, hyperlipidemia, type 2 diabetes, history of breast cancer and chronic left shoulder pain.    The history is provided by the patient, medical records and a caregiver.  Arm Pain  Chest Pain   Past Medical History:  Diagnosis Date   Arthritis    shoulders (05/04/2017)   Breast cancer, right breast (HCC) 1991   GERD (gastroesophageal reflux disease)    Hard of hearing    Hyperlipidemia    Hypertension    Memory loss    Non-stress test nonreactive, 04/24/12, normal 12/10/2012   OSA on CPAP    don't wear it all the time (05/04/2017)   PVD (peripheral vascular disease) (HCC)    Tobacco use 12/10/2012   Type II diabetes mellitus (HCC)     Patient Active Problem List   Diagnosis Date Noted   Nonintractable episodic headache 06/08/2023   Chronic left shoulder pain 02/19/2023   Epistaxis 02/13/2023   Mastodynia of left breast 02/13/2023   Leukocytes in urine 10/10/2022   Mass of left lung 02/21/2022   Gait abnormality 02/21/2022   Overweight with body mass index (BMI) of 29 to 29.9 in adult 02/21/2022   Cancer of upper lobe of left lung (HCC) 01/26/2022   Hypertensive heart and chronic kidney disease with heart failure and stage 1 through stage 4 chronic kidney disease, or unspecified chronic kidney disease (HCC) 09/03/2020   Type 2 diabetes mellitus with stage 3a chronic kidney disease, without  long-term current use of insulin  (HCC) 09/03/2020   Aortic atherosclerosis (HCC) 09/03/2020   Absolute anemia 09/03/2020   Cellulitis of left foot 09/03/2020   Class 1 obesity due to excess calories with serious comorbidity and body mass index (BMI) of 30.0 to 30.9 in adult 09/03/2020   Angiodysplasia of intestine 06/04/2020   Encounter for general adult medical examination w/o abnormal findings 06/04/2020   Gastroesophageal reflux disease 06/04/2020   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 hearing of right ear 06/04/2020   Prolapsed internal hemorrhoids 06/04/2020   Rectal bleeding 06/04/2020   Sensorineural hearing loss (SNHL) of right ear with restricted hearing of left ear 06/04/2020   Mild cognitive impairment 12/23/2019   Memory loss 06/24/2019   Intrinsic atopic dermatitis 09/18/2018   COPD with chronic bronchitis and emphysema (HCC) 04/06/2018   Prediabetes 02/07/2018   Iron deficiency anemia due to chronic blood loss 12/25/2017   Anaphylaxis 05/04/2017   Rash and nonspecific skin eruption 05/04/2017   History of breast cancer 08/14/2014   Empty sella (HCC) 07/14/2014   Pain in the chest 04/28/2014   Diabetes mellitus without complication (HCC) 03/03/2014   Chest pain 03/03/2014   Essential hypertension 06/24/2013   PAD (peripheral artery disease), abnormal ABIs 12/10/2012   Non-stress test nonreactive, 04/24/12, normal 12/10/2012  Dyslipidemia 12/10/2012   Tobacco use 12/10/2012   Thrombocytopenia (HCC) 01/24/2012   RESTLESS LEG SYNDROME 02/16/2010   METHICILLIN SUSECPTIBLE PNEUMONIA STAPH AUREUS 01/13/2010   HIP THI LEG&ANK ABRASION/FRICION BURN W/O INF 01/13/2010    Past Surgical History:  Procedure Laterality Date   APPENDECTOMY     BIOPSY  01/19/2018   Procedure: BIOPSY;  Surgeon: Rollin Dover, MD;  Location: [facility];  Service: Endoscopy;;   BREAST BIOPSY Right 1991   CARDIOVASCULAR STRESS TEST  04/24/2012   No  significant ST segment change suggestive of ischemia.   CATARACT EXTRACTION, BILATERAL Bilateral    COLONOSCOPY WITH PROPOFOL  N/A 01/19/2018   Procedure: COLONOSCOPY WITH PROPOFOL ;  Surgeon: Rollin Dover, MD;  Location: [facility];  Service: Endoscopy;  Laterality: N/A;   ESOPHAGOGASTRODUODENOSCOPY (EGD) WITH PROPOFOL  N/A 01/19/2018   Procedure: ESOPHAGOGASTRODUODENOSCOPY (EGD) WITH PROPOFOL ;  Surgeon: Rollin Dover, MD;  Location: [facility];  Service: Endoscopy;  Laterality: N/A;   JOINT REPLACEMENT     LOWER EXTREMITY ARTERIAL DOPPLER Bilateral 05/07/2012   Left ABI-demonstrated mild arterial insufficiency. Right CIA 50-69% diameter reduction. Right SFA less than 50% diameter reduction. Left SFA 70-99% diameter reduction. Bilateral Runoff-Posterior tibial arteries appeared occluded.   MASTECTOMY PARTIAL / LUMPECTOMY W/ AXILLARY LYMPHADENECTOMY Right 1991    TOTAL HIP ARTHROPLASTY Right 2011    OB History     Gravida  6   Para  6   Term      Preterm      AB      Living  8      SAB      IAB      Ectopic      Multiple  2   Live Births  8            Home Medications    Prior to Admission medications   Medication Sig Start Date End Date Taking? Authorizing Provider  alendronate  (FOSAMAX ) 70 MG tablet TAKE 1 TABLET EVERY WEDNESDAY WITH A FULL GLASS OF WATER ON AN EMPTY STOMACH 08/21/23   Jarold Medici, MD  ARTIFICIAL TEAR OP Place 1 drop into both eyes 2 (two) times daily as needed (dry eyes).    [provider]  aspirin  EC 81 MG tablet Take 81 mg by mouth daily.    [provider]  Blood Glucose Monitoring Suppl (FREESTYLE FREEDOM LITE) w/Device KIT Use to check blood sugar 3 times a day. Dx code e11.65 09/18/19   Jarold Medici, MD  budesonide  (PULMICORT ) 0.25 MG/2ML nebulizer solution Take 2 mLs (0.25 mg total) by nebulization in the morning and at bedtime. 06/13/23   Meade Verdon RAMAN, MD  FERROUS SULFATE  PO Take 2 tablets by mouth daily. gummy     [provider]  formoterol  (PERFOROMIST ) 20 MCG/2ML nebulizer solution Take 2 mLs (20 mcg total) by nebulization 2 (two) times daily. 12/27/21   Desai, Nikita S, MD  ipratropium-albuterol  (DUONEB) 0.5-2.5 (3) MG/3ML SOLN Take 3 mLs by nebulization every 6 (six) hours as needed. 06/13/23   Desai, Nikita S, MD  mupirocin  ointment (BACTROBAN ) 2 % Apply 1 Application topically 2 (two) times daily. 09/10/22   Dreama, Dereona Kolodny  N, FNP  pravastatin  (PRAVACHOL ) 40 MG tablet TAKE 1 TABLET DAILY 08/21/23   Jarold Medici, MD  Spacer/Aero-Holding Chambers (AEROCHAMBER PLUS) inhaler Use as instructed 08/19/19   Jarold Medici, MD  telmisartan  (MICARDIS ) 40 MG tablet TAKE 1 TABLET DAILY 05/31/23   Jarold Medici, MD  triamcinolone  cream (KENALOG ) 0.1 % Apply 1 Application topically 2 (  two) times daily. 09/25/22   Dreama Vana SAILOR, FNP    Family History Family History  Problem Relation Age of Onset   Stroke Mother    Cancer Sister     Social History Social History   Tobacco Use   Smoking status: Former    Current packs/day: 0.00    Average packs/day: 0.5 packs/day for 71.0 years (35.5 ttl pk-yrs)    Types: Cigarettes    Start date: 03/08/1947    Quit date: 2020    Years since quitting: 5.5   Smokeless tobacco: Never   Tobacco comments:    has tried cold [place]  Vaping Use   Vaping status: Never Used  Substance Use Topics   Alcohol use: No    Alcohol/week: 0.0 standard drinks of alcohol   Drug use: No     Allergies   Bactrim [sulfamethoxazole-trimethoprim] and Penicillins   Review of Systems Review of Systems  Per HPI  Physical Exam Triage Vital Signs ED Triage Vitals  Encounter Vitals Group     BP 09/15/23 1456 (!) 149/65     Girls Systolic BP Percentile --      Girls Diastolic BP Percentile --      Boys Systolic BP Percentile --      Boys Diastolic BP Percentile --      Pulse Rate 09/15/23 1456 76     Resp 09/15/23 1456 18     Temp 09/15/23 1456 98.5 F (36.9 C)      Temp Source 09/15/23 1456 Oral     SpO2 09/15/23 1456 96 %     Weight --      Height --      Head Circumference --      Peak Flow --      Pain Score 09/15/23 1455 0     Pain Loc --      Pain Education --      Exclude from Growth Chart --    No data found.  Updated Vital Signs BP (!) 149/65 (BP Location: Left Arm)   Pulse 76   Temp 98.5 F (36.9 C) (Oral)   Resp 18   SpO2 96%   Visual Acuity Right Eye Distance:   Left Eye Distance:   Bilateral Distance:    Right Eye Near:   Left Eye Near:    Bilateral Near:     Physical Exam Vitals and nursing note reviewed.  Constitutional:      Appearance: She is well-developed.  HENT:     Head: Normocephalic and atraumatic.  Cardiovascular:     Rate and Rhythm: Normal rate and regular rhythm.  Pulmonary:     Effort: Pulmonary effort is normal. No tachypnea.     Breath sounds: Normal breath sounds. No decreased breath sounds, wheezing or rhonchi.  Musculoskeletal:        General: Normal range of motion.  Skin:    General: Skin is warm and dry.  Neurological:     General: No focal deficit present.     Mental Status: She is alert and oriented to person, place, and time.  Psychiatric:        Mood and Affect: Mood normal.        Behavior: Behavior normal.      UC Treatments / Results  Labs (all labs ordered are listed, but only abnormal results are displayed) Labs Reviewed - No data to display  EKG   Radiology No results found.  Procedures Procedures (including critical care time)  Medications Ordered in UC Medications - No data to display  Initial Impression / Assessment and Plan / UC Course  I have reviewed the triage vital signs and the nursing notes.  Pertinent labs & imaging results that were available during my care of the patient were reviewed by me and considered in my medical decision making (see chart for details).  Vitals and triage reviewed, patient is hemodynamically stable.  Lungs vesicular,  heart with regular rate and rhythm.  EKG shows a rate of 75 bpm, no ST elevation or ST depression.  Patient does have complex medical history and is advanced in age.  Discussed that she would benefit from further advanced evaluation and particularly troponin at the nearest emergency department.  Agreeable to transport, will head via POV.     Final Clinical Impressions(s) / UC Diagnoses   Final diagnoses:  Left-sided chest pain  Shortness of breath     Discharge Instructions      Head to the nearest emergency department for further evaluation of your chest pain and shortness of breath.    ED Prescriptions   None    PDMP not reviewed this encounter.   Dreama, Zenovia Justman  N, FNP 09/15/23 1521

## 2023-09-15 NOTE — ED Triage Notes (Signed)
 The pt is c/o lt arm and breast pain for 3 weeks  she was sent here from urgent care

## 2023-09-15 NOTE — ED Notes (Signed)
 Pt called multiple times for a room with no respond.

## 2023-09-15 NOTE — ED Triage Notes (Signed)
 Pt c/o left breast, arm and lateral side of chest for a couple weeks. Reports when I pee it hurts worse. Pt denies pain at this time, states that hurts when moves arm.

## 2023-09-26 ENCOUNTER — Telehealth: Payer: Self-pay | Admitting: *Deleted

## 2023-09-26 NOTE — Telephone Encounter (Signed)
 Called patient's daughterGLENWOOD Maeola Wiggins to inform of Ct for 10-16-23- arrival time- 12:45 pm @ [facility], no restrictions to scan, patient to receive results from Dr. Shannon 10/19/23 @ 11 am, spoke with patient's daughter- Maeola Wiggins and she is aware of these appts. and the instructions

## 2023-09-29 DIAGNOSIS — M25512 Pain in left shoulder: Secondary | ICD-10-CM | POA: Diagnosis not present

## 2023-10-12 ENCOUNTER — Encounter: Payer: Self-pay | Admitting: Internal Medicine

## 2023-10-12 ENCOUNTER — Ambulatory Visit: Payer: Medicare Other | Admitting: Internal Medicine

## 2023-10-12 VITALS — BP 120/72 | HR 80 | Temp 98.3°F | Ht 63.0 in | Wt 164.2 lb

## 2023-10-12 DIAGNOSIS — J4489 Other specified chronic obstructive pulmonary disease: Secondary | ICD-10-CM

## 2023-10-12 DIAGNOSIS — I739 Peripheral vascular disease, unspecified: Secondary | ICD-10-CM

## 2023-10-12 DIAGNOSIS — I130 Hypertensive heart and chronic kidney disease with heart failure and stage 1 through stage 4 chronic kidney disease, or unspecified chronic kidney disease: Secondary | ICD-10-CM | POA: Diagnosis not present

## 2023-10-12 DIAGNOSIS — N1831 Chronic kidney disease, stage 3a: Secondary | ICD-10-CM

## 2023-10-12 DIAGNOSIS — E1122 Type 2 diabetes mellitus with diabetic chronic kidney disease: Secondary | ICD-10-CM | POA: Diagnosis not present

## 2023-10-12 DIAGNOSIS — I700 Atherosclerosis of aorta: Secondary | ICD-10-CM

## 2023-10-12 DIAGNOSIS — C3412 Malignant neoplasm of upper lobe, left bronchus or lung: Secondary | ICD-10-CM

## 2023-10-12 DIAGNOSIS — I129 Hypertensive chronic kidney disease with stage 1 through stage 4 chronic kidney disease, or unspecified chronic kidney disease: Secondary | ICD-10-CM | POA: Diagnosis not present

## 2023-10-12 DIAGNOSIS — J439 Emphysema, unspecified: Secondary | ICD-10-CM

## 2023-10-12 DIAGNOSIS — Z0000 Encounter for general adult medical examination without abnormal findings: Secondary | ICD-10-CM | POA: Diagnosis not present

## 2023-10-12 DIAGNOSIS — H6123 Impacted cerumen, bilateral: Secondary | ICD-10-CM | POA: Diagnosis not present

## 2023-10-12 LAB — POCT URINALYSIS DIP (CLINITEK)
Bilirubin, UA: NEGATIVE
Glucose, UA: NEGATIVE mg/dL
Ketones, POC UA: NEGATIVE mg/dL
Nitrite, UA: NEGATIVE
POC PROTEIN,UA: NEGATIVE
Spec Grav, UA: >=1.03 — AB (ref 1.010–1.025)
Urobilinogen, UA: 0.2 U/dL
pH, UA: 5.5 (ref 5.0–8.0)

## 2023-10-12 MED ORDER — DICLOFENAC SODIUM 1 % EX GEL: 4.0000 g | Freq: Four times a day (QID) | CUTANEOUS | 1 refills | Status: AC

## 2023-10-12 NOTE — Patient Instructions (Signed)

## 2023-10-12 NOTE — Progress Notes (Signed)
 I,Leah Wiggins, CMA,acting as a Neurosurgeon for Leah LOISE Slocumb, MD.,have documented all relevant documentation on the behalf of Leah LOISE Slocumb, MD,as directed by  Leah LOISE Slocumb, MD while in the presence of Leah LOISE Slocumb, MD.  Subjective:    Patient ID: Leah Wiggins , female    DOB: 07/26/1926 , 89 y.o.   MRN: [id number]  Chief Complaint  Patient presents with   Annual Exam    Patient presents today for annual exam. She is accompanied by her daughter today. She reports compliance with medications. Denies headache, chest pain & sob.    Diabetes   Hypertension    HPI Discussed the use of AI scribe software for clinical note transcription with the patient, who gave verbal consent to proceed.  History of Present Illness Leah Wiggins is a 89 year old female with diabetes and COPD who presents for a physical and diabetes check.  She experiences persistent pain in her left shoulder following an injection received in December. The pain began after a different provider administered the injection compared to previous times when she did not experience pain.  She has a history of lung cancer and is scheduled for a CT scan and lab work to follow up on her condition. She has an appointment with her oncologist to discuss the results.  She visited urgent care on July 11th due to breast, arm, and chest pain. She did not go to the ER as advised.  She has COPD and uses an inhaler, Pulmicort , which she notes is stronger than before. She confirms regular use of the inhaler.  She reports ongoing issues with her feet, including soreness and concerns about circulation. Her toenails are sore and grow slowly. She describes nighttime pain in her feet, which sometimes stings or burns. She has been prescribed gabapentin  in the past but is not currently taking it. She has seen multiple foot doctors but feels her concerns have not been adequately addressed.  She is overdue for an eye exam, with  her next appointment scheduled for December.  She takes several medications including Fosamax  once weekly, baby aspirin , pravastatin  40 mg daily, and telmisartan  40 mg daily.   Diabetes She presents for her follow-up diabetic visit. She has type 2 diabetes mellitus. Her disease course has been stable. There are no hypoglycemic associated symptoms. Pertinent negatives for diabetes include no blurred vision. There are no hypoglycemic complications. Risk factors for coronary artery disease include diabetes mellitus, dyslipidemia, hypertension, obesity, sedentary lifestyle and post-menopausal.  Hypertension This is a chronic problem. The current episode started more than 1 year ago. The problem has been gradually improving since onset. The problem is controlled. Pertinent negatives include no blurred vision. The current treatment provides moderate improvement.     Past Medical History:  Diagnosis Date   Arthritis    shoulders (05/04/2017)   Breast cancer, right breast (HCC) 1991   GERD (gastroesophageal reflux disease)    Hard of hearing    Hyperlipidemia    Hypertension    Memory loss    Non-stress test nonreactive, 04/24/12, normal 12/10/2012   OSA on CPAP    don't wear it all the time (05/04/2017)   PVD (peripheral vascular disease) (HCC)    Tobacco use 12/10/2012   Type II diabetes mellitus (HCC)      Family History  Problem Relation Age of Onset   Stroke Mother    Cancer Sister      Current Outpatient Medications:    alendronate  (  FOSAMAX ) 70 MG tablet, TAKE 1 TABLET EVERY WEDNESDAY WITH A FULL GLASS OF WATER ON AN EMPTY STOMACH, Disp: 12 tablet, Rfl: 3   ARTIFICIAL TEAR OP, Place 1 drop into both eyes 2 (two) times daily as needed (dry eyes)., Disp: , Rfl:    aspirin  EC 81 MG tablet, Take 81 mg by mouth daily., Disp: , Rfl:    Blood Glucose Monitoring Suppl (FREESTYLE FREEDOM LITE) w/Device KIT, Use to check blood sugar 3 times a day. Dx code e11.65, Disp: 1 kit, Rfl: 3    budesonide  (PULMICORT ) 0.25 MG/2ML nebulizer solution, Take 2 mLs (0.25 mg total) by nebulization in the morning and at bedtime., Disp: 60 mL, Rfl: 11   diclofenac  Sodium (VOLTAREN ) 1 % GEL, Apply 4 g topically 4 (four) times daily. prn, Disp: 150 g, Rfl: 1   FERROUS SULFATE  PO, Take 2 tablets by mouth daily. gummy, Disp: , Rfl:    formoterol  (PERFOROMIST ) 20 MCG/2ML nebulizer solution, Take 2 mLs (20 mcg total) by nebulization 2 (two) times daily., Disp: 60 mL, Rfl: 11   ipratropium-albuterol  (DUONEB) 0.5-2.5 (3) MG/3ML SOLN, Take 3 mLs by nebulization every 6 (six) hours as needed., Disp: 360 mL, Rfl: 11   mupirocin  ointment (BACTROBAN ) 2 %, Apply 1 Application topically 2 (two) times daily. (Patient not taking: Reported on 10/18/2023), Disp: 22 g, Rfl: 0   pravastatin  (PRAVACHOL ) 40 MG tablet, TAKE 1 TABLET DAILY, Disp: 90 tablet, Rfl: 3   Spacer/Aero-Holding Chambers (AEROCHAMBER PLUS) inhaler, Use as instructed, Disp: 1 each, Rfl: 2   telmisartan  (MICARDIS ) 40 MG tablet, TAKE 1 TABLET DAILY, Disp: 90 tablet, Rfl: 3   triamcinolone  cream (KENALOG ) 0.1 %, Apply 1 Application topically 2 (two) times daily., Disp: 30 g, Rfl: 0   Allergies  Allergen Reactions   Bactrim [Sulfamethoxazole-Trimethoprim] Swelling    Marked angioedema requiring hospitalization   Penicillins Other (See Comments)    Has patient had a PCN reaction causing immediate rash, facial/tongue/throat swelling, SOB or lightheadedness with hypotension: Unk Has patient had a PCN reaction causing severe rash involving mucus membranes or skin necrosis: Unk Has patient had a PCN reaction that required hospitalization: Unk Has patient had a PCN reaction occurring within the last 10 years: No If all of the above answers are NO, then may proceed with Cephalosporin use.      The patient states she uses post menopausal status for birth control. No LMP recorded. Patient is postmenopausal.. Negative for Dysmenorrhea. Negative for: breast  discharge, breast lump(s), breast pain and breast self exam. Associated symptoms include abnormal vaginal bleeding. Pertinent negatives include abnormal bleeding (hematology), anxiety, decreased libido, depression, difficulty falling sleep, dyspareunia, history of infertility, nocturia, sexual dysfunction, sleep disturbances, urinary incontinence, urinary urgency, vaginal discharge and vaginal itching. Diet regular.The patient states her exercise level is  minimal.  . The patient's tobacco use is:  Social History   Tobacco Use  Smoking Status Former   Current packs/day: 0.00   Average packs/day: 0.5 packs/day for 71.0 years (35.5 ttl pk-yrs)   Types: Cigarettes   Start date: 03/08/1947   Quit date: 2020   Years since quitting: 5.6  Smokeless Tobacco Never  Tobacco Comments   has tried cold [place]  . She has been exposed to passive smoke. The patient's alcohol use is:  Social History   Substance and Sexual Activity  Alcohol Use No   Alcohol/week: 0.0 standard drinks of alcohol    Review of Systems  Constitutional: Negative.   HENT: Negative.  Eyes: Negative.  Negative for blurred vision.  Respiratory: Negative.    Cardiovascular: Negative.   Gastrointestinal: Negative.   Endocrine: Negative.   Genitourinary: Negative.   Musculoskeletal:  Positive for arthralgias.  Skin: Negative.   Allergic/Immunologic: Negative.   Neurological: Negative.   Hematological: Negative.   Psychiatric/Behavioral: Negative.       Today's Vitals   10/12/23 1101  BP: 120/72  Pulse: 80  Temp: 98.3 F (36.8 C)  SpO2: 98%  Weight: 164 lb 3.2 oz (74.5 kg)  Height: 5' 3 (1.6 m)   Body mass index is 29.09 kg/m.  Wt Readings from Last 3 Encounters:  10/12/23 164 lb 3.2 oz (74.5 kg)  09/15/23 166 lb 14.2 oz (75.7 kg)  06/13/23 166 lb 12.8 oz (75.7 kg)     Objective:  Physical Exam Vitals and nursing note reviewed.  Constitutional:      Appearance: Normal appearance.  HENT:     Head:  Normocephalic and atraumatic.     Right Ear: Ear canal and external ear normal. There is impacted cerumen.     Left Ear: Ear canal and external ear normal. There is impacted cerumen.     Nose: Nose normal.     Mouth/Throat:     Mouth: Mucous membranes are moist.     Pharynx: Oropharynx is clear.  Eyes:     Extraocular Movements: Extraocular movements intact.     Conjunctiva/sclera: Conjunctivae normal.     Pupils: Pupils are equal, round, and reactive to light.  Cardiovascular:     Rate and Rhythm: Normal rate and regular rhythm.     Pulses:          Dorsalis pedis pulses are 1+ on the right side and 1+ on the left side.       Posterior tibial pulses are 0 on the right side and 0 on the left side.     Heart sounds: Normal heart sounds.  Pulmonary:     Effort: Pulmonary effort is normal.     Comments: Decreased breath sounds at bases b/l Chest:  Breasts:    Tanner Score is 5.     Right: Absent.     Left: Normal.     Comments: Mastectomy on R Abdominal:     General: Abdomen is flat. Bowel sounds are normal.     Palpations: Abdomen is soft.  Genitourinary:    Comments: deferred Musculoskeletal:        General: Normal range of motion.     Cervical back: Normal range of motion and neck supple.  Feet:     Right foot:     Protective Sensation: 5 sites tested.  5 sites sensed.     Skin integrity: Callus and dry skin present.     Toenail Condition: Right toenails are abnormally thick and long.     Left foot:     Protective Sensation: 5 sites tested.  5 sites sensed.     Skin integrity: Callus and dry skin present.     Toenail Condition: Left toenails are abnormally thick and long.  Skin:    General: Skin is warm and dry.     Comments: Hyperpigmentation b/l ankles/feet, brawny skin Dry, scaly skin posterior neck, no vesicular lesions noted   Neurological:     General: No focal deficit present.     Mental Status: She is alert and oriented to person, place, and time.   Psychiatric:        Mood and Affect: Mood normal.  Behavior: Behavior normal.         Assessment And Plan:     Encounter for general adult medical examination w/o abnormal findings Assessment & Plan: A full exam was performed. She is encouraged to perform breast exam monthly.  PATIENT IS ADVISED TO GET 30-45 MINUTES REGULAR EXERCISE NO LESS THAN FOUR TO FIVE DAYS PER WEEK - BOTH WEIGHTBEARING EXERCISES AND AEROBIC ARE RECOMMENDED.  PATIENT IS ADVISED TO FOLLOW A HEALTHY DIET WITH AT LEAST SIX FRUITS/VEGGIES PER DAY, DECREASE INTAKE OF RED MEAT, AND TO INCREASE FISH INTAKE TO TWO DAYS PER WEEK.  MEATS/FISH SHOULD NOT BE FRIED, BAKED OR BROILED IS PREFERABLE.  IT IS ALSO IMPORTANT TO CUT BACK ON YOUR SUGAR INTAKE. PLEASE AVOID ANYTHING WITH ADDED SUGAR, CORN SYRUP OR OTHER SWEETENERS. IF YOU MUST USE A SWEETENER, YOU CAN TRY STEVIA. IT IS ALSO IMPORTANT TO AVOID ARTIFICIALLY SWEETENERS AND DIET BEVERAGES. LASTLY, I SUGGEST WEARING SPF 50 SUNSCREEN ON EXPOSED PARTS AND ESPECIALLY WHEN IN THE DIRECT SUNLIGHT FOR AN EXTENDED PERIOD OF TIME.  PLEASE AVOID FAST FOOD RESTAURANTS AND INCREASE YOUR WATER INTAKE.    Type 2 diabetes mellitus with stage 3a chronic kidney disease, without long-term current use of insulin  (HCC) Assessment & Plan: Chronic, she is not on any meds at this time. Diabetic foot exam was performed. She agrees to take meds if A1c goes above 7.0. I will check labs as below. Regarding her renal function, encouraged to avoid NSAIDs, stay hydrated and to keep BP well controlled to decrease risk of CKD progression.   Orders: -     POCT URINALYSIS DIP (CLINITEK) -     Microalbumin / creatinine urine ratio -     EKG 12-Lead -     CMP14+EGFR -     Lipid panel -     Hemoglobin A1c -     PTH, intact and calcium  -     Phosphorus -     Protein electrophoresis, serum  Hypertensive heart and chronic kidney disease with heart failure and stage 1 through stage 4 chronic kidney  disease, or unspecified chronic kidney disease (HCC) Assessment & Plan: Chronic, controlled.  EKG performed, NSR w/o acute changges.  She will continue with telmisartan  40mg  daily. She is reminded to follow a low sodium diet. She will f/u in four to six months for re-evaluation.   Orders: -     POCT URINALYSIS DIP (CLINITEK) -     Microalbumin / creatinine urine ratio -     EKG 12-Lead  Aortic atherosclerosis (HCC) Assessment & Plan: Chronic, goal LDL<70.  Importance of following heart healthy lifestyle and statin compliance was stressed to the patient. She will continue with ASA 81mg  and pravastatin  40mg  daily.      COPD with chronic bronchitis and emphysema (HCC) Assessment & Plan: COPD, sx are stable. She denies having any worsening sob. Pulmonary input appreciated, she has had PFTs. She use nebulizer for symptoms, no new issues. No recent exacerbations.  - COPD management reviewed. Continues Pulmicort  inhaler as prescribed. - Continue Pulmicort  inhaler as prescribed.   PAD (peripheral artery disease), abnormal ABIs Assessment & Plan: Suspected peripheral vascular disease with foot pain and neuropathic symptoms. New Doppler ultrasound needed to assess circulation. Pain possibly neuropathic. - Order Doppler ultrasound of lower extremities to assess circulation. - Consider gabapentin  for neuropathic pain if symptoms persist.  Orders: -     VAS US  LOWER EXTREMITY ARTERIAL DUPLEX; Future  Bilateral impacted cerumen Assessment & Plan: After obtaining verbal  consent, both ears were flushed by irrigation. No TM abnormalities were noted. He tolerated procedure well without any complications.     Orders: -     Ear Lavage  Cancer of upper lobe of left lung [facility]) Assessment & Plan: She is s/p SBRT May 2024.  CT of the chest on 10/13/22 showed interval decrease in the size of the LUL pulmonary lesion with post treatment scarring and interval decrease in size of the subcarinal lymph  node.  - currently under active surveillance - She is scheduled to have yearly scans.    Other orders -     Diclofenac  Sodium; Apply 4 g topically 4 (four) times daily. prn  Dispense: 150 g; Refill: 1   Return in 6 months (on 04/13/2024), or dm check, for 1 year physical. Patient was given opportunity to ask questions. Patient verbalized understanding of the plan and was able to repeat key elements of the plan. All questions were answered to their satisfaction.   I, Leah LOISE Slocumb, MD, have reviewed all documentation for this visit. The documentation on 10/12/23 for the exam, diagnosis, procedures, and orders are all accurate and complete.

## 2023-10-13 LAB — MICROALBUMIN / CREATININE URINE RATIO
Creatinine, Urine: 92.6 mg/dL
Microalb/Creat Ratio: 7 mg/g{creat} (ref 0–29)
Microalbumin, Urine: 6.2 ug/mL

## 2023-10-15 ENCOUNTER — Ambulatory Visit: Payer: Self-pay | Admitting: Internal Medicine

## 2023-10-16 ENCOUNTER — Ambulatory Visit (HOSPITAL_COMMUNITY)
Admission: RE | Admit: 2023-10-16 | Discharge: 2023-10-16 | Disposition: A | Discharge status: Home / Self Care | Source: Ambulatory Visit | Attending: Radiology | Admitting: Radiology

## 2023-10-16 DIAGNOSIS — R590 Localized enlarged lymph nodes: Secondary | ICD-10-CM | POA: Diagnosis not present

## 2023-10-16 DIAGNOSIS — C3412 Malignant neoplasm of upper lobe, left bronchus or lung: Secondary | ICD-10-CM | POA: Insufficient documentation

## 2023-10-16 DIAGNOSIS — I700 Atherosclerosis of aorta: Secondary | ICD-10-CM | POA: Diagnosis not present

## 2023-10-16 DIAGNOSIS — C3490 Malignant neoplasm of unspecified part of unspecified bronchus or lung: Secondary | ICD-10-CM | POA: Diagnosis not present

## 2023-10-16 DIAGNOSIS — J984 Other disorders of lung: Secondary | ICD-10-CM | POA: Diagnosis not present

## 2023-10-16 LAB — LIPID PANEL
Chol/HDL Ratio: 2.3 ratio (ref 0.0–4.4)
Cholesterol, Total: 199 mg/dL (ref 100–199)
HDL: 87 mg/dL (ref >39)
LDL Chol Calc (NIH): 104 mg/dL — ABNORMAL HIGH (ref 0–99)
Triglycerides: 43 mg/dL (ref 0–149)
VLDL Cholesterol Cal: 8 mg/dL (ref 5–40)

## 2023-10-16 LAB — CMP14+EGFR
ALT: 18 IU/L (ref 0–32)
AST: 17 IU/L (ref 0–40)
Albumin: 4.1 g/dL (ref 3.6–4.6)
Alkaline Phosphatase: 86 IU/L (ref 44–121)
BUN/Creatinine Ratio: 21 (ref 12–28)
BUN: 21 mg/dL (ref 10–36)
Bilirubin Total: 0.3 mg/dL (ref 0.0–1.2)
CO2: 25 mmol/L (ref 20–29)
Calcium: 9.3 mg/dL (ref 8.7–10.3)
Chloride: 102 mmol/L (ref 96–106)
Creatinine, Ser: 1.01 mg/dL — ABNORMAL HIGH (ref 0.57–1.00)
Globulin, Total: 2.6 g/dL (ref 1.5–4.5)
Glucose: 93 mg/dL (ref 70–99)
Potassium: 4.8 mmol/L (ref 3.5–5.2)
Sodium: 142 mmol/L (ref 134–144)
Total Protein: 6.7 g/dL (ref 6.0–8.5)
eGFR: 52 mL/min/1.73 — ABNORMAL LOW (ref >59)

## 2023-10-16 LAB — PTH, INTACT AND CALCIUM: PTH: 39 pg/mL (ref 15–65)

## 2023-10-16 LAB — HEMOGLOBIN A1C
Est. average glucose Bld gHb Est-mCnc: 148 mg/dL
Hgb A1c MFr Bld: 6.8 % — ABNORMAL HIGH (ref 4.8–5.6)

## 2023-10-16 LAB — PROTEIN ELECTROPHORESIS, SERUM
A/G Ratio: 1 (ref 0.7–1.7)
Albumin ELP: 3.4 g/dL (ref 2.9–4.4)
Alpha 1: 0.2 g/dL (ref 0.0–0.4)
Alpha 2: 1 g/dL (ref 0.4–1.0)
Beta: 1.1 g/dL (ref 0.7–1.3)
Gamma Globulin: 0.9 g/dL (ref 0.4–1.8)
Globulin, Total: 3.3 g/dL (ref 2.2–3.9)

## 2023-10-16 LAB — PHOSPHORUS: Phosphorus: 3.6 mg/dL (ref 3.0–4.3)

## 2023-10-17 ENCOUNTER — Other Ambulatory Visit (HOSPITAL_COMMUNITY): Payer: Self-pay | Admitting: Internal Medicine

## 2023-10-17 ENCOUNTER — Ambulatory Visit (HOSPITAL_BASED_OUTPATIENT_CLINIC_OR_DEPARTMENT_OTHER)
Admission: RE | Admit: 2023-10-17 | Discharge: 2023-10-17 | Disposition: A | Discharge status: Home / Self Care | Source: Ambulatory Visit | Attending: Vascular Surgery | Admitting: Vascular Surgery

## 2023-10-17 ENCOUNTER — Ambulatory Visit (HOSPITAL_COMMUNITY)
Admission: RE | Admit: 2023-10-17 | Discharge: 2023-10-17 | Disposition: A | Discharge status: Home / Self Care | Source: Ambulatory Visit | Attending: Internal Medicine | Admitting: Internal Medicine

## 2023-10-17 DIAGNOSIS — I739 Peripheral vascular disease, unspecified: Secondary | ICD-10-CM | POA: Diagnosis not present

## 2023-10-17 NOTE — Progress Notes (Incomplete)
 Radiation Oncology         [phone] ________________________________  Name: Leah Wiggins MRN: [id number]  Date: 10/19/2023  DOB: [birth year]  Follow-Up Visit Note  CC: Jarold Medici, MD  Jarold Medici, MD  No diagnosis found.  Diagnosis:  Enlarging left upper lobe pulmonary nodule concerning for malignancy (non-biopsy proven); s/p SBRT   Interval Since Last Radiation:  1 year 3 months  7 days  First Treatment Date: 2022-07-01 - Last Treatment Date: 2022-07-11  Plan Name: Lung_Lt_SBRT Site: Lung, Left Technique: SBRT/SRT-IMRT Mode: Photon Dose Per Fraction: 12 Gy Prescribed Dose (Delivered / Prescribed): 60 Gy / 60 Gy Prescribed Fxs (Delivered / Prescribed): 5 / 5  Narrative:  The patient returns today for routine follow-up and to review most recent imaging. She was last seen in office on 10/19/22 for a follow up visit with Dr. Izell. Patient continued to follow up with their specialists to manage their chronic conditions.                              In the interval since she was last seen, patient was seen in the ED on multiple occasions. Her first ED visit was on 02/10/23 where she complained of left-sided breast pain and intermittent SOB. She then underwent a CT angio and chest x-ray which were unremarkable for any significant findings. Left upper lobe mass was stable at that time. Later on she underwent  a bilateral screening mammogram on 03/10/23 that was negative for any malignancy.   She returned to the UC on 09/15/23 complaining of left-sided chest pain that radiates to  her right chest along with left arm pain and SOB. Although workup was unremarkable she was transferred to the ED due her complex medical history.  A chest x-ray at that time did not indicate any new findings.   Most recent CT chest done on 10/16/23 shows the left upper lobe pulmonary lesion with a associated linear banded opacities architectural distortion and volume loss measures 18 x 10 mm compared  to 2.1 x 1.5 cm previously. No new suspicious pulmonary nodules or masses.     No other significant oncologic interval history since the patient was last seen.    Allergies:  is allergic to bactrim [sulfamethoxazole-trimethoprim] and penicillins.  Meds: Current Outpatient Medications  Medication Sig Dispense Refill   alendronate  (FOSAMAX ) 70 MG tablet TAKE 1 TABLET EVERY WEDNESDAY WITH A FULL GLASS OF WATER ON AN EMPTY STOMACH 12 tablet 3   ARTIFICIAL TEAR OP Place 1 drop into both eyes 2 (two) times daily as needed (dry eyes).     aspirin  EC 81 MG tablet Take 81 mg by mouth daily.     Blood Glucose Monitoring Suppl (FREESTYLE FREEDOM LITE) w/Device KIT Use to check blood sugar 3 times a day. Dx code e11.65 1 kit 3   budesonide  (PULMICORT ) 0.25 MG/2ML nebulizer solution Take 2 mLs (0.25 mg total) by nebulization in the morning and at bedtime. 60 mL 11   diclofenac  Sodium (VOLTAREN ) 1 % GEL Apply 4 g topically 4 (four) times daily. prn 150 g 1   FERROUS SULFATE  PO Take 2 tablets by mouth daily. gummy     formoterol  (PERFOROMIST ) 20 MCG/2ML nebulizer solution Take 2 mLs (20 mcg total) by nebulization 2 (two) times daily. 60 mL 11   ipratropium-albuterol  (DUONEB) 0.5-2.5 (3) MG/3ML SOLN Take 3 mLs by nebulization every 6 (six) hours as needed. 360 mL 11  mupirocin  ointment (BACTROBAN ) 2 % Apply 1 Application topically 2 (two) times daily. 22 g 0   pravastatin  (PRAVACHOL ) 40 MG tablet TAKE 1 TABLET DAILY 90 tablet 3   Spacer/Aero-Holding Chambers (AEROCHAMBER PLUS) inhaler Use as instructed 1 each 2   telmisartan  (MICARDIS ) 40 MG tablet TAKE 1 TABLET DAILY 90 tablet 3   triamcinolone  cream (KENALOG ) 0.1 % Apply 1 Application topically 2 (two) times daily. 30 g 0   No current facility-administered medications for this encounter.    Physical Findings: The patient is in no acute distress. Patient is alert and oriented.  vitals were not taken for this visit. .  No significant changes. Lungs  are clear to auscultation bilaterally. Heart has regular rate and rhythm. No palpable cervical, supraclavicular, or axillary adenopathy. Abdomen soft, non-tender, normal bowel sounds.   Lab Findings: Lab Results  Component Value Date   WBC 5.4 09/15/2023   HGB 11.6 (L) 09/15/2023   HCT 36.5 09/15/2023   MCV 78.2 (L) 09/15/2023   PLT PLATELET CLUMPS NOTED ON SMEAR, UNABLE TO ESTIMATE 09/15/2023    Radiographic Findings: CT Chest Wo Contrast Result Date: 10/16/2023 CLINICAL DATA:  Non-small cell lung cancer, monitor. * Tracking Code: BO * EXAM: CT CHEST WITHOUT CONTRAST TECHNIQUE: Multidetector CT imaging of the chest was performed following the standard protocol without IV contrast. RADIATION DOSE REDUCTION: This exam was performed according to the departmental dose-optimization program which includes automated exposure control, adjustment of the mA and/or kV according to patient size and/or use of iterative reconstruction technique. COMPARISON:  Multiple priors including CT October 13, 2022 FINDINGS: Cardiovascular: Aortic atherosclerosis. Coronary artery calcifications. Normal size heart. No significant pericardial effusion/thickening. Mediastinum/Nodes: No suspicious thyroid  nodule. No pathologically enlarged mediastinal, hilar or axillary lymph nodes. Subcarinal lymph node measures 5 mm in short axis on image 66/2 previously 8 mm. The esophagus is grossly unremarkable. Lungs/Pleura: Left upper lobe pulmonary lesion with a associated linear banded opacities architectural distortion and volume loss measures 18 x 10 mm on image 34/5 previously 2.1 x 1.5 cm when remeasured for consistency. No new suspicious pulmonary nodules or masses. Scattered atelectasis/scarring. Osteophyte related scarring in the paramedian right lower lobe. Upper Abdomen: No suspicious adrenal nodule/mass. Musculoskeletal: No aggressive lytic or blastic lesion of bone. Multilevel degenerative changes spine. Severe degenerative  change of the bilateral shoulders. Subacute minimally displaced left lateral third and fourth rib fractures common no discrete underlying lesion to suggest pathologic fracture. IMPRESSION: 1. Interval decrease in size of the left upper lobe pulmonary lesion with evolving postradiation change. 2. Decreased size of the subcarinal lymph node. 3. No evidence of new or progressive disease in the chest. 4. Subacute minimally displaced left lateral third and fourth rib fractures, no discrete underlying lesion to suggest pathologic fracture. 5. Aortic atherosclerosis. Aortic Atherosclerosis (ICD10-I70.0). Electronically Signed   By: Reyes Holder M.D.   On: 10/16/2023 14:45    Impression: Enlarging left upper lobe pulmonary nodule concerning for malignancy (non-biopsy proven); s/p SBRT  The patient is recovering from the effects of radiation.  ***  Plan:  ***   *** minutes of total time was spent for this patient encounter, including preparation, face-to-face counseling with the patient and coordination of care, physical exam, and documentation of the encounter. ____________________________________  Lynwood CHARM Nasuti, PhD, MD  This document serves as a record of services personally performed by Lynwood Nasuti, MD. It was created on his behalf by Reymundo Cartwright, a trained medical scribe. The creation of this record is based  on the scribe's personal observations and the provider's statements to them. This document has been checked and approved by the attending provider.

## 2023-10-18 ENCOUNTER — Ambulatory Visit
Admission: RE | Admit: 2023-10-18 | Discharge: 2023-10-18 | Disposition: A | Discharge status: Home / Self Care | Source: Ambulatory Visit | Attending: Radiology | Admitting: Radiology

## 2023-10-18 ENCOUNTER — Encounter: Payer: Self-pay | Admitting: Radiology

## 2023-10-18 DIAGNOSIS — C3412 Malignant neoplasm of upper lobe, left bronchus or lung: Secondary | ICD-10-CM

## 2023-10-18 DIAGNOSIS — Z87891 Personal history of nicotine dependence: Secondary | ICD-10-CM | POA: Diagnosis not present

## 2023-10-18 LAB — VAS US ABI WITH/WO TBI
Left ABI: 0.62
Right ABI: 0.58

## 2023-10-18 NOTE — Progress Notes (Signed)
 Radiation Oncology         [phone] ________________________________  Name: Leah Wiggins MRN: [id number]  Date: 10/18/2023  DOB: [birth year]  Follow-Up Visit Note - Conducted via telephone at patient request.   I spoke with the patient to conduct this consult visit via telephone. The patient was notified in advance and was offered an in person or telemedicine meeting to allow for face to face communication but instead preferred to proceed with a telephone follow-up.    CC: Jarold Medici, MD  Jarold Medici, MD  Diagnosis and Prior Radiotherapy:       ICD-10-CM   1. 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 She completed her radiation treatment on 07/11/22. She most recently had a CT of the chest on 10/16/22. She is here to review her images.   CT of the chest on 10/13/22 showed interval decrease in the size of the LUL pulmonary lesion with evolving postradiation change; a subcarinal lymph node to have decreased in size; no evidence of new or progressive disease was noted in the chest; and subacute minimally displaced left lateral third and fourth rib fractures, no discrete underlying lesion to suggest pathologic fracture.   Does the patient complain of any of the following: Pain: Left shoulder pain 7/10 Shortness of breath w/wo exertion: Denies Cough: Denies Hemoptysis: Denies Pain with swallowing: Denies Swallowing/choking concerns:  Denies Appetite: Good Energy Level: Good Post radiation skin Changes: Denies   ALLERGIES:  is allergic to bactrim [sulfamethoxazole-trimethoprim] and penicillins.  Meds: Current Outpatient Medications  Medication Sig Dispense Refill   alendronate  (FOSAMAX ) 70 MG tablet TAKE 1 TABLET EVERY WEDNESDAY WITH A FULL GLASS OF WATER ON AN EMPTY STOMACH 12 tablet 3   ARTIFICIAL TEAR OP Place 1 drop into both eyes 2 (two) times daily as needed (dry eyes).     aspirin  EC 81 MG tablet Take 81 mg by mouth daily.     Blood Glucose Monitoring Suppl (FREESTYLE FREEDOM LITE) w/Device KIT Use to check blood sugar 3 times a day. Dx code e11.65 1 kit 3   budesonide  (PULMICORT ) 0.25 MG/2ML nebulizer solution Take 2 mLs (0.25 mg total) by nebulization in the morning and at bedtime. 60 mL 11   diclofenac  Sodium (VOLTAREN ) 1 % GEL Apply 4 g topically 4 (four) times daily. prn 150 g 1   FERROUS SULFATE  PO Take 2 tablets by mouth daily. gummy     formoterol  (PERFOROMIST ) 20 MCG/2ML nebulizer solution Take 2 mLs (20 mcg total) by nebulization 2 (two) times daily. 60 mL 11   ipratropium-albuterol  (DUONEB) 0.5-2.5 (3) MG/3ML SOLN Take 3 mLs by nebulization every 6 (six) hours as needed. 360 mL 11   pravastatin  (PRAVACHOL ) 40 MG tablet TAKE 1 TABLET DAILY 90 tablet 3   Spacer/Aero-Holding Chambers (AEROCHAMBER PLUS) inhaler Use as instructed 1 each 2   telmisartan  (MICARDIS ) 40 MG tablet TAKE 1 TABLET DAILY 90 tablet 3   triamcinolone  cream (KENALOG ) 0.1 % Apply 1 Application  topically 2 (two) times daily. 30 g 0   mupirocin  ointment (BACTROBAN ) 2 % Apply 1 Application topically 2 (two) times daily. (Patient not taking: Reported on 10/18/2023) 22 g 0   No current facility-administered medications for this encounter.    Physical Findings: The patient is in no acute distress. Patient is alert and oriented. Deferred, due to nature of the telephone visit.      Lab Findings: Lab Results  Component Value Date    WBC 5.4 09/15/2023   HGB 11.6 (L) 09/15/2023   HCT 36.5 09/15/2023   MCV 78.2 (L) 09/15/2023   PLT PLATELET CLUMPS NOTED ON SMEAR, UNABLE TO ESTIMATE 09/15/2023    Lab Results  Component Value Date   TSH 1.365 03/09/2023    Radiographic Findings: VAS US  ABI WITH/WO TBI Result Date: 10/17/2023  LOWER EXTREMITY DOPPLER STUDY Patient Name:  Leah Wiggins  Date of Exam:   10/17/2023 Medical Rec #: [id number]            Accession #:    [id number] Date of Birth: 02-Sep-1926            Patient Gender: F Patient Age:   89 years Exam Location:  [address] Procedure:      VAS US  ABI WITH/WO TBI Referring Phys: CATHERYN SLOCUMB --------------------------------------------------------------------------------  Indications: Claudication. High Risk         Hypertension, hyperlipidemia, Diabetes, past history of Factors:          smoking.  Comparison Study: 08/27/18 Performing Technologist: Garnette Rockers  Examination Guidelines: A complete evaluation includes at minimum, Doppler waveform signals and systolic blood pressure reading at the level of bilateral brachial, anterior tibial, and posterior tibial arteries, when vessel segments are accessible. Bilateral testing is considered an integral part of a complete examination. Photoelectric Plethysmograph (PPG) waveforms and toe systolic pressure readings are included as required and additional duplex testing as needed. Limited examinations for reoccurring indications may be performed as noted.  ABI Findings: +---------+------------------+-----+----------+--------+ Right    Rt Pressure (mmHg)IndexWaveform  Comment  +---------+------------------+-----+----------+--------+ PTA      0                 0.00 absent             +---------+------------------+-----+----------+--------+ DP       87                0.58 monophasic         +---------+------------------+-----+----------+--------+ Great Toe62                0.41 Normal              +---------+------------------+-----+----------+--------+ +---------+------------------+-----+-------------------+-------+ Left     Lt Pressure (mmHg)IndexWaveform           Comment +---------+------------------+-----+-------------------+-------+ Brachial 150                                               +---------+------------------+-----+-------------------+-------+ PTA      89                0.59 monophasic                 +---------+------------------+-----+-------------------+-------+ DP       93                0.62 dampened monophasic        +---------+------------------+-----+-------------------+-------+  Great Toe55                0.37 Normal                     +---------+------------------+-----+-------------------+-------+ +-------+-----------+-----------+------------+------------+ ABI/TBIToday's ABIToday's TBIPrevious ABIPrevious TBI +-------+-----------+-----------+------------+------------+ Right  0.58       0.41       0.76        0.57         +-------+-----------+-----------+------------+------------+ Left   0.62       0.37       0.88        0.59         +-------+-----------+-----------+------------+------------+  Bilateral ABIs and TBIs appear decreased compared to prior study on 08/26/21.  Summary: Right: Resting right ankle-brachial index indicates moderate right lower extremity arterial disease. The right toe-brachial index is abnormal. Left: Resting left ankle-brachial index indicates moderate left lower extremity arterial disease. The left toe-brachial index is abnormal. *See table(s) above for measurements and observations.     Preliminary    VAS US  LOWER EXTREMITY ARTERIAL DUPLEX Result Date: 10/17/2023 LOWER EXTREMITY ARTERIAL DUPLEX STUDY Patient Name:  Leah Wiggins  Date of Exam:   10/17/2023 Medical Rec #: [id number]            Accession #:    [id number] Date of Birth: 1926-09-30            Patient Gender: F Patient Age:   48 years Exam  Location:  [address] Procedure:      VAS US  LOWER EXTREMITY ARTERIAL DUPLEX Referring Phys: CATHERYN SLOCUMB --------------------------------------------------------------------------------  High Risk Factors: Hypertension, hyperlipidemia, Diabetes, past history of                    smoking.  Current ABI: 0.58/0.62 Comparison Study: 08/27/18 Performing Technologist: Garnette Rockers  Examination Guidelines: A complete evaluation includes B-mode imaging, spectral Doppler, color Doppler, and power Doppler as needed of all accessible portions of each vessel. Bilateral testing is considered an integral part of a complete examination. Limited examinations for reoccurring indications may be performed as noted.  +-----------+--------+-----+---------------+----------+--------+ RIGHT      PSV cm/sRatioStenosis       Waveform  Comments +-----------+--------+-----+---------------+----------+--------+ CFA Mid    93                          biphasic           +-----------+--------+-----+---------------+----------+--------+ CFA Distal 138          30-49% stenosisbiphasic           +-----------+--------+-----+---------------+----------+--------+ DFA        123                         biphasic           +-----------+--------+-----+---------------+----------+--------+ SFA Prox   101                         biphasic           +-----------+--------+-----+---------------+----------+--------+ SFA Mid    74                          biphasic           +-----------+--------+-----+---------------+----------+--------+ SFA Distal 218          50-74% stenosisbiphasic           +-----------+--------+-----+---------------+----------+--------+  POP Prox   77                          monophasic         +-----------+--------+-----+---------------+----------+--------+ POP Distal 72                          monophasic         +-----------+--------+-----+---------------+----------+--------+  ATA Distal 60                          monophasic         +-----------+--------+-----+---------------+----------+--------+ PTA Prox                occluded                          +-----------+--------+-----+---------------+----------+--------+ PTA Mid                 occluded                          +-----------+--------+-----+---------------+----------+--------+ PTA Distal              occluded                          +-----------+--------+-----+---------------+----------+--------+ PERO Distal12                          monophasic         +-----------+--------+-----+---------------+----------+--------+  +-----------+--------+-----+---------------+----------+--------+ LEFT       PSV cm/sRatioStenosis       Waveform  Comments +-----------+--------+-----+---------------+----------+--------+ CFA Distal 101                         biphasic           +-----------+--------+-----+---------------+----------+--------+ DFA        60                          biphasic           +-----------+--------+-----+---------------+----------+--------+ SFA Prox   195          30-49% stenosisbiphasic           +-----------+--------+-----+---------------+----------+--------+ SFA Mid    87                          biphasic           +-----------+--------+-----+---------------+----------+--------+ SFA Distal 124                         biphasic           +-----------+--------+-----+---------------+----------+--------+ POP Prox   69                          biphasic           +-----------+--------+-----+---------------+----------+--------+ POP Distal 48                          biphasic           +-----------+--------+-----+---------------+----------+--------+ ATA Distal 88  monophasic         +-----------+--------+-----+---------------+----------+--------+ PTA Prox                occluded                           +-----------+--------+-----+---------------+----------+--------+ PTA Distal              occluded                          +-----------+--------+-----+---------------+----------+--------+ PERO Distal28                          biphasic           +-----------+--------+-----+---------------+----------+--------+   Summary: Right: 30-49% stenosis noted in the common femoral artery. 50-74% stenosis noted in the superficial femoral artery and/or popliteal artery. Total occlusion noted in the posterior tibial artery. Left: 30-49% stenosis noted in the superficial femoral artery. Total occlusion noted in the posterior tibial artery.  See table(s) above for measurements and observations.    Preliminary    CT Chest Wo Contrast Result Date: 10/16/2023 CLINICAL DATA:  Non-small cell lung cancer, monitor. * Tracking Code: BO * EXAM: CT CHEST WITHOUT CONTRAST TECHNIQUE: Multidetector CT imaging of the chest was performed following the standard protocol without IV contrast. RADIATION DOSE REDUCTION: This exam was performed according to the departmental dose-optimization program which includes automated exposure control, adjustment of the mA and/or kV according to patient size and/or use of iterative reconstruction technique. COMPARISON:  Multiple priors including CT October 13, 2022 FINDINGS: Cardiovascular: Aortic atherosclerosis. Coronary artery calcifications. Normal size heart. No significant pericardial effusion/thickening. Mediastinum/Nodes: No suspicious thyroid  nodule. No pathologically enlarged mediastinal, hilar or axillary lymph nodes. Subcarinal lymph node measures 5 mm in short axis on image 66/2 previously 8 mm. The esophagus is grossly unremarkable. Lungs/Pleura: Left upper lobe pulmonary lesion with a associated linear banded opacities architectural distortion and volume loss measures 18 x 10 mm on image 34/5 previously 2.1 x 1.5 cm when remeasured for consistency. No new suspicious pulmonary  nodules or masses. Scattered atelectasis/scarring. Osteophyte related scarring in the paramedian right lower lobe. Upper Abdomen: No suspicious adrenal nodule/mass. Musculoskeletal: No aggressive lytic or blastic lesion of bone. Multilevel degenerative changes spine. Severe degenerative change of the bilateral shoulders. Subacute minimally displaced left lateral third and fourth rib fractures common no discrete underlying lesion to suggest pathologic fracture. IMPRESSION: 1. Interval decrease in size of the left upper lobe pulmonary lesion with evolving postradiation change. 2. Decreased size of the subcarinal lymph node. 3. No evidence of new or progressive disease in the chest. 4. Subacute minimally displaced left lateral third and fourth rib fractures, no discrete underlying lesion to suggest pathologic fracture. 5. Aortic atherosclerosis. Aortic Atherosclerosis (ICD10-I70.0). Electronically Signed   By: Reyes Holder M.D.   On: 10/16/2023 14:45    Impression/Plan:  Putative Stage 1 NSCLC treated with SBRT completed on 07/11/2022  Recent imaging again shows good response to therapy and subacute minimally displaced left lateral third and fourth rib fractures, but no discrete underlying lesion to suggest pathologic Fracture. We personally reviewed the images with the patient's daughter today. She states her mom is doing well overall today.   We discussed her options for follow-up at this point. Considering great response, age, and other health concerns, one approach is to get imaging on an as-needed basis, only repeating scans if  symptoms develop. Alternatively, we could start regular follow-up with annual CT scans. Patient and her daughter would like to proceed with routine follow-up at this time. CT of the chest ordered in one year with an appointment 3-4 days afterwards to review the scan. She understands to call with any questions or concerns in the meantime.    This encounter was conducted via  telephone.  The patient has provided two factor identification and has given verbal consent for this type of encounter and has been advised to only accept a meeting of this type in a secure network environment.  The time spent during this encounter was 20 minutes including preparation, discussion, and coordination of the patient's care  The attendants for this meeting include Leeroy Due PA-C, the patient, and family members. During the encounter Leeroy Due PA-C was located at [facility] Radiation Oncology Department.  Patient and family were located at home. _____________________________________   Leeroy Due, PA-C

## 2023-10-18 NOTE — Progress Notes (Signed)
 Leah Wiggins is here today for follow up post radiation to the lung. Diagnosis: C34.12 Malignant neoplasm of upper lobe, left bronchus or lung  Lung Side: Left side, she completed radiation treatment on 07/10/2022  Does the patient complain of any of the following: Pain: Left shoulder pain 7/10 Shortness of breath w/wo exertion: Denies Cough: Denies Hemoptysis: Denies Pain with swallowing: Denies Swallowing/choking concerns: Denies Appetite: Good Energy Level: Good Post radiation skin Changes: Denies    Additional comments if applicable: No vitals were taken for this visit.

## 2023-10-19 ENCOUNTER — Ambulatory Visit
Admission: RE | Admit: 2023-10-19 | Discharge: 2023-10-19 | Disposition: A | Discharge status: Home / Self Care | Payer: Self-pay | Source: Ambulatory Visit | Attending: Radiation Oncology | Admitting: Radiation Oncology

## 2023-10-20 ENCOUNTER — Ambulatory Visit: Payer: Self-pay | Admitting: Internal Medicine

## 2023-10-22 DIAGNOSIS — H6123 Impacted cerumen, bilateral: Secondary | ICD-10-CM | POA: Insufficient documentation

## 2023-10-22 NOTE — Assessment & Plan Note (Signed)
 Chronic, controlled.  EKG performed, NSR w/o acute changges.  She will continue with telmisartan  40mg  daily. She is reminded to follow a low sodium diet. She will f/u in four to six months for re-evaluation.

## 2023-10-22 NOTE — Assessment & Plan Note (Signed)
 She is s/p SBRT May 2024.  CT of the chest on 10/13/22 showed interval decrease in the size of the LUL pulmonary lesion with post treatment scarring and interval decrease in size of the subcarinal lymph node.  - currently under active surveillance - She is scheduled to have yearly scans.

## 2023-10-22 NOTE — Assessment & Plan Note (Signed)
Chronic, goal LDL<70.  Importance of following heart healthy lifestyle and statin compliance was stressed to the patient. She will continue with ASA 81mg  and pravastatin 40mg  daily.

## 2023-10-22 NOTE — Assessment & Plan Note (Signed)
 Suspected peripheral vascular disease with foot pain and neuropathic symptoms. New Doppler ultrasound needed to assess circulation. Pain possibly neuropathic. - Order Doppler ultrasound of lower extremities to assess circulation. - Consider gabapentin  for neuropathic pain if symptoms persist.

## 2023-10-22 NOTE — Assessment & Plan Note (Signed)
 Chronic, she is not on any meds at this time. Diabetic foot exam was performed. She agrees to take meds if A1c goes above 7.0. I will check labs as below. Regarding her renal function, encouraged to avoid NSAIDs, stay hydrated and to keep BP well controlled to decrease risk of CKD progression.

## 2023-10-22 NOTE — Assessment & Plan Note (Signed)
 COPD, sx are stable. She denies having any worsening sob. Pulmonary input appreciated, she has had PFTs. She use nebulizer for symptoms, no new issues. No recent exacerbations.  - COPD management reviewed. Continues Pulmicort  inhaler as prescribed. - Continue Pulmicort  inhaler as prescribed.

## 2023-10-22 NOTE — Assessment & Plan Note (Signed)
A full exam was performed. She is encouraged to perform breast exam monthly.  PATIENT IS ADVISED TO GET 30-45 MINUTES REGULAR EXERCISE NO LESS THAN FOUR TO FIVE DAYS PER WEEK - BOTH WEIGHTBEARING EXERCISES AND AEROBIC ARE RECOMMENDED.  PATIENT IS ADVISED TO FOLLOW A HEALTHY DIET WITH AT LEAST SIX FRUITS/VEGGIES PER DAY, DECREASE INTAKE OF RED MEAT, AND TO INCREASE FISH INTAKE TO TWO DAYS PER WEEK.  MEATS/FISH SHOULD NOT BE FRIED, BAKED OR BROILED IS PREFERABLE.  IT IS ALSO IMPORTANT TO CUT BACK ON YOUR SUGAR INTAKE. PLEASE AVOID ANYTHING WITH ADDED SUGAR, CORN SYRUP OR OTHER SWEETENERS. IF YOU MUST USE A SWEETENER, YOU CAN TRY STEVIA. IT IS ALSO IMPORTANT TO AVOID ARTIFICIALLY SWEETENERS AND DIET BEVERAGES. LASTLY, I SUGGEST WEARING SPF 50 SUNSCREEN ON EXPOSED PARTS AND ESPECIALLY WHEN IN THE DIRECT SUNLIGHT FOR AN EXTENDED PERIOD OF TIME.  PLEASE AVOID FAST FOOD RESTAURANTS AND INCREASE YOUR WATER INTAKE.

## 2023-10-22 NOTE — Assessment & Plan Note (Signed)
 After obtaining verbal consent, both ears were flushed by irrigation. No TM abnormalities were noted. He tolerated procedure well without any complications.

## 2023-10-25 ENCOUNTER — Telehealth: Payer: Self-pay | Admitting: *Deleted

## 2023-10-25 NOTE — Telephone Encounter (Signed)
 CALLED PATIENT'S DAUGHTER- SHEILA GRIFFIN AND INFORMED OF CT FOR 10-16-24- ARRIVAL TIME- 12:45 PM @ [facility], NO RESTRICTIONS TO SCAN, PATIENT TO RECEIVE RESULTS FROM DR. KINARD ON 10-21-24  @ 11 AM, SPOKE WITH PATIENT'S DAUGHTER- SHEILA GRIFFIN AND SHE IS AWARE OF THESE APPTS. AND THE INSTRUCTIONS

## 2023-11-15 NOTE — Telephone Encounter (Signed)
 Patient daughter(Shelia Schlender) return call and has been made aware of chart notation. Caller inquiry if patient could be prescribed a medication for UTI. E2C2 Agent asked if patient was having any current symptoms.Patient daughter was unsure if patient has current symptoms but stated she will call back tomorrow after she verifies with the patient.

## 2023-12-07 ENCOUNTER — Encounter: Payer: Self-pay | Admitting: Oncology

## 2023-12-07 ENCOUNTER — Inpatient Hospital Stay: Attending: Oncology

## 2023-12-07 ENCOUNTER — Inpatient Hospital Stay (HOSPITAL_BASED_OUTPATIENT_CLINIC_OR_DEPARTMENT_OTHER): Admitting: Oncology

## 2023-12-07 DIAGNOSIS — D696 Thrombocytopenia, unspecified: Secondary | ICD-10-CM | POA: Diagnosis not present

## 2023-12-07 DIAGNOSIS — D500 Iron deficiency anemia secondary to blood loss (chronic): Secondary | ICD-10-CM | POA: Diagnosis not present

## 2023-12-07 DIAGNOSIS — R040 Epistaxis: Secondary | ICD-10-CM

## 2023-12-07 LAB — CBC WITH DIFFERENTIAL (CANCER CENTER ONLY)
Abs Immature Granulocytes: 0.06 K/uL (ref 0.00–0.07)
Basophils Absolute: 0 K/uL (ref 0.0–0.1)
Basophils Relative: 0 %
Eosinophils Absolute: 0.1 K/uL (ref 0.0–0.5)
Eosinophils Relative: 1 %
HCT: 34.7 % — ABNORMAL LOW (ref 36.0–46.0)
Hemoglobin: 11.4 g/dL — ABNORMAL LOW (ref 12.0–15.0)
Immature Granulocytes: 1 %
Lymphocytes Relative: 14 %
Lymphs Abs: 0.7 K/uL (ref 0.7–4.0)
MCH: 25 pg — ABNORMAL LOW (ref 26.0–34.0)
MCHC: 32.9 g/dL (ref 30.0–36.0)
MCV: 76.1 fL — ABNORMAL LOW (ref 80.0–100.0)
Monocytes Absolute: 0.5 K/uL (ref 0.1–1.0)
Monocytes Relative: 9 %
Neutro Abs: 3.9 K/uL (ref 1.7–7.7)
Neutrophils Relative %: 75 %
Platelet Count: UNDETERMINED K/uL (ref 150–400)
RBC: 4.56 MIL/uL (ref 3.87–5.11)
RDW: 18.8 % — ABNORMAL HIGH (ref 11.5–15.5)
WBC Count: 5.2 K/uL (ref 4.0–10.5)
nRBC: 0 % (ref 0.0–0.2)

## 2023-12-07 LAB — IRON AND IRON BINDING CAPACITY (CC-WL,HP ONLY)
Iron: 77 ug/dL (ref 28–170)
Saturation Ratios: 27 % (ref 10.4–31.8)
TIBC: 281 ug/dL (ref 250–450)
UIBC: 204 ug/dL (ref 148–442)

## 2023-12-07 LAB — FOLATE: Folate: 13.6 ng/mL (ref >5.9)

## 2023-12-07 LAB — CMP (CANCER CENTER ONLY)
ALT: 11 U/L (ref 0–44)
AST: 16 U/L (ref 15–41)
Albumin: 3.8 g/dL (ref 3.5–5.0)
Alkaline Phosphatase: 66 U/L (ref 38–126)
Anion gap: 6 (ref 5–15)
BUN: 16 mg/dL (ref 8–23)
CO2: 29 mmol/L (ref 22–32)
Calcium: 9.4 mg/dL (ref 8.9–10.3)
Chloride: 109 mmol/L (ref 98–111)
Creatinine: 0.96 mg/dL (ref 0.44–1.00)
GFR, Estimated: 54 mL/min — ABNORMAL LOW (ref >60)
Glucose, Bld: 177 mg/dL — ABNORMAL HIGH (ref 70–99)
Potassium: 4 mmol/L (ref 3.5–5.1)
Sodium: 144 mmol/L (ref 135–145)
Total Bilirubin: 0.5 mg/dL (ref 0.0–1.2)
Total Protein: 6.9 g/dL (ref 6.5–8.1)

## 2023-12-07 LAB — VITAMIN B12: Vitamin B-12: 937 pg/mL — ABNORMAL HIGH (ref 180–914)

## 2023-12-07 LAB — FERRITIN: Ferritin: 69 ng/mL (ref 11–307)

## 2023-12-07 LAB — WBC/PLT IN CITRATE

## 2023-12-07 NOTE — Assessment & Plan Note (Signed)
 Epistaxis is ongoing but managed with Afrin nasal spray and a humidifier to address nasal dryness, likely exacerbated by a hot, closed environment. - Continue Afrin nasal spray as needed - Use humidifier to maintain nasal moisture

## 2023-12-07 NOTE — Progress Notes (Signed)
 [facility]  HEMATOLOGY CLINIC PROGRESS NOTE  PATIENT NAME: Leah Wiggins   MR#: [id number] DOB: March 16, 1926  Patient Care Team: Jarold Medici, MD as PCP - General (Internal Medicine) Georgia Blonder, MD as Consulting Physician (Obstetrics and Gynecology) Meade Verdon RAMAN, MD as Consulting Physician (Pulmonary Disease)  Date of visit: 12/07/2023   ASSESSMENT & PLAN:   Leah Wiggins is a 89 y.o. lady with a past medical history of hypertension, dyslipidemia, OSA, GERD, type 2 diabetes mellitus, remote history of right breast cancer in 1991, left upper lobe lung nodule concerning for malignancy (not biopsy-proven), status post SBRT in April to May 2024, was referred to our service for evaluation of chronic thrombocytopenia.  There could be a component of pseudothrombocytopenia because of platelet clumping.  Thrombocytopenia Chronic low platelet count since 2011, with recent nosebleeds. No other bleeding symptoms. Not likely a bone marrow issue given stable white count and long-standing nature of thrombocytopenia. 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  On her initial consultation with us  on 03/13/2023, there was evidence of platelet clumping on CBCD.  In the future, we will obtain platelet count in citrated tube for further evaluation.   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     B12, folate, TSH, iron studies, LDH, PT, PTT were all within normal limits on 03/13/2023.  ANA negative.    -Advise use of humidifier and saline nose drops to minimize risk of nosebleeds.  Labs today revealed stable hemoglobin of 11.4.  White count normal with normal differential.  Platelet clumping noted again.  Overall platelet count seems adequate on peripheral smear review.  No additional hematological intervention needed currently.     She was advised to continue oral iron supplements once  daily.  Iron deficiency anemia due to chronic blood loss History of iron deficiency with ongoing oral iron supplementation.  Hemoglobin is stable at 11.4. -Iron levels are normal currently.  Continue oral iron supplementation. -We will consider IV iron infusion if levels are significantly low in the future.  Currently no indication for this.  Epistaxis Epistaxis is ongoing but managed with Afrin nasal spray and a humidifier to address nasal dryness, likely exacerbated by a hot, closed environment. - Continue Afrin nasal spray as needed - Use humidifier to maintain nasal moisture   I spent a total of 20 minutes during this encounter with the patient including review of chart and various tests results, discussions about plan of care and coordination of care plan.  I reviewed lab results and outside records for this visit and discussed relevant results with the patient. Diagnosis, plan of care and treatment options were also discussed in detail with the patient. Opportunity provided to ask questions and answers provided to her apparent satisfaction. Provided instructions to call our clinic with any problems, questions or concerns prior to return visit. I recommended to continue follow-up with PCP and sub-specialists. She verbalized understanding and agreed with the plan. No barriers to learning was detected.  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  History of Present Illness Leah Wiggins is a 89 year old female  who presents for follow-up of pseudo thrombocytopenia.  She experiences persistent pain in her left breast, described as feeling 'full of milk' and causing significant discomfort, especially when bending over or turning in bed. She recalls being informed after her last childbirth that surgery was not an option for this issue.  She has previously been told that her platelets appear low on lab tests due to clumping in the blood sample. She continues to take an iron supplement, which has maintained her iron levels within normal range. She was informed that her hemoglobin was 11.4 g/dL at the most recent visit.  She experiences frequent epistaxis, which are managed with Afrin nasal spray and a humidifier to address dryness in her home environment. The Afrin has been effective in controlling the bleeding.  She mentions a past incident where she fell on the steps at her sister's house, resulting in bruised ribs.  She resides in a home where the environment is described as hot with closed windows, contributing to nasal dryness.    SUMMARY OF HEMATOLOGIC HISTORY:  On review of records, patient has had chronic thrombocytopenia at least since 2011 with platelet count ranging anywhere between 48,000 (November 2024)-200,000.  Report of moderate thrombocytopenia with aggregation and potential platelet clumping on multiple occasions.  She was evaluated by at least 3 hematologists in the past, most recently by Dr. Donnajean in 2019.  Workup was indicative of iron deficiency state and she has been on iron supplementation chronically.  There was also a possibility of immune thrombocytopenia.   Recently her platelet count has been lower around 48,000 in November 2024 and 75,000 on 02/10/2023 and hence a referral was sent to us  again for further evaluation of thrombocytopenia.   The patient has been experiencing nosebleeds, which occurred twice on the same day. The patient denies any other bleeding problems. The patient  also has iron deficiency and is currently taking iron supplements. The patient's daughter mentioned that she gives the patient a liquid multivitamin and makes her fresh juices, including beet juice.  Chronic low platelet count since 2011, with recent nosebleeds. No other bleeding symptoms. Not likely a bone marrow issue given stable white count and long-standing nature of thrombocytopenia. 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   On her initial consultation with us  on 03/13/2023, there was evidence of platelet clumping on CBCD.  In the future, we will obtain platelet count in citrated tube for further evaluation.   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  Discussed other possible etiologies including immune thrombocytopenia, nutrient deficiency including iron deficiency.   B12, folate, TSH, iron studies, LDH, PT, PTT were all within normal limits on 03/13/2023.  ANA negative.    -Advise use of humidifier and saline nose drops to minimize risk of nosebleeds.   No additional hematological intervention needed currently.    She was advised to continue oral iron supplements once daily.  I have reviewed the past medical history, past surgical history, social history and family history with the patient and they are unchanged from previous note.  ALLERGIES: She is allergic to bactrim [sulfamethoxazole-trimethoprim] and penicillins.  MEDICATIONS:  Current Outpatient Medications  Medication Sig Dispense Refill   alendronate  (FOSAMAX ) 70 MG tablet TAKE 1 TABLET EVERY WEDNESDAY WITH A FULL GLASS OF WATER ON AN EMPTY STOMACH 12 tablet 3   ARTIFICIAL TEAR  OP Place 1 drop into both eyes 2 (two) times daily as needed (dry eyes).     aspirin  EC 81 MG tablet Take 81 mg by mouth daily.     Blood Glucose Monitoring Suppl (FREESTYLE FREEDOM LITE) w/Device KIT Use to check blood sugar 3 times a day. Dx code e11.65 1 kit 3    budesonide  (PULMICORT ) 0.25 MG/2ML nebulizer solution Take 2 mLs (0.25 mg total) by nebulization in the morning and at bedtime. 60 mL 11   diclofenac  Sodium (VOLTAREN ) 1 % GEL Apply 4 g topically 4 (four) times daily. prn 150 g 1   FERROUS SULFATE  PO Take 2 tablets by mouth daily. gummy     formoterol  (PERFOROMIST ) 20 MCG/2ML nebulizer solution Take 2 mLs (20 mcg total) by nebulization 2 (two) times daily. 60 mL 11   ipratropium-albuterol  (DUONEB) 0.5-2.5 (3) MG/3ML SOLN Take 3 mLs by nebulization every 6 (six) hours as needed. 360 mL 11   mupirocin  ointment (BACTROBAN ) 2 % Apply 1 Application topically 2 (two) times daily. 22 g 0   pravastatin  (PRAVACHOL ) 40 MG tablet TAKE 1 TABLET DAILY 90 tablet 3   Spacer/Aero-Holding Chambers (AEROCHAMBER PLUS) inhaler Use as instructed 1 each 2   telmisartan  (MICARDIS ) 40 MG tablet TAKE 1 TABLET DAILY 90 tablet 3   triamcinolone  cream (KENALOG ) 0.1 % Apply 1 Application topically 2 (two) times daily. 30 g 0   No current facility-administered medications for this visit.     REVIEW OF SYSTEMS:    Review of Systems - Oncology  All other pertinent systems were reviewed with the patient and are negative.  PHYSICAL EXAMINATION:   Onc Performance Status - 12/07/23 1049       ECOG Perf Status   ECOG Perf Status Ambulatory and capable of all selfcare but unable to carry out any work activities.  Up and about more than 50% of waking hours      KPS SCALE   KPS % SCORE Requires occasional assistance but is able to care for most needs           There were no vitals filed for this visit.  There were no vitals filed for this visit.   Physical Exam Constitutional:      General: She is not in acute distress.    Appearance: Normal appearance.  HENT:     Head: Normocephalic and atraumatic.  Eyes:     General: No scleral icterus.    Conjunctiva/sclera: Conjunctivae normal.  Cardiovascular:     Rate and Rhythm: Normal rate and regular rhythm.   Pulmonary:     Effort: Pulmonary effort is normal.  Abdominal:     General: There is no distension.  Neurological:     General: No focal deficit present.     Mental Status: She is alert.  Psychiatric:        Mood and Affect: Mood normal.        Behavior: Behavior normal.     LABORATORY DATA:   I have reviewed the data as listed.  Results for orders placed or performed in visit on 12/07/23  WBC/PLT in Citrate  Result Value Ref Range   WBC in Citrate NOT INDICATED 4.0 - 10.5 K/uL   Platelet CT in Citrate EDTA platelet count consistent with citrate. 150 - 400 K/uL  Folate  Result Value Ref Range   Folate 13.6 >5.9 ng/mL  Vitamin B12  Result Value Ref Range   Vitamin B-12 937 (H) 180 - 914 pg/mL  Iron and Iron Binding Capacity ([facility] only)  Result Value Ref Range   Iron 77 28 - 170 ug/dL   TIBC 718 749 - 549 ug/dL   Saturation Ratios 27 10.4 - 31.8 %   UIBC 204 148 - 442 ug/dL  Ferritin  Result Value Ref Range   Ferritin 69 11 - 307 ng/mL  CMP (Cancer Center only)  Result Value Ref Range   Sodium 144 135 - 145 mmol/L   Potassium 4.0 3.5 - 5.1 mmol/L   Chloride 109 98 - 111 mmol/L   CO2 29 22 - 32 mmol/L   Glucose, Bld 177 (H) 70 - 99 mg/dL   BUN 16 8 - 23 mg/dL   Creatinine 9.03 9.55 - 1.00 mg/dL   Calcium  9.4 8.9 - 10.3 mg/dL   Total Protein 6.9 6.5 - 8.1 g/dL   Albumin 3.8 3.5 - 5.0 g/dL   AST 16 15 - 41 U/L   ALT 11 0 - 44 U/L   Alkaline Phosphatase 66 38 - 126 U/L   Total Bilirubin 0.5 0.0 - 1.2 mg/dL   GFR, Estimated 54 (L) >60 mL/min   Anion gap 6 5 - 15  CBC with Differential (Cancer Center Only)  Result Value Ref Range   WBC Count 5.2 4.0 - 10.5 K/uL   RBC 4.56 3.87 - 5.11 MIL/uL   Hemoglobin 11.4 (L) 12.0 - 15.0 g/dL   HCT 65.2 (L) 63.9 - 53.9 %   MCV 76.1 (L) 80.0 - 100.0 fL   MCH 25.0 (L) 26.0 - 34.0 pg   MCHC 32.9 30.0 - 36.0 g/dL   RDW 81.1 (H) 88.4 - 84.4 %   Platelet Count PLATELET CLUMPS NOTED ON SMEAR, UNABLE TO ESTIMATE 150 - 400  K/uL   nRBC 0.0 0.0 - 0.2 %   Neutrophils Relative % 75 %   Neutro Abs 3.9 1.7 - 7.7 K/uL   Lymphocytes Relative 14 %   Lymphs Abs 0.7 0.7 - 4.0 K/uL   Monocytes Relative 9 %   Monocytes Absolute 0.5 0.1 - 1.0 K/uL   Eosinophils Relative 1 %   Eosinophils Absolute 0.1 0.0 - 0.5 K/uL   Basophils Relative 0 %   Basophils Absolute 0.0 0.0 - 0.1 K/uL   WBC Morphology MORPHOLOGY UNREMARKABLE    Smear Review See Note    Immature Granulocytes 1 %   Abs Immature Granulocytes 0.06 0.00 - 0.07 K/uL   Ovalocytes PRESENT      RADIOGRAPHIC STUDIES:  No recent pertinent imaging studies available to review.   Orders Placed This Encounter  Procedures   CBC with Differential (Cancer Center Only)    Standing Status:   Future    Expiration Date:   12/06/2024   WBC/PLT in Citrate    Standing Status:   Future    Expiration Date:   12/06/2024   Iron and Iron Binding Capacity ([facility] only)    Standing Status:   Future    Expiration Date:   12/06/2024   Ferritin    Standing Status:   Future    Expiration Date:   12/06/2024     Future Appointments  Date Time Provider Department Center  04/15/2024 10:40 AM Jarold Medici, MD [facility] [address]  06/12/2024  3:20 PM [facility]-ANNUAL WELLNESS VISIT [facility] [address]  10/14/2024 11:00 AM Jarold Medici, MD [facility] [address]  10/16/2024  2:00 PM [facility] 2 [facility] [place]  10/21/2024 11:00 AM Shannon Agent, MD [facility] None  12/06/2024 10:15 AM [facility]  LAB [facility] None  12/06/2024 10:45 AM Lillith Mcneff, [place], MD [facility] None    This document was completed utilizing speech recognition software. Grammatical errors, random word insertions, pronoun errors, and incomplete sentences are an occasional consequence of this system due to software limitations, ambient noise, and hardware issues. Any formal questions or concerns about the content, text or information contained within the body of this dictation should be directly addressed to the  provider for clarification.

## 2023-12-07 NOTE — Assessment & Plan Note (Addendum)
 History of iron deficiency with ongoing oral iron supplementation.  Hemoglobin is stable at 11.4. -Iron levels are normal currently.  Continue oral iron supplementation. -We will consider IV iron infusion if levels are significantly low in the future.  Currently no indication for this.

## 2023-12-07 NOTE — Assessment & Plan Note (Addendum)
 Chronic low platelet count since 2011, with recent nosebleeds. No other bleeding symptoms. Not likely a bone marrow issue given stable white count and long-standing nature of thrombocytopenia. 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  On her initial consultation with us  on 03/13/2023, there was evidence of platelet clumping on CBCD.  In the future, we will obtain platelet count in citrated tube for further evaluation.   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     B12, folate, TSH, iron studies, LDH, PT, PTT were all within normal limits on 03/13/2023.  ANA negative.    -Advise use of humidifier and saline nose drops to minimize risk of nosebleeds.  Labs today revealed stable hemoglobin of 11.4.  White count normal with normal differential.  Platelet clumping noted again.  Overall platelet count seems adequate on peripheral smear review.  No additional hematological intervention needed currently.     Leah Wiggins was advised to continue oral iron supplements once daily.

## 2024-02-21 ENCOUNTER — Other Ambulatory Visit: Payer: Self-pay | Admitting: Internal Medicine

## 2024-02-21 NOTE — Telephone Encounter (Unsigned)
 Copied from CRM #[id number]. Topic: Clinical - Medication Refill >> Feb 21, 2024  3:06 PM Donna BRAVO wrote:    [facility] states it on back order and do not know when it will be in.   Dorothe would like  the prescription sent to   [id number] - [place], [place] - [address] AT [facility] [address] [place] [address] Phone: [phone] Fax: [phone] Hours: Not open 24 hours    Medication: telmisartan  (MICARDIS ) 40 MG tablet   Has the patient contacted their pharmacy? Yes [facility] states it on back order and do not know when it will be in.    This is the patient's preferred pharmacy:   [facility] [id number] - [place], [place] - [address] AT [facility] OF [address] [place] [address] Phone: [phone] Fax: [phone]   Is this the correct pharmacy for this prescription? Yes If no, delete pharmacy and type the correct one.   Has the prescription been filled recently? Yes  Is the patient out of the medication? Yes  Has the patient been seen for an appointment in the last year OR does the patient have an upcoming appointment? No  Can we respond through MyChart? Yes  Agent: Please be advised that Rx refills may take up to 3 business days. We ask that you follow-up with your pharmacy.

## 2024-02-22 MED ORDER — TELMISARTAN 40 MG PO TABS: 40.0000 mg | ORAL_TABLET | Freq: Every day | ORAL | 3 refills | Status: AC

## 2024-03-04 ENCOUNTER — Ambulatory Visit (HOSPITAL_COMMUNITY): Admission: EM | Admit: 2024-03-04 | Discharge: 2024-03-04 | Disposition: A | Discharge status: Home / Self Care

## 2024-03-04 ENCOUNTER — Encounter (HOSPITAL_COMMUNITY): Payer: Self-pay | Admitting: Emergency Medicine

## 2024-03-04 DIAGNOSIS — M25512 Pain in left shoulder: Secondary | ICD-10-CM | POA: Diagnosis not present

## 2024-03-04 DIAGNOSIS — G8929 Other chronic pain: Secondary | ICD-10-CM

## 2024-03-04 MED ORDER — ACETAMINOPHEN 500 MG PO PACK: 1000.0000 mg | PACK | Freq: Three times a day (TID) | ORAL | 1 refills | Status: AC | PRN

## 2024-03-04 NOTE — ED Triage Notes (Signed)
 Pt c/o pain in left breast for several days. Pt put Voltaren  cream Her mammogram scheduled 03/11/24

## 2024-03-04 NOTE — ED Provider Notes (Signed)
 " [facility]  Note:  This document was prepared using Dragon voice recognition software and may include unintentional dictation errors.  MRN: [id number] DOB: [birth year]  Subjective:   Leah Wiggins is a 89 y.o. female presenting for evaluation of pain to the left axilla times several days.  Patient reports that she woke up this morning and had increased pain to the left axilla and shoulder.  Daughter reveals previous history of shoulder fracture and known degenerative changes to the shoulder joint.  Patient has been using Voltaren  gel with minimal improvement.  Patient has a mammogram scheduled for 03/11/2024.  Patient denies any significant pain with palpation of the ribs or breast tissue.  Current Medications[1]   Allergies[2]  Past Medical History:  Diagnosis Date   Arthritis    shoulders (05/04/2017)   Breast cancer, right breast (HCC) 1991   GERD (gastroesophageal reflux disease)    Hard of hearing    Hyperlipidemia    Hypertension    Memory loss    Non-stress test nonreactive, 04/24/12, normal 12/10/2012   OSA on CPAP    don't wear it all the time (05/04/2017)   PVD (peripheral vascular disease)    Tobacco use 12/10/2012   Type II diabetes mellitus (HCC)      Past Surgical History:  Procedure Laterality Date   APPENDECTOMY     BIOPSY  01/19/2018   Procedure: BIOPSY;  Surgeon: Rollin Dover, MD;  Location: [facility];  Service: Endoscopy;;   BREAST BIOPSY Right 1991   CARDIOVASCULAR STRESS TEST  04/24/2012   No significant ST segment change suggestive of ischemia.   CATARACT EXTRACTION, BILATERAL Bilateral    COLONOSCOPY WITH PROPOFOL  N/A 01/19/2018   Procedure: COLONOSCOPY WITH PROPOFOL ;  Surgeon: Rollin Dover, MD;  Location: [facility];  Service: Endoscopy;  Laterality: N/A;   ESOPHAGOGASTRODUODENOSCOPY (EGD) WITH PROPOFOL  N/A 01/19/2018   Procedure: ESOPHAGOGASTRODUODENOSCOPY (EGD) WITH PROPOFOL ;  Surgeon: Rollin Dover, MD;  Location: [facility];  Service: Endoscopy;  Laterality: N/A;   JOINT REPLACEMENT     LOWER EXTREMITY ARTERIAL DOPPLER Bilateral 05/07/2012   Left ABI-demonstrated mild arterial insufficiency. Right CIA 50-69% diameter reduction. Right SFA less than 50% diameter reduction. Left SFA 70-99% diameter reduction. Bilateral Runoff-Posterior tibial arteries appeared occluded.   MASTECTOMY PARTIAL / LUMPECTOMY W/ AXILLARY LYMPHADENECTOMY Right 1991    TOTAL HIP ARTHROPLASTY Right 2011    Family History  Problem Relation Age of Onset   Stroke Mother    Cancer Sister     Social History[3]  ROS Refer to HPI for ROS details.  Objective:    Vitals: BP 129/60 (BP Location: Left Arm)   Pulse 96   Temp 99.4 F (37.4 C) (Oral)   Resp 20   SpO2 90%   Physical Exam Vitals and nursing note reviewed.  Constitutional:      General: She is not in acute distress.    Appearance: Normal appearance. She is well-developed. She is not ill-appearing or toxic-appearing.  HENT:     Head: Normocephalic and atraumatic.  Cardiovascular:     Rate and Rhythm: Normal rate.  Pulmonary:     Effort: Pulmonary effort is normal. No respiratory distress.     Breath sounds: No stridor. No wheezing.  Chest:     Chest wall: No tenderness.  Musculoskeletal:     Left shoulder: Tenderness and crepitus present. No swelling, deformity or bony tenderness. Decreased range of motion. Normal strength. Normal pulse.  Skin:    General: Skin is warm and  dry.  Neurological:     General: No focal deficit present.     Mental Status: She is alert and oriented to person, place, and time.  Psychiatric:        Mood and Affect: Mood normal.        Behavior: Behavior normal.     Procedures  No results found for this or any previous visit (from the past 24 hours).  Assessment and Plan :     Discharge Instructions       1. Chronic left shoulder pain (Primary) - Apply Sling & Swathe to left shoulder for immobilization and  protection from further injury. - Acetaminophen  500 MG PACK; Take 1,000 mg by mouth 3 (three) times daily as needed.  Dispense: 90 each; Refill: 1 - If regular acetaminophen  500 mg is not effective you may try Tylenol  extra strength rapid release to see if that is more effective. - Follow-up on 03/11/2024 for scheduled mammogram to rule out any other pathology is causing left-sided axillary/shoulder pain.  -Continue to monitor symptoms for any change in severity if there is any escalation of current symptoms or development of new symptoms follow-up in ER for further evaluation and management.      Monya Kozakiewicz B Visente Kirker    [1] No current facility-administered medications for this encounter.  Current Outpatient Medications:    Acetaminophen  500 MG PACK, Take 1,000 mg by mouth 3 (three) times daily as needed., Disp: 90 each, Rfl: 1   metFORMIN (GLUCOPHAGE-XR) 500 MG 24 hr tablet, Oral, Disp: , Rfl:    valsartan (DIOVAN) 160 MG tablet, Valsartan 160 mg tablet, Disp: , Rfl:    alendronate  (FOSAMAX ) 70 MG tablet, TAKE 1 TABLET EVERY WEDNESDAY WITH A FULL GLASS OF WATER ON AN EMPTY STOMACH, Disp: 12 tablet, Rfl: 3   ARTIFICIAL TEAR OP, Place 1 drop into both eyes 2 (two) times daily as needed (dry eyes)., Disp: , Rfl:    aspirin  EC 81 MG tablet, Take 81 mg by mouth daily., Disp: , Rfl:    Blood Glucose Monitoring Suppl (FREESTYLE FREEDOM LITE) w/Device KIT, Use to check blood sugar 3 times a day. Dx code e11.65, Disp: 1 kit, Rfl: 3   budesonide  (PULMICORT ) 0.25 MG/2ML nebulizer solution, Take 2 mLs (0.25 mg total) by nebulization in the morning and at bedtime., Disp: 60 mL, Rfl: 11   diclofenac  Sodium (VOLTAREN ) 1 % GEL, Apply 4 g topically 4 (four) times daily. prn, Disp: 150 g, Rfl: 1   FERROUS SULFATE  PO, Take 2 tablets by mouth daily. gummy, Disp: , Rfl:    formoterol  (PERFOROMIST ) 20 MCG/2ML nebulizer solution, Take 2 mLs (20 mcg total) by nebulization 2 (two) times daily., Disp: 60 mL, Rfl: 11    ipratropium-albuterol  (DUONEB) 0.5-2.5 (3) MG/3ML SOLN, Take 3 mLs by nebulization every 6 (six) hours as needed., Disp: 360 mL, Rfl: 11   mupirocin  ointment (BACTROBAN ) 2 %, Apply 1 Application topically 2 (two) times daily., Disp: 22 g, Rfl: 0   pravastatin  (PRAVACHOL ) 40 MG tablet, TAKE 1 TABLET DAILY, Disp: 90 tablet, Rfl: 3   Spacer/Aero-Holding Chambers (AEROCHAMBER PLUS) inhaler, Use as instructed, Disp: 1 each, Rfl: 2   telmisartan  (MICARDIS ) 40 MG tablet, Take 1 tablet (40 mg total) by mouth daily., Disp: 90 tablet, Rfl: 3   triamcinolone  cream (KENALOG ) 0.1 %, Apply 1 Application topically 2 (two) times daily., Disp: 30 g, Rfl: 0 [2]  Allergies Allergen Reactions   Bactrim [Sulfamethoxazole-Trimethoprim] Swelling    Marked angioedema requiring hospitalization  Penicillins Other (See Comments)    Has patient had a PCN reaction causing immediate rash, facial/tongue/throat swelling, SOB or lightheadedness with hypotension: Unk Has patient had a PCN reaction causing severe rash involving mucus membranes or skin necrosis: Unk Has patient had a PCN reaction that required hospitalization: Unk Has patient had a PCN reaction occurring within the last 10 years: No If all of the above answers are NO, then may proceed with Cephalosporin use.  [3]  Social History Tobacco Use   Smoking status: Former    Current packs/day: 0.00    Average packs/day: 0.5 packs/day for 71.0 years (35.5 ttl pk-yrs)    Types: Cigarettes    Start date: 03/08/1947    Quit date: 2020    Years since quitting: 5.9   Smokeless tobacco: Never   Tobacco comments:    has tried cold turkey  Vaping Use   Vaping status: Never Used  Substance Use Topics   Alcohol use: No    Alcohol/week: 0.0 standard drinks of alcohol   Drug use: No     Zaveon Gillen, Ethel B, NP 03/04/24 1324  "

## 2024-03-04 NOTE — Discharge Instructions (Addendum)
" °  1. Chronic left shoulder pain (Primary) - Apply Sling & Swathe to left shoulder for immobilization and protection from further injury. - Acetaminophen  500 MG PACK; Take 1,000 mg by mouth 3 (three) times daily as needed.  Dispense: 90 each; Refill: 1 - If regular acetaminophen  500 mg is not effective you may try Tylenol  extra strength rapid release to see if that is more effective. - Follow-up on 03/11/2024 for scheduled mammogram to rule out any other pathology is causing left-sided axillary/shoulder pain.  -Continue to monitor symptoms for any change in severity if there is any escalation of current symptoms or development of new symptoms follow-up in ER for further evaluation and management. "

## 2024-03-11 LAB — HM MAMMOGRAPHY

## 2024-03-13 ENCOUNTER — Encounter: Payer: Self-pay | Admitting: Internal Medicine

## 2024-03-20 ENCOUNTER — Encounter: Payer: Self-pay | Admitting: Internal Medicine

## 2024-03-20 ENCOUNTER — Ambulatory Visit: Payer: Self-pay | Admitting: Internal Medicine

## 2024-03-20 VITALS — BP 118/80 | HR 87 | Temp 98.3°F | Ht 63.0 in | Wt 159.4 lb

## 2024-03-20 DIAGNOSIS — I1310 Hypertensive heart and chronic kidney disease without heart failure, with stage 1 through stage 4 chronic kidney disease, or unspecified chronic kidney disease: Secondary | ICD-10-CM

## 2024-03-20 DIAGNOSIS — N1831 Chronic kidney disease, stage 3a: Secondary | ICD-10-CM

## 2024-03-20 DIAGNOSIS — E1122 Type 2 diabetes mellitus with diabetic chronic kidney disease: Secondary | ICD-10-CM | POA: Diagnosis not present

## 2024-03-20 DIAGNOSIS — M19012 Primary osteoarthritis, left shoulder: Secondary | ICD-10-CM | POA: Diagnosis not present

## 2024-03-20 DIAGNOSIS — J439 Emphysema, unspecified: Secondary | ICD-10-CM

## 2024-03-20 DIAGNOSIS — E1142 Type 2 diabetes mellitus with diabetic polyneuropathy: Secondary | ICD-10-CM

## 2024-03-20 DIAGNOSIS — R079 Chest pain, unspecified: Secondary | ICD-10-CM | POA: Diagnosis not present

## 2024-03-20 DIAGNOSIS — J4489 Other specified chronic obstructive pulmonary disease: Secondary | ICD-10-CM | POA: Diagnosis not present

## 2024-03-20 DIAGNOSIS — R413 Other amnesia: Secondary | ICD-10-CM | POA: Diagnosis not present

## 2024-03-20 DIAGNOSIS — I130 Hypertensive heart and chronic kidney disease with heart failure and stage 1 through stage 4 chronic kidney disease, or unspecified chronic kidney disease: Secondary | ICD-10-CM

## 2024-03-20 NOTE — Patient Instructions (Signed)
 Acute Back Pain, Adult Acute back pain is sudden and usually short-lived. It is often caused by an injury to the muscles and tissues in the back. The injury may result from: A muscle, tendon, or ligament getting overstretched or torn. Ligaments are tissues that connect bones to each other. Lifting something improperly can cause a back strain. Wear and tear (degeneration) of the spinal disks. Spinal disks are circular tissue that provide cushioning between the bones of the spine (vertebrae). Twisting motions, such as while playing sports or doing yard work. A hit to the back. Arthritis. You may have a physical exam, lab tests, and imaging tests to find the cause of your pain. Acute back pain usually goes away with rest and home care. Follow these instructions at home: Managing pain, stiffness, and swelling Take over-the-counter and prescription medicines only as told by your health care provider. Treatment may include medicines for pain and inflammation that are taken by mouth or applied to the skin, or muscle relaxants. Your health care provider may recommend applying ice during the first 24-48 hours after your pain starts. To do this: Put ice in a plastic bag. Place a towel between your skin and the bag. Leave the ice on for 20 minutes, 2-3 times a day. Remove the ice if your skin turns bright red. This is very important. If you cannot feel pain, heat, or cold, you have a greater risk of damage to the area. If directed, apply heat to the affected area as often as told by your health care provider. Use the heat source that your health care provider recommends, such as a moist heat pack or a heating pad. Place a towel between your skin and the heat source. Leave the heat on for 20-30 minutes. Remove the heat if your skin turns bright red. This is especially important if you are unable to feel pain, heat, or cold. You have a greater risk of getting burned. Activity  Do not stay in bed. Staying in  bed for more than 1-2 days can delay your recovery. Sit up and stand up straight. Avoid leaning forward when you sit or hunching over when you stand. If you work at a desk, sit close to it so you do not need to lean over. Keep your chin tucked in. Keep your neck drawn back, and keep your elbows bent at a 90-degree angle (right angle). Sit high and close to the steering wheel when you drive. Add lower back (lumbar) support to your car seat, if needed. Take short walks on even surfaces as soon as you are able. Try to increase the length of time you walk each day. Do not sit, drive, or stand in one place for more than 30 minutes at a time. Sitting or standing for long periods of time can put stress on your back. Do not drive or use heavy machinery while taking prescription pain medicine. Use proper lifting techniques. When you bend and lift, use positions that put less stress on your back: [place] your knees. Keep the load close to your body. Avoid twisting. Exercise regularly as told by your health care provider. Exercising helps your back heal faster and helps prevent back injuries by keeping muscles strong and flexible. Work with a physical therapist to make a safe exercise program, as recommended by your health care provider. Do any exercises as told by your physical therapist. Lifestyle Maintain a healthy weight. Extra weight puts stress on your back and makes it difficult to have good  posture. Avoid activities or situations that make you feel anxious or stressed. Stress and anxiety increase muscle tension and can make back pain worse. Learn ways to manage anxiety and stress, such as through exercise. General instructions Sleep on a firm mattress in a comfortable position. Try lying on your side with your knees slightly bent. If you lie on your back, put a pillow under your knees. Keep your head and neck in a straight line with your spine (neutral position) when using electronic equipment like  smartphones or pads. To do this: Raise your smartphone or pad to look at it instead of bending your head or neck to look down. Put the smartphone or pad at the level of your face while looking at the screen. Follow your treatment plan as told by your health care provider. This may include: Cognitive or behavioral therapy. Acupuncture or massage therapy. Meditation or yoga. Contact a health care provider if: You have pain that is not relieved with rest or medicine. You have increasing pain going down into your legs or buttocks. Your pain does not improve after 2 weeks. You have pain at night. You lose weight without trying. You have a fever or chills. You develop nausea or vomiting. You develop abdominal pain. Get help right away if: You develop new bowel or bladder control problems. You have unusual weakness or numbness in your arms or legs. You feel faint. These symptoms may represent a serious problem that is an emergency. Do not wait to see if the symptoms will go away. Get medical help right away. Call your local emergency services (911 in the [place] Do not drive yourself to the hospital. Summary Acute back pain is sudden and usually short-lived. Use proper lifting techniques. When you bend and lift, use positions that put less stress on your back. Take over-the-counter and prescription medicines only as told by your health care provider, and apply heat or ice as told. This information is not intended to replace advice given to you by your health care provider. Make sure you discuss any questions you have with your health care provider. Document Revised: 05/15/2020 Document Reviewed: 05/15/2020 Elsevier Patient Education  2024 [employer].

## 2024-03-20 NOTE — Progress Notes (Signed)
 I,Leah Wiggins, CMA,acting as a neurosurgeon for Leah LOISE Slocumb, MD.,have documented all relevant documentation on the behalf of Leah LOISE Slocumb, MD,as directed by  Leah LOISE Slocumb, MD while in the presence of Leah LOISE Slocumb, MD.  Subjective:  Patient ID: Leah Wiggins , female    DOB: [birth year] , 89 y.o.   MRN: [id number]  Chief Complaint  Patient presents with   Back Pain    Patient presents today for pain on the left side of her breast & underneath. With certain ranges of motion like reaching or bending the pain intensifies.  She noticed this gets bad on / off.   She is est with a orthopedic at [facility].     HPI Discussed the use of AI scribe software for clinical note transcription with the patient, who gave verbal consent to proceed.  History of Present Illness Leah Wiggins is a 89 year old female with severe arthritis who presents with left shoulder pain and limited mobility. She is accompanied by her daughter.  She experiences pain in her left shoulder, located under the breast and radiating around the area, with an inability to lift her arm, significantly impacting her daily activities. She has been using Voltaren  cream and taking Tylenol  for pain relief, but these measures have not been effective.  She has a history of a shoulder fracture. She recalls being told she may have had an old rib fracture in the past. A CT scan of the chest was performed in August.  She has a history of diabetes, with a previous A1c of 6.8. She also takes telmisartan  for blood pressure and pravastatin  for cholesterol management.  She agrees to have DM/HTN follow up today since another appt is within 2-3 weeks.   She reports a lack of appetite and has been experiencing balance issues. She is not currently taking her Alive vitamin, which contains B12, raising concern about potential B12 deficiency affecting her memory and balance.  She has a history of falls, including one at her  sister's house and another on a curb, but specific details about these incidents are unclear. She has also experienced epistaxis and melena in the past, which were evaluated in the emergency room.   Diabetes She presents for her follow-up diabetic visit. She has type 2 diabetes mellitus. Her disease course has been stable. Pertinent negatives for diabetes include no blurred vision. There are no hypoglycemic complications. Risk factors for coronary artery disease include diabetes mellitus, dyslipidemia, hypertension, obesity, sedentary lifestyle and post-menopausal.  Hypertension This is a chronic problem. The current episode started more than 1 year ago. The problem has been gradually improving since onset. The problem is controlled. Pertinent negatives include no blurred vision. The current treatment provides moderate improvement.     Past Medical History:  Diagnosis Date   Arthritis    shoulders (05/04/2017)   Breast cancer, right breast (HCC) 1991   GERD (gastroesophageal reflux disease)    Hard of hearing    Hyperlipidemia    Hypertension    Memory loss    Non-stress test nonreactive, 04/24/12, normal 12/10/2012   OSA on CPAP    don't wear it all the time (05/04/2017)   PVD (peripheral vascular disease)    Tobacco use 12/10/2012   Type II diabetes mellitus (HCC)      Family History  Problem Relation Age of Onset   Stroke Mother    Cancer Sister      Current Outpatient Medications:  Acetaminophen  500 MG PACK, Take 1,000 mg by mouth 3 (three) times daily as needed., Disp: 90 each, Rfl: 1   alendronate  (FOSAMAX ) 70 MG tablet, TAKE 1 TABLET EVERY WEDNESDAY WITH A FULL GLASS OF WATER ON AN EMPTY STOMACH, Disp: 12 tablet, Rfl: 3   ARTIFICIAL TEAR OP, Place 1 drop into both eyes 2 (two) times daily as needed (dry eyes)., Disp: , Rfl:    aspirin  EC 81 MG tablet, Take 81 mg by mouth daily., Disp: , Rfl:    Blood Glucose Monitoring Suppl (FREESTYLE FREEDOM LITE) w/Device KIT, Use to  check blood sugar 3 times a day. Dx code e11.65, Disp: 1 kit, Rfl: 3   budesonide  (PULMICORT ) 0.25 MG/2ML nebulizer solution, Take 2 mLs (0.25 mg total) by nebulization in the morning and at bedtime., Disp: 60 mL, Rfl: 11   diclofenac  Sodium (VOLTAREN ) 1 % GEL, Apply 4 g topically 4 (four) times daily. prn, Disp: 150 g, Rfl: 1   FERROUS SULFATE  PO, Take 2 tablets by mouth daily. gummy, Disp: , Rfl:    formoterol  (PERFOROMIST ) 20 MCG/2ML nebulizer solution, Take 2 mLs (20 mcg total) by nebulization 2 (two) times daily., Disp: 60 mL, Rfl: 11   ipratropium-albuterol  (DUONEB) 0.5-2.5 (3) MG/3ML SOLN, Take 3 mLs by nebulization every 6 (six) hours as needed., Disp: 360 mL, Rfl: 11   mupirocin  ointment (BACTROBAN ) 2 %, Apply 1 Application topically 2 (two) times daily., Disp: 22 g, Rfl: 0   pravastatin  (PRAVACHOL ) 40 MG tablet, TAKE 1 TABLET DAILY, Disp: 90 tablet, Rfl: 3   Spacer/Aero-Holding Chambers (AEROCHAMBER PLUS) inhaler, Use as instructed, Disp: 1 each, Rfl: 2   telmisartan  (MICARDIS ) 40 MG tablet, Take 1 tablet (40 mg total) by mouth daily., Disp: 90 tablet, Rfl: 3   triamcinolone  cream (KENALOG ) 0.1 %, Apply 1 Application topically 2 (two) times daily., Disp: 30 g, Rfl: 0   Allergies  Allergen Reactions   Bactrim [Sulfamethoxazole-Trimethoprim] Swelling    Marked angioedema requiring hospitalization   Penicillins Other (See Comments)    Has patient had a PCN reaction causing immediate rash, facial/tongue/throat swelling, SOB or lightheadedness with hypotension: Unk Has patient had a PCN reaction causing severe rash involving mucus membranes or skin necrosis: Unk Has patient had a PCN reaction that required hospitalization: Unk Has patient had a PCN reaction occurring within the last 10 years: No If all of the above answers are NO, then may proceed with Cephalosporin use.     Review of Systems  Constitutional: Negative.   Eyes:  Negative for blurred vision.  Respiratory: Negative.     Cardiovascular: Negative.   Gastrointestinal: Negative.   Musculoskeletal:  Positive for arthralgias.  Neurological: Negative.   Psychiatric/Behavioral: Negative.       Today's Vitals   03/20/24 1448  BP: 118/80  Pulse: 87  Temp: 98.3 F (36.8 C)  SpO2: 98%  Weight: 159 lb 6.4 oz (72.3 kg)  Height: 5' 3 (1.6 m)   Body mass index is 28.24 kg/m.  Wt Readings from Last 3 Encounters:  03/20/24 159 lb 6.4 oz (72.3 kg)  10/12/23 164 lb 3.2 oz (74.5 kg)  09/15/23 166 lb 14.2 oz (75.7 kg)    The ASCVD Risk score (Arnett DK, et al., 2019) failed to calculate for the following reasons:   The 2019 ASCVD risk score is only valid for ages 46 to 78   * - Cholesterol units were assumed  Objective:  Physical Exam Vitals and nursing note reviewed.  Constitutional:  Appearance: Normal appearance.  HENT:     Head: Normocephalic and atraumatic.  Eyes:     Extraocular Movements: Extraocular movements intact.  Cardiovascular:     Rate and Rhythm: Normal rate and regular rhythm.     Heart sounds: Normal heart sounds.  Pulmonary:     Effort: Pulmonary effort is normal.     Comments: Decreased breath sounds b/l bases Musculoskeletal:     Cervical back: Normal range of motion.     Comments: Decreased ROM with L shoulder  Skin:    General: Skin is warm.  Neurological:     General: No focal deficit present.     Mental Status: She is alert.  Psychiatric:        Mood and Affect: Mood normal.        Behavior: Behavior normal.         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 Possible rotator cuff involvement. Previous shoulder fracture. CT confirms severe arthritis. - Referred to orthopedic specialist for evaluation and possible steroid injection. - Continue Voltaren  cream and arthritis Tylenol . - Consider Salonpas patches for additional pain relief. -  Encouraged use of heating pad for symptomatic relief. Left-sided chest pain Exacerbated with palpation of affected area. Exacerbated by positional changes.  Appears to be musculoskeletal in nature.  - CT shows old rib fractures - Consider use of lidocaine patches Hypertensive heart and renal disease with renal failure, stage 1 through stage 4 or unspecified chronic kidney disease, without heart failure Chronic, fair control. She will continue with telmisartan  40mg  daily.  - She is reminded to follow a low sodium diet.  - She will f/u in four to six months for re-evaluation.  Diabetic peripheral neuropathy associated with type 2 diabetes mellitus (HCC) Type 2 diabetes with last A1c of 6.8. Discussed potential need for medication if A1c exceeds 7. Emphasized exercise and dietary management. Discussed metformin's impact on B12 absorption. - Ordered A1c test today. - Encouraged regular exercise, focusing on the right arm and legs. - Discussed dietary modifications to improve glycemic control. Type 2 diabetes mellitus with stage 3a chronic kidney disease, without long-term current use of insulin  (HCC) Chronic, she is not on any meds at this time. She agrees to take meds if A1c goes above 7.0.  - Regarding her renal function, encouraged to avoid NSAIDs, stay hydrated and to keep BP well controlled to decrease risk of CKD progression.  COPD with chronic bronchitis and emphysema (HCC) Continues inhaler use as prescribed by pulmonologist. - Continue current inhaler regimen as prescribed by pulmonologist. Memory change Reports memory changes and lack of appetite. Possible B12 deficiency affecting memory and balance. Not taking Alive vitamin with B12. Discussed metformin's impact on B12 absorption. - Ordered B12 level test. - Consider B12 injections if levels are low. - Encouraged intake of Alive vitamin with B12.    Orders Placed This Encounter  Procedures   TSH   CMP14+EGFR   Hemoglobin A1c    PTH, intact and calcium    Phosphorus   Protein electrophoresis, serum   Vitamin B12   Ambulatory referral to Orthopedic Surgery     Return Pls move Feb 2026 appt to April/May 2026 - dm check.  Patient was given opportunity to ask questions. Patient verbalized understanding of the plan and was able to repeat key elements of the plan. All questions were answered to their satisfaction.  I, Leah LOISE Slocumb, MD, have reviewed all documentation for this visit. The documentation on 03/20/2024 for the exam, diagnosis, procedures, and orders are all accurate and complete.   IF YOU HAVE BEEN REFERRED TO A SPECIALIST, IT MAY TAKE 1-2 WEEKS TO SCHEDULE/PROCESS THE REFERRAL. IF YOU HAVE NOT HEARD FROM US /SPECIALIST IN TWO WEEKS, PLEASE GIVE US  A CALL AT [phone].

## 2024-03-22 LAB — PROTEIN ELECTROPHORESIS, SERUM
A/G Ratio: 0.7 (ref 0.7–1.7)
Albumin ELP: 2.8 g/dL — ABNORMAL LOW (ref 2.9–4.4)
Alpha 1: 0.4 g/dL (ref 0.0–0.4)
Alpha 2: 1.4 g/dL — ABNORMAL HIGH (ref 0.4–1.0)
Beta: 1.2 g/dL (ref 0.7–1.3)
Gamma Globulin: 0.9 g/dL (ref 0.4–1.8)
Globulin, Total: 3.8 g/dL (ref 2.2–3.9)

## 2024-03-22 LAB — CMP14+EGFR
ALT: 13 IU/L (ref 0–32)
AST: 15 IU/L (ref 0–40)
Albumin: 3.4 g/dL — ABNORMAL LOW (ref 3.6–4.6)
Alkaline Phosphatase: 101 IU/L (ref 48–129)
BUN/Creatinine Ratio: 13 (ref 12–28)
BUN: 17 mg/dL (ref 10–36)
Bilirubin Total: <0.2 mg/dL (ref 0.0–1.2)
CO2: 23 mmol/L (ref 20–29)
Calcium: 9.2 mg/dL (ref 8.7–10.3)
Chloride: 104 mmol/L (ref 96–106)
Creatinine, Ser: 1.27 mg/dL — ABNORMAL HIGH (ref 0.57–1.00)
Globulin, Total: 3.2 g/dL (ref 1.5–4.5)
Glucose: 109 mg/dL — ABNORMAL HIGH (ref 70–99)
Potassium: 4.4 mmol/L (ref 3.5–5.2)
Sodium: 140 mmol/L (ref 134–144)
Total Protein: 6.6 g/dL (ref 6.0–8.5)
eGFR: 39 mL/min/1.73 — ABNORMAL LOW

## 2024-03-22 LAB — HEMOGLOBIN A1C
Est. average glucose Bld gHb Est-mCnc: 163 mg/dL
Hgb A1c MFr Bld: 7.3 % — ABNORMAL HIGH (ref 4.8–5.6)

## 2024-03-22 LAB — TSH: TSH: 1.58 u[IU]/mL (ref 0.450–4.500)

## 2024-03-22 LAB — VITAMIN B12: Vitamin B-12: 914 pg/mL (ref 232–1245)

## 2024-03-22 LAB — PTH, INTACT AND CALCIUM: PTH: 25 pg/mL (ref 15–65)

## 2024-03-22 LAB — PHOSPHORUS: Phosphorus: 3.5 mg/dL (ref 3.0–4.3)

## 2024-03-24 ENCOUNTER — Ambulatory Visit: Payer: Self-pay | Admitting: Internal Medicine

## 2024-03-26 ENCOUNTER — Other Ambulatory Visit: Payer: Self-pay

## 2024-03-26 ENCOUNTER — Other Ambulatory Visit

## 2024-03-26 ENCOUNTER — Ambulatory Visit (INDEPENDENT_AMBULATORY_CARE_PROVIDER_SITE_OTHER): Admitting: Surgical

## 2024-03-26 DIAGNOSIS — M25512 Pain in left shoulder: Secondary | ICD-10-CM

## 2024-03-26 DIAGNOSIS — M19012 Primary osteoarthritis, left shoulder: Secondary | ICD-10-CM | POA: Diagnosis not present

## 2024-03-27 ENCOUNTER — Encounter: Payer: Self-pay | Admitting: Surgical

## 2024-03-27 MED ORDER — BUPIVACAINE HCL 0.25 % IJ SOLN
9.0000 mL | INTRAMUSCULAR | Status: AC | PRN
  Administered 2024-03-26: 9 mL via INTRA_ARTICULAR

## 2024-03-27 MED ORDER — METHYLPREDNISOLONE ACETATE 40 MG/ML IJ SUSP
40.0000 mg | INTRAMUSCULAR | Status: AC | PRN
  Administered 2024-03-26: 40 mg via INTRA_ARTICULAR

## 2024-03-27 NOTE — Progress Notes (Signed)
 "  Office Visit Note   Patient: Leah Wiggins           Date of Birth: 1926/05/11           MRN: [id number] Visit Date: 03/26/2024 Requested by: Jarold Medici, MD [address] [place],  [place] [address] PCP: Jarold Medici, MD  Subjective: Chief Complaint  Patient presents with   Left Shoulder - Pain    HPI: Leah Wiggins is a 89 y.o. female who presents to the office reporting left shoulder pain.  Patient states that she has had pain for years in her left shoulder.  She describes pain throughout the entirety of the shoulder that radiates down into her chest and rib cage.  She has had several falls over the last several years.  This did result in a right shoulder fracture but her right shoulder is doing pretty well since the fracture in 2020.  No history of prior left shoulder fracture that she has had documented.  She has been to [facility] for injections in the shoulder.  She is right-hand dominant.  She states that pain waxes and wanes in intensity but is always constant to some extent.  She has had multiple rib fractures as diagnosed on recent CT scan and these are more on the left where some of her rib cage pain is.  Pain will wake her up at night in terms of her shoulder pain.  She has decreased motion of the left shoulder relative to the right.  She has difficulty lifting objects or reaching out away from her body with the left arm.  She has a lot of grinding and catching sensation..                ROS: All systems reviewed are negative as they relate to the chief complaint within the history of present illness.  Patient denies fevers or chills.  Assessment & Plan: Visit Diagnoses:  1. Glenohumeral arthritis, left   2. Left shoulder pain, unspecified chronicity     Plan: Impression is 89 year old female with end-stage shoulder arthritis that has resulted in significant loss of motion and severe pain.  She has had prior injections before at [facility] and these have provided her good relief.  She and her daughter would like to try this today.  Leah Wiggins actually seems more concerned about her rib cage pain; she has had multiple falls over the years and her last CT scan of her chest in August 2025 did demonstrate left-sided rib fractures of the 3rd and 4th ribs which could be contributing to some of this pain though most of her pain is actually more inferior in the rib cage.  We discussed options for this type of pain including topical medication such as lidocaine patches (which historically has worked the 1 time she tried it) versus trying physical therapy for this problem.  She and her daughter will try some lidocaine patches and see if this helps with the rib pain and if no improvement, they are welcome to reach out for further discussion.  Regarding the left shoulder, glenohumeral joint was identified under ultrasound guidance and injection was delivered successfully.  Patient tolerated procedure well without complication.  We will see her back as needed and the next time that we can repeat injection would be 3 to 4 months for now.    Follow-Up Instructions: No follow-ups on file.   Orders:  Orders Placed This Encounter  Procedures   XR Shoulder  Left   US  Guided Needle Placement - No Linked Charges   No orders of the defined types were placed in this encounter.     Procedures: Large Joint Inj: L glenohumeral on 03/26/2024 12:10 PM Indications: pain and diagnostic evaluation Details: 22 G 3.5 in needle, ultrasound-guided posterior approach Medications: 9 mL bupivacaine  0.25 %; 40 mg methylPREDNISolone  acetate 40 MG/ML Outcome: tolerated well, no immediate complications Procedure, treatment alternatives, risks and benefits explained, specific risks discussed. Consent was given by the patient. Immediately prior to procedure a time out was called to verify the correct patient, procedure, equipment, support staff and site/side marked as  required. Patient was prepped and draped in the usual sterile fashion.       Clinical Data: No additional findings.  Objective: Vital Signs: There were no vitals taken for this visit.  Physical Exam:  Constitutional: Patient appears well-developed HEENT:  Head: Normocephalic Eyes:EOM are normal Neck: Normal range of motion Cardiovascular: Normal rate Pulmonary/chest: Effort normal Neurologic: Patient is alert Skin: Skin is warm Psychiatric: Patient has normal mood and affect  Ortho Exam: Ortho exam demonstrates left shoulder with significantly reduced range of motion with about 5 degrees external rotation, 70 degrees abduction, 110 degrees forward elevation passively.  This is substantially reduced compared with the right shoulder.  There is grinding and gear shifting sensation noted with passive motion of the left shoulder.  Has pretty good external rotation and internal rotation strength of the shoulder as well as intact deltoid with axillary nerve intact.  Deltoid is firing on exam.  2+ radial pulse of the left upper extremity.  No cellulitis or skin changes noted over the left shoulder region.  She does have some tenderness throughout the left rib cage diffusely but primarily through the bottom of the rib cage.  Specialty Comments:  No specialty comments available.  Imaging: No results found.   PMFS History: Patient Active Problem List   Diagnosis Date Noted   Diabetic peripheral neuropathy associated with type 2 diabetes mellitus (HCC) 03/20/2024   Bilateral impacted cerumen 10/22/2023   Nonintractable episodic headache 06/08/2023   Chronic left shoulder pain 02/19/2023   Epistaxis 02/13/2023   Mastodynia of left breast 02/13/2023   Leukocytes in urine 10/10/2022   Mass of left lung 02/21/2022   Gait abnormality 02/21/2022   Overweight with body mass index (BMI) of 29 to 29.9 in adult 02/21/2022   Cancer of upper lobe of left lung (HCC) 01/26/2022   Hypertensive  heart and chronic kidney disease with heart failure and stage 1 through stage 4 chronic kidney disease, or unspecified chronic kidney disease (HCC) 09/03/2020   Type 2 diabetes mellitus with stage 3 chronic kidney disease (HCC) 09/03/2020   Aortic atherosclerosis 09/03/2020   Absolute anemia 09/03/2020   Cellulitis of left foot 09/03/2020   Class 1 obesity due to excess calories with serious comorbidity and body mass index (BMI) of 30.0 to 30.9 in adult 09/03/2020   Angiodysplasia of intestine 06/04/2020   Encounter for general adult medical examination w/o abnormal findings 06/04/2020   Gastroesophageal reflux disease 06/04/2020   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 hearing of right ear 06/04/2020   Prolapsed internal hemorrhoids 06/04/2020   Rectal bleeding 06/04/2020   Sensorineural hearing loss (SNHL) of right ear with restricted hearing of left ear 06/04/2020   Mild cognitive impairment 12/23/2019   Memory loss 06/24/2019   Intrinsic atopic dermatitis 09/18/2018   COPD with chronic bronchitis and emphysema (HCC) 04/06/2018  Prediabetes 02/07/2018   Iron deficiency anemia due to chronic blood loss 12/25/2017   Anaphylaxis 05/04/2017   Rash and nonspecific skin eruption 05/04/2017   History of breast cancer 08/14/2014   Empty sella 07/14/2014   Pain in the chest 04/28/2014   Diabetes mellitus without complication (HCC) 03/03/2014   Chest pain 03/03/2014   Essential hypertension 06/24/2013   PAD (peripheral artery disease), abnormal ABIs 12/10/2012   Non-stress test nonreactive, 04/24/12, normal 12/10/2012   Dyslipidemia 12/10/2012   Tobacco use 12/10/2012   Thrombocytopenia 01/24/2012   RESTLESS LEG SYNDROME 02/16/2010   METHICILLIN SUSECPTIBLE PNEUMONIA STAPH AUREUS 01/13/2010   HIP THI LEG&ANK ABRASION/FRICION BURN W/O INF 01/13/2010   Past Medical History:  Diagnosis Date   Arthritis    shoulders (05/04/2017)   Breast cancer,  right breast (HCC) 1991   GERD (gastroesophageal reflux disease)    Hard of hearing    Hyperlipidemia    Hypertension    Memory loss    Non-stress test nonreactive, 04/24/12, normal 12/10/2012   OSA on CPAP    don't wear it all the time (05/04/2017)   PVD (peripheral vascular disease)    Tobacco use 12/10/2012   Type II diabetes mellitus (HCC)     Family History  Problem Relation Age of Onset   Stroke Mother    Cancer Sister     Past Surgical History:  Procedure Laterality Date   APPENDECTOMY     BIOPSY  01/19/2018   Procedure: BIOPSY;  Surgeon: Rollin Dover, MD;  Location: [facility];  Service: Endoscopy;;   BREAST BIOPSY Right 1991   CARDIOVASCULAR STRESS TEST  04/24/2012   No significant ST segment change suggestive of ischemia.   CATARACT EXTRACTION, BILATERAL Bilateral    COLONOSCOPY WITH PROPOFOL  N/A 01/19/2018   Procedure: COLONOSCOPY WITH PROPOFOL ;  Surgeon: Rollin Dover, MD;  Location: [facility];  Service: Endoscopy;  Laterality: N/A;   ESOPHAGOGASTRODUODENOSCOPY (EGD) WITH PROPOFOL  N/A 01/19/2018   Procedure: ESOPHAGOGASTRODUODENOSCOPY (EGD) WITH PROPOFOL ;  Surgeon: Rollin Dover, MD;  Location: [facility];  Service: Endoscopy;  Laterality: N/A;   JOINT REPLACEMENT     LOWER EXTREMITY ARTERIAL DOPPLER Bilateral 05/07/2012   Left ABI-demonstrated mild arterial insufficiency. Right CIA 50-69% diameter reduction. Right SFA less than 50% diameter reduction. Left SFA 70-99% diameter reduction. Bilateral Runoff-Posterior tibial arteries appeared occluded.   MASTECTOMY PARTIAL / LUMPECTOMY W/ AXILLARY LYMPHADENECTOMY Right 1991    TOTAL HIP ARTHROPLASTY Right 2011   Social History   Occupational History   Occupation: retired  Tobacco Use   Smoking status: Former    Current packs/day: 0.00    Average packs/day: 0.5 packs/day for 71.0 years (35.5 ttl pk-yrs)    Types: Cigarettes    Start date: 03/08/1947    Quit date: 2020    Years since quitting: 6.0   Smokeless  tobacco: Never   Tobacco comments:    has tried cold turkey  Vaping Use   Vaping status: Never Used  Substance and Sexual Activity   Alcohol use: No    Alcohol/week: 0.0 standard drinks of alcohol   Drug use: No   Sexual activity: Not Currently    Partners: Male    Birth control/protection: Post-menopausal        "

## 2024-03-31 ENCOUNTER — Encounter: Payer: Self-pay | Admitting: Internal Medicine

## 2024-03-31 NOTE — Assessment & Plan Note (Signed)
 Continues inhaler use as prescribed by pulmonologist. - Continue current inhaler regimen as prescribed by pulmonologist.

## 2024-03-31 NOTE — Assessment & Plan Note (Signed)
 Type 2 diabetes with last A1c of 6.8. Discussed potential need for medication if A1c exceeds 7. Emphasized exercise and dietary management. Discussed metformin's impact on B12 absorption. - Ordered A1c test today. - Encouraged regular exercise, focusing on the right arm and legs. - Discussed dietary modifications to improve glycemic control.

## 2024-03-31 NOTE — Assessment & Plan Note (Signed)
 Chronic, she is not on any meds at this time. She agrees to take meds if A1c goes above 7.0.  - Regarding her renal function, encouraged to avoid NSAIDs, stay hydrated and to keep BP well controlled to decrease risk of CKD progression.

## 2024-03-31 NOTE — Assessment & Plan Note (Signed)
 Chronic, fair control. She will continue with telmisartan  40mg  daily.  - She is reminded to follow a low sodium diet.  - She will f/u in four to six months for re-evaluation.

## 2024-04-15 ENCOUNTER — Ambulatory Visit: Payer: Self-pay | Admitting: Internal Medicine

## 2024-04-30 ENCOUNTER — Ambulatory Visit: Admitting: Internal Medicine

## 2024-05-20 ENCOUNTER — Ambulatory Visit: Admitting: Cardiovascular Disease

## 2024-06-12 ENCOUNTER — Ambulatory Visit: Payer: Self-pay

## 2024-07-24 ENCOUNTER — Ambulatory Visit: Admitting: Internal Medicine

## 2024-10-14 ENCOUNTER — Encounter: Payer: Self-pay | Admitting: Internal Medicine

## 2024-10-16 ENCOUNTER — Other Ambulatory Visit (HOSPITAL_COMMUNITY)

## 2024-10-21 ENCOUNTER — Ambulatory Visit: Payer: Self-pay | Admitting: Radiation Oncology

## 2024-12-06 ENCOUNTER — Ambulatory Visit: Admitting: Oncology

## 2024-12-06 ENCOUNTER — Other Ambulatory Visit
